# Patient Record
Sex: Female | Born: 1985 | Race: Black or African American | Hispanic: No | Marital: Single | Smoking: Never smoker
Health system: Southern US, Community
[De-identification: ages and names within clinical notes are randomized; demographics above are authoritative.]

## PROBLEM LIST (undated history)

## (undated) DIAGNOSIS — N926 Irregular menstruation, unspecified: Secondary | ICD-10-CM

## (undated) DIAGNOSIS — B009 Herpesviral infection, unspecified: Secondary | ICD-10-CM

## (undated) DIAGNOSIS — G43909 Migraine, unspecified, not intractable, without status migrainosus: Secondary | ICD-10-CM

## (undated) DIAGNOSIS — Z30017 Encounter for initial prescription of implantable subdermal contraceptive: Principal | ICD-10-CM

## (undated) DIAGNOSIS — Z789 Other specified health status: Secondary | ICD-10-CM

## (undated) DIAGNOSIS — I10 Essential (primary) hypertension: Secondary | ICD-10-CM

## (undated) DIAGNOSIS — I509 Heart failure, unspecified: Secondary | ICD-10-CM

## (undated) DIAGNOSIS — E559 Vitamin D deficiency, unspecified: Secondary | ICD-10-CM

## (undated) HISTORY — PX: NO PAST SURGERIES: SHX2092

## (undated) HISTORY — DX: Essential (primary) hypertension: I10

## (undated) HISTORY — DX: Irregular menstruation, unspecified: N92.6

## (undated) HISTORY — DX: Other specified health status: Z78.9

## (undated) HISTORY — DX: Herpesviral infection, unspecified: B00.9

## (undated) HISTORY — DX: Encounter for initial prescription of implantable subdermal contraceptive: Z30.017

## (undated) HISTORY — DX: Vitamin D deficiency, unspecified: E55.9

## (undated) HISTORY — DX: Migraine, unspecified, not intractable, without status migrainosus: G43.909

---

## 2007-05-31 ENCOUNTER — Emergency Department (HOSPITAL_COMMUNITY): Admission: EM | Admit: 2007-05-31 | Discharge: 2007-05-31 | Payer: Self-pay | Admitting: Emergency Medicine

## 2008-03-19 ENCOUNTER — Emergency Department (HOSPITAL_COMMUNITY): Admission: EM | Admit: 2008-03-19 | Discharge: 2008-03-19 | Payer: Self-pay | Admitting: Emergency Medicine

## 2008-04-08 ENCOUNTER — Other Ambulatory Visit: Admission: RE | Admit: 2008-04-08 | Discharge: 2008-04-08 | Payer: Self-pay | Admitting: Obstetrics and Gynecology

## 2008-06-01 ENCOUNTER — Inpatient Hospital Stay (HOSPITAL_COMMUNITY): Admission: EM | Admit: 2008-06-01 | Discharge: 2008-06-03 | Payer: Self-pay | Admitting: Emergency Medicine

## 2008-06-13 ENCOUNTER — Emergency Department (HOSPITAL_COMMUNITY): Admission: EM | Admit: 2008-06-13 | Discharge: 2008-06-14 | Payer: Self-pay | Admitting: Emergency Medicine

## 2008-10-12 ENCOUNTER — Other Ambulatory Visit: Payer: Self-pay | Admitting: Emergency Medicine

## 2008-10-12 ENCOUNTER — Ambulatory Visit: Payer: Self-pay | Admitting: Obstetrics & Gynecology

## 2008-10-12 ENCOUNTER — Inpatient Hospital Stay (HOSPITAL_COMMUNITY): Admission: AD | Admit: 2008-10-12 | Discharge: 2008-10-14 | Payer: Self-pay | Admitting: Obstetrics & Gynecology

## 2009-06-22 ENCOUNTER — Emergency Department (HOSPITAL_COMMUNITY): Admission: EM | Admit: 2009-06-22 | Discharge: 2009-06-22 | Payer: Self-pay | Admitting: Emergency Medicine

## 2010-07-22 ENCOUNTER — Emergency Department (HOSPITAL_COMMUNITY)
Admission: EM | Admit: 2010-07-22 | Discharge: 2010-07-22 | Payer: Self-pay | Source: Home / Self Care | Admitting: Emergency Medicine

## 2010-10-07 LAB — DIFFERENTIAL
Basophils Absolute: 0 10*3/uL (ref 0.0–0.1)
Basophils Relative: 0 % (ref 0–1)
Eosinophils Absolute: 0 10*3/uL (ref 0.0–0.7)
Eosinophils Relative: 0 % (ref 0–5)
Lymphocytes Relative: 18 % (ref 12–46)
Lymphs Abs: 2 10*3/uL (ref 0.7–4.0)
Monocytes Absolute: 0.6 10*3/uL (ref 0.1–1.0)
Monocytes Relative: 5 % (ref 3–12)
Neutro Abs: 8.5 10*3/uL — ABNORMAL HIGH (ref 1.7–7.7)
Neutrophils Relative %: 77 % (ref 43–77)

## 2010-10-07 LAB — CBC
HCT: 31.9 % — ABNORMAL LOW (ref 36.0–46.0)
HCT: 26.3 % — ABNORMAL LOW (ref 36.0–46.0)
Hemoglobin: 10.9 g/dL — ABNORMAL LOW (ref 12.0–15.0)
Hemoglobin: 8.9 g/dL — ABNORMAL LOW (ref 12.0–15.0)
MCHC: 34.2 g/dL (ref 30.0–36.0)
MCHC: 33.8 g/dL (ref 30.0–36.0)
MCV: 85.7 fL (ref 78.0–100.0)
MCV: 86.9 fL (ref 78.0–100.0)
Platelets: 133 10*3/uL — ABNORMAL LOW (ref 150–400)
Platelets: 106 10*3/uL — ABNORMAL LOW (ref 150–400)
RBC: 3.72 MIL/uL — ABNORMAL LOW (ref 3.87–5.11)
RBC: 3.02 MIL/uL — ABNORMAL LOW (ref 3.87–5.11)
RDW: 13 % (ref 11.5–15.5)
RDW: 12.7 % (ref 11.5–15.5)
WBC: 11 10*3/uL — ABNORMAL HIGH (ref 4.0–10.5)
WBC: 9.1 10*3/uL (ref 4.0–10.5)

## 2010-10-07 LAB — RPR: RPR Ser Ql: NONREACTIVE

## 2010-10-07 LAB — BASIC METABOLIC PANEL
BUN: 7 mg/dL (ref 6–23)
CO2: 19 mEq/L (ref 19–32)
Calcium: 8.5 mg/dL (ref 8.4–10.5)
Chloride: 106 mEq/L (ref 96–112)
Creatinine, Ser: 0.78 mg/dL (ref 0.4–1.2)
GFR calc Af Amer: >60 mL/min (ref >60)
GFR calc non Af Amer: >60 mL/min (ref >60)
Glucose, Bld: 96 mg/dL (ref 70–99)
Potassium: 3.4 mEq/L — ABNORMAL LOW (ref 3.5–5.1)
Sodium: 135 mEq/L (ref 135–145)

## 2010-10-07 LAB — ABO/RH: ABO/RH(D): O POS

## 2010-11-10 NOTE — H&P (Signed)
Tina, Fletcher           ACCOUNT NO.:  [id number]   MEDICAL RECORD NO.:  [id number]          PATIENT TYPE:  INP   LOCATION:  A332                          FACILITY:  [facility]   PHYSICIAN:  Tilda Burrow, M.D. DATE OF BIRTH:  1985-08-03   DATE OF ADMISSION:  06/01/2008  DATE OF DISCHARGE:  LH                              HISTORY & PHYSICAL   ADMITTING DIAGNOSES:  1. Pregnancy, 4 months.  2. Pyelonephritis.   HISTORY OF PRESENT ILLNESS:  This 25 year old female gravida 1, para 0  is admitted at present to [facility] Emergency Room complaining  of left-sided lower back pain, rated an 8/10, occurring over the past 4  days.  The patient's symptoms have been associated with chills, fever,  heartburn, and nausea.  Pregnancy course has been followed through  [facility] and notable for normal progressing pregnancy without  pain.  EDC is Nov 06, 2008.   PAST MEDICAL HISTORY:  Benign.   SURGICAL HISTORY:  Negative.   OBSTETRIC HISTORY:  Spotted x5 days early in the pregnancy.   SOCIAL HISTORY:  She is a nonsmoker and nondrinker.  No drug abuse.   ALLERGIES:  No medical allergies.   MEDICATIONS:  Prenatal vitamins and iron tablets only.   PHYSICAL EXAMINATION:  VITAL SIGNS:  Maximum temperature 99.3, pulse  105, respirations 18, and blood pressure 90/64.  GENERAL:  She is a well-developed, well-nourished, adequately hydrated  female in no acute distress.  HEENT:  Normocephalic and atraumatic.  BREASTS:  Deferred.  CHEST:  Clear to auscultation.  ABDOMEN:  Nontender.  Bowel sounds present with gravid uterus 2 cm below  the umbilicus.  BACK:  There is CVA tenderness present on the right.  EXTREMITIES:  Normal.  NEUROLOGIC:  Grossly intact.   LABORATORY DATA:  Laboratory results include blood cultures, which are  negative.  CBC; hemoglobin 8.8, hematocrit 25, and white count 9,000.  Electrolytes show BUN 4, creatinine 0.7.  Urinalysis shows specific  gravity 1.020 with urine positive for ketones, positive for nitrites as  well as large leukocyte esterase, and 1+ proteinuria.  Microscopic shows  multiple bacteria.   IMPRESSION:  1. Pyelonephritis.  2. Early intrauterine pregnancy 4 months.   PLAN:  IV Ancef 1 g q.6 h. X24-48 hours.  Anticipate the patient being  able to be discharged on June 03, 2008, if condition continues to  improve.  Currently, the patient rates pain as minimal, having improved  from last night.  Await urine and blood cultures as well, as we will  continue Ancef IV every 6 hours.      Tilda Burrow, M.D.  Electronically Signed     JVF/MEDQ  D:  06/02/2008  T:  06/02/2008  Job:  [id number]   cc:   [facility]

## 2010-11-10 NOTE — Discharge Summary (Signed)
Tina Fletcher, WINDLE           ACCOUNT NO.:  [id number]   MEDICAL RECORD NO.:  [id number]          PATIENT TYPE:  INP   LOCATION:  A332                          FACILITY:  [facility]   PHYSICIAN:  Tilda Burrow, M.D. DATE OF BIRTH:  04/16/1986   DATE OF ADMISSION:  06/01/2008  DATE OF DISCHARGE:  12/07/2009LH                               DISCHARGE SUMMARY   ADMITTING DIAGNOSES:  1. Pregnancy 4 months.  2. Right pyelonephritis.   POSTOPERATIVE DIAGNOSES:  1. Pregnancy 4 months.  2. Right pyelonephritis, resolved.   PROCEDURE:  Intravenous antibiotic therapy.   DISCHARGE MEDICATIONS:  1. Keflex 500 mg p.o. q.i.d. x1 week.  2. Macrodantin 100 mg p.o. at bedtime for duration of pregnancy.   HOSPITAL SUMMARY:  This 25 year old gravida 2, para 1 at approximately  5 weeks' gestation who is followed through [facility] is  admitted at present in the emergency room with low-grade fever and flank  pain with grossly positive urinalysis.  Blood cultures are negative.  In  24 hours, the patient has had dramatic pain relief response and is  completely pain-free at 36 hours, status post admission, and will be  discharged home on Keflex x7 days and then continuous suppression with  Macrodantin at bedtime for the duration of pregnancy.  She has had her  routine prenatal followup visit in 4 weeks scheduled and will be seeing  them in followup at the [facility].      Tilda Burrow, M.D.  Electronically Signed     JVF/MEDQ  D:  06/03/2008  T:  06/04/2008  Job:  [id number]

## 2010-11-10 NOTE — H&P (Signed)
NAMEBERNETHA, ANSCHUTZ           ACCOUNT NO.:  [id number]   MEDICAL RECORD NO.:  [id number]          PATIENT TYPE:  INP   LOCATION:  9124                          FACILITY:  [facility]   PHYSICIAN:  Lazaro Arms, M.D.   DATE OF BIRTH:  [birth year]   DATE OF ADMISSION:  10/12/2008  DATE OF DISCHARGE:                              HISTORY & PHYSICAL   Tina Fletcher is a 25 year old African American female gravida 1, para 0,  estimated date of delivery of October 14, 2008, at 39-5, who came to the  ER here at [facility], complaining of regular contractions since 6  o'clock this morning, which began progressively worse starting at 10  a.m.  She presented to the ER, I am guessing, but approximately 1:45  p.m. was found to be in advanced labor.  I was not on call, but happened  to be home and they called me at my home and I responded and came in and  I evaluated the patient.  She had a reactive NST.  She was having  regular contractions and her cervix was completely dilated with an  intact bag of water, so that an amniotomy at 14:20 with clear fluid and  the fetal heart rate tracing remained stable.   The patient took a lot to get to pushing, but once she had good maternal  expulsive effort, she pushed about 10 or 12 times and delivered a viable  female infant at 21 with Apgars of 9 and 9, weight has not been done.  EMS and [facility] were both present at the time of delivery and prepared  for transfer of the patient and the baby to [facility], delivered  over a midline episiotomy.  There was three-vessel cord.  Cord blood and  cord gas were sent.  Placenta was delivered spontaneously at 1503  intact.  The uterus was firm below the umbilicus.  She was given an IV  infusion with Pitocin.  The midline episiotomy was without extensions  and was closed with interrupted 3-0 Monocryl sutures without difficulty  and again the bleeding was appropriate.  The infant again had Apgars of  9 and 9, underwent  routine neonatal resuscitation and is doing well.  Both mother and baby will be transferred to [facility] for  postpartum and neonatal care.   PAST MEDICAL HISTORY:  Negative.   PAST SURGERY:  Negative.   PAST OB:  She is nulliparous.   ALLERGIES:  None.   MEDICATIONS:  Prenatal vitamins.   REVIEW OF SYSTEMS:  Negative.   APPROPRIATE PHYSICAL EXAMINATION:  Negative.   Blood type is O positive.  Rubella is immune.  Urine drug screen was  negative.  Varicella was immune.  Hepatitis B was negative.  HIV was  negative x2.  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  Her serology was negative.  GC and Chlamydia were negative x2.  Glucola was 133.  Group B strep was negative.  Her AFP was normal and  her last hemoglobin and hematocrit was 10 and 30 and she had iron added  as well.   IMPRESSION:  1. Advanced labor.  Presented to the [facility] Emergency Room with      resultant delivery of a midline      episiotomy.  2. Mother and baby both doing well.  Being prepared for transfer to      [facility].   The patient is stable and want to get routine postpartum care.      Lazaro Arms, M.D.  Electronically Signed     LHE/MEDQ  D:  10/12/2008  T:  10/13/2008  Job:  [id number]

## 2011-02-07 ENCOUNTER — Encounter: Payer: Self-pay | Admitting: *Deleted

## 2011-02-07 ENCOUNTER — Emergency Department (HOSPITAL_COMMUNITY)
Admission: EM | Admit: 2011-02-07 | Discharge: 2011-02-07 | Disposition: A | Discharge status: Home / Self Care | Payer: Self-pay | Attending: Emergency Medicine | Admitting: Emergency Medicine

## 2011-02-07 ENCOUNTER — Emergency Department (HOSPITAL_COMMUNITY): Payer: Self-pay

## 2011-02-07 DIAGNOSIS — N898 Other specified noninflammatory disorders of vagina: Secondary | ICD-10-CM | POA: Insufficient documentation

## 2011-02-07 DIAGNOSIS — M546 Pain in thoracic spine: Secondary | ICD-10-CM | POA: Insufficient documentation

## 2011-02-07 DIAGNOSIS — M549 Dorsalgia, unspecified: Secondary | ICD-10-CM

## 2011-02-07 DIAGNOSIS — N939 Abnormal uterine and vaginal bleeding, unspecified: Secondary | ICD-10-CM

## 2011-02-07 DIAGNOSIS — M545 Low back pain, unspecified: Secondary | ICD-10-CM | POA: Insufficient documentation

## 2011-02-07 DIAGNOSIS — M542 Cervicalgia: Secondary | ICD-10-CM | POA: Insufficient documentation

## 2011-02-07 LAB — URINALYSIS, ROUTINE W REFLEX MICROSCOPIC
Bilirubin Urine: NEGATIVE
Glucose, UA: NEGATIVE mg/dL
Hgb urine dipstick: NEGATIVE
Ketones, ur: NEGATIVE mg/dL
Leukocytes, UA: NEGATIVE
Nitrite: NEGATIVE
Protein, ur: NEGATIVE mg/dL
Specific Gravity, Urine: 1.025 (ref 1.005–1.030)
Urobilinogen, UA: 0.2 mg/dL (ref 0.0–1.0)
pH: 8 (ref 5.0–8.0)

## 2011-02-07 LAB — POCT PREGNANCY, URINE: Preg Test, Ur: NEGATIVE
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
## 2011-03-29 LAB — URINALYSIS, ROUTINE W REFLEX MICROSCOPIC
Bilirubin Urine: NEGATIVE
Glucose, UA: NEGATIVE
Hgb urine dipstick: NEGATIVE
Ketones, ur: NEGATIVE
Nitrite: POSITIVE — AB
Specific Gravity, Urine: 1.02
Urobilinogen, UA: 1
pH: 6.5

## 2011-03-29 LAB — URINE MICROSCOPIC-ADD ON

## 2011-03-29 LAB — PREGNANCY, URINE: Preg Test, Ur: POSITIVE

## 2011-04-02 LAB — URINALYSIS, ROUTINE W REFLEX MICROSCOPIC
Bilirubin Urine: NEGATIVE
Bilirubin Urine: NEGATIVE
Glucose, UA: NEGATIVE mg/dL
Glucose, UA: NEGATIVE mg/dL
Hgb urine dipstick: NEGATIVE
Hgb urine dipstick: NEGATIVE
Ketones, ur: NEGATIVE mg/dL
Nitrite: NEGATIVE
Nitrite: POSITIVE — AB
Protein, ur: 30 mg/dL — AB
Protein, ur: NEGATIVE mg/dL
Specific Gravity, Urine: 1.02 (ref 1.005–1.030)
Specific Gravity, Urine: 1.02 (ref 1.005–1.030)
Urobilinogen, UA: 0.2 mg/dL (ref 0.0–1.0)
Urobilinogen, UA: 4 mg/dL — ABNORMAL HIGH (ref 0.0–1.0)
pH: 6.5 (ref 5.0–8.0)
pH: 7 (ref 5.0–8.0)

## 2011-04-02 LAB — DIFFERENTIAL
Basophils Absolute: 0 10*3/uL (ref 0.0–0.1)
Basophils Absolute: 0 10*3/uL (ref 0.0–0.1)
Basophils Relative: 0 % (ref 0–1)
Basophils Relative: 0 % (ref 0–1)
Eosinophils Absolute: 0 10*3/uL (ref 0.0–0.7)
Eosinophils Absolute: 0 10*3/uL (ref 0.0–0.7)
Eosinophils Relative: 0 % (ref 0–5)
Eosinophils Relative: 0 % (ref 0–5)
Lymphocytes Relative: 6 % — ABNORMAL LOW (ref 12–46)
Lymphocytes Relative: 8 % — ABNORMAL LOW (ref 12–46)
Lymphs Abs: 0.6 10*3/uL — ABNORMAL LOW (ref 0.7–4.0)
Lymphs Abs: 1 10*3/uL (ref 0.7–4.0)
Monocytes Absolute: 1 10*3/uL (ref 0.1–1.0)
Monocytes Absolute: 1.1 10*3/uL — ABNORMAL HIGH (ref 0.1–1.0)
Monocytes Relative: 11 % (ref 3–12)
Monocytes Relative: 9 % (ref 3–12)
Neutro Abs: 7.4 10*3/uL (ref 1.7–7.7)
Neutro Abs: 9.6 10*3/uL — ABNORMAL HIGH (ref 1.7–7.7)
Neutrophils Relative %: 82 % — ABNORMAL HIGH (ref 43–77)
Neutrophils Relative %: 83 % — ABNORMAL HIGH (ref 43–77)

## 2011-04-02 LAB — URINE MICROSCOPIC-ADD ON

## 2011-04-02 LAB — CULTURE, BLOOD (ROUTINE X 2)
Culture: NO GROWTH
Culture: NO GROWTH
Report Status: 12102009
Report Status: 12102009

## 2011-04-02 LAB — BASIC METABOLIC PANEL
BUN: 4 mg/dL — ABNORMAL LOW (ref 6–23)
CO2: 21 mEq/L (ref 19–32)
Calcium: 9.2 mg/dL (ref 8.4–10.5)
Chloride: 102 mEq/L (ref 96–112)
Creatinine, Ser: 0.67 mg/dL (ref 0.4–1.2)
GFR calc Af Amer: >60 mL/min (ref >60)
GFR calc non Af Amer: >60 mL/min (ref >60)
Glucose, Bld: 112 mg/dL — ABNORMAL HIGH (ref 70–99)
Potassium: 3.2 mEq/L — ABNORMAL LOW (ref 3.5–5.1)
Sodium: 131 mEq/L — ABNORMAL LOW (ref 135–145)

## 2011-04-02 LAB — INFLUENZA A+B VIRUS AG-DIRECT(RAPID)
Inflenza A Ag: NEGATIVE
Influenza B Ag: NEGATIVE

## 2011-04-02 LAB — CBC
HCT: 25.9 % — ABNORMAL LOW (ref 36.0–46.0)
HCT: 28.4 % — ABNORMAL LOW (ref 36.0–46.0)
Hemoglobin: 8.8 g/dL — ABNORMAL LOW (ref 12.0–15.0)
Hemoglobin: 9.7 g/dL — ABNORMAL LOW (ref 12.0–15.0)
MCHC: 34.1 g/dL (ref 30.0–36.0)
MCHC: 34.1 g/dL (ref 30.0–36.0)
MCV: 84.3 fL (ref 78.0–100.0)
MCV: 85.3 fL (ref 78.0–100.0)
Platelets: 110 10*3/uL — ABNORMAL LOW (ref 150–400)
Platelets: 122 10*3/uL — ABNORMAL LOW (ref 150–400)
RBC: 3.04 MIL/uL — ABNORMAL LOW (ref 3.87–5.11)
RBC: 3.37 MIL/uL — ABNORMAL LOW (ref 3.87–5.11)
RDW: 13.6 % (ref 11.5–15.5)
RDW: 13.7 % (ref 11.5–15.5)
WBC: 11.6 10*3/uL — ABNORMAL HIGH (ref 4.0–10.5)
WBC: 9 10*3/uL (ref 4.0–10.5)

## 2011-04-02 LAB — URINE CULTURE
Colony Count: >=100000
Colony Count: NO GROWTH
Culture: NO GROWTH

## 2011-04-05 LAB — URINE MICROSCOPIC-ADD ON

## 2011-04-05 LAB — URINALYSIS, ROUTINE W REFLEX MICROSCOPIC
Bilirubin Urine: NEGATIVE
Glucose, UA: NEGATIVE
Ketones, ur: NEGATIVE
Leukocytes, UA: NEGATIVE
Nitrite: NEGATIVE
Specific Gravity, Urine: 1.015
Urobilinogen, UA: 0.2
pH: 7

## 2011-04-05 LAB — PREGNANCY, URINE: Preg Test, Ur: NEGATIVE

## 2011-10-08 ENCOUNTER — Emergency Department (HOSPITAL_COMMUNITY)
Admission: EM | Admit: 2011-10-08 | Discharge: 2011-10-09 | Disposition: A | Discharge status: Home / Self Care | Payer: Self-pay | Attending: Emergency Medicine | Admitting: Emergency Medicine

## 2011-10-08 ENCOUNTER — Encounter (HOSPITAL_COMMUNITY): Payer: Self-pay | Admitting: *Deleted

## 2011-10-08 DIAGNOSIS — Y92410 Unspecified street and highway as the place of occurrence of the external cause: Secondary | ICD-10-CM | POA: Insufficient documentation

## 2011-10-08 DIAGNOSIS — IMO0002 Reserved for concepts with insufficient information to code with codable children: Secondary | ICD-10-CM | POA: Insufficient documentation

## 2011-10-08 DIAGNOSIS — T1592XA Foreign body on external eye, part unspecified, left eye, initial encounter: Secondary | ICD-10-CM

## 2011-10-08 DIAGNOSIS — T1590XA Foreign body on external eye, part unspecified, unspecified eye, initial encounter: Secondary | ICD-10-CM | POA: Insufficient documentation
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
## 2011-10-09 MED ORDER — TETANUS-DIPHTH-ACELL PERTUSSIS 5-2.5-18.5 LF-MCG/0.5 IM SUSP
0.5000 mL | Freq: Once | INTRAMUSCULAR | Status: AC
  Administered 2011-10-09: 0.5 mL via INTRAMUSCULAR
  Filled 2011-10-09: qty 0.5

## 2011-10-09 MED ORDER — TOBRAMYCIN 0.3 % OP SOLN
2.0000 [drp] | Freq: Four times a day (QID) | OPHTHALMIC | Status: DC
  Administered 2011-10-09: 2 [drp] via OPHTHALMIC
  Filled 2011-10-09: qty 5

## 2011-10-09 MED ORDER — KETOROLAC TROMETHAMINE 0.5 % OP SOLN: 1.0000 [drp] | Freq: Four times a day (QID) | OPHTHALMIC | Status: DC

## 2011-10-09 MED ORDER — IBUPROFEN 800 MG PO TABS
800.0000 mg | ORAL_TABLET | Freq: Once | ORAL | Status: AC
  Administered 2011-10-09: 800 mg via ORAL
  Filled 2011-10-09: qty 1

## 2011-10-09 MED ORDER — TETRACAINE HCL 0.5 % OP SOLN
OPHTHALMIC | Status: AC
  Administered 2011-10-09: 01:00:00
  Filled 2011-10-09: qty 2

## 2011-10-09 MED ORDER — HYDROCODONE-ACETAMINOPHEN 5-325 MG PO TABS
1.0000 | ORAL_TABLET | Freq: Once | ORAL | Status: AC
  Administered 2011-10-09: 1 via ORAL
  Filled 2011-10-09: qty 1

## 2011-10-09 MED ORDER — HYDROCODONE-ACETAMINOPHEN 5-325 MG PO TABS: 1.0000 | ORAL_TABLET | Freq: Four times a day (QID) | ORAL | Status: AC | PRN

## 2011-10-09 NOTE — Discharge Instructions (Signed)
Eye, Foreign Body A foreign body is an object that should not be there. The object could be near, on, or in the eye. HOME CARE If your doctor prescribes an eye patch:  Keep the eye patch on. Do this until you see your doctor again.   Do not remove the patch to put in medicine unless your doctor tells you.   Retape it as it was before:   When replacing the patch.   If the patch comes loose.   Do not drive or use machinery.   Only take medicine as told by your doctor.  If your doctor does not prescribe an eye patch:  Keep the eye closed as much as possible.   Do not rub the eye.   Wear dark glasses in bright light.   Do not wear contact lenses until the eye feels normal, or as told by your doctor.   Wear protective eye covering, especially when using high speed tools.   Only take medicine as told by your doctor.  GET HELP RIGHT AWAY IF:   Your pain gets worse.   Your vision changes.   You have problems with the eye patch.   The injury gets larger.   There is fluid (discharge) coming from the eye.   You get puffiness (swelling) and soreness.   You have an oral temperature above 102 F (38.9 C), not controlled by medicine.   Your baby is older than 3 months with a rectal temperature of 102 F (38.9 C) or higher.   Your baby is 28 months old or younger with a rectal temperature of 100.4 F (38 C) or higher.  MAKE SURE YOU:   Understand these instructions.   Will watch your condition.   Will get help right away if you are not doing well or get worse.  Document Released: 12/02/2009 Document Revised: 06/03/2011 Document Reviewed: 12/02/2009 [facility] Patient Information 2012 [place], [place].   Take the pain medicine as directed.  taake ibuprofen 800 mg every 8 hrs with food.  Insert 2 drops of tobrex in left eye 4 times daily.  Insert 2 drops of acular  In left eye 4 times daily as needed for pain.  Call dr. Lita Mains and make an appt for further evaluation.

## 2011-10-11 NOTE — ED Provider Notes (Signed)
Medical screening examination/treatment/procedure(s) were performed by non-physician practitioner and as supervising physician I was immediately available for consultation/collaboration.  Nhat Hearne S. Arkel Cartwright, MD 10/11/11 1117 

## 2013-01-02 ENCOUNTER — Other Ambulatory Visit: Payer: Self-pay | Admitting: Obstetrics & Gynecology

## 2013-01-02 DIAGNOSIS — O3680X0 Pregnancy with inconclusive fetal viability, not applicable or unspecified: Secondary | ICD-10-CM

## 2013-01-03 ENCOUNTER — Ambulatory Visit (INDEPENDENT_AMBULATORY_CARE_PROVIDER_SITE_OTHER): Payer: Medicaid Other

## 2013-01-03 ENCOUNTER — Other Ambulatory Visit: Payer: Self-pay | Admitting: Obstetrics & Gynecology

## 2013-01-03 DIAGNOSIS — O0932 Supervision of pregnancy with insufficient antenatal care, second trimester: Secondary | ICD-10-CM

## 2013-01-03 DIAGNOSIS — O3680X0 Pregnancy with inconclusive fetal viability, not applicable or unspecified: Secondary | ICD-10-CM

## 2013-01-03 DIAGNOSIS — O0930 Supervision of pregnancy with insufficient antenatal care, unspecified trimester: Secondary | ICD-10-CM

## 2013-01-03 NOTE — Progress Notes (Signed)
Single, living, iup,MEAS @ 17W 4D WITH EDD 06/09/2013., CX 4.0 CM, BILAT OVS SEEN, RO W/ C.L., ANT. PLAC

## 2013-01-08 ENCOUNTER — Other Ambulatory Visit: Payer: Self-pay | Admitting: Women's Health

## 2013-01-08 ENCOUNTER — Ambulatory Visit (INDEPENDENT_AMBULATORY_CARE_PROVIDER_SITE_OTHER): Payer: Medicaid Other | Admitting: Women's Health

## 2013-01-08 ENCOUNTER — Encounter: Payer: Self-pay | Admitting: Women's Health

## 2013-01-08 ENCOUNTER — Other Ambulatory Visit (HOSPITAL_COMMUNITY)
Admission: RE | Admit: 2013-01-08 | Discharge: 2013-01-08 | Disposition: A | Discharge status: Home / Self Care | Payer: Medicaid Other | Source: Ambulatory Visit | Attending: Obstetrics & Gynecology | Admitting: Obstetrics & Gynecology

## 2013-01-08 VITALS — BP 80/60 | Wt 128.0 lb

## 2013-01-08 DIAGNOSIS — Z113 Encounter for screening for infections with a predominantly sexual mode of transmission: Secondary | ICD-10-CM | POA: Insufficient documentation

## 2013-01-08 DIAGNOSIS — Z3482 Encounter for supervision of other normal pregnancy, second trimester: Secondary | ICD-10-CM

## 2013-01-08 DIAGNOSIS — O99019 Anemia complicating pregnancy, unspecified trimester: Secondary | ICD-10-CM

## 2013-01-08 DIAGNOSIS — O98519 Other viral diseases complicating pregnancy, unspecified trimester: Secondary | ICD-10-CM

## 2013-01-08 DIAGNOSIS — O0930 Supervision of pregnancy with insufficient antenatal care, unspecified trimester: Secondary | ICD-10-CM

## 2013-01-08 DIAGNOSIS — Z3480 Encounter for supervision of other normal pregnancy, unspecified trimester: Secondary | ICD-10-CM | POA: Insufficient documentation

## 2013-01-08 DIAGNOSIS — Z1389 Encounter for screening for other disorder: Secondary | ICD-10-CM

## 2013-01-08 DIAGNOSIS — R636 Underweight: Secondary | ICD-10-CM

## 2013-01-08 DIAGNOSIS — Z01419 Encounter for gynecological examination (general) (routine) without abnormal findings: Secondary | ICD-10-CM | POA: Insufficient documentation

## 2013-01-08 DIAGNOSIS — Z331 Pregnant state, incidental: Secondary | ICD-10-CM

## 2013-01-08 DIAGNOSIS — R768 Other specified abnormal immunological findings in serum: Secondary | ICD-10-CM | POA: Insufficient documentation

## 2013-01-08 LAB — POCT URINALYSIS DIPSTICK
Blood, UA: NEGATIVE
Glucose, UA: NEGATIVE
Ketones, UA: NEGATIVE
Nitrite, UA: NEGATIVE
Protein, UA: NEGATIVE

## 2013-01-08 LAB — CBC
HCT: 31.9 % — ABNORMAL LOW (ref 36.0–46.0)
Hemoglobin: 10.7 g/dL — ABNORMAL LOW (ref 12.0–15.0)
MCH: 28.3 pg (ref 26.0–34.0)
MCHC: 33.5 g/dL (ref 30.0–36.0)
MCV: 84.4 fL (ref 78.0–100.0)
Platelets: 145 10*3/uL — ABNORMAL LOW (ref 150–400)
RBC: 3.78 MIL/uL — ABNORMAL LOW (ref 3.87–5.11)
RDW: 14.2 % (ref 11.5–15.5)
WBC: 5.7 10*3/uL (ref 4.0–10.5)

## 2013-01-08 NOTE — Patient Instructions (Signed)
Pregnancy - Second Trimester The second trimester of pregnancy (3 to 6 months) is a period of rapid growth for you and your baby. At the end of the sixth month, your baby is about 9 inches long and weighs 1 1/2 pounds. You will begin to feel the baby move between 18 and 20 weeks of the pregnancy. This is called quickening. Weight gain is faster. A clear fluid (colostrum) may leak out of your breasts. You may feel small contractions of the womb (uterus). This is known as false labor or Braxton-Hicks contractions. This is like a practice for labor when the baby is ready to be born. Usually, the problems with morning sickness have usually passed by the end of your first trimester. Some women develop small dark blotches (called cholasma, mask of pregnancy) on their face that usually goes away after the baby is born. Exposure to the sun makes the blotches worse. Acne may also develop in some pregnant women and pregnant women who have acne, may find that it goes away. PRENATAL EXAMS  Blood work may continue to be done during prenatal exams. These tests are done to check on your health and the probable health of your baby. Blood work is used to follow your blood levels (hemoglobin). Anemia (low hemoglobin) is common during pregnancy. Iron and vitamins are given to help prevent this. You will also be checked for diabetes between 24 and 28 weeks of the pregnancy. Some of the previous blood tests may be repeated.  The size of the uterus is measured during each visit. This is to make sure that the baby is continuing to grow properly according to the dates of the pregnancy.  Your blood pressure is checked every prenatal visit. This is to make sure you are not getting toxemia.  Your urine is checked to make sure you do not have an infection, diabetes or protein in the urine.  Your weight is checked often to make sure gains are happening at the suggested rate. This is to ensure that both you and your baby are  growing normally.  Sometimes, an ultrasound is performed to confirm the proper growth and development of the baby. This is a test which bounces harmless sound waves off the baby so your caregiver can more accurately determine due dates. Sometimes, a test is done on the amniotic fluid surrounding the baby. This test is called an amniocentesis. The amniotic fluid is obtained by sticking a needle into the belly (abdomen). This is done to check the chromosomes in instances where there is a concern about possible genetic problems with the baby. It is also sometimes done near the end of pregnancy if an early delivery is required. In this case, it is done to help make sure the baby's lungs are mature enough for the baby to live outside of the womb. CHANGES OCCURING IN THE SECOND TRIMESTER OF PREGNANCY Your body goes through many changes during pregnancy. They vary from person to person. Talk to your caregiver about changes you notice that you are concerned about.  During the second trimester, you will likely have an increase in your appetite. It is normal to have cravings for certain foods. This varies from person to person and pregnancy to pregnancy.  Your lower abdomen will begin to bulge.  You may have to urinate more often because the uterus and baby are pressing on your bladder. It is also common to get more bladder infections during pregnancy. You can help this by drinking lots of fluids   and emptying your bladder before and after intercourse.  You may begin to get stretch marks on your hips, abdomen, and breasts. These are normal changes in the body during pregnancy. There are no exercises or medicines to take that prevent this change.  You may begin to develop swollen and bulging veins (varicose veins) in your legs. Wearing support hose, elevating your feet for 15 minutes, 3 to 4 times a day and limiting salt in your diet helps lessen the problem.  Heartburn may develop as the uterus grows and  pushes up against the stomach. Antacids recommended by your caregiver helps with this problem. Also, eating smaller meals 4 to 5 times a day helps.  Constipation can be treated with a stool softener or adding bulk to your diet. Drinking lots of fluids, and eating vegetables, fruits, and whole grains are helpful.  Exercising is also helpful. If you have been very active up until your pregnancy, most of these activities can be continued during your pregnancy. If you have been less active, it is helpful to start an exercise program such as walking.  Hemorrhoids may develop at the end of the second trimester. Warm sitz baths and hemorrhoid cream recommended by your caregiver helps hemorrhoid problems.  Backaches may develop during this time of your pregnancy. Avoid heavy lifting, wear low heal shoes, and practice good posture to help with backache problems.  Some pregnant women develop tingling and numbness of their hand and fingers because of swelling and tightening of ligaments in the wrist (carpel tunnel syndrome). This goes away after the baby is born.  As your breasts enlarge, you may have to get a bigger bra. Get a comfortable, cotton, support bra. Do not get a nursing bra until the last month of the pregnancy if you will be nursing the baby.  You may get a dark line from your belly button to the pubic area called the linea nigra.  You may develop rosy cheeks because of increase blood flow to the face.  You may develop spider looking lines of the face, neck, arms, and chest. These go away after the baby is born. HOME CARE INSTRUCTIONS   It is extremely important to avoid all smoking, herbs, alcohol, and unprescribed drugs during your pregnancy. These chemicals affect the formation and growth of the baby. Avoid these chemicals throughout the pregnancy to ensure the delivery of a healthy infant.  Most of your home care instructions are the same as suggested for the first trimester of your  pregnancy. Keep your caregiver's appointments. Follow your caregiver's instructions regarding medicine use, exercise, and diet.  During pregnancy, you are providing food for you and your baby. Continue to eat regular, well-balanced meals. Choose foods such as meat, fish, milk and other low fat dairy products, vegetables, fruits, and whole-grain breads and cereals. Your caregiver will tell you of the ideal weight gain.  A physical sexual relationship may be continued up until near the end of pregnancy if there are no other problems. Problems could include early (premature) leaking of amniotic fluid from the membranes, vaginal bleeding, abdominal pain, or other medical or pregnancy problems.  Exercise regularly if there are no restrictions. Check with your caregiver if you are unsure of the safety of some of your exercises. The greatest weight gain will occur in the last 2 trimesters of pregnancy. Exercise will help you:  Control your weight.  Get you in shape for labor and delivery.  Lose weight after you have the baby.  Wear   a good support or jogging bra for breast tenderness during pregnancy. This may help if worn during sleep. Pads or tissues may be used in the bra if you are leaking colostrum.  Do not use hot tubs, steam rooms or saunas throughout the pregnancy.  Wear your seat belt at all times when driving. This protects you and your baby if you are in an accident.  Avoid raw meat, uncooked cheese, cat litter boxes, and soil used by cats. These carry germs that can cause birth defects in the baby.  The second trimester is also a good time to visit your dentist for your dental health if this has not been done yet. Getting your teeth cleaned is okay. Use a soft toothbrush. Brush gently during pregnancy.  It is easier to leak urine during pregnancy. Tightening up and strengthening the pelvic muscles will help with this problem. Practice stopping your urination while you are going to the  bathroom. These are the same muscles you need to strengthen. It is also the muscles you would use as if you were trying to stop from passing gas. You can practice tightening these muscles up 10 times a set and repeating this about 3 times per day. Once you know what muscles to tighten up, do not perform these exercises during urination. It is more likely to contribute to an infection by backing up the urine.  Ask for help if you have financial, counseling, or nutritional needs during pregnancy. Your caregiver will be able to offer counseling for these needs as well as refer you for other special needs.  Your skin may become oily. If so, wash your face with mild soap, use non-greasy moisturizer and oil or cream based makeup. MEDICINES AND DRUG USE IN PREGNANCY  Take prenatal vitamins as directed. The vitamin should contain 1 milligram of folic acid. Keep all vitamins out of reach of children. Only a couple vitamins or tablets containing iron may be fatal to a baby or young child when ingested.  Avoid use of all medicines, including herbs, over-the-counter medicines, not prescribed or suggested by your caregiver. Only take over-the-counter or prescription medicines for pain, discomfort, or fever as directed by your caregiver. Do not use aspirin.  Let your caregiver also know about herbs you may be using.  Alcohol is related to a number of birth defects. This includes fetal alcohol syndrome. All alcohol, in any form, should be avoided completely. Smoking will cause low birth rate and premature babies.  Street or illegal drugs are very harmful to the baby. They are absolutely forbidden. A baby born to an addicted mother will be addicted at birth. The baby will go through the same withdrawal an adult does. SEEK MEDICAL CARE IF:  You have any concerns or worries during your pregnancy. It is better to call with your questions if you feel they cannot wait, rather than worry about them. SEEK IMMEDIATE  MEDICAL CARE IF:   An unexplained oral temperature above 102 F (38.9 C) develops, or as your caregiver suggests.  You have leaking of fluid from the vagina (birth canal). If leaking membranes are suspected, take your temperature and tell your caregiver of this when you call.  There is vaginal spotting, bleeding, or passing clots. Tell your caregiver of the amount and how many pads are used. Light spotting in pregnancy is common, especially following intercourse.  You develop a bad smelling vaginal discharge with a change in the color from clear to white.  You continue to feel   sick to your stomach (nauseated) and have no relief from remedies suggested. You vomit blood or coffee ground-like materials.  You lose more than 2 pounds of weight or gain more than 2 pounds of weight over 1 week, or as suggested by your caregiver.  You notice swelling of your face, hands, feet, or legs.  You get exposed to German measles and have never had them.  You are exposed to fifth disease or chickenpox.  You develop belly (abdominal) pain. Round ligament discomfort is a common non-cancerous (benign) cause of abdominal pain in pregnancy. Your caregiver still must evaluate you.  You develop a bad headache that does not go away.  You develop fever, diarrhea, pain with urination, or shortness of breath.  You develop visual problems, blurry, or double vision.  You fall or are in a car accident or any kind of trauma.  There is mental or physical violence at home. Document Released: 06/08/2001 Document Revised: 03/08/2012 Document Reviewed: 12/11/2008 [employer] Patient Information 2014 [employer].  

## 2013-01-08 NOTE — Progress Notes (Signed)
  Subjective:    Tina Fletcher is a 27 y.o. G55P1001 African American female at [redacted]w[redacted]d by by 17.4wk u/s, being seen today for her first obstetrical visit.  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  Pregnancy history fully reviewed. Taking pnv.   Patient reports occasional upper abdominal cramping. Denies n/v/d, uti s/s, h/o herpes outbreak.  Filed Vitals:   01/08/13 1104  BP: 80/60  Weight: 128 lb (58.06 kg)    HISTORY: OB History   Grav Para Term Preterm Abortions TAB SAB Ect Mult Living   2 1 1       1      # Outc Date GA Lbr Len/2nd Wgt Sex Del Anes PTL Lv   1 TRM 4/10 [redacted]w[redacted]d  6lb15oz(3.147kg) M SVD None  Yes   Comments: delivered at [facility]- Dr. Despina Hidden was called in   2 CUR              Past Medical History  Diagnosis Date  . Medical history non-contributory    Past Surgical History  Procedure Laterality Date  . No past surgeries     Family History  Problem Relation Age of Onset  . Diabetes Maternal Grandmother      Exam    Pelvic Exam:    Perineum: Normal Perineum   Vulva: normal   Vagina:  normal mucosa, normal discharge, no palpable nodules   Uterus   app 18wk size     Cervix: normal   Adnexa: Not palpable   Urinary: urethral meatus normal    System:     Skin: normal coloration and turgor, no rashes    Neurologic: oriented, normal mood   Extremities: normal strength, tone, and muscle mass   HEENT PERRLA   Mouth/Teeth mucous membranes moist   Cardiovascular: regular rate and rhythm   Respiratory:  appears well, vitals normal, no respiratory distress, acyanotic, normal RR   Abdomen: soft, non-tender    Thin prep pap smear obtained    Assessment:    Pregnancy: G2P1001 Patient Active Problem List   Diagnosis Date Noted  . Supervision of other normal pregnancy 01/08/2013    Priority: High  . 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

## 2013-01-08 NOTE — Progress Notes (Signed)
Cramps in stomach. New OB packet given. Consents signed.

## 2013-01-09 ENCOUNTER — Encounter: Payer: Self-pay | Admitting: Women's Health

## 2013-01-09 LAB — ABO AND RH: Rh Type: POSITIVE

## 2013-01-09 LAB — AFP, QUAD SCREEN
AFP: 74.4 [IU]/mL
Age Alone: 1:1030 {titer}
Curr Gest Age: 18.2 wks.days
Down Syndrome Scr Risk Est: <1:38500 {titer}
HCG, Total: 20713 m[IU]/mL
INH: 164.3 pg/mL
Interpretation-AFP: NEGATIVE
MoM for AFP: 1.53
MoM for INH: 0.88
MoM for hCG: 1
Open Spina bifida: NEGATIVE
Osb Risk: 1:5160 {titer}
Tri 18 Scr Risk Est: NEGATIVE
Trisomy 18 (Edward) Syndrome Interp.: 1:137000 {titer}
uE3 Mom: 1.2
uE3 Value: 1.1 ng/mL

## 2013-01-09 LAB — DRUG SCREEN, URINE, NO CONFIRMATION
Amphetamine Screen, Ur: NEGATIVE
Barbiturate Quant, Ur: NEGATIVE
Benzodiazepines.: NEGATIVE
Cocaine Metabolites: NEGATIVE
Creatinine,U: 233.6 mg/dL
Marijuana Metabolite: NEGATIVE
Methadone: NEGATIVE
Opiate Screen, Urine: NEGATIVE
Phencyclidine (PCP): NEGATIVE
Propoxyphene: NEGATIVE

## 2013-01-09 LAB — URINALYSIS
Bilirubin Urine: NEGATIVE
Glucose, UA: NEGATIVE mg/dL
Hgb urine dipstick: NEGATIVE
Ketones, ur: NEGATIVE mg/dL
Nitrite: NEGATIVE
Protein, ur: NEGATIVE mg/dL
Specific Gravity, Urine: 1.019 (ref 1.005–1.030)
Urobilinogen, UA: 0.2 mg/dL (ref 0.0–1.0)
pH: 7 (ref 5.0–8.0)

## 2013-01-09 LAB — HEPATITIS B SURFACE ANTIGEN: Hepatitis B Surface Ag: NEGATIVE

## 2013-01-09 LAB — ANTIBODY SCREEN: Antibody Screen: NEGATIVE

## 2013-01-09 LAB — VARICELLA ZOSTER ANTIBODY, IGG: Varicella IgG: 727.3 {index} — ABNORMAL HIGH (ref <135.00)

## 2013-01-09 LAB — HIV ANTIBODY (ROUTINE TESTING W REFLEX): HIV: NONREACTIVE

## 2013-01-09 LAB — RPR

## 2013-01-09 LAB — RUBELLA SCREEN: Rubella: 1.88 {index} — ABNORMAL HIGH (ref <0.90)

## 2013-01-09 LAB — SICKLE CELL SCREEN: Sickle Cell Screen: NEGATIVE

## 2013-01-09 LAB — OXYCODONE SCREEN, UA, RFLX CONFIRM: Oxycodone Screen, Ur: NEGATIVE ng/mL

## 2013-01-10 LAB — CYSTIC FIBROSIS DIAGNOSTIC STUDY

## 2013-01-11 ENCOUNTER — Encounter: Payer: Self-pay | Admitting: Women's Health

## 2013-01-11 LAB — URINE CULTURE
Colony Count: NO GROWTH
Organism ID, Bacteria: NO GROWTH

## 2013-01-24 ENCOUNTER — Encounter: Payer: Self-pay | Admitting: Advanced Practice Midwife

## 2013-01-24 ENCOUNTER — Other Ambulatory Visit: Payer: Self-pay

## 2013-01-31 ENCOUNTER — Encounter: Payer: Self-pay | Admitting: Obstetrics & Gynecology

## 2013-01-31 ENCOUNTER — Ambulatory Visit (INDEPENDENT_AMBULATORY_CARE_PROVIDER_SITE_OTHER): Payer: Medicaid Other

## 2013-01-31 ENCOUNTER — Ambulatory Visit (INDEPENDENT_AMBULATORY_CARE_PROVIDER_SITE_OTHER): Payer: Medicaid Other | Admitting: Obstetrics & Gynecology

## 2013-01-31 VITALS — BP 90/60 | Wt 129.0 lb

## 2013-01-31 DIAGNOSIS — O98519 Other viral diseases complicating pregnancy, unspecified trimester: Secondary | ICD-10-CM

## 2013-01-31 DIAGNOSIS — O0930 Supervision of pregnancy with insufficient antenatal care, unspecified trimester: Secondary | ICD-10-CM

## 2013-01-31 DIAGNOSIS — Z331 Pregnant state, incidental: Secondary | ICD-10-CM

## 2013-01-31 DIAGNOSIS — Z3482 Encounter for supervision of other normal pregnancy, second trimester: Secondary | ICD-10-CM

## 2013-01-31 DIAGNOSIS — Z1389 Encounter for screening for other disorder: Secondary | ICD-10-CM

## 2013-01-31 DIAGNOSIS — O99019 Anemia complicating pregnancy, unspecified trimester: Secondary | ICD-10-CM

## 2013-01-31 LAB — POCT URINALYSIS DIPSTICK
Blood, UA: NEGATIVE
Glucose, UA: NEGATIVE
Ketones, UA: NEGATIVE
Nitrite, UA: NEGATIVE
Protein, UA: NEGATIVE

## 2013-01-31 NOTE — Progress Notes (Signed)
HAD U/S today. 

## 2013-01-31 NOTE — Progress Notes (Signed)
sonogram normal see report BP weight and urine results all reviewed and noted. Patient reports good fetal movement, denies any bleeding and no rupture of membranes symptoms or regular contractions. Patient is without complaints. All questions were answered.

## 2013-01-31 NOTE — Patient Instructions (Signed)
Pregnancy - Second Trimester The second trimester of pregnancy (3 to 6 months) is a period of rapid growth for you and your baby. At the end of the sixth month, your baby is about 9 inches long and weighs 1 1/2 pounds. You will begin to feel the baby move between 18 and 20 weeks of the pregnancy. This is called quickening. Weight gain is faster. A clear fluid (colostrum) may leak out of your breasts. You may feel small contractions of the womb (uterus). This is known as false labor or Braxton-Hicks contractions. This is like a practice for labor when the baby is ready to be born. Usually, the problems with morning sickness have usually passed by the end of your first trimester. Some women develop small dark blotches (called cholasma, mask of pregnancy) on their face that usually goes away after the baby is born. Exposure to the sun makes the blotches worse. Acne may also develop in some pregnant women and pregnant women who have acne, may find that it goes away. PRENATAL EXAMS  Blood work may continue to be done during prenatal exams. These tests are done to check on your health and the probable health of your baby. Blood work is used to follow your blood levels (hemoglobin). Anemia (low hemoglobin) is common during pregnancy. Iron and vitamins are given to help prevent this. You will also be checked for diabetes between 24 and 28 weeks of the pregnancy. Some of the previous blood tests may be repeated.  The size of the uterus is measured during each visit. This is to make sure that the baby is continuing to grow properly according to the dates of the pregnancy.  Your blood pressure is checked every prenatal visit. This is to make sure you are not getting toxemia.  Your urine is checked to make sure you do not have an infection, diabetes or protein in the urine.  Your weight is checked often to make sure gains are happening at the suggested rate. This is to ensure that both you and your baby are  growing normally.  Sometimes, an ultrasound is performed to confirm the proper growth and development of the baby. This is a test which bounces harmless sound waves off the baby so your caregiver can more accurately determine due dates. Sometimes, a test is done on the amniotic fluid surrounding the baby. This test is called an amniocentesis. The amniotic fluid is obtained by sticking a needle into the belly (abdomen). This is done to check the chromosomes in instances where there is a concern about possible genetic problems with the baby. It is also sometimes done near the end of pregnancy if an early delivery is required. In this case, it is done to help make sure the baby's lungs are mature enough for the baby to live outside of the womb. CHANGES OCCURING IN THE SECOND TRIMESTER OF PREGNANCY Your body goes through many changes during pregnancy. They vary from person to person. Talk to your caregiver about changes you notice that you are concerned about.  During the second trimester, you will likely have an increase in your appetite. It is normal to have cravings for certain foods. This varies from person to person and pregnancy to pregnancy.  Your lower abdomen will begin to bulge.  You may have to urinate more often because the uterus and baby are pressing on your bladder. It is also common to get more bladder infections during pregnancy. You can help this by drinking lots of fluids   and emptying your bladder before and after intercourse.  You may begin to get stretch marks on your hips, abdomen, and breasts. These are normal changes in the body during pregnancy. There are no exercises or medicines to take that prevent this change.  You may begin to develop swollen and bulging veins (varicose veins) in your legs. Wearing support hose, elevating your feet for 15 minutes, 3 to 4 times a day and limiting salt in your diet helps lessen the problem.  Heartburn may develop as the uterus grows and  pushes up against the stomach. Antacids recommended by your caregiver helps with this problem. Also, eating smaller meals 4 to 5 times a day helps.  Constipation can be treated with a stool softener or adding bulk to your diet. Drinking lots of fluids, and eating vegetables, fruits, and whole grains are helpful.  Exercising is also helpful. If you have been very active up until your pregnancy, most of these activities can be continued during your pregnancy. If you have been less active, it is helpful to start an exercise program such as walking.  Hemorrhoids may develop at the end of the second trimester. Warm sitz baths and hemorrhoid cream recommended by your caregiver helps hemorrhoid problems.  Backaches may develop during this time of your pregnancy. Avoid heavy lifting, wear low heal shoes, and practice good posture to help with backache problems.  Some pregnant women develop tingling and numbness of their hand and fingers because of swelling and tightening of ligaments in the wrist (carpel tunnel syndrome). This goes away after the baby is born.  As your breasts enlarge, you may have to get a bigger bra. Get a comfortable, cotton, support bra. Do not get a nursing bra until the last month of the pregnancy if you will be nursing the baby.  You may get a dark line from your belly button to the pubic area called the linea nigra.  You may develop rosy cheeks because of increase blood flow to the face.  You may develop spider looking lines of the face, neck, arms, and chest. These go away after the baby is born. HOME CARE INSTRUCTIONS   It is extremely important to avoid all smoking, herbs, alcohol, and unprescribed drugs during your pregnancy. These chemicals affect the formation and growth of the baby. Avoid these chemicals throughout the pregnancy to ensure the delivery of a healthy infant.  Most of your home care instructions are the same as suggested for the first trimester of your  pregnancy. Keep your caregiver's appointments. Follow your caregiver's instructions regarding medicine use, exercise, and diet.  During pregnancy, you are providing food for you and your baby. Continue to eat regular, well-balanced meals. Choose foods such as meat, fish, milk and other low fat dairy products, vegetables, fruits, and whole-grain breads and cereals. Your caregiver will tell you of the ideal weight gain.  A physical sexual relationship may be continued up until near the end of pregnancy if there are no other problems. Problems could include early (premature) leaking of amniotic fluid from the membranes, vaginal bleeding, abdominal pain, or other medical or pregnancy problems.  Exercise regularly if there are no restrictions. Check with your caregiver if you are unsure of the safety of some of your exercises. The greatest weight gain will occur in the last 2 trimesters of pregnancy. Exercise will help you:  Control your weight.  Get you in shape for labor and delivery.  Lose weight after you have the baby.  Wear   a good support or jogging bra for breast tenderness during pregnancy. This may help if worn during sleep. Pads or tissues may be used in the bra if you are leaking colostrum.  Do not use hot tubs, steam rooms or saunas throughout the pregnancy.  Wear your seat belt at all times when driving. This protects you and your baby if you are in an accident.  Avoid raw meat, uncooked cheese, cat litter boxes, and soil used by cats. These carry germs that can cause birth defects in the baby.  The second trimester is also a good time to visit your dentist for your dental health if this has not been done yet. Getting your teeth cleaned is okay. Use a soft toothbrush. Brush gently during pregnancy.  It is easier to leak urine during pregnancy. Tightening up and strengthening the pelvic muscles will help with this problem. Practice stopping your urination while you are going to the  bathroom. These are the same muscles you need to strengthen. It is also the muscles you would use as if you were trying to stop from passing gas. You can practice tightening these muscles up 10 times a set and repeating this about 3 times per day. Once you know what muscles to tighten up, do not perform these exercises during urination. It is more likely to contribute to an infection by backing up the urine.  Ask for help if you have financial, counseling, or nutritional needs during pregnancy. Your caregiver will be able to offer counseling for these needs as well as refer you for other special needs.  Your skin may become oily. If so, wash your face with mild soap, use non-greasy moisturizer and oil or cream based makeup. MEDICINES AND DRUG USE IN PREGNANCY  Take prenatal vitamins as directed. The vitamin should contain 1 milligram of folic acid. Keep all vitamins out of reach of children. Only a couple vitamins or tablets containing iron may be fatal to a baby or young child when ingested.  Avoid use of all medicines, including herbs, over-the-counter medicines, not prescribed or suggested by your caregiver. Only take over-the-counter or prescription medicines for pain, discomfort, or fever as directed by your caregiver. Do not use aspirin.  Let your caregiver also know about herbs you may be using.  Alcohol is related to a number of birth defects. This includes fetal alcohol syndrome. All alcohol, in any form, should be avoided completely. Smoking will cause low birth rate and premature babies.  Street or illegal drugs are very harmful to the baby. They are absolutely forbidden. A baby born to an addicted mother will be addicted at birth. The baby will go through the same withdrawal an adult does. SEEK MEDICAL CARE IF:  You have any concerns or worries during your pregnancy. It is better to call with your questions if you feel they cannot wait, rather than worry about them. SEEK IMMEDIATE  MEDICAL CARE IF:   An unexplained oral temperature above 102 F (38.9 C) develops, or as your caregiver suggests.  You have leaking of fluid from the vagina (birth canal). If leaking membranes are suspected, take your temperature and tell your caregiver of this when you call.  There is vaginal spotting, bleeding, or passing clots. Tell your caregiver of the amount and how many pads are used. Light spotting in pregnancy is common, especially following intercourse.  You develop a bad smelling vaginal discharge with a change in the color from clear to white.  You continue to feel   sick to your stomach (nauseated) and have no relief from remedies suggested. You vomit blood or coffee ground-like materials.  You lose more than 2 pounds of weight or gain more than 2 pounds of weight over 1 week, or as suggested by your caregiver.  You notice swelling of your face, hands, feet, or legs.  You get exposed to German measles and have never had them.  You are exposed to fifth disease or chickenpox.  You develop belly (abdominal) pain. Round ligament discomfort is a common non-cancerous (benign) cause of abdominal pain in pregnancy. Your caregiver still must evaluate you.  You develop a bad headache that does not go away.  You develop fever, diarrhea, pain with urination, or shortness of breath.  You develop visual problems, blurry, or double vision.  You fall or are in a car accident or any kind of trauma.  There is mental or physical violence at home. Document Released: 06/08/2001 Document Revised: 03/08/2012 Document Reviewed: 12/11/2008 [employer] Patient Information 2014 [employer].  

## 2013-01-31 NOTE — Progress Notes (Signed)
U/S(21+4wks)-active fetus, meas c/w dates, fluid wnl, ant gr 0 plac, cx long and closed 3.3cm, no major abnl noted, female fetus, RT ov with 2.2 x 2.0cm cystic area and 8mm hyperechoic area noted

## 2013-02-28 ENCOUNTER — Encounter: Payer: Medicaid Other | Admitting: Obstetrics & Gynecology

## 2013-03-27 ENCOUNTER — Ambulatory Visit (INDEPENDENT_AMBULATORY_CARE_PROVIDER_SITE_OTHER): Payer: Medicaid Other | Admitting: Obstetrics & Gynecology

## 2013-03-27 ENCOUNTER — Encounter: Payer: Self-pay | Admitting: Obstetrics & Gynecology

## 2013-03-27 VITALS — BP 96/62 | Wt 139.5 lb

## 2013-03-27 DIAGNOSIS — Z1389 Encounter for screening for other disorder: Secondary | ICD-10-CM

## 2013-03-27 DIAGNOSIS — O99019 Anemia complicating pregnancy, unspecified trimester: Secondary | ICD-10-CM

## 2013-03-27 DIAGNOSIS — O0930 Supervision of pregnancy with insufficient antenatal care, unspecified trimester: Secondary | ICD-10-CM

## 2013-03-27 DIAGNOSIS — Z331 Pregnant state, incidental: Secondary | ICD-10-CM

## 2013-03-27 DIAGNOSIS — O98519 Other viral diseases complicating pregnancy, unspecified trimester: Secondary | ICD-10-CM

## 2013-03-27 LAB — POCT URINALYSIS DIPSTICK
Blood, UA: NEGATIVE
Glucose, UA: NEGATIVE
Ketones, UA: NEGATIVE
Nitrite, UA: NEGATIVE
Protein, UA: NEGATIVE

## 2013-03-27 MED ORDER — OMEPRAZOLE 20 MG PO CPDR: 20.0000 mg | DELAYED_RELEASE_CAPSULE | Freq: Every day | ORAL | Status: DC

## 2013-03-27 NOTE — Progress Notes (Signed)
GERD  Prilosec 20 daily BP weight and urine results all reviewed and noted. Patient reports good fetal movement, denies any bleeding and no rupture of membranes symptoms or regular contractions. Patient is without complaints. All questions were answered.

## 2013-04-03 ENCOUNTER — Other Ambulatory Visit: Payer: Medicaid Other

## 2013-04-03 ENCOUNTER — Encounter: Payer: Self-pay | Admitting: Obstetrics & Gynecology

## 2013-04-03 ENCOUNTER — Ambulatory Visit (INDEPENDENT_AMBULATORY_CARE_PROVIDER_SITE_OTHER): Payer: Medicaid Other | Admitting: Obstetrics & Gynecology

## 2013-04-03 VITALS — BP 100/60 | Wt 141.0 lb

## 2013-04-03 DIAGNOSIS — O99019 Anemia complicating pregnancy, unspecified trimester: Secondary | ICD-10-CM

## 2013-04-03 DIAGNOSIS — O0930 Supervision of pregnancy with insufficient antenatal care, unspecified trimester: Secondary | ICD-10-CM

## 2013-04-03 DIAGNOSIS — Z1389 Encounter for screening for other disorder: Secondary | ICD-10-CM

## 2013-04-03 DIAGNOSIS — Z3483 Encounter for supervision of other normal pregnancy, third trimester: Secondary | ICD-10-CM

## 2013-04-03 DIAGNOSIS — O98519 Other viral diseases complicating pregnancy, unspecified trimester: Secondary | ICD-10-CM

## 2013-04-03 DIAGNOSIS — Z331 Pregnant state, incidental: Secondary | ICD-10-CM

## 2013-04-03 LAB — CBC
HCT: 26.3 % — ABNORMAL LOW (ref 36.0–46.0)
Hemoglobin: 9.2 g/dL — ABNORMAL LOW (ref 12.0–15.0)
MCH: 28.7 pg (ref 26.0–34.0)
MCHC: 35 g/dL (ref 30.0–36.0)
MCV: 81.9 fL (ref 78.0–100.0)
Platelets: 125 10*3/uL — ABNORMAL LOW (ref 150–400)
RBC: 3.21 MIL/uL — ABNORMAL LOW (ref 3.87–5.11)
RDW: 13.3 % (ref 11.5–15.5)
WBC: 8.1 10*3/uL (ref 4.0–10.5)

## 2013-04-03 LAB — POCT URINALYSIS DIPSTICK
Blood, UA: NEGATIVE
Glucose, UA: NEGATIVE
Ketones, UA: NEGATIVE
Nitrite, UA: NEGATIVE
Protein, UA: NEGATIVE

## 2013-04-03 NOTE — Progress Notes (Signed)
Patient complaining of vaginal pain since last night.  Pain severe in nature and described as if someone had kicked her in the vagina. This am she had peri-umbilical pain.  No vomiting. No dysuria.  She reports good fetal movement; no vaginal bleeding or loss of fluid.  No change in vaginal discharge. Fetal Heart rate normal.  No other findings on physical exam. Patient reassured.  Follow up in 4 weeks.

## 2013-04-03 NOTE — Progress Notes (Signed)
For PN2 Today. C/C PAIN IN STOMACH, radiated to vaginal area.

## 2013-04-03 NOTE — Progress Notes (Signed)
Agree follow in 3 weeks

## 2013-04-04 LAB — RPR

## 2013-04-04 LAB — GLUCOSE TOLERANCE, 2 HOURS W/ 1HR
Glucose, 1 hour: 117 mg/dL (ref 70–170)
Glucose, 2 hour: 116 mg/dL (ref 70–139)
Glucose, Fasting: 76 mg/dL (ref 70–99)

## 2013-04-04 LAB — ANTIBODY SCREEN: Antibody Screen: NEGATIVE

## 2013-04-04 LAB — HSV 2 ANTIBODY, IGG: HSV 2 Glycoprotein G Ab, IgG: 3.56 IV — ABNORMAL HIGH

## 2013-04-04 LAB — HIV ANTIBODY (ROUTINE TESTING W REFLEX): HIV: NONREACTIVE

## 2013-04-17 ENCOUNTER — Encounter: Payer: Self-pay | Admitting: Women's Health

## 2013-04-17 ENCOUNTER — Ambulatory Visit (INDEPENDENT_AMBULATORY_CARE_PROVIDER_SITE_OTHER): Payer: Medicaid Other | Admitting: Women's Health

## 2013-04-17 VITALS — BP 96/58 | Wt 143.5 lb

## 2013-04-17 DIAGNOSIS — D696 Thrombocytopenia, unspecified: Secondary | ICD-10-CM

## 2013-04-17 DIAGNOSIS — Z1389 Encounter for screening for other disorder: Secondary | ICD-10-CM

## 2013-04-17 DIAGNOSIS — R768 Other specified abnormal immunological findings in serum: Secondary | ICD-10-CM

## 2013-04-17 DIAGNOSIS — Z331 Pregnant state, incidental: Secondary | ICD-10-CM

## 2013-04-17 DIAGNOSIS — O0930 Supervision of pregnancy with insufficient antenatal care, unspecified trimester: Secondary | ICD-10-CM

## 2013-04-17 DIAGNOSIS — Z23 Encounter for immunization: Secondary | ICD-10-CM

## 2013-04-17 DIAGNOSIS — O98519 Other viral diseases complicating pregnancy, unspecified trimester: Secondary | ICD-10-CM

## 2013-04-17 DIAGNOSIS — O99013 Anemia complicating pregnancy, third trimester: Secondary | ICD-10-CM

## 2013-04-17 DIAGNOSIS — Z3483 Encounter for supervision of other normal pregnancy, third trimester: Secondary | ICD-10-CM

## 2013-04-17 DIAGNOSIS — O99019 Anemia complicating pregnancy, unspecified trimester: Secondary | ICD-10-CM | POA: Insufficient documentation

## 2013-04-17 LAB — POCT URINALYSIS DIPSTICK
Glucose, UA: NEGATIVE
Ketones, UA: NEGATIVE
Nitrite, UA: NEGATIVE
Protein, UA: NEGATIVE

## 2013-04-17 MED ORDER — ACYCLOVIR 400 MG PO TABS: 400.0000 mg | ORAL_TABLET | Freq: Three times a day (TID) | ORAL | Status: DC

## 2013-04-17 MED ORDER — FERROUS SULFATE 325 (65 FE) MG PO TABS: 325.0000 mg | ORAL_TABLET | Freq: Two times a day (BID) | ORAL | Status: DC

## 2013-04-17 MED ORDER — INFLUENZA VAC SPLIT QUAD 0.5 ML IM SUSP
0.5000 mL | Freq: Once | INTRAMUSCULAR | Status: AC
  Administered 2013-04-17: 0.5 mL via INTRAMUSCULAR

## 2013-04-17 NOTE — Progress Notes (Signed)
Reports good fm. Denies uc's, lof, vb, urinary frequency, urgency, hesitancy, or dysuria.  No complaints.  Reviewed pn2 results, anemic- rx ferrous sulfate bid and iron-rich food info given. Discussed hsv2, rx acylovir tid to begin at 34wks.  All questions answered. F/U in 2wks for visit.

## 2013-04-17 NOTE — Patient Instructions (Signed)
Anemia During Pregnancy Hemoglobin is the protein in your blood that carries oxygen. If this protein becomes too low, you are anemic. The most common cause of anemia is iron deficiency. Anemia (low red blood cells) during pregnancy is common because the fetus uses more iron and folic acid as it is developing. Also, during pregnancy the plasma (liquid part of the blood) increases by about 50% and the red blood cells increase only 25%. This lowers the concentration of the red blood cells and creates a natural anemia-like situation during pregnancy. Mild anemia may not be noticeable. If it becomes severe, you will feel more tired and short of breath. It is harder to exercise, you could faint or you could feel your heart beat (have palpitations). CAUSES  There are different causes of anemia that can happen during pregnancy.  Not having enough iron in the body to make red blood cells (iron deficiency anemia). This is the most common cause.  Folic acid deficiency.  Vitamin B 12 deficiency.  Thalassemia. This is a hereditary condition found in Mediterranean, [place] and African American families.  Sickle cell, SS and [place] anemia. This is a hereditary condition found in African American families.  Immune Thrombocytopenia Purpura is anemia usually seen in women up to age 69 but can also be caused by over-the-counter or prescription medications at any age. It is caused by a low platelet count that produces bruising all over the body.  Neonatal Alloimmune Thrombocytopenia is a very rare anemia that causes a low platelet count and bleeding (hemorrhage) in the fetus from antibodies that go through the placenta to the baby from the mother's blood.  Hemolytic anemia is caused by destroying the red blood cells from an infection in the blood, medical illness or certain medications. SYMPTOMS   Tiredness.  Weakness.  Fainting.  Pale looking skin.  Headaches. DIAGNOSIS  The type of anemia is usually  diagnosed from blood studies, hemoglobin electrophoresis and by taking a family and medical history. TREATMENT  Before giving any treatment, your caregiver needs to find the cause of the anemia. Different treatments include:  Giving blood transfusions.  Giving glucorticoid or blood platelets when there are not enough platelets.  Giving iron pills.  Giving vitamin B 12.  Giving folic acid.  Removing the spleen. This is done in severe cases of thrombocytopenia purpura. HOME CARE INSTRUCTIONS   Your dietitian or caregiver can help you with your dietary questions.  Take iron and vitamins as told by your caregiver.  Eat a diet rich in iron such as liver, beef, whole grain bread, eggs and dried fruit.  Increase your vitamin C intake. This will help the stomach absorb more iron.  Eat green leafy vegetables. These are a good source of folic acid.  Inform your caregiver if anyone in your family has or had anemia problems. SEEK MEDICAL CARE IF:   You feel very tired, weak or faint.  You have frequent or lasting headaches.  You are looking pale.  You are bruising easily. Document Released: 06/11/2000 Document Revised: 09/06/2011 Document Reviewed: 01/15/2008 [facility] Patient Information 2014 [place], [place].  Iron-Rich Diet An iron-rich diet contains foods that are good sources of iron. Iron is an important mineral that helps your body produce hemoglobin. Hemoglobin is a protein in red blood cells that carries oxygen to the body's tissues. Sometimes, the iron level in your blood can be low. This may be caused by:  A lack of iron in your diet.  Blood loss.  Times of  growth, such as during pregnancy or during a child's growth and development. Low levels of iron can cause a decrease in the number of red blood cells. This can result in iron deficiency anemia. Iron deficiency anemia symptoms include:  Tiredness.  Weakness.  Irritability.  Increased chance of infection. Here  are some recommendations for daily iron intake:  Males older than 25 years of age need 8 mg of iron per day.  Women ages 49 to 48 need 18 mg of iron per day.  Pregnant women need 27 mg of iron per day, and women who are over 25 years of age and breastfeeding need 9 mg of iron per day.  Women over the age of 37 need 8 mg of iron per day. SOURCES OF IRON There are 2 types of iron that are found in food: heme iron and nonheme iron. Heme iron is absorbed by the body better than nonheme iron. Heme iron is found in meat, poultry, and fish. Nonheme iron is found in grains, beans, and vegetables. Heme Iron Sources Food / Iron (mg)  Chicken liver, 3 oz (85 g)/ 10 mg  Beef liver, 3 oz (85 g)/ 5.5 mg  Oysters, 3 oz (85 g)/ 8 mg  Beef, 3 oz (85 g)/ 2 to 3 mg  Shrimp, 3 oz (85 g)/ 2.8 mg  [place], 3 oz (85 g)/ 2 mg  Chicken, 3 oz (85 g) / 1 mg  Fish (tuna, halibut), 3 oz (85 g)/ 1 mg  Pork, 3 oz (85 g)/ 0.9 mg Nonheme Iron Sources Food / Iron (mg)  Ready-to-eat breakfast cereal, iron-fortified / 3.9 to 7 mg  Tofu,  cup / 3.4 mg  Kidney beans,  cup / 2.6 mg  Baked potato with skin / 2.7 mg  Asparagus,  cup / 2.2 mg  Avocado / 2 mg  Dried peaches,  cup / 1.6 mg  Raisins,  cup / 1.5 mg  Soy milk, 1 cup / 1.5 mg  Whole-wheat bread, 1 slice / 1.2 mg  Spinach, 1 cup / 0.8 mg  Broccoli,  cup / 0.6 mg IRON ABSORPTION Certain foods can decrease the body's absorption of iron. Try to avoid these foods and beverages while eating meals with iron-containing foods:  Coffee.  Tea.  Fiber.  Soy. Foods containing vitamin C can help increase the amount of iron your body absorbs from iron sources, especially from nonheme sources. Eat foods with vitamin C along with iron-containing foods to increase your iron absorption. Foods that are high in vitamin C include many fruits and vegetables. Some good sources are:  Fresh orange  juice.  Oranges.  Strawberries.  Mangoes.  Grapefruit.  Red bell peppers.  Green bell peppers.  Broccoli.  Potatoes with skin.  Tomato juice. Document Released: 01/26/2005 Document Revised: 09/06/2011 Document Reviewed: 12/03/2010 [employer] Patient Information 2014 [place], [place].

## 2013-05-01 ENCOUNTER — Ambulatory Visit (INDEPENDENT_AMBULATORY_CARE_PROVIDER_SITE_OTHER): Payer: Medicaid Other | Admitting: Advanced Practice Midwife

## 2013-05-01 VITALS — BP 100/62 | Wt 146.0 lb

## 2013-05-01 DIAGNOSIS — Z1389 Encounter for screening for other disorder: Secondary | ICD-10-CM

## 2013-05-01 DIAGNOSIS — O0930 Supervision of pregnancy with insufficient antenatal care, unspecified trimester: Secondary | ICD-10-CM

## 2013-05-01 DIAGNOSIS — O99019 Anemia complicating pregnancy, unspecified trimester: Secondary | ICD-10-CM

## 2013-05-01 DIAGNOSIS — O98519 Other viral diseases complicating pregnancy, unspecified trimester: Secondary | ICD-10-CM

## 2013-05-01 DIAGNOSIS — Z331 Pregnant state, incidental: Secondary | ICD-10-CM

## 2013-05-01 LAB — POCT URINALYSIS DIPSTICK
Blood, UA: NEGATIVE
Glucose, UA: NEGATIVE
Ketones, UA: NEGATIVE
Nitrite, UA: NEGATIVE
Protein, UA: NEGATIVE

## 2013-05-01 NOTE — Progress Notes (Signed)
No c/o at this time.  On acyclovir. Routine questions about pregnancy answered.  F/U in 2 weeks for LROB/GBS.

## 2013-05-04 ENCOUNTER — Other Ambulatory Visit: Payer: Self-pay | Admitting: Obstetrics and Gynecology

## 2013-05-15 ENCOUNTER — Encounter: Payer: Self-pay | Admitting: Women's Health

## 2013-05-15 ENCOUNTER — Ambulatory Visit (INDEPENDENT_AMBULATORY_CARE_PROVIDER_SITE_OTHER): Payer: Medicaid Other | Admitting: Women's Health

## 2013-05-15 VITALS — BP 114/58 | Wt 147.2 lb

## 2013-05-15 DIAGNOSIS — Z331 Pregnant state, incidental: Secondary | ICD-10-CM

## 2013-05-15 DIAGNOSIS — O0930 Supervision of pregnancy with insufficient antenatal care, unspecified trimester: Secondary | ICD-10-CM

## 2013-05-15 DIAGNOSIS — Z1389 Encounter for screening for other disorder: Secondary | ICD-10-CM

## 2013-05-15 DIAGNOSIS — Z3483 Encounter for supervision of other normal pregnancy, third trimester: Secondary | ICD-10-CM

## 2013-05-15 DIAGNOSIS — O99019 Anemia complicating pregnancy, unspecified trimester: Secondary | ICD-10-CM

## 2013-05-15 DIAGNOSIS — O26843 Uterine size-date discrepancy, third trimester: Secondary | ICD-10-CM | POA: Insufficient documentation

## 2013-05-15 DIAGNOSIS — O98519 Other viral diseases complicating pregnancy, unspecified trimester: Secondary | ICD-10-CM

## 2013-05-15 DIAGNOSIS — IMO0001 Reserved for inherently not codable concepts without codable children: Secondary | ICD-10-CM | POA: Insufficient documentation

## 2013-05-15 LAB — POCT URINALYSIS DIPSTICK
Blood, UA: NEGATIVE
Glucose, UA: NEGATIVE
Ketones, UA: NEGATIVE
Nitrite, UA: NEGATIVE
Protein, UA: NEGATIVE

## 2013-05-15 NOTE — Addendum Note (Signed)
Addended by: Cheral Marker on: 05/15/2013 09:14 AM   Modules accepted: Orders

## 2013-05-15 NOTE — Patient Instructions (Signed)
Preterm Labor Information Preterm labor is when labor starts at less than 37 weeks of pregnancy. The normal length of a pregnancy is 39 to 41 weeks. CAUSES Often, there is no identifiable underlying cause as to why a woman goes into preterm labor. One of the most common known causes of preterm labor is infection. Infections of the uterus, cervix, vagina, amniotic sac, bladder, kidney, or even the lungs (pneumonia) can cause labor to start. Other suspected causes of preterm labor include:   Urogenital infections, such as yeast infections and bacterial vaginosis.   Uterine abnormalities (uterine shape, uterine septum, fibroids, or bleeding from the placenta).   A cervix that has been operated on (it may fail to stay closed).   Malformations in the fetus.   Multiple gestations (twins, triplets, and so on).   Breakage of the amniotic sac.  RISK FACTORS  Having a previous history of preterm labor.   Having premature rupture of membranes (PROM).   Having a placenta that covers the opening of the cervix (placenta previa).   Having a placenta that separates from the uterus (placental abruption).   Having a cervix that is too weak to hold the fetus in the uterus (incompetent cervix).   Having too much fluid in the amniotic sac (polyhydramnios).   Taking illegal drugs or smoking while pregnant.   Not gaining enough weight while pregnant.   Being younger than 18 and older than 25 years old.   Having a low socioeconomic status.   Being African American. SYMPTOMS Signs and symptoms of preterm labor include:   Menstrual-like cramps, abdominal pain, or back pain.  Uterine contractions that are regular, as frequent as six in an hour, regardless of their intensity (may be mild or painful).  Contractions that start on the top of the uterus and spread down to the lower abdomen and back.   A sense of increased pelvic pressure.   A watery or bloody mucus discharge that  comes from the vagina.  TREATMENT Depending on the length of the pregnancy and other circumstances, your health care provider may suggest bed rest. If necessary, there are medicines that can be given to stop contractions and to mature the fetal lungs. If labor happens before 34 weeks of pregnancy, a prolonged hospital stay may be recommended. Treatment depends on the condition of both you and the fetus.  WHAT SHOULD YOU DO IF YOU THINK YOU ARE IN PRETERM LABOR? Call your health care provider right away. You will need to go to the hospital to get checked immediately. HOW CAN YOU PREVENT PRETERM LABOR IN FUTURE PREGNANCIES? You should:   Stop smoking if you smoke.  Maintain healthy weight gain and avoid chemicals and drugs that are not necessary.  Be watchful for any type of infection.  Inform your health care provider if you have a known history of preterm labor. Document Released: 09/04/2003 Document Revised: 02/14/2013 Document Reviewed: 07/17/2012 [employer] Patient Information 2014 [employer].    

## 2013-05-15 NOTE — Progress Notes (Signed)
Reports good fm. Denies uc's, lof, vb, urinary frequency, urgency, hesitancy, or dysuria.  No complaints.  FH 30cm @ 36.3wks, will get growth/afi u/s. Reviewed ptl s/s, fkc.  All questions answered. F/U asap for growth/afi u/s, then 1wk for visit as long as u/s normal.

## 2013-05-16 LAB — GC/CHLAMYDIA PROBE AMP
CT Probe RNA: NEGATIVE
GC Probe RNA: NEGATIVE

## 2013-05-17 ENCOUNTER — Ambulatory Visit (INDEPENDENT_AMBULATORY_CARE_PROVIDER_SITE_OTHER): Payer: Medicaid Other

## 2013-05-17 ENCOUNTER — Encounter: Payer: Self-pay | Admitting: Women's Health

## 2013-05-17 DIAGNOSIS — O26849 Uterine size-date discrepancy, unspecified trimester: Secondary | ICD-10-CM

## 2013-05-17 DIAGNOSIS — O26843 Uterine size-date discrepancy, third trimester: Secondary | ICD-10-CM

## 2013-05-17 LAB — STREP B DNA PROBE: GBSP: NEGATIVE

## 2013-05-17 NOTE — Progress Notes (Signed)
U/S(36+5wks)-vtx active fetus, EFW 5 lb 11 oz (19th%tile), fluid WNL AFI-10.8cm, anterior gr 1 placenta, female fetus "Chance"

## 2013-05-22 ENCOUNTER — Ambulatory Visit (INDEPENDENT_AMBULATORY_CARE_PROVIDER_SITE_OTHER): Payer: Medicaid Other | Admitting: Women's Health

## 2013-05-22 ENCOUNTER — Encounter: Payer: Self-pay | Admitting: Women's Health

## 2013-05-22 VITALS — BP 112/64 | Wt 153.0 lb

## 2013-05-22 DIAGNOSIS — Z3483 Encounter for supervision of other normal pregnancy, third trimester: Secondary | ICD-10-CM

## 2013-05-22 DIAGNOSIS — O26843 Uterine size-date discrepancy, third trimester: Secondary | ICD-10-CM

## 2013-05-22 DIAGNOSIS — O0930 Supervision of pregnancy with insufficient antenatal care, unspecified trimester: Secondary | ICD-10-CM

## 2013-05-22 DIAGNOSIS — O99019 Anemia complicating pregnancy, unspecified trimester: Secondary | ICD-10-CM

## 2013-05-22 DIAGNOSIS — Z331 Pregnant state, incidental: Secondary | ICD-10-CM

## 2013-05-22 DIAGNOSIS — Z1389 Encounter for screening for other disorder: Secondary | ICD-10-CM

## 2013-05-22 DIAGNOSIS — O98519 Other viral diseases complicating pregnancy, unspecified trimester: Secondary | ICD-10-CM

## 2013-05-22 LAB — POCT URINALYSIS DIPSTICK
Blood, UA: NEGATIVE
Glucose, UA: NEGATIVE
Ketones, UA: NEGATIVE
Nitrite, UA: NEGATIVE
Protein, UA: NEGATIVE

## 2013-05-22 NOTE — Progress Notes (Signed)
Reports good fm. Denies uc's, lof, vb, urinary frequency, urgency, hesitancy, or dysuria.  No complaints.  Reviewed u/s results, labor s/s, fkc.  All questions answered. F/U in 1wk for visit.

## 2013-05-22 NOTE — Patient Instructions (Signed)

## 2013-05-30 ENCOUNTER — Encounter: Payer: Self-pay | Admitting: Advanced Practice Midwife

## 2013-05-30 ENCOUNTER — Ambulatory Visit (INDEPENDENT_AMBULATORY_CARE_PROVIDER_SITE_OTHER): Payer: Medicaid Other | Admitting: Advanced Practice Midwife

## 2013-05-30 VITALS — BP 110/70 | Wt 150.0 lb

## 2013-05-30 DIAGNOSIS — O26843 Uterine size-date discrepancy, third trimester: Secondary | ICD-10-CM

## 2013-05-30 DIAGNOSIS — O98519 Other viral diseases complicating pregnancy, unspecified trimester: Secondary | ICD-10-CM

## 2013-05-30 DIAGNOSIS — Z331 Pregnant state, incidental: Secondary | ICD-10-CM

## 2013-05-30 DIAGNOSIS — Z1389 Encounter for screening for other disorder: Secondary | ICD-10-CM

## 2013-05-30 DIAGNOSIS — O0930 Supervision of pregnancy with insufficient antenatal care, unspecified trimester: Secondary | ICD-10-CM

## 2013-05-30 DIAGNOSIS — O99019 Anemia complicating pregnancy, unspecified trimester: Secondary | ICD-10-CM

## 2013-05-30 LAB — POCT URINALYSIS DIPSTICK
Blood, UA: NEGATIVE
Glucose, UA: NEGATIVE
Ketones, UA: NEGATIVE
Nitrite, UA: NEGATIVE
Protein, UA: NEGATIVE

## 2013-05-30 NOTE — Progress Notes (Signed)
Having some irregular contractions. Denies vaginal bleeding and leaking of fluid. No complaints or concerns at this time. All questions answered. F/u in 1 week for visit and growth ultrasound.

## 2013-06-07 ENCOUNTER — Ambulatory Visit (INDEPENDENT_AMBULATORY_CARE_PROVIDER_SITE_OTHER): Payer: Medicaid Other | Admitting: Advanced Practice Midwife

## 2013-06-07 ENCOUNTER — Encounter: Payer: Self-pay | Admitting: Advanced Practice Midwife

## 2013-06-07 ENCOUNTER — Ambulatory Visit (INDEPENDENT_AMBULATORY_CARE_PROVIDER_SITE_OTHER): Payer: Medicaid Other

## 2013-06-07 ENCOUNTER — Other Ambulatory Visit: Payer: Self-pay | Admitting: Advanced Practice Midwife

## 2013-06-07 VITALS — BP 100/70 | Wt 155.0 lb

## 2013-06-07 DIAGNOSIS — O26849 Uterine size-date discrepancy, unspecified trimester: Secondary | ICD-10-CM

## 2013-06-07 DIAGNOSIS — O26843 Uterine size-date discrepancy, third trimester: Secondary | ICD-10-CM

## 2013-06-07 DIAGNOSIS — O0930 Supervision of pregnancy with insufficient antenatal care, unspecified trimester: Secondary | ICD-10-CM

## 2013-06-07 DIAGNOSIS — Z331 Pregnant state, incidental: Secondary | ICD-10-CM

## 2013-06-07 DIAGNOSIS — Z1389 Encounter for screening for other disorder: Secondary | ICD-10-CM

## 2013-06-07 DIAGNOSIS — O98519 Other viral diseases complicating pregnancy, unspecified trimester: Secondary | ICD-10-CM

## 2013-06-07 DIAGNOSIS — O99019 Anemia complicating pregnancy, unspecified trimester: Secondary | ICD-10-CM

## 2013-06-07 LAB — POCT URINALYSIS DIPSTICK
Blood, UA: NEGATIVE
Glucose, UA: NEGATIVE
Ketones, UA: NEGATIVE
Nitrite, UA: NEGATIVE
Protein, UA: NEGATIVE

## 2013-06-07 NOTE — Progress Notes (Signed)
U/S(39+5wks)-vtx active fetus, EFW 7 lb 1 oz (30th%tile), fluid wnl AFI- 7cm, anterior Gr 2 placenta, female fetus "Chance", FHR-122 bpm

## 2013-06-07 NOTE — Progress Notes (Signed)
U/S today normal.    No c/o at this time.  Routine questions about pregnancy answered.  F/U in 1 weeks for LROB.

## 2013-06-14 ENCOUNTER — Encounter: Payer: Medicaid Other | Admitting: Advanced Practice Midwife

## 2013-07-03 ENCOUNTER — Telehealth: Payer: Self-pay | Admitting: *Deleted

## 2013-07-05 NOTE — Telephone Encounter (Signed)
I didn't talk to pt until I was already at Tina [facility].  She is going to come by 1/9 in the am to get a note.  You can write it and have someone sign it that she can return to work without restrictions.  Thanks

## 2013-07-05 NOTE — Telephone Encounter (Signed)
Pt states vaginal delivery 06/11/2013 with no complications at [facility]. Pt has not been seen for postpartum care at out office. Pt requesting a return to work note. 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

## 2013-07-06 NOTE — Telephone Encounter (Signed)
Return to work note left at front desk for pt to pick up.

## 2013-07-25 ENCOUNTER — Encounter: Payer: Self-pay | Admitting: Adult Health

## 2013-07-25 ENCOUNTER — Ambulatory Visit (INDEPENDENT_AMBULATORY_CARE_PROVIDER_SITE_OTHER): Payer: Medicaid Other | Admitting: Adult Health

## 2013-07-25 DIAGNOSIS — Z309 Encounter for contraceptive management, unspecified: Secondary | ICD-10-CM

## 2013-07-25 NOTE — Progress Notes (Signed)
Patient ID: [name], female   DOB: 10/30/1985, 28 y.o.   MRN: [id number] Tina Fletcher is a 28 year old black female in for postpartum visit,she delivered at [facility], 40 minutes after arrival by Dr Donzetta Matters.  Delivery Date:06/11/13, baby boy, Chance 6 lbs 10 oz. Method of Delivery: Vaginal   Sexual Activity since delivery: NO  Method of Feeding: bottle  Number of weeks bleeding post delivery: Still bleeding some  Review of Systems: Patient denies any headaches, blurred vision, shortness of breath, chest pain, abdominal pain, problems with bowel movements, urination, or intercourse, no sex yet. No joint pain or mood changes, denies depression.  Reviewed past medical,surgical, social and family history. Reviewed medications and allergies.  Depression Score: 8 BP 106/58  Ht 5\' 8"  (1.727 m)  Wt 134 lb (60.782 kg)  BMI 20.38 kg/m2  Breastfeeding? Unknown Pelvic Exam:   External genitalia is normal in appearance.  The vagina is normal in appearance with red tannish blood, no odor. The cervix is bulbous.  Uterus is felt to be normal size, shape, and contour, well involuted.  No adnexal masses or tenderness noted. Discussed birth control and wants nexplanon, has had before.  Impression:  Status post delivery, post partum check, depression screening, contraceptive management.   Plan:   Will order nexplanon Return in about 2.5 weeks for stat [facility] in am and nexpalnon insertion in pm, NO SEX til after nexplanon insertion Review handout on nexplanon

## 2013-07-25 NOTE — Patient Instructions (Signed)
Etonogestrel implant What is this medicine? ETONOGESTREL (et oh noe JES trel) is a contraceptive (birth control) device. It is used to prevent pregnancy. It can be used for up to 3 years. This medicine may be used for other purposes; ask your health care provider or pharmacist if you have questions. COMMON BRAND NAME(S): Implanon, Nexplanon  What should I tell my health care provider before I take this medicine? They need to know if you have any of these conditions: -abnormal vaginal bleeding -blood vessel disease or blood clots -cancer of the breast, cervix, or liver -depression -diabetes -gallbladder disease -headaches -heart disease or recent heart attack -high blood pressure -high cholesterol -kidney disease -liver disease -renal disease -seizures -tobacco smoker -an unusual or allergic reaction to etonogestrel, other hormones, anesthetics or antiseptics, medicines, foods, dyes, or preservatives -pregnant or trying to get pregnant -breast-feeding How should I use this medicine? This device is inserted just under the skin on the inner side of your upper arm by a health care professional. Talk to your pediatrician regarding the use of this medicine in children. Special care may be needed. Overdosage: If you think you've taken too much of this medicine contact a poison control center or emergency room at once. Overdosage: If you think you have taken too much of this medicine contact a poison control center or emergency room at once. NOTE: This medicine is only for you. Do not share this medicine with others. What if I miss a dose? This does not apply. What may interact with this medicine? Do not take this medicine with any of the following medications: -amprenavir -bosentan -fosamprenavir This medicine may also interact with the following medications: -barbiturate medicines for inducing sleep or treating seizures -certain medicines for fungal infections like ketoconazole and  itraconazole -griseofulvin -medicines to treat seizures like carbamazepine, felbamate, oxcarbazepine, phenytoin, topiramate -modafinil -phenylbutazone -rifampin -some medicines to treat HIV infection like atazanavir, indinavir, lopinavir, nelfinavir, tipranavir, ritonavir -St. John's wort This list may not describe all possible interactions. Give your health care provider a list of all the medicines, herbs, non-prescription drugs, or dietary supplements you use. Also tell them if you smoke, drink alcohol, or use illegal drugs. Some items may interact with your medicine. What should I watch for while using this medicine? This product does not protect you against HIV infection (AIDS) or other sexually transmitted diseases. You should be able to feel the implant by pressing your fingertips over the skin where it was inserted. Tell your doctor if you cannot feel the implant. What side effects may I notice from receiving this medicine? Side effects that you should report to your doctor or health care professional as soon as possible: -allergic reactions like skin rash, itching or hives, swelling of the face, lips, or tongue -breast lumps -changes in vision -confusion, trouble speaking or understanding -dark urine -depressed mood -general ill feeling or flu-like symptoms -light-colored stools -loss of appetite, nausea -right upper belly pain -severe headaches -severe pain, swelling, or tenderness in the abdomen -shortness of breath, chest pain, swelling in a leg -signs of pregnancy -sudden numbness or weakness of the face, arm or leg -trouble walking, dizziness, loss of balance or coordination -unusual vaginal bleeding, discharge -unusually weak or tired -yellowing of the eyes or skin Side effects that usually do not require medical attention (Report these to your doctor or health care professional if they continue or are bothersome.): -acne -breast pain -changes in  weight -cough -fever or chills -headache -irregular menstrual bleeding -itching, burning,   and vaginal discharge -pain or difficulty passing urine -sore throat This list may not describe all possible side effects. Call your doctor for medical advice about side effects. You may report side effects to FDA at 1-800-FDA-1088. Where should I keep my medicine? This drug is given in a hospital or clinic and will not be stored at home. NOTE: This sheet is a summary. It may not cover all possible information. If you have questions about this medicine, talk to your doctor, pharmacist, or health care provider.  2014, Elsevier/Gold Standard. (2011-12-20 15:37:45) NO SEX Return in 2 weeks for nexplanon

## 2013-07-26 ENCOUNTER — Ambulatory Visit: Payer: Medicaid Other | Admitting: Advanced Practice Midwife

## 2013-08-13 ENCOUNTER — Ambulatory Visit (INDEPENDENT_AMBULATORY_CARE_PROVIDER_SITE_OTHER): Payer: Medicaid Other | Admitting: Adult Health

## 2013-08-13 ENCOUNTER — Encounter: Payer: Self-pay | Admitting: Adult Health

## 2013-08-13 ENCOUNTER — Other Ambulatory Visit: Payer: Medicaid Other

## 2013-08-13 VITALS — BP 120/76 | Ht 68.0 in | Wt 135.0 lb

## 2013-08-13 DIAGNOSIS — Z30017 Encounter for initial prescription of implantable subdermal contraceptive: Secondary | ICD-10-CM

## 2013-08-13 DIAGNOSIS — Z3200 Encounter for pregnancy test, result unknown: Secondary | ICD-10-CM

## 2013-08-13 HISTORY — DX: Encounter for initial prescription of implantable subdermal contraceptive: Z30.017

## 2013-08-13 LAB — HCG, QUANTITATIVE, PREGNANCY: hCG, Beta Chain, Quant, S: <1 m[IU]/mL

## 2013-08-13 NOTE — Progress Notes (Signed)
Subjective:     Patient ID: Tina Fletcher, female   DOB: Nov 04, 1985, 28 y.o.   MRN: [id number]  HPI Devine is a 28 year old black female in for nexplanon insertion.  Review of Systems See HPI Reviewed past medical,surgical, social and family history. Reviewed medications and allergies.     Objective:   Physical Exam BP 120/76  Ht 5\' 8"  (1.727 m)  Wt 135 lb (61.236 kg)  BMI 20.53 kg/m2  LMP 07/30/2013  Breastfeeding? Nohad negative QHCG this am. Consent formed signed. Left arm cleansed with betadine, and injected with 1.5 cc 2% lidocaine and waited til numb. Nexplanon easily inserted and steri strips applied. Rod palpated by provider and pt. Pressure dressing applied.    Assessment:     Nexplanon insertion, lot [id number] exp 12/2015     Plan:    Use condoms x 2 weeks, keep clean and dry x 24 hours, no heavy lifting, keep steri strips on x 72 hours, Keep pressure dressing on x 24 hours. Follow up prn problems.   Remove in 3 years

## 2013-08-13 NOTE — Patient Instructions (Signed)
Use condoms x 2 weeks, keep clean and dry x 24 hours, no heavy lifting, keep steri strips on x 72 hours, Keep pressure dressing on x 24 hours. Follow up prn problems.  

## 2014-02-08 ENCOUNTER — Telehealth: Payer: Self-pay | Admitting: Obstetrics and Gynecology

## 2014-02-08 NOTE — Telephone Encounter (Signed)
Spoke with pt. Pt got Nexplanon in Feb and is having 3 weeks of bleeding every month. Pt states the bleeding is heavy. I advised there is meds to help with bleeding, but she would need to be seen first. Pt has an appt on Tuesday. I advised to keep that appt. Pt voiced understanding. JSY

## 2014-02-12 ENCOUNTER — Ambulatory Visit (INDEPENDENT_AMBULATORY_CARE_PROVIDER_SITE_OTHER): Payer: Medicaid Other | Admitting: Adult Health

## 2014-02-12 ENCOUNTER — Encounter: Payer: Self-pay | Admitting: Adult Health

## 2014-02-12 VITALS — BP 110/80 | Ht 69.0 in | Wt 118.0 lb

## 2014-02-12 DIAGNOSIS — Z3202 Encounter for pregnancy test, result negative: Secondary | ICD-10-CM

## 2014-02-12 DIAGNOSIS — N920 Excessive and frequent menstruation with regular cycle: Secondary | ICD-10-CM

## 2014-02-12 DIAGNOSIS — N926 Irregular menstruation, unspecified: Secondary | ICD-10-CM

## 2014-02-12 HISTORY — DX: Irregular menstruation, unspecified: N92.6

## 2014-02-12 LAB — POCT URINE PREGNANCY: Preg Test, Ur: NEGATIVE

## 2014-02-12 MED ORDER — FERRALET 90 90-1 MG PO TABS: ORAL_TABLET | ORAL | Status: DC

## 2014-02-12 MED ORDER — MEGESTROL ACETATE 40 MG PO TABS: ORAL_TABLET | ORAL | Status: DC

## 2014-02-12 NOTE — Patient Instructions (Signed)
Dysfunctional Uterine Bleeding Normally, menstrual periods begin between ages 11 to 17 in young women. A normal menstrual cycle/period may begin every 23 days up to 35 days and lasts from 1 to 7 days. Around 12 to 14 days before your menstrual period starts, ovulation (ovary produces an egg) occurs. When counting the time between menstrual periods, count from the first day of bleeding of the previous period to the first day of bleeding of the next period. Dysfunctional (abnormal) uterine bleeding is bleeding that is different from a normal menstrual period. Your periods may come earlier or later than usual. They may be lighter, have blood clots or be heavier. You may have bleeding between periods, or you may skip one period or more. You may have bleeding after sexual intercourse, bleeding after menopause, or no menstrual period. CAUSES   Pregnancy (normal, miscarriage, tubal).  IUDs (intrauterine device, birth control).  Birth control pills.  Hormone treatment.  Menopause.  Infection of the cervix.  Blood clotting problems.  Infection of the inside lining of the uterus.  Endometriosis, inside lining of the uterus growing in the pelvis and other female organs.  Adhesions (scar tissue) inside the uterus.  Obesity or severe weight loss.  Uterine polyps inside the uterus.  Cancer of the vagina, cervix, or uterus.  Ovarian cysts or polycystic ovary syndrome.  Medical problems (diabetes, thyroid disease).  Uterine fibroids (noncancerous tumor).  Problems with your female hormones.  Endometrial hyperplasia, very thick lining and enlarged cells inside of the uterus.  Medicines that interfere with ovulation.  Radiation to the pelvis or abdomen.  Chemotherapy. DIAGNOSIS   Your doctor will discuss the history of your menstrual periods, medicines you are taking, changes in your weight, stress in your life, and any medical problems you may have.  Your doctor will do a physical  and pelvic examination.  Your doctor may want to perform certain tests to make a diagnosis, such as:  Pap test.  Blood tests.  Cultures for infection.  CT scan.  Ultrasound.  Hysteroscopy.  Laparoscopy.  MRI.  Hysterosalpingography.  D and C.  Endometrial biopsy. TREATMENT  Treatment will depend on the cause of the dysfunctional uterine bleeding (DUB). Treatment may include:  Observing your menstrual periods for a couple of months.  Prescribing medicines for medical problems, including:  Antibiotics.  Hormones.  Birth control pills.  Removing an IUD (intrauterine device, birth control).  Surgery:  D and C (scrape and remove tissue from inside the uterus).  Laparoscopy (examine inside the abdomen with a lighted tube).  Uterine ablation (destroy lining of the uterus with electrical current, laser, heat, or freezing).  Hysteroscopy (examine cervix and uterus with a lighted tube).  Hysterectomy (remove the uterus). HOME CARE INSTRUCTIONS   If medicines were prescribed, take exactly as directed. Do not change or switch medicines without consulting your caregiver.  Long term heavy bleeding may result in iron deficiency. Your caregiver may have prescribed iron pills. They help replace the iron that your body lost from heavy bleeding. Take exactly as directed.  Do not take aspirin or medicines that contain aspirin one week before or during your menstrual period. Aspirin may make the bleeding worse.  If you need to change your sanitary pad or tampon more than once every 2 hours, stay in bed with your feet elevated and a cold pack on your lower abdomen. Rest as much as possible, until the bleeding stops or slows down.  Eat well-balanced meals. Eat foods high in iron. Examples   are:  Leafy green vegetables.  Whole-grain breads and cereals.  Eggs.  Meat.  Liver.  Do not try to lose weight until the abnormal bleeding has stopped and your blood iron level is  back to normal. Do not lift more than ten pounds or do strenuous activities when you are bleeding.  For a couple of months, make note on your calendar, marking the start and ending of your period, and the type of bleeding (light, medium, heavy, spotting, clots or missed periods). This is for your caregiver to better evaluate your problem. SEEK MEDICAL CARE IF:   You develop nausea (feeling sick to your stomach) and vomiting, dizziness, or diarrhea while you are taking your medicine.  You are getting lightheaded or weak.  You have any problems that may be related to the medicine you are taking.  You develop pain with your DUB.  You want to remove your IUD.  You want to stop or change your birth control pills or hormones.  You have any type of abnormal bleeding mentioned above.  You are over 43 years old and have not had a menstrual period yet.  You are 28 years old and you are still having menstrual periods.  You have any of the symptoms mentioned above.  You develop a rash. SEEK IMMEDIATE MEDICAL CARE IF:   An oral temperature above 102 F (38.9 C) develops.  You develop chills.  You are changing your sanitary pad or tampon more than once an hour.  You develop abdominal pain.  You pass out or faint. Document Released: 06/11/2000 Document Revised: 09/06/2011 Document Reviewed: 05/13/2009 [facility] Patient Information 2015 [place], [place]. This information is not intended to replace advice given to you by your health care provider. Make sure you discuss any questions you have with your health care provider. Take megace Continue iron

## 2014-02-12 NOTE — Progress Notes (Signed)
Subjective:     Patient ID: Tina Fletcher, female   DOB: Aug 20, 1985, 28 y.o.   MRN: [id number]  HPI Shannikia is a 28 year old black female in complaining of bleeding for 2-3 weeks with periods.Has nexplanon.No new sex partners or pain.Is out of iron pills, has history of anemia.  Review of Systems See HPI Reviewed past medical,surgical, social and family history. Reviewed medications and allergies.     Objective:   Physical Exam BP 110/80  Ht 5\' 9"  (1.753 m)  Wt 118 lb (53.524 kg)  BMI 17.42 kg/m2  LMP 01/26/2014  Breastfeeding? NoUPT negative,Skin warm and dry.Pelvic: external genitalia is normal in appearance, vagina: period like blood, no odor, cervix:smooth and bulbous, no CMT, uterus: normal size, shape and contour, non tender, no masses felt, adnexa: no masses or tenderness noted. GC/CHL obtained.     Assessment:     Abnormal periods    Plan:    Rx megace 40 mg #45 3 x 5 days then 2 x 5 days then 1 daily with 1 refill   Rx ferralet 90 #30 1 daily with 11 refills Follow up prn  Review handout on DUB GC/CHL sent

## 2014-02-13 ENCOUNTER — Telehealth: Payer: Self-pay | Admitting: Adult Health

## 2014-02-13 LAB — GC/CHLAMYDIA PROBE AMP
CT Probe RNA: NEGATIVE
GC Probe RNA: NEGATIVE

## 2014-02-13 NOTE — Telephone Encounter (Signed)
Left message lab tests negative

## 2014-04-29 ENCOUNTER — Encounter: Payer: Self-pay | Admitting: Adult Health

## 2014-08-14 ENCOUNTER — Other Ambulatory Visit: Payer: Self-pay | Admitting: Adult Health

## 2014-12-18 ENCOUNTER — Encounter (HOSPITAL_COMMUNITY): Payer: Self-pay

## 2014-12-18 ENCOUNTER — Emergency Department (HOSPITAL_COMMUNITY)
Admission: EM | Admit: 2014-12-18 | Discharge: 2014-12-19 | Disposition: A | Discharge status: Home / Self Care | Payer: Medicaid Other | Attending: Emergency Medicine | Admitting: Emergency Medicine

## 2014-12-18 DIAGNOSIS — Z8742 Personal history of other diseases of the female genital tract: Secondary | ICD-10-CM | POA: Diagnosis not present

## 2014-12-18 DIAGNOSIS — Z8619 Personal history of other infectious and parasitic diseases: Secondary | ICD-10-CM | POA: Insufficient documentation

## 2014-12-18 DIAGNOSIS — Z308 Encounter for other contraceptive management: Secondary | ICD-10-CM | POA: Diagnosis not present

## 2014-12-18 DIAGNOSIS — R112 Nausea with vomiting, unspecified: Secondary | ICD-10-CM | POA: Diagnosis not present

## 2014-12-18 DIAGNOSIS — R519 Headache, unspecified: Secondary | ICD-10-CM

## 2014-12-18 DIAGNOSIS — Z79899 Other long term (current) drug therapy: Secondary | ICD-10-CM | POA: Insufficient documentation

## 2014-12-18 DIAGNOSIS — R51 Headache: Secondary | ICD-10-CM | POA: Insufficient documentation

## 2014-12-18 MED ORDER — METOCLOPRAMIDE HCL 5 MG/ML IJ SOLN
10.0000 mg | Freq: Once | INTRAMUSCULAR | Status: AC
  Administered 2014-12-18: 10 mg via INTRAVENOUS
  Filled 2014-12-18: qty 2

## 2014-12-18 MED ORDER — SODIUM CHLORIDE 0.9 % IV BOLUS (SEPSIS)
1000.0000 mL | Freq: Once | INTRAVENOUS | Status: AC
  Administered 2014-12-18: 1000 mL via INTRAVENOUS

## 2014-12-18 MED ORDER — DIPHENHYDRAMINE HCL 50 MG/ML IJ SOLN
25.0000 mg | Freq: Once | INTRAMUSCULAR | Status: AC
  Administered 2014-12-18: 25 mg via INTRAVENOUS
  Filled 2014-12-18: qty 1

## 2014-12-18 NOTE — ED Notes (Signed)
   12/18/14 2321  HEENT  HEENT (WDL) X  Head and Face Symmetrical (Pt c/o headache)

## 2014-12-18 NOTE — ED Notes (Signed)
Headache for 2 weeks with vomiting, states she had been taking otc meds for same without relief.

## 2014-12-18 NOTE — ED Provider Notes (Signed)
CSN: [id number]     Arrival date & time 12/18/14  2309 History  This chart was scribed for Devoria Albe, MD by Octavia Heir, ED Scribe. This patient was seen in room APA16A/APA16A and the patient's care was started at 11:23 PM.    Chief Complaint  Patient presents with  . Headache     The history is provided by the patient. No language interpreter was used.    HPI Comments: Tina Fletcher is a 29 y.o. female who presents to the Emergency Department complaining of gradual worsening generalized headache onset 2 weeks ago. She states initially it was on her left side.  Pt notes her headache is a sharp, throbbing, sensation and it woke her out of her sleep this evening around 8 pm. Pt has associated nausea and notes some vomiting a few times last week and tonight. Pt is light and sound sensitive. She reports taking Excedrin to alleviate the pain with no relief yesterday or today. Pt denies blurred/double vision, tingling/numbness in legs. She has had headaches in the past but they usually only last 1 day and are relieved with the Excedrin. Pt is a non-smoker. She has not noticed her headaches being worse with her implant (nexplanon)  PCP none GYN Dr Emelda Fear  Past Medical History  Diagnosis Date  . Medical history non-contributory   . Herpes simplex without mention of complication   . Nexplanon insertion 08/13/2013    08/13/13 inserted left arm, remove 08/13/16  . Abnormal menstrual periods 02/12/2014   Past Surgical History  Procedure Laterality Date  . No past surgeries     Family History  Problem Relation Age of Onset  . Diabetes Maternal Grandmother   . Cancer Paternal Grandfather     prancreatic  . COPD Maternal Grandfather   . Cancer Maternal Grandfather     prostate  . Asthma Maternal Grandfather   . Asthma Son   . Bronchitis Son    History  Substance Use Topics  . Smoking status: Never Smoker   . Smokeless tobacco: Never Used  . Alcohol Use: No   employed  OB  History    Gravida Para Term Preterm AB TAB SAB Ectopic Multiple Living   Review of Systems  All other systems reviewed and are negative.     Allergies  Review of patient's allergies indicates no known allergies.  Home Medications   Prior to Admission medications   Medication Sig Start Date End Date Taking? Authorizing Provider  acetaminophen (TYLENOL) 500 MG tablet Take 500 mg by mouth as needed for pain.   Yes Historical Provider, MD  acyclovir (ZOVIRAX) 400 MG tablet Take 1 tablet (400 mg total) by mouth 3 (three) times daily. 04/17/13  Yes Cheral Marker, CNM  etonogestrel (NEXPLANON) 68 MG IMPL implant Inject 1 each into the skin once.   Yes Historical Provider, MD  Fe Cbn-Fe Gluc-FA-B12-C-DSS (FERRALET 90) 90-1 MG TABS Take 1 daily 02/12/14  Yes Adline Potter, NP  ferrous sulfate 325 (65 FE) MG tablet Take 1 tablet (325 mg total) by mouth 2 (two) times daily with a meal. 04/17/13  Yes Cheral Marker, CNM  megestrol (MEGACE) 40 MG tablet TAKE 3 TABS BY MOUTH x FIVE DAYS; THEN 2 TABS x FIVE DAYS; THEN 1 TABDAILY THEREAFTER. 08/14/14  Yes Adline Potter, NP  omeprazole (PRILOSEC) 20 MG capsule Take 1 capsule (20 mg total) by mouth daily.  1 tablet a day 03/27/13  Yes Lazaro Arms, MD   Triage vitals: BP 118/88 mmHg  Pulse 72  Temp(Src) 97.7 F (36.5 C)  Resp 18  Ht 5\' 9"  (1.753 m)  Wt 120 lb (54.432 kg)  BMI 17.71 kg/m2  SpO2 98%  LMP 10/28/2014  Vital signs normal   Physical Exam  Constitutional: She is oriented to person, place, and time. She appears well-developed and well-nourished.  Non-toxic appearance. She does not appear ill. No distress.  HENT:  Head: Normocephalic and atraumatic.  Right Ear: External ear normal.  Left Ear: External ear normal.  Nose: Nose normal. No mucosal edema or rhinorrhea.  Mouth/Throat: Oropharynx is clear and moist and mucous membranes are normal. No dental abscesses or uvula swelling.  Eyes:  Conjunctivae and EOM are normal. Pupils are equal, round, and reactive to light.  Neck: Normal range of motion and full passive range of motion without pain. Neck supple.  Cardiovascular: Normal rate, regular rhythm and normal heart sounds.  Exam reveals no gallop and no friction rub.   No murmur heard. Pulmonary/Chest: Effort normal and breath sounds normal. No respiratory distress. She has no wheezes. She has no rhonchi. She has no rales. She exhibits no tenderness and no crepitus.  Abdominal: Soft. Normal appearance and bowel sounds are normal. She exhibits no distension. There is no tenderness. There is no rebound and no guarding.  Musculoskeletal: Normal range of motion. She exhibits no edema or tenderness.  Moves all extremities well.   Neurological: She is alert and oriented to person, place, and time. She has normal strength. No cranial nerve deficit.  Skin: Skin is warm, dry and intact. No rash noted. No erythema. No pallor.  Psychiatric: She has a normal mood and affect. Her speech is normal and behavior is normal. Her mood appears not anxious.  Nursing note and vitals reviewed.   ED Course  Procedures   Medications  sodium chloride 0.9 % bolus 1,000 mL (0 mLs Intravenous Stopped 12/19/14 0018)  metoCLOPramide (REGLAN) injection 10 mg (10 mg Intravenous Given 12/18/14 2339)  diphenhydrAMINE (BENADRYL) injection 25 mg (25 mg Intravenous Given 12/18/14 2339)    DIAGNOSTIC STUDIES: Oxygen Saturation is 98% on RA, normal by my interpretation.  COORDINATION OF CARE: ' 11:28 PM Discussed treatment plan which includes IV fluids and pain medication with pt at bedside and pt agreed to plan.  Recheck at discharge, patient is smiling, states her headache is almost gone, feels ready to be discharged.   Labs Review Labs Reviewed - No data to display  Imaging Review No results found.   EKG Interpretation None      MDM   Final diagnoses:  Headache, unspecified headache type     Plan discharge  Devoria Albe, MD, FACEP   I personally performed the services described in this documentation, which was scribed in my presence. The recorded information has been reviewed and considered.  Devoria Albe, MD, Concha Pyo, MD 12/19/14 [phone]

## 2014-12-19 NOTE — Discharge Instructions (Signed)
Go home and rest. Drink plenty of fluids. Recheck as needed.    Recurrent Migraine Headache A migraine headache is very bad, throbbing pain on one or both sides of your head. Recurrent migraines keep coming back. Talk to your doctor about what things may bring on (trigger) your migraine headaches. HOME CARE  Only take medicines as told by your doctor.  Lie down in a dark, quiet room when you have a migraine.  Keep a journal to find out if certain things bring on migraine headaches. For example, write down:  What you eat and drink.  How much sleep you get.  Any change to your diet or medicines.  Lessen how much alcohol you drink.  Quit smoking if you smoke.  Get enough sleep.  Lessen any stress in your life.  Keep lights dim if bright lights bother you or make your migraines worse. GET HELP IF:  Medicine does not help your migraines.  Your pain keeps coming back.  You have a fever. GET HELP RIGHT AWAY IF:   Your migraine becomes really bad.  You have a stiff neck.  You have trouble seeing.  Your muscles are weak, or you lose muscle control.  You lose your balance or have trouble walking.  You feel like you will pass out (faint), or you pass out.  You have really bad symptoms that are different than your first symptoms. MAKE SURE YOU:   Understand these instructions.  Will watch your condition.  Will get help right away if you are not doing well or get worse. Document Released: 03/23/2008 Document Revised: 06/19/2013 Document Reviewed: 02/19/2013 [facility] Patient Information 2015 [place], [place]. This information is not intended to replace advice given to you by your health care provider. Make sure you discuss any questions you have with your health care provider.

## 2014-12-20 ENCOUNTER — Emergency Department (HOSPITAL_COMMUNITY)
Admission: EM | Admit: 2014-12-20 | Discharge: 2014-12-20 | Disposition: A | Discharge status: Home / Self Care | Payer: Medicaid Other | Attending: Emergency Medicine | Admitting: Emergency Medicine

## 2014-12-20 ENCOUNTER — Encounter (HOSPITAL_COMMUNITY): Payer: Self-pay | Admitting: *Deleted

## 2014-12-20 DIAGNOSIS — G43809 Other migraine, not intractable, without status migrainosus: Secondary | ICD-10-CM

## 2014-12-20 DIAGNOSIS — R51 Headache: Secondary | ICD-10-CM | POA: Diagnosis present

## 2014-12-20 DIAGNOSIS — Z79899 Other long term (current) drug therapy: Secondary | ICD-10-CM | POA: Insufficient documentation

## 2014-12-20 DIAGNOSIS — N926 Irregular menstruation, unspecified: Secondary | ICD-10-CM | POA: Insufficient documentation

## 2014-12-20 DIAGNOSIS — Z8619 Personal history of other infectious and parasitic diseases: Secondary | ICD-10-CM | POA: Insufficient documentation

## 2014-12-20 DIAGNOSIS — Z793 Long term (current) use of hormonal contraceptives: Secondary | ICD-10-CM | POA: Diagnosis not present

## 2014-12-20 MED ORDER — DIPHENHYDRAMINE HCL 50 MG/ML IJ SOLN
25.0000 mg | Freq: Once | INTRAMUSCULAR | Status: AC
Start: 2014-12-20 — End: 2014-12-20
  Administered 2014-12-20: 25 mg via INTRAMUSCULAR
  Filled 2014-12-20: qty 1

## 2014-12-20 MED ORDER — TRAMADOL HCL 50 MG PO TABS: 50.0000 mg | ORAL_TABLET | Freq: Four times a day (QID) | ORAL | Status: DC | PRN

## 2014-12-20 MED ORDER — KETOROLAC TROMETHAMINE 60 MG/2ML IM SOLN
60.0000 mg | Freq: Once | INTRAMUSCULAR | Status: AC
  Administered 2014-12-20: 60 mg via INTRAMUSCULAR
  Filled 2014-12-20: qty 2

## 2014-12-20 MED ORDER — PROCHLORPERAZINE EDISYLATE 5 MG/ML IJ SOLN
10.0000 mg | Freq: Once | INTRAMUSCULAR | Status: AC
  Administered 2014-12-20: 10 mg via INTRAMUSCULAR
  Filled 2014-12-20: qty 2

## 2014-12-20 MED ORDER — IBUPROFEN 800 MG PO TABS: 800.0000 mg | ORAL_TABLET | Freq: Three times a day (TID) | ORAL | Status: DC

## 2014-12-20 MED ORDER — PROCHLORPERAZINE MALEATE 10 MG PO TABS: 10.0000 mg | ORAL_TABLET | Freq: Two times a day (BID) | ORAL | Status: DC | PRN

## 2014-12-20 MED ORDER — DEXAMETHASONE SODIUM PHOSPHATE 10 MG/ML IJ SOLN
10.0000 mg | Freq: Once | INTRAMUSCULAR | Status: AC
  Administered 2014-12-20: 10 mg via INTRAMUSCULAR
  Filled 2014-12-20: qty 1

## 2014-12-20 NOTE — Discharge Instructions (Signed)

## 2014-12-20 NOTE — ED Notes (Addendum)
Pt states headache returned shortly after leaving the ED. Yesterday. Denies vomiting, only nausea. Headache located mostly to frontal and left temporal area.

## 2014-12-20 NOTE — ED Notes (Signed)
Pt seen here in the ED yesterday for a headache. Pt states headache came back yesterday & worse this morning.

## 2014-12-20 NOTE — ED Provider Notes (Signed)
CSN: [id number]     Arrival date & time 12/20/14  5465 History   First MD Initiated Contact with Patient 12/20/14 [phone]     Chief Complaint  Patient presents with  . Headache     (Consider location/radiation/quality/duration/timing/severity/associated sxs/prior Treatment) HPI Comments: Patient presents to the emergency department for evaluation of headache. Patient was seen yesterday for headache. She is experiencing a migraine headache that is similar to previous migraines. She reports frontal headache that is throbbing and sharp with light sensitivity. She has had nausea but no vomiting. No fever no neck pain. Patient was seen and treated in the ER with improvement, but headache returned. She tried to see her doctor, was told that she could not be seen within 24 hours of an emergency department visit because of her Medicaid. She tried to go to work but had to leave.  Patient is a 29 y.o. female presenting with headaches.  Headache Associated symptoms: photophobia     Past Medical History  Diagnosis Date  . Medical history non-contributory   . Herpes simplex without mention of complication   . Nexplanon insertion 08/13/2013    08/13/13 inserted left arm, remove 08/13/16  . Abnormal menstrual periods 02/12/2014   Past Surgical History  Procedure Laterality Date  . No past surgeries     Family History  Problem Relation Age of Onset  . Diabetes Maternal Grandmother   . Cancer Paternal Grandfather     prancreatic  . COPD Maternal Grandfather   . Cancer Maternal Grandfather     prostate  . Asthma Maternal Grandfather   . Asthma Son   . Bronchitis Son    History  Substance Use Topics  . Smoking status: Never Smoker   . Smokeless tobacco: Never Used  . Alcohol Use: No   OB History    Gravida Para Term Preterm AB TAB SAB Ectopic Multiple Living   2 2 1       2      Review of Systems  Eyes: Positive for photophobia.  Neurological: Positive for headaches.  All other systems  reviewed and are negative.     Allergies  Review of patient's allergies indicates no known allergies.  Home Medications   Prior to Admission medications   Medication Sig Start Date End Date Taking? Authorizing Provider  acetaminophen (TYLENOL) 500 MG tablet Take 500 mg by mouth as needed for pain.   Yes Historical Provider, MD  acyclovir (ZOVIRAX) 400 MG tablet Take 1 tablet (400 mg total) by mouth 3 (three) times daily. 04/17/13  Yes Cheral Marker, CNM  etonogestrel (NEXPLANON) 68 MG IMPL implant Inject 1 each into the skin once.   Yes Historical Provider, MD  Fe Cbn-Fe Gluc-FA-B12-C-DSS (FERRALET 90) 90-1 MG TABS Take 1 daily 02/12/14  Yes Adline Potter, NP  ferrous sulfate 325 (65 FE) MG tablet Take 1 tablet (325 mg total) by mouth 2 (two) times daily with a meal. 04/17/13  Yes Cheral Marker, CNM  megestrol (MEGACE) 40 MG tablet TAKE 3 TABS BY MOUTH x FIVE DAYS; THEN 2 TABS x FIVE DAYS; THEN 1 TABDAILY THEREAFTER. 08/14/14  Yes Adline Potter, NP  omeprazole (PRILOSEC) 20 MG capsule Take 1 capsule (20 mg total) by mouth daily. 1 tablet a day 03/27/13  Yes Lazaro Arms, MD   BP 122/87 mmHg  Pulse 65  Temp(Src) 97.6 F (36.4 C) (Oral)  Resp 20  Ht 5\' 9"  (1.753 m)  Wt 120 lb (54.432 kg)  BMI  17.71 kg/m2  SpO2 100%  LMP 10/28/2014 Physical Exam  Constitutional: She is oriented to person, place, and time. She appears well-developed and well-nourished. No distress.  HENT:  Head: Normocephalic and atraumatic.  Right Ear: Hearing normal.  Left Ear: Hearing normal.  Nose: Nose normal.  Mouth/Throat: Oropharynx is clear and moist and mucous membranes are normal.  Eyes: Conjunctivae and EOM are normal. Pupils are equal, round, and reactive to light.  Neck: Normal range of motion. Neck supple.  Cardiovascular: Regular rhythm, S1 normal and S2 normal.  Exam reveals no gallop and no friction rub.   No murmur heard. Pulmonary/Chest: Effort normal and breath sounds normal.  No respiratory distress. She exhibits no tenderness.  Abdominal: Soft. Normal appearance and bowel sounds are normal. There is no hepatosplenomegaly. There is no tenderness. There is no rebound, no guarding, no tenderness at McBurney's point and negative Murphy's sign. No hernia.  Musculoskeletal: Normal range of motion.  Neurological: She is alert and oriented to person, place, and time. She has normal strength. No cranial nerve deficit or sensory deficit. Coordination normal. GCS eye subscore is 4. GCS verbal subscore is 5. GCS motor subscore is 6.  Skin: Skin is warm, dry and intact. No rash noted. No cyanosis.  Psychiatric: She has a normal mood and affect. Her speech is normal and behavior is normal. Thought content normal.  Nursing note and vitals reviewed.   ED Course  Procedures (including critical care time) Labs Review Labs Reviewed - No data to display  Imaging Review No results found.   EKG Interpretation None      MDM   Final diagnoses:  None  migraine  Patient presents to the emergency department for evaluation of migraine headache. Patient is currently experiencing a headache that is similar to previous migraines. Patient does not have any unusual features compared to previous migraines. Patient has normal neurologic examination. There are no unusual features, such as unusual intensity or sudden onset. As this headache is similar to previous migraines, there is no concern for subarachnoid hemorrhage or other etiology. Patient therefore does not require imaging. Patient treated as migraine headache.   Gilda Crease, MD 12/20/14 [phone]

## 2014-12-22 ENCOUNTER — Emergency Department (HOSPITAL_COMMUNITY): Payer: Medicaid Other

## 2014-12-22 ENCOUNTER — Emergency Department (HOSPITAL_COMMUNITY)
Admission: EM | Admit: 2014-12-22 | Discharge: 2014-12-22 | Disposition: A | Discharge status: Home / Self Care | Payer: Medicaid Other | Attending: Emergency Medicine | Admitting: Emergency Medicine

## 2014-12-22 ENCOUNTER — Encounter (HOSPITAL_COMMUNITY): Payer: Self-pay | Admitting: *Deleted

## 2014-12-22 DIAGNOSIS — R110 Nausea: Secondary | ICD-10-CM | POA: Diagnosis not present

## 2014-12-22 DIAGNOSIS — Z8619 Personal history of other infectious and parasitic diseases: Secondary | ICD-10-CM | POA: Insufficient documentation

## 2014-12-22 DIAGNOSIS — Z8742 Personal history of other diseases of the female genital tract: Secondary | ICD-10-CM | POA: Insufficient documentation

## 2014-12-22 DIAGNOSIS — R51 Headache: Secondary | ICD-10-CM | POA: Diagnosis not present

## 2014-12-22 DIAGNOSIS — R519 Headache, unspecified: Secondary | ICD-10-CM

## 2014-12-22 DIAGNOSIS — Z79899 Other long term (current) drug therapy: Secondary | ICD-10-CM | POA: Diagnosis not present

## 2014-12-22 MED ORDER — SODIUM CHLORIDE 0.9 % IV SOLN: 1000.0000 mL | INTRAVENOUS | Status: DC

## 2014-12-22 MED ORDER — PREDNISONE 50 MG PO TABS: 50.0000 mg | ORAL_TABLET | Freq: Every day | ORAL | Status: DC

## 2014-12-22 MED ORDER — SODIUM CHLORIDE 0.9 % IV SOLN
1000.0000 mL | Freq: Once | INTRAVENOUS | Status: AC
  Administered 2014-12-22: 1000 mL via INTRAVENOUS

## 2014-12-22 MED ORDER — PROCHLORPERAZINE EDISYLATE 5 MG/ML IJ SOLN
10.0000 mg | Freq: Once | INTRAMUSCULAR | Status: AC
  Administered 2014-12-22: 10 mg via INTRAVENOUS
  Filled 2014-12-22: qty 2

## 2014-12-22 MED ORDER — DIPHENHYDRAMINE HCL 50 MG/ML IJ SOLN
25.0000 mg | Freq: Once | INTRAMUSCULAR | Status: AC
  Administered 2014-12-22: 25 mg via INTRAVENOUS
  Filled 2014-12-22: qty 1

## 2014-12-22 MED ORDER — DEXAMETHASONE SODIUM PHOSPHATE 10 MG/ML IJ SOLN
10.0000 mg | Freq: Once | INTRAMUSCULAR | Status: AC
  Administered 2014-12-22: 10 mg via INTRAVENOUS
  Filled 2014-12-22: qty 1

## 2014-12-22 MED ORDER — KETOROLAC TROMETHAMINE 30 MG/ML IJ SOLN
30.0000 mg | Freq: Once | INTRAMUSCULAR | Status: AC
  Administered 2014-12-22: 30 mg via INTRAVENOUS
  Filled 2014-12-22: qty 1

## 2014-12-22 NOTE — ED Provider Notes (Signed)
CSN: [id number]     Arrival date & time 12/22/14  4098 History   First MD Initiated Contact with Patient 12/22/14 [phone]     Chief Complaint  Patient presents with  . Migraine     (Consider location/radiation/quality/duration/timing/severity/associated sxs/prior Treatment) Patient is a 29 y.o. female presenting with migraines.  Migraine  She complains of a frontal headache which started yesterday. Pain is sharp, and worse with exposure to loud noise. There is associated nausea, but no vomiting. She denies fever, chills, sweats. There is no photophobia. She's been having similar headaches for the last several weeks and had been seen twice in the ED. Each time, she got temporary relief of her pain in the ED but headache would recur fairly soon after being discharged. She had been given a prescription for prochlorperazine when she was discharged last time but this has not helped her at home. She has had similar headaches in the remote past.  Past Medical History  Diagnosis Date  . Medical history non-contributory   . Herpes simplex without mention of complication   . Nexplanon insertion 08/13/2013    08/13/13 inserted left arm, remove 08/13/16  . Abnormal menstrual periods 02/12/2014   Past Surgical History  Procedure Laterality Date  . No past surgeries     Family History  Problem Relation Age of Onset  . Diabetes Maternal Grandmother   . Cancer Paternal Grandfather     prancreatic  . COPD Maternal Grandfather   . Cancer Maternal Grandfather     prostate  . Asthma Maternal Grandfather   . Asthma Son   . Bronchitis Son    History  Substance Use Topics  . Smoking status: Never Smoker   . Smokeless tobacco: Never Used  . Alcohol Use: No   OB History    Gravida Para Term Preterm AB TAB SAB Ectopic Multiple Living   Review of Systems  All other systems reviewed and are negative.     Allergies  Review of patient's allergies indicates no known  allergies.  Home Medications   Prior to Admission medications   Medication Sig Start Date End Date Taking? Authorizing Provider  acetaminophen (TYLENOL) 500 MG tablet Take 500 mg by mouth as needed for pain.   Yes Historical Provider, MD  acyclovir (ZOVIRAX) 400 MG tablet Take 1 tablet (400 mg total) by mouth 3 (three) times daily. 04/17/13  Yes Cheral Marker, CNM  etonogestrel (NEXPLANON) 68 MG IMPL implant Inject 1 each into the skin once.   Yes Historical Provider, MD  Fe Cbn-Fe Gluc-FA-B12-C-DSS (FERRALET 90) 90-1 MG TABS Take 1 daily 02/12/14  Yes Adline Potter, NP  ferrous sulfate 325 (65 FE) MG tablet Take 1 tablet (325 mg total) by mouth 2 (two) times daily with a meal. 04/17/13  Yes Cheral Marker, CNM  ibuprofen (ADVIL,MOTRIN) 800 MG tablet Take 1 tablet (800 mg total) by mouth 3 (three) times daily. 12/20/14  Yes Gilda Crease, MD  megestrol (MEGACE) 40 MG tablet TAKE 3 TABS BY MOUTH x FIVE DAYS; THEN 2 TABS x FIVE DAYS; THEN 1 TABDAILY THEREAFTER. 08/14/14  Yes Adline Potter, NP  omeprazole (PRILOSEC) 20 MG capsule Take 1 capsule (20 mg total) by mouth daily. 1 tablet a day 03/27/13  Yes Lazaro Arms, MD  prochlorperazine (COMPAZINE) 10 MG tablet Take 1 tablet (10 mg total) by mouth 2 (two) times daily as needed (migraine).  12/20/14  Yes Gilda Crease, MD  traMADol (ULTRAM) 50 MG tablet Take 1 tablet (50 mg total) by mouth every 6 (six) hours as needed. 12/20/14  Yes Gilda Crease, MD   BP 123/97 mmHg  Pulse 82  Temp(Src) 97.7 F (36.5 C) (Oral)  Resp 16  Ht 5\' 9"  (1.753 m)  Wt 120 lb (54.432 kg)  BMI 17.71 kg/m2  SpO2 100%  LMP 10/28/2014 Physical Exam  Nursing note and vitals reviewed.  29 year old female, resting comfortably and in no acute distress. Vital signs are significant for hypertension. Oxygen saturation is 100%, which is normal. Head is normocephalic and atraumatic. PERRLA, EOMI. Oropharynx is clear. There is no sinus  tenderness. Neck is nontender and supple without adenopathy or JVD. Back is nontender and there is no CVA tenderness. Lungs are clear without rales, wheezes, or rhonchi. Chest is nontender. Heart has regular rate and rhythm without murmur. Abdomen is soft, flat, nontender without masses or hepatosplenomegaly and peristalsis is normoactive. Extremities have no cyanosis or edema, full range of motion is present. Skin is warm and dry without rash. Neurologic: Mental status is normal, cranial nerves are intact, there are no motor or sensory deficits.  ED Course  Procedures (including critical care time)  Imaging Review Ct Head Wo Contrast  12/22/2014   CLINICAL DATA:  Subacute onset of headache, nausea and vomiting. Initial encounter.  EXAM: CT HEAD WITHOUT CONTRAST  TECHNIQUE: Contiguous axial images were obtained from the base of the skull through the vertex without intravenous contrast.  COMPARISON:  None.  FINDINGS: There is no evidence of acute infarction, mass lesion, or intra- or extra-axial hemorrhage on CT.  A mild mega cisterna magna is noted. The posterior fossa, including the cerebellum, brainstem and fourth ventricle, is within normal limits. The third and lateral ventricles, and basal ganglia are unremarkable in appearance. The cerebral hemispheres are symmetric in appearance, with normal gray-white differentiation. No mass effect or midline shift is seen.  There is no evidence of fracture; visualized osseous structures are unremarkable in appearance. The visualized portions of the orbits are within normal limits. The paranasal sinuses and mastoid air cells are well-aerated. No significant soft tissue abnormalities are seen.  IMPRESSION: Unremarkable noncontrast CT of the head.   Electronically Signed   By: Roanna Raider M.D.   On: 12/22/2014 05:11    MDM   Final diagnoses:  Headache, unspecified headache type    Recurrent headache which may be a migraine variant. Old records are  reviewed confirming ED visit 2 days ago and 4 days ago. However, no ED visits for headaches prior to that. Given the change in pattern of her headaches, I feel CT of the head is indicated tonight. She is also given a migraine cocktail of IV fluids, prochlorperazine, diphenhydramine, ketorolac, and dexamethasone.  She feels much better after above noted treatment. She had been discharged with prescriptions for prochlorperazine, ibuprofen, and tramadol. She is to continue taking his medications and is given a new prescription for prednisone. She is referred to neurology for follow-up.  Dione Booze, MD 12/22/14 [phone]

## 2014-12-22 NOTE — Discharge Instructions (Signed)
Continue using the medcations prescribed two days ago - prochlorperazine as needed for nausea or headache, Tramadol for pain, and ibuprofen.  General Headache Without Cause A headache is pain or discomfort felt around the head or neck area. The specific cause of a headache may not be found. There are many causes and types of headaches. A few common ones are:  Tension headaches.  Migraine headaches.  Cluster headaches.  Chronic daily headaches. HOME CARE INSTRUCTIONS   Keep all follow-up appointments with your caregiver or any specialist referral.  Only take over-the-counter or prescription medicines for pain or discomfort as directed by your caregiver.  Lie down in a dark, quiet room when you have a headache.  Keep a headache journal to find out what may trigger your migraine headaches. For example, write down:  What you eat and drink.  How much sleep you get.  Any change to your diet or medicines.  Try massage or other relaxation techniques.  Put ice packs or heat on the head and neck. Use these 3 to 4 times per day for 15 to 20 minutes each time, or as needed.  Limit stress.  Sit up straight, and do not tense your muscles.  Quit smoking if you smoke.  Limit alcohol use.  Decrease the amount of caffeine you drink, or stop drinking caffeine.  Eat and sleep on a regular schedule.  Get 7 to 9 hours of sleep, or as recommended by your caregiver.  Keep lights dim if bright lights bother you and make your headaches worse. SEEK MEDICAL CARE IF:   You have problems with the medicines you were prescribed.  Your medicines are not working.  You have a change from the usual headache.  You have nausea or vomiting. SEEK IMMEDIATE MEDICAL CARE IF:   Your headache becomes severe.  You have a fever.  You have a stiff neck.  You have loss of vision.  You have muscular weakness or loss of muscle control.  You start losing your balance or have trouble walking.  You  feel faint or pass out.  You have severe symptoms that are different from your first symptoms. MAKE SURE YOU:   Understand these instructions.  Will watch your condition.  Will get help right away if you are not doing well or get worse. Document Released: 06/14/2005 Document Revised: 09/06/2011 Document Reviewed: 06/30/2011 [facility] Patient Information 2015 [place], [place]. This information is not intended to replace advice given to you by your health care provider. Make sure you discuss any questions you have with your health care provider.  Prednisone tablets What is this medicine? PREDNISONE (PRED ni sone) is a corticosteroid. It is commonly used to treat inflammation of the skin, joints, lungs, and other organs. Common conditions treated include asthma, allergies, and arthritis. It is also used for other conditions, such as blood disorders and diseases of the adrenal glands. This medicine may be used for other purposes; ask your health care provider or pharmacist if you have questions. COMMON BRAND NAME(S): Deltasone, Predone, Sterapred, Sterapred DS What should I tell my health care provider before I take this medicine? They need to know if you have any of these conditions: -Cushing's syndrome -diabetes -glaucoma -heart disease -high blood pressure -infection (especially a virus infection such as chickenpox, cold sores, or herpes) -kidney disease -liver disease -mental illness -myasthenia gravis -osteoporosis -seizures -stomach or intestine problems -thyroid disease -an unusual or allergic reaction to lactose, prednisone, other medicines, foods, dyes, or preservatives -pregnant or trying  to get pregnant -breast-feeding How should I use this medicine? Take this medicine by mouth with a glass of water. Follow the directions on the prescription label. Take this medicine with food. If you are taking this medicine once a day, take it in the morning. Do not take more medicine  than you are told to take. Do not suddenly stop taking your medicine because you may develop a severe reaction. Your doctor will tell you how much medicine to take. If your doctor wants you to stop the medicine, the dose may be slowly lowered over time to avoid any side effects. Talk to your pediatrician regarding the use of this medicine in children. Special care may be needed. Overdosage: If you think you have taken too much of this medicine contact a poison control center or emergency room at once. NOTE: This medicine is only for you. Do not share this medicine with others. What if I miss a dose? If you miss a dose, take it as soon as you can. If it is almost time for your next dose, talk to your doctor or health care professional. You may need to miss a dose or take an extra dose. Do not take double or extra doses without advice. What may interact with this medicine? Do not take this medicine with any of the following medications: -metyrapone -mifepristone This medicine may also interact with the following medications: -aminoglutethimide -amphotericin B -aspirin and aspirin-like medicines -barbiturates -certain medicines for diabetes, like glipizide or glyburide -cholestyramine -cholinesterase inhibitors -cyclosporine -digoxin -diuretics -ephedrine -female hormones, like estrogens and birth control pills -isoniazid -ketoconazole -NSAIDS, medicines for pain and inflammation, like ibuprofen or naproxen -phenytoin -rifampin -toxoids -vaccines -warfarin This list may not describe all possible interactions. Give your health care provider a list of all the medicines, herbs, non-prescription drugs, or dietary supplements you use. Also tell them if you smoke, drink alcohol, or use illegal drugs. Some items may interact with your medicine. What should I watch for while using this medicine? Visit your doctor or health care professional for regular checks on your progress. If you are taking  this medicine over a prolonged period, carry an identification card with your name and address, the type and dose of your medicine, and your doctor's name and address. This medicine may increase your risk of getting an infection. Tell your doctor or health care professional if you are around anyone with measles or chickenpox, or if you develop sores or blisters that do not heal properly. If you are going to have surgery, tell your doctor or health care professional that you have taken this medicine within the last twelve months. Ask your doctor or health care professional about your diet. You may need to lower the amount of salt you eat. This medicine may affect blood sugar levels. If you have diabetes, check with your doctor or health care professional before you change your diet or the dose of your diabetic medicine. What side effects may I notice from receiving this medicine? Side effects that you should report to your doctor or health care professional as soon as possible: -allergic reactions like skin rash, itching or hives, swelling of the face, lips, or tongue -changes in emotions or moods -changes in vision -depressed mood -eye pain -fever or chills, cough, sore throat, pain or difficulty passing urine -increased thirst -swelling of ankles, feet Side effects that usually do not require medical attention (report to your doctor or health care professional if they continue or are bothersome): -confusion,  excitement, restlessness -headache -nausea, vomiting -skin problems, acne, thin and shiny skin -trouble sleeping -weight gain This list may not describe all possible side effects. Call your doctor for medical advice about side effects. You may report side effects to FDA at 1-800-FDA-1088. Where should I keep my medicine? Keep out of the reach of children. Store at room temperature between 15 and 30 degrees C (59 and 86 degrees F). Protect from light. Keep container tightly closed. Throw  away any unused medicine after the expiration date. NOTE: This sheet is a summary. It may not cover all possible information. If you have questions about this medicine, talk to your doctor, pharmacist, or health care provider.  2015, [employer]. (2011-01-28 10:57:14)

## 2014-12-22 NOTE — ED Notes (Signed)
Pt reports her headache returned last night. Pt has been seen in the ER 2 times before for teh same.

## 2015-01-08 ENCOUNTER — Other Ambulatory Visit: Payer: Self-pay | Admitting: Adult Health

## 2015-01-18 ENCOUNTER — Other Ambulatory Visit: Payer: Self-pay | Admitting: Adult Health

## 2015-01-23 ENCOUNTER — Telehealth: Payer: Self-pay | Admitting: Adult Health

## 2015-01-23 MED ORDER — MEGESTROL ACETATE 40 MG PO TABS: ORAL_TABLET | ORAL | Status: DC

## 2015-01-23 NOTE — Telephone Encounter (Signed)
Will refill megace and needs appt

## 2015-01-23 NOTE — Telephone Encounter (Signed)
Pt requesting a refill on Megace 40 mg.

## 2015-06-10 ENCOUNTER — Other Ambulatory Visit: Payer: Self-pay | Admitting: Adult Health

## 2016-03-19 ENCOUNTER — Other Ambulatory Visit (HOSPITAL_COMMUNITY)
Admission: RE | Admit: 2016-03-19 | Discharge: 2016-03-19 | Disposition: A | Discharge status: Home / Self Care | Payer: Medicaid Other | Source: Ambulatory Visit | Attending: Adult Health | Admitting: Adult Health

## 2016-03-19 ENCOUNTER — Encounter: Payer: Self-pay | Admitting: Adult Health

## 2016-03-19 ENCOUNTER — Ambulatory Visit (INDEPENDENT_AMBULATORY_CARE_PROVIDER_SITE_OTHER): Payer: Medicaid Other | Admitting: Adult Health

## 2016-03-19 VITALS — BP 104/70 | HR 78 | Ht 69.25 in | Wt 126.5 lb

## 2016-03-19 DIAGNOSIS — Z01419 Encounter for gynecological examination (general) (routine) without abnormal findings: Secondary | ICD-10-CM | POA: Insufficient documentation

## 2016-03-19 DIAGNOSIS — Z113 Encounter for screening for infections with a predominantly sexual mode of transmission: Secondary | ICD-10-CM | POA: Diagnosis present

## 2016-03-19 DIAGNOSIS — Z0000 Encounter for general adult medical examination without abnormal findings: Secondary | ICD-10-CM | POA: Diagnosis not present

## 2016-03-19 DIAGNOSIS — N926 Irregular menstruation, unspecified: Secondary | ICD-10-CM

## 2016-03-19 DIAGNOSIS — F32A Depression, unspecified: Secondary | ICD-10-CM

## 2016-03-19 DIAGNOSIS — Z124 Encounter for screening for malignant neoplasm of cervix: Secondary | ICD-10-CM

## 2016-03-19 DIAGNOSIS — F329 Major depressive disorder, single episode, unspecified: Secondary | ICD-10-CM

## 2016-03-19 DIAGNOSIS — Z975 Presence of (intrauterine) contraceptive device: Secondary | ICD-10-CM

## 2016-03-19 MED ORDER — MEGESTROL ACETATE 40 MG PO TABS: 40.0000 mg | ORAL_TABLET | Freq: Two times a day (BID) | ORAL | 3 refills | Status: DC

## 2016-03-19 MED ORDER — ESCITALOPRAM OXALATE 10 MG PO TABS: 10.0000 mg | ORAL_TABLET | Freq: Every day | ORAL | 6 refills | Status: DC

## 2016-03-19 NOTE — Progress Notes (Signed)
Patient ID: Tina Fletcher, female   DOB: 1986-05-18, 30 y.o.   MRN: [id number] History of Present Illness: Tina Fletcher is a 30 year old black female in for well woman gyn exam and pap.She is complaining of irregular bleeding with nexplanon, it is on and off, and can be heavy at times.Has used megace in the past, but had lost medicaid and could not afford, has medicaid now.  PCP was at [facility]   Current Medications, Allergies, Past Medical History, Past Surgical History, Family History and Social History were reviewed in [employer] record.     Review of Systems: Patient denies any hearing loss, fatigue, blurred vision, shortness of breath, chest pain, abdominal pain, problems with bowel movements, urination, or intercourse. No joint pain or mood swings.Has migraines, + irregular bleeding.Has felt depressed at times, but denies any suicidal ideations.    Physical Exam:BP 104/70 (BP Location: Left Arm, Patient Position: Sitting, Cuff Size: Normal)   Pulse 78   Ht 5' 9.25" (1.759 m)   Wt 126 lb 8 oz (57.4 kg)   LMP 02/27/2016 (Approximate)   BMI 18.55 kg/m  General:  Well developed, well nourished, no acute distress Skin:  Warm and dry Neck:  Midline trachea, normal thyroid, good ROM, no lymphadenopathy Lungs; Clear to auscultation bilaterally Breast:  No dominant palpable mass, retraction, or nipple discharge Cardiovascular: Regular rate and rhythm Abdomen:  Soft, non tender, no hepatosplenomegaly Pelvic:  External genitalia is normal in appearance, no lesions.  The vagina is normal in appearance, with period like blood. Urethra has no lesions or masses. The cervix is bulbous. Pap with GC/CHL performed. Uterus is felt to be normal size, shape, and contour.  No adnexal masses or tenderness noted.Bladder is non tender, no masses felt. Extremities/musculoskeletal:  No swelling or varicosities noted, no clubbing or cyanosis.Nexplanon easily palpated left  arm Psych:  No mood changes, alert and cooperative,seems happy PHQ 9 score 8.Discussed try meds and she agrees.  Impression: 1. Encounter for gynecological examination with Papanicolaou smear of cervix   2. Routine cervical smear   3. Irregular bleeding   4. Nexplanon in place   5. Depression       Plan: Rx megace 40 mg #60 take 1 bid with 3 refills Rx lexapro 10 mg 330 take 1/2 tab for 3-4 days then 1 daily with 6 refills Check CBC,CMP,TSH and lipids,A1c and vitamin D Follow up in 2 months for ROS Physical in 1 year, pap in 3 if normal Wants nexplanon replaced in February will, order at F/U

## 2016-03-22 LAB — COMPREHENSIVE METABOLIC PANEL
ALT: 21 IU/L (ref 0–32)
AST: 30 IU/L (ref 0–40)
Albumin/Globulin Ratio: 1.9 (ref 1.2–2.2)
Albumin: 4.9 g/dL (ref 3.5–5.5)
Alkaline Phosphatase: 62 IU/L (ref 39–117)
BUN/Creatinine Ratio: 19 (ref 9–23)
BUN: 18 mg/dL (ref 6–20)
Bilirubin Total: 0.8 mg/dL (ref 0.0–1.2)
CO2: 21 mmol/L (ref 18–29)
Calcium: 9.4 mg/dL (ref 8.7–10.2)
Chloride: 98 mmol/L (ref 96–106)
Creatinine, Ser: 0.94 mg/dL (ref 0.57–1.00)
GFR calc Af Amer: 95 mL/min/{1.73_m2} (ref >59)
GFR calc non Af Amer: 83 mL/min/{1.73_m2} (ref >59)
Globulin, Total: 2.6 g/dL (ref 1.5–4.5)
Glucose: 90 mg/dL (ref 65–99)
Potassium: 4 mmol/L (ref 3.5–5.2)
Sodium: 139 mmol/L (ref 134–144)
Total Protein: 7.5 g/dL (ref 6.0–8.5)

## 2016-03-22 LAB — CBC
Hematocrit: 39.2 % (ref 34.0–46.6)
Hemoglobin: 12.7 g/dL (ref 11.1–15.9)
MCH: 29.3 pg (ref 26.6–33.0)
MCHC: 32.4 g/dL (ref 31.5–35.7)
MCV: 90 fL (ref 79–97)
Platelets: 222 10*3/uL (ref 150–379)
RBC: 4.34 x10E6/uL (ref 3.77–5.28)
RDW: 14.1 % (ref 12.3–15.4)
WBC: 6.7 10*3/uL (ref 3.4–10.8)

## 2016-03-22 LAB — VITAMIN D 25 HYDROXY (VIT D DEFICIENCY, FRACTURES): Vit D, 25-Hydroxy: 13.9 ng/mL — ABNORMAL LOW (ref 30.0–100.0)

## 2016-03-22 LAB — CYTOLOGY - PAP

## 2016-03-22 LAB — LIPID PANEL
Chol/HDL Ratio: 2.3 ratio units (ref 0.0–4.4)
Cholesterol, Total: 180 mg/dL (ref 100–199)
HDL: 80 mg/dL (ref >39)
LDL Calculated: 68 mg/dL (ref 0–99)
Triglycerides: 158 mg/dL — ABNORMAL HIGH (ref 0–149)
VLDL Cholesterol Cal: 32 mg/dL (ref 5–40)

## 2016-03-22 LAB — HEMOGLOBIN A1C
Est. average glucose Bld gHb Est-mCnc: 97 mg/dL
Hgb A1c MFr Bld: 5 % (ref 4.8–5.6)

## 2016-03-22 LAB — TSH: TSH: 0.881 u[IU]/mL (ref 0.450–4.500)

## 2016-04-01 ENCOUNTER — Encounter: Payer: Self-pay | Admitting: Adult Health

## 2016-04-01 DIAGNOSIS — E559 Vitamin D deficiency, unspecified: Secondary | ICD-10-CM

## 2016-04-01 HISTORY — DX: Vitamin D deficiency, unspecified: E55.9

## 2016-05-19 ENCOUNTER — Ambulatory Visit: Payer: Medicaid Other | Admitting: Adult Health

## 2016-05-19 ENCOUNTER — Encounter: Payer: Self-pay | Admitting: Adult Health

## 2016-08-27 ENCOUNTER — Encounter (HOSPITAL_COMMUNITY): Payer: Self-pay | Admitting: *Deleted

## 2016-08-27 ENCOUNTER — Emergency Department (HOSPITAL_COMMUNITY)
Admission: EM | Admit: 2016-08-27 | Discharge: 2016-08-28 | Disposition: A | Discharge status: Home / Self Care | Payer: Medicaid Other | Attending: Emergency Medicine | Admitting: Emergency Medicine

## 2016-08-27 DIAGNOSIS — E876 Hypokalemia: Secondary | ICD-10-CM | POA: Insufficient documentation

## 2016-08-27 DIAGNOSIS — N946 Dysmenorrhea, unspecified: Secondary | ICD-10-CM

## 2016-08-27 DIAGNOSIS — R1032 Left lower quadrant pain: Secondary | ICD-10-CM | POA: Diagnosis present

## 2016-08-27 LAB — CBC WITH DIFFERENTIAL/PLATELET
Basophils Absolute: 0 10*3/uL (ref 0.0–0.1)
Basophils Relative: 1 %
Eosinophils Absolute: 0.1 10*3/uL (ref 0.0–0.7)
Eosinophils Relative: 1 %
HCT: 38.3 % (ref 36.0–46.0)
Hemoglobin: 13.1 g/dL (ref 12.0–15.0)
Lymphocytes Relative: 58 %
Lymphs Abs: 3 10*3/uL (ref 0.7–4.0)
MCH: 29.5 pg (ref 26.0–34.0)
MCHC: 34.2 g/dL (ref 30.0–36.0)
MCV: 86.3 fL (ref 78.0–100.0)
Monocytes Absolute: 0.3 10*3/uL (ref 0.1–1.0)
Monocytes Relative: 6 %
Neutro Abs: 1.8 10*3/uL (ref 1.7–7.7)
Neutrophils Relative %: 34 %
Platelets: 185 10*3/uL (ref 150–400)
RBC: 4.44 MIL/uL (ref 3.87–5.11)
RDW: 13 % (ref 11.5–15.5)
WBC: 5.2 10*3/uL (ref 4.0–10.5)

## 2016-08-27 LAB — URINALYSIS, ROUTINE W REFLEX MICROSCOPIC
Bacteria, UA: NONE SEEN
Bilirubin Urine: NEGATIVE
Glucose, UA: NEGATIVE mg/dL
Ketones, ur: NEGATIVE mg/dL
Leukocytes, UA: NEGATIVE
Nitrite: NEGATIVE
Protein, ur: NEGATIVE mg/dL
Specific Gravity, Urine: 1.013 (ref 1.005–1.030)
pH: 6 (ref 5.0–8.0)

## 2016-08-27 LAB — COMPREHENSIVE METABOLIC PANEL
ALT: 23 U/L (ref 14–54)
AST: 44 U/L — ABNORMAL HIGH (ref 15–41)
Albumin: 4.5 g/dL (ref 3.5–5.0)
Alkaline Phosphatase: 48 U/L (ref 38–126)
Anion gap: 11 (ref 5–15)
BUN: 14 mg/dL (ref 6–20)
CO2: 24 mmol/L (ref 22–32)
Calcium: 8.5 mg/dL — ABNORMAL LOW (ref 8.9–10.3)
Chloride: 104 mmol/L (ref 101–111)
Creatinine, Ser: 0.75 mg/dL (ref 0.44–1.00)
GFR calc Af Amer: >60 mL/min (ref >60)
GFR calc non Af Amer: >60 mL/min (ref >60)
Glucose, Bld: 94 mg/dL (ref 65–99)
Potassium: 2.9 mmol/L — ABNORMAL LOW (ref 3.5–5.1)
Sodium: 139 mmol/L (ref 135–145)
Total Bilirubin: 0.9 mg/dL (ref 0.3–1.2)
Total Protein: 7.6 g/dL (ref 6.5–8.1)

## 2016-08-27 LAB — WET PREP, GENITAL
Clue Cells Wet Prep HPF POC: NONE SEEN
Sperm: NONE SEEN
Trich, Wet Prep: NONE SEEN
WBC, Wet Prep HPF POC: NONE SEEN
Yeast Wet Prep HPF POC: NONE SEEN

## 2016-08-27 LAB — PREGNANCY, URINE: Preg Test, Ur: NEGATIVE

## 2016-08-27 MED ORDER — ONDANSETRON HCL 4 MG/2ML IJ SOLN
4.0000 mg | Freq: Once | INTRAMUSCULAR | Status: AC
  Administered 2016-08-27: 4 mg via INTRAVENOUS
  Filled 2016-08-27: qty 2

## 2016-08-27 MED ORDER — POTASSIUM CHLORIDE CRYS ER 20 MEQ PO TBCR
40.0000 meq | EXTENDED_RELEASE_TABLET | Freq: Once | ORAL | Status: AC
  Administered 2016-08-27: 40 meq via ORAL
  Filled 2016-08-27: qty 2

## 2016-08-27 NOTE — ED Triage Notes (Signed)
Pt reports intermittent lower abdominal pain and n/v x 2 days. Pt has taken tylenol earlier but it did not help the pain.

## 2016-08-27 NOTE — ED Triage Notes (Signed)
Pt reports abd pain for the last 2 days  Dr Gerda Diss is her physician Dr Letitia Caul her GYN

## 2016-08-27 NOTE — ED Notes (Addendum)
Wet prep and pelvic cart to bedside.

## 2016-08-27 NOTE — ED Provider Notes (Signed)
[facility]-EMER[facility]Y DEPT Provider Note   CSN: [id number] Arrival date & time: 08/27/16  2049     History   Chief Complaint Chief Complaint  Patient presents with  . Abdominal Pain    HPI Tina Fletcher is a 31 y.o. female presenting with a 2-3 day history of lower bilateral sharp intermittent pain which is described as "like labor pains".  She has just completed her last menstrual period today and has been regular with no complaint of prior problems with significant menstrual pain.  She is sexually active with one partner.   Her nexplanon was scheduled to be removed last month and she is using no other form of birth control.  She has had approximately 4 episodes of emesis since the pain episodes began.  She denies dysuria, back pain, diarrhea, also denies fevers or chills, denies vaginal discharge. Her last bm was this am and normal.  Episodes of pain last several minutes and denies pain at this time. She has taken tylenol without relief.   The history is provided by the patient.    Past Medical History:  Diagnosis Date  . Abnormal menstrual periods 02/12/2014  . Herpes simplex without mention of complication   . Medical history non-contributory   . Nexplanon insertion 08/13/2013   08/13/13 inserted left arm, remove 08/13/16  . Vitamin D deficiency 04/01/2016    Patient Active Problem List   Diagnosis Date Noted  . Vitamin D deficiency 04/01/2016  . Abnormal menstrual periods 02/12/2014  . Nexplanon insertion 08/13/2013  . Rapid labor w/ first baby 05/15/2013  . Uterine size-date discrepancy in third trimester 05/15/2013  . Anemia during pregnancy 04/17/2013  . Thrombocytopenia (HCC) 04/17/2013  . Supervision of other normal pregnancy 01/08/2013  . 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 Underweight 01/08/2013    Past Surgical History:  Procedure Laterality Date  . NO PAST SURGERIES      OB History    Gravida Para Term Preterm AB Living   2 2 1     2    SAB TAB Ectopic  Multiple Live Births           2       Home Medications    Prior to Admission medications   Medication Sig Start Date End Date Taking? Authorizing Provider  etonogestrel (NEXPLANON) 68 MG IMPL implant Inject 1 each into the skin once.   Yes Historical Provider, MD  naproxen (NAPROSYN) 500 MG tablet Take 1 tablet (500 mg total) by mouth 2 (two) times daily as needed for moderate pain. 08/28/16   Burgess Amor, PA-C  ondansetron (ZOFRAN ODT) 4 MG disintegrating tablet Take 1 tablet (4 mg total) by mouth every 8 (eight) hours as needed for nausea or vomiting. 08/28/16   Burgess Amor, PA-C  potassium chloride SA (K-DUR,KLOR-CON) 20 MEQ tablet Take 1 tablet (20 mEq total) by mouth 2 (two) times daily. 08/28/16   Burgess Amor, PA-C    Family History Family History  Problem Relation Age of Onset  . Diabetes Maternal Grandmother   . Cancer Paternal Grandfather     prancreatic  . COPD Maternal Grandfather   . Cancer Maternal Grandfather     prostate  . Asthma Maternal Grandfather   . Asthma Son   . Bronchitis Son     Social History Social History  Substance Use Topics  . Smoking status: Never Smoker  . Smokeless tobacco: Never Used  . Alcohol use No     Allergies   Patient has no known  allergies.   Review of Systems Review of Systems  Constitutional: Negative for fever.  HENT: Negative for congestion and sore throat.   Eyes: Negative.   Respiratory: Negative for chest tightness and shortness of breath.   Cardiovascular: Negative for chest pain.  Gastrointestinal: Positive for abdominal pain, nausea and vomiting. Negative for constipation and diarrhea.  Genitourinary: Negative.   Musculoskeletal: Negative for arthralgias, joint swelling and neck pain.  Skin: Negative.  Negative for rash and wound.  Neurological: Negative for dizziness, weakness, light-headedness, numbness and headaches.  Psychiatric/Behavioral: Negative.      Physical Exam Updated Vital Signs BP 118/85   Pulse  86   Temp 98.1 F (36.7 C) (Oral)   Resp 18   LMP 08/23/2016   SpO2 97%   Physical Exam  Constitutional: She appears well-developed and well-nourished.  HENT:  Head: Normocephalic and atraumatic.  Eyes: Conjunctivae are normal.  Neck: Normal range of motion.  Cardiovascular: Normal rate, regular rhythm, normal heart sounds and intact distal pulses.   Pulmonary/Chest: Effort normal and breath sounds normal. She has no wheezes.  Abdominal: Soft. Bowel sounds are normal. There is no tenderness.  Genitourinary: Vagina normal. Uterus is not tender. Cervix exhibits no motion tenderness and no discharge. Right adnexum displays no mass, no tenderness and no fullness. Left adnexum displays no mass, no tenderness and no fullness. No tenderness in the vagina. No vaginal discharge found.  Musculoskeletal: Normal range of motion.  Neurological: She is alert.  Skin: Skin is warm and dry.  Psychiatric: She has a normal mood and affect.  Nursing note and vitals reviewed.    ED Treatments / Results  Labs (all labs ordered are listed, but only abnormal results are displayed) Labs Reviewed  COMPREHENSIVE METABOLIC PANEL - Abnormal; Notable for the following:       Result Value   Potassium 2.9 (*)    Calcium 8.5 (*)    AST 44 (*)    All other components within normal limits  URINALYSIS, ROUTINE W REFLEX MICROSCOPIC - Abnormal; Notable for the following:    Hgb urine dipstick SMALL (*)    All other components within normal limits  WET PREP, GENITAL  CBC WITH DIFFERENTIAL/PLATELET  PREGNANCY, URINE  HIV ANTIBODY (ROUTINE TESTING)  GC/CHLAMYDIA PROBE AMP ([place]) NOT AT [facility] [place]    EKG  EKG Interpretation None       Radiology No results found.  Procedures Procedures (including critical care time)  Medications Ordered in ED Medications  ondansetron (ZOFRAN) injection 4 mg (4 mg Intravenous Given 08/27/16 2133)  potassium chloride SA (K-DUR,KLOR-CON) CR tablet 40 mEq (40 mEq Oral  Given 08/27/16 2233)     Initial Impression / Assessment and Plan / ED Course  I have reviewed the triage vital signs and the nursing notes.  Pertinent labs & imaging results that were available during my care of the patient were reviewed by me and considered in my medical decision making (see chart for details).    Pelvic pain associated with menses, labs reviewed and normal, except for hypokalemia.  She was given potassium supplement 40 meq here, prescription for same.  Advised nsaids prn.  zofran prescribed for nausea.  Abdominal exam with no acute findings, no rlq pain suggesting appendicitis.  Advised recheck of sx for any persistent or worsened pain.  Final Clinical Impressions(s) / ED Diagnoses   Final diagnoses:  Hypokalemia  Dysmenorrhea    New Prescriptions New Prescriptions   NAPROXEN (NAPROSYN) 500 MG TABLET  Take 1 tablet (500 mg total) by mouth 2 (two) times daily as needed for moderate pain.   ONDANSETRON (ZOFRAN ODT) 4 MG DISINTEGRATING TABLET    Take 1 tablet (4 mg total) by mouth every 8 (eight) hours as needed for nausea or vomiting.   POTASSIUM CHLORIDE SA (K-DUR,KLOR-CON) 20 MEQ TABLET    Take 1 tablet (20 mEq total) by mouth 2 (two) times daily.     Burgess Amor, PA-C 08/28/16 0010    Burgess Amor, PA-C 08/28/16 [id number]    Margarita Grizzle, MD 09/05/16 [phone]

## 2016-08-28 MED ORDER — NAPROXEN 500 MG PO TABS: 500.0000 mg | ORAL_TABLET | Freq: Two times a day (BID) | ORAL | 0 refills | Status: DC | PRN

## 2016-08-28 MED ORDER — ONDANSETRON 4 MG PO TBDP: 4.0000 mg | ORAL_TABLET | Freq: Three times a day (TID) | ORAL | 0 refills | Status: DC | PRN

## 2016-08-28 MED ORDER — POTASSIUM CHLORIDE CRYS ER 20 MEQ PO TBCR: 20.0000 meq | EXTENDED_RELEASE_TABLET | Freq: Two times a day (BID) | ORAL | 0 refills | Status: DC

## 2016-08-28 NOTE — ED Notes (Signed)
Pt alert & oriented x4, stable gait. Patient given discharge instructions, paperwork & prescription(s). Patient  instructed to stop at the registration desk to finish any additional paperwork. Patient verbalized understanding. Pt left department w/ no further questions. 

## 2016-08-29 LAB — HIV ANTIBODY (ROUTINE TESTING W REFLEX): HIV Screen 4th Generation wRfx: NONREACTIVE

## 2016-08-30 LAB — GC/CHLAMYDIA PROBE AMP (~~LOC~~) NOT AT ARMC
Chlamydia: NEGATIVE
Neisseria Gonorrhea: NEGATIVE

## 2016-09-08 ENCOUNTER — Encounter: Payer: Medicaid Other | Admitting: Advanced Practice Midwife

## 2016-09-08 ENCOUNTER — Encounter: Payer: Self-pay | Admitting: Advanced Practice Midwife

## 2016-09-21 ENCOUNTER — Encounter: Payer: Self-pay | Admitting: Advanced Practice Midwife

## 2016-09-21 ENCOUNTER — Ambulatory Visit (INDEPENDENT_AMBULATORY_CARE_PROVIDER_SITE_OTHER): Payer: Medicaid Other | Admitting: Advanced Practice Midwife

## 2016-09-21 VITALS — BP 150/100 | HR 88 | Ht 69.0 in | Wt 132.0 lb

## 2016-09-21 DIAGNOSIS — Z3046 Encounter for surveillance of implantable subdermal contraceptive: Secondary | ICD-10-CM

## 2016-09-21 DIAGNOSIS — Z3049 Encounter for surveillance of other contraceptives: Secondary | ICD-10-CM

## 2016-09-21 DIAGNOSIS — Z3202 Encounter for pregnancy test, result negative: Secondary | ICD-10-CM | POA: Diagnosis not present

## 2016-09-21 DIAGNOSIS — Z30017 Encounter for initial prescription of implantable subdermal contraceptive: Secondary | ICD-10-CM

## 2016-09-21 LAB — POCT URINE PREGNANCY: Preg Test, Ur: NEGATIVE

## 2016-09-21 NOTE — Progress Notes (Signed)
Tina Fletcher 31 y.o. There were no vitals filed for this visit.  Past Medical History:  Diagnosis Date  . Abnormal menstrual periods 02/12/2014  . Herpes simplex without mention of complication   . Hypertension   . Medical history non-contributory   . Migraines   . Nexplanon insertion 08/13/2013   08/13/13 inserted left arm, remove 08/13/16  . Vitamin D deficiency 04/01/2016    Family History  Problem Relation Age of Onset  . Diabetes Maternal Grandmother   . Cancer Paternal Grandfather     prancreatic  . COPD Maternal Grandfather   . Cancer Maternal Grandfather     prostate  . Asthma Maternal Grandfather   . Asthma Son   . Bronchitis Son     Social History   Social History  . Marital status: Single    Spouse name: N/A  . Number of children: N/A  . Years of education: N/A   Occupational History  . Not on file.   Social History Main Topics  . Smoking status: Never Smoker  . Smokeless tobacco: Never Used  . Alcohol use Yes     Comment: occ  . Drug use: No  . Sexual activity: Yes    Birth control/ protection: Implant   Other Topics Concern  . Not on file   Social History Narrative  . No narrative on file    HPI:  Tina Fletcher is here for Nexplanon removal and reinsertion.  She was given informed consent for removal of her Nexplanon and reinsertion of a new one.A signed copy is in the chart.  Appropriate time out taken. Nexlanon site (left arm) identified and thea area was prepped in usual sterile fashon. 2 cc of 1% lidocaine was used to anesthetize the area starting with the distal end of the implant. A small stab incision was made right beside the implant on the distal portion.  The Nexplanon rod was grasped using hemostats and removed without difficulty.  There was less than 3 cc blood loss. There were no complications. Next, the area was cleansed again and the new Nexplanon was inserted without difficulty.  Steri strips and a pressure bandage were  applied.  Pt was instructed to remove pressure bandage in a few hours, and keep insertion site covered with a bandaid for 3 days.  Follow-up scheduled PRN problems  Tina Fletcher,Tina Fletcher 09/21/2016 2:00 PM

## 2017-02-04 ENCOUNTER — Emergency Department (HOSPITAL_COMMUNITY): Payer: Medicaid Other

## 2017-02-04 ENCOUNTER — Emergency Department (HOSPITAL_COMMUNITY)
Admission: EM | Admit: 2017-02-04 | Discharge: 2017-02-04 | Disposition: A | Discharge status: Home / Self Care | Payer: Medicaid Other | Attending: Emergency Medicine | Admitting: Emergency Medicine

## 2017-02-04 ENCOUNTER — Encounter (HOSPITAL_COMMUNITY): Payer: Self-pay | Admitting: Emergency Medicine

## 2017-02-04 DIAGNOSIS — Z79899 Other long term (current) drug therapy: Secondary | ICD-10-CM | POA: Diagnosis not present

## 2017-02-04 DIAGNOSIS — R55 Syncope and collapse: Secondary | ICD-10-CM | POA: Diagnosis present

## 2017-02-04 LAB — COMPREHENSIVE METABOLIC PANEL
ALT: 29 U/L (ref 14–54)
AST: 38 U/L (ref 15–41)
Albumin: 4.5 g/dL (ref 3.5–5.0)
Alkaline Phosphatase: 40 U/L (ref 38–126)
Anion gap: 11 (ref 5–15)
BUN: 12 mg/dL (ref 6–20)
CO2: 21 mmol/L — ABNORMAL LOW (ref 22–32)
Calcium: 9.3 mg/dL (ref 8.9–10.3)
Chloride: 108 mmol/L (ref 101–111)
Creatinine, Ser: 0.76 mg/dL (ref 0.44–1.00)
GFR calc Af Amer: >60 mL/min (ref >60)
GFR calc non Af Amer: >60 mL/min (ref >60)
Glucose, Bld: 89 mg/dL (ref 65–99)
Potassium: 3.6 mmol/L (ref 3.5–5.1)
Sodium: 140 mmol/L (ref 135–145)
Total Bilirubin: 1.1 mg/dL (ref 0.3–1.2)
Total Protein: 7.4 g/dL (ref 6.5–8.1)

## 2017-02-04 LAB — CBC WITH DIFFERENTIAL/PLATELET
Basophils Absolute: 0 10*3/uL (ref 0.0–0.1)
Basophils Relative: 1 %
Eosinophils Absolute: 0 10*3/uL (ref 0.0–0.7)
Eosinophils Relative: 1 %
HCT: 35.6 % — ABNORMAL LOW (ref 36.0–46.0)
Hemoglobin: 12.1 g/dL (ref 12.0–15.0)
Lymphocytes Relative: 25 %
Lymphs Abs: 1 10*3/uL (ref 0.7–4.0)
MCH: 29.3 pg (ref 26.0–34.0)
MCHC: 34 g/dL (ref 30.0–36.0)
MCV: 86.2 fL (ref 78.0–100.0)
Monocytes Absolute: 0.3 10*3/uL (ref 0.1–1.0)
Monocytes Relative: 8 %
Neutro Abs: 2.7 10*3/uL (ref 1.7–7.7)
Neutrophils Relative %: 65 %
Platelets: 208 10*3/uL (ref 150–400)
RBC: 4.13 MIL/uL (ref 3.87–5.11)
RDW: 13.2 % (ref 11.5–15.5)
WBC: 4.1 10*3/uL (ref 4.0–10.5)

## 2017-02-04 LAB — I-STAT BETA HCG BLOOD, ED (MC, WL, AP ONLY): I-stat hCG, quantitative: <5 m[IU]/mL (ref <5)

## 2017-02-04 LAB — CBG MONITORING, ED: Glucose-Capillary: 84 mg/dL (ref 65–99)

## 2017-02-04 LAB — I-STAT TROPONIN, ED: Troponin i, poc: 0 ng/mL (ref 0.00–0.08)

## 2017-02-04 MED ORDER — LISINOPRIL 10 MG PO TABS
20.0000 mg | ORAL_TABLET | Freq: Once | ORAL | Status: AC
  Administered 2017-02-04: 20 mg via ORAL
  Filled 2017-02-04: qty 2

## 2017-02-04 MED ORDER — LISINOPRIL 20 MG PO TABS: 20.0000 mg | ORAL_TABLET | Freq: Every day | ORAL | 0 refills | Status: DC

## 2017-02-04 MED ORDER — SODIUM CHLORIDE 0.9 % IV BOLUS (SEPSIS)
1000.0000 mL | Freq: Once | INTRAVENOUS | Status: AC
  Administered 2017-02-04: 1000 mL via INTRAVENOUS

## 2017-02-04 NOTE — Discharge Instructions (Signed)
Follow up with your md in 1-2 weeks. °

## 2017-02-04 NOTE — ED Notes (Signed)
EKG given to Dr. Zammit 

## 2017-02-04 NOTE — ED Provider Notes (Signed)
[facility]-EMERGENCY DEPT Provider Note   CSN: [id number] Arrival date & time: 02/04/17  1506     History   Chief Complaint Chief Complaint  Patient presents with  . Near Syncope    HPI Tina Fletcher is a 31 y.o. female.  Patient states that she was outside and felt dizzy and passed out. She has not been taking her blood pressure medicine for some time because she ran out. She does not know what she was taking   The history is provided by the patient. No language interpreter was used.  Near Syncope  This is a new problem. The current episode started 6 to 12 hours ago. The problem occurs rarely. The problem has been resolved. Pertinent negatives include no chest pain, no abdominal pain and no headaches. Nothing relieves the symptoms. She has tried nothing for the symptoms.    Past Medical History:  Diagnosis Date  . Abnormal menstrual periods 02/12/2014  . Herpes simplex without mention of complication   . Hypertension   . Medical history non-contributory   . Migraines   . Nexplanon insertion 08/13/2013   08/13/13 inserted left arm, remove 08/13/16  . Vitamin D deficiency 04/01/2016    Patient Active Problem List   Diagnosis Date Noted  . Vitamin D deficiency 04/01/2016  . Nexplanon insertion 08/13/2013  . Thrombocytopenia (HCC) 04/17/2013  . 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 Underweight 01/08/2013    Past Surgical History:  Procedure Laterality Date  . NO PAST SURGERIES      OB History    Gravida Para Term Preterm AB Living   2 2 1     2    SAB TAB Ectopic Multiple Live Births           2       Home Medications    Prior to Admission medications   Medication Sig Start Date End Date Taking? Authorizing Provider  acetaminophen (TYLENOL) 325 MG tablet Take 650 mg by mouth every 6 (six) hours as needed.   Yes [provider]  etonogestrel (NEXPLANON) 68 MG IMPL implant Inject 1 each into the skin once.   Yes [provider]  megestrol (MEGACE)  40 MG tablet Take 40 mg by mouth 2 (two) times daily.   Yes [provider]  traMADol (ULTRAM) 50 MG tablet Take by mouth every 6 (six) hours as needed.   Yes [provider]  lisinopril (PRINIVIL,ZESTRIL) 20 MG tablet Take 1 tablet (20 mg total) by mouth daily. 02/04/17   Bethann Berkshire, MD    Family History Family History  Problem Relation Age of Onset  . Diabetes Maternal Grandmother   . Cancer Paternal Grandfather        prancreatic  . COPD Maternal Grandfather   . Cancer Maternal Grandfather        prostate  . Asthma Maternal Grandfather   . Asthma Son   . Bronchitis Son     Social History Social History  Substance Use Topics  . Smoking status: Never Smoker  . Smokeless tobacco: Never Used  . Alcohol use Yes     Comment: occ     Allergies   Patient has no known allergies.   Review of Systems Review of Systems  Constitutional: Negative for appetite change and fatigue.  HENT: Negative for congestion, ear discharge and sinus pressure.   Eyes: Negative for discharge.  Respiratory: Negative for cough.   Cardiovascular: Positive for near-syncope. Negative for chest pain.  Gastrointestinal: Negative for abdominal pain  and diarrhea.  Genitourinary: Negative for frequency and hematuria.  Musculoskeletal: Negative for back pain.  Skin: Negative for rash.  Neurological: Positive for light-headedness. Negative for seizures and headaches.  Psychiatric/Behavioral: Negative for hallucinations.     Physical Exam Updated Vital Signs BP (!) 163/124 (BP Location: Left Arm)   Pulse (!) 111   Temp 98.1 F (36.7 C) (Oral)   Resp 18   Ht 5\' 9"  (1.753 m)   Wt 59.9 kg (132 lb)   SpO2 97%   BMI 19.49 kg/m   Physical Exam  Constitutional: She is oriented to person, place, and time. She appears well-developed.  HENT:  Head: Normocephalic.  Eyes: Conjunctivae and EOM are normal. No scleral icterus.  Neck: Neck supple. No thyromegaly present.    Cardiovascular: Normal rate and regular rhythm.  Exam reveals no gallop and no friction rub.   No murmur heard. Pulmonary/Chest: No stridor. She has no wheezes. She has no rales. She exhibits no tenderness.  Abdominal: She exhibits no distension. There is no tenderness. There is no rebound.  Musculoskeletal: Normal range of motion. She exhibits no edema.  Lymphadenopathy:    She has no cervical adenopathy.  Neurological: She is oriented to person, place, and time. She exhibits normal muscle tone. Coordination normal.  Skin: No rash noted. No erythema.  Psychiatric: She has a normal mood and affect. Her behavior is normal.     ED Treatments / Results  Labs (all labs ordered are listed, but only abnormal results are displayed) Labs Reviewed  CBC WITH DIFFERENTIAL/PLATELET - Abnormal; Notable for the following:       Result Value   HCT 35.6 (*)    All other components within normal limits  COMPREHENSIVE METABOLIC PANEL - Abnormal; Notable for the following:    CO2 21 (*)    All other components within normal limits  CBG MONITORING, ED  I-STAT BETA HCG BLOOD, ED ([facility], [facility], [facility] ONLY)  I-STAT TROPONIN, ED    EKG  EKG Interpretation None       Radiology Ct Head Wo Contrast  Result Date: 02/04/2017 CLINICAL DATA:  Syncopal episode, states she got too hot and passed out, red area at RIGHT frontal region, says she struck her head on the dirt, history of hypertension and migraines EXAM: CT HEAD WITHOUT CONTRAST TECHNIQUE: Contiguous axial images were obtained from the base of the skull through the vertex without intravenous contrast. Sagittal and coronal MPR images reconstructed from axial data set. COMPARISON:  12/22/2014 FINDINGS: Brain: Beam hardening artifacts from earrings. Normal ventricular morphology. No midline shift or mass effect. Mildly prominent cisterna magna unchanged. No intracranial hemorrhage, mass lesion, or evidence of acute infarction. No extra-axial fluid collections.  Vascular: Unremarkable Skull: Normal appearance. Minimal RIGHT supraorbital scalp hematoma. Sinuses/Orbits: Clear Other: N/A IMPRESSION: No acute intracranial abnormalities. Electronically Signed   By: Ulyses Southward M.D.   On: 02/04/2017 17:49    Procedures Procedures (including critical care time)  Medications Ordered in ED Medications  lisinopril (PRINIVIL,ZESTRIL) tablet 20 mg (not administered)  sodium chloride 0.9 % bolus 1,000 mL (1,000 mLs Intravenous New Bag/Given 02/04/17 1611)     Initial Impression / Assessment and Plan / ED Course  I have reviewed the triage vital signs and the nursing notes.  Pertinent labs & imaging results that were available during my care of the patient were reviewed by me and considered in my medical decision making (see chart for details).     Patient with elevated blood pressure and  syncope. She will be started on lisinopril follow-up with her PCP  Final Clinical Impressions(s) / ED Diagnoses   Final diagnoses:  Syncope and collapse    New Prescriptions New Prescriptions   LISINOPRIL (PRINIVIL,ZESTRIL) 20 MG TABLET    Take 1 tablet (20 mg total) by mouth daily.     Bethann Berkshire, MD 02/04/17 [phone]

## 2017-02-04 NOTE — ED Triage Notes (Signed)
Pt had syncopal episode.  Pt states she just got too hot and passed out.  Pt has red area to rt forehead.  A&O x 4

## 2017-04-22 ENCOUNTER — Other Ambulatory Visit: Payer: Self-pay | Admitting: Adult Health

## 2017-04-25 ENCOUNTER — Emergency Department (HOSPITAL_COMMUNITY)
Admission: EM | Admit: 2017-04-25 | Discharge: 2017-04-25 | Disposition: A | Discharge status: Home / Self Care | Payer: Medicaid Other | Attending: Emergency Medicine | Admitting: Emergency Medicine

## 2017-04-25 ENCOUNTER — Encounter (HOSPITAL_COMMUNITY): Payer: Self-pay | Admitting: *Deleted

## 2017-04-25 DIAGNOSIS — Z5321 Procedure and treatment not carried out due to patient leaving prior to being seen by health care provider: Secondary | ICD-10-CM | POA: Diagnosis not present

## 2017-04-25 DIAGNOSIS — R112 Nausea with vomiting, unspecified: Secondary | ICD-10-CM | POA: Insufficient documentation

## 2017-04-25 DIAGNOSIS — R51 Headache: Secondary | ICD-10-CM | POA: Diagnosis present

## 2017-04-25 NOTE — ED Triage Notes (Addendum)
Pt c/o migraine headache with sound sensitivity x 2 days. Pt denies light sensitivity. Pt also reports she hasn't taken her BP meds in 2 months because "it makes me feel funny". Pt denies blurry vision, dizziness. Pt reports nausea with small amount of vomiting this morning.

## 2017-04-25 NOTE — ED Triage Notes (Signed)
Called x 1 for room placement no answer

## 2017-09-28 NOTE — Congregational Nurse Program (Signed)
Congregational Nurse Program Note  Date of Encounter: 09/28/2017  Past Medical History: Past Medical History:  Diagnosis Date  . Abnormal menstrual periods 02/12/2014  . Herpes simplex without mention of complication   . Hypertension   . Medical history non-contributory   . Migraines   . Nexplanon insertion 08/13/2013   08/13/13 inserted left arm, remove 08/13/16  . 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 Stated she hasn't been taking her meds because she ran out,she eats a lot of salt and drinks a lot of sodas.Reviewed importance of following Low Salt diet, getting proper exercise taking prescribed medication  MDO called and has scheduled appointment for Friday 09-30-17 at 3:15 PM

## 2017-11-20 ENCOUNTER — Emergency Department (HOSPITAL_COMMUNITY)
Admission: EM | Admit: 2017-11-20 | Discharge: 2017-11-20 | Disposition: A | Discharge status: Home / Self Care | Payer: Medicaid Other | Attending: Emergency Medicine | Admitting: Emergency Medicine

## 2017-11-20 ENCOUNTER — Emergency Department (HOSPITAL_COMMUNITY): Payer: Medicaid Other

## 2017-11-20 ENCOUNTER — Other Ambulatory Visit: Payer: Self-pay

## 2017-11-20 ENCOUNTER — Encounter (HOSPITAL_COMMUNITY): Payer: Self-pay | Admitting: Emergency Medicine

## 2017-11-20 DIAGNOSIS — I10 Essential (primary) hypertension: Secondary | ICD-10-CM | POA: Insufficient documentation

## 2017-11-20 DIAGNOSIS — Z79899 Other long term (current) drug therapy: Secondary | ICD-10-CM | POA: Diagnosis not present

## 2017-11-20 DIAGNOSIS — J3489 Other specified disorders of nose and nasal sinuses: Secondary | ICD-10-CM | POA: Insufficient documentation

## 2017-11-20 DIAGNOSIS — J029 Acute pharyngitis, unspecified: Secondary | ICD-10-CM | POA: Diagnosis present

## 2017-11-20 DIAGNOSIS — R079 Chest pain, unspecified: Secondary | ICD-10-CM | POA: Insufficient documentation

## 2017-11-20 LAB — I-STAT TROPONIN, ED: Troponin i, poc: 0 ng/mL (ref 0.00–0.08)

## 2017-11-20 LAB — I-STAT CHEM 8, ED
BUN: 12 mg/dL (ref 6–20)
Calcium, Ion: 1.22 mmol/L (ref 1.15–1.40)
Chloride: 102 mmol/L (ref 101–111)
Creatinine, Ser: 0.8 mg/dL (ref 0.44–1.00)
Glucose, Bld: 116 mg/dL — ABNORMAL HIGH (ref 65–99)
HCT: 40 % (ref 36.0–46.0)
Hemoglobin: 13.6 g/dL (ref 12.0–15.0)
Potassium: 3.5 mmol/L (ref 3.5–5.1)
Sodium: 135 mmol/L (ref 135–145)
TCO2: 21 mmol/L — ABNORMAL LOW (ref 22–32)

## 2017-11-20 LAB — I-STAT BETA HCG BLOOD, ED (MC, WL, AP ONLY): I-stat hCG, quantitative: <5 m[IU]/mL (ref <5)

## 2017-11-20 LAB — GROUP A STREP BY PCR: Group A Strep by PCR: NOT DETECTED

## 2017-11-20 MED ORDER — MAGIC MOUTHWASH W/LIDOCAINE: 10.0000 mL | Freq: Four times a day (QID) | ORAL | 0 refills | Status: DC | PRN

## 2017-11-20 NOTE — ED Notes (Signed)
EDP at bedside  

## 2017-11-20 NOTE — ED Provider Notes (Signed)
[facility] EMERGENCY DEPARTMENT Provider Note   CSN: [id number] Arrival date & time: 11/20/17  0800     History   Chief Complaint Chief Complaint  Patient presents with  . Sore Throat    HPI Tina Fletcher is a 32 y.o. female with a history of hypertension and migraines presenting with a 2-day history of sore throat and chest pain.  She describes a moderate sore throat that started gradually 2 days ago.  She also has noted chest pressure which is upper anterior in location which she feels only when supine and only when lying on either side for the past 2 nights.  She also endorses palpitations when lying in this position.  When she is sitting standing or ambulating she does not have palpitations or chest pain.  She denies significant coughing, shortness of breath and has had no documented fevers denies chills.  She has had some nasal congestion with clear rhinorrhea.  She has had no nausea or vomiting, diaphoresis or abdominal pain.  She has had no treatment  prior to arrival.  She reports her daughter currently has a URI, no other known exposures.  The history is provided by the patient.    Past Medical History:  Diagnosis Date  . Abnormal menstrual periods 02/12/2014  . Herpes simplex without mention of complication   . Hypertension   . Medical history non-contributory   . Migraines   . Nexplanon insertion 08/13/2013   08/13/13 inserted left arm, remove 08/13/16  . Vitamin D deficiency 04/01/2016    Patient Active Problem List   Diagnosis Date Noted  . Vitamin D deficiency 04/01/2016  . Nexplanon insertion 08/13/2013  . Thrombocytopenia (HCC) 04/17/2013  . 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 Underweight 01/08/2013    Past Surgical History:  Procedure Laterality Date  . NO PAST SURGERIES       OB History    Gravida  2   Para  2   Term  1   Preterm      AB      Living  2     SAB      TAB      Ectopic      Multiple      Live Births  2             Home Medications    Prior to Admission medications   Medication Sig Start Date End Date Taking? Authorizing Provider  acetaminophen (TYLENOL) 325 MG tablet Take 650 mg by mouth every 6 (six) hours as needed.    [provider]  etonogestrel (NEXPLANON) 68 MG IMPL implant Inject 1 each into the skin once.    [provider]  lisinopril (PRINIVIL,ZESTRIL) 20 MG tablet Take 1 tablet (20 mg total) by mouth daily. 02/04/17   Bethann Berkshire, MD  magic mouthwash w/lidocaine SOLN Take 10 mLs by mouth 4 (four) times daily as needed (throat pain). Gargle and spit for throat pain relief   Note to pharmacy - equal parts diphendydramine, aluminum hydroxide and lidocaine HCL 11/20/17   Manuela Halbur, Raynelle Fanning, PA-C  megestrol (MEGACE) 40 MG tablet TAKE (1) TABLET BY MOUTH TWICE DAILY. 04/25/17   Adline Potter, NP  traMADol (ULTRAM) 50 MG tablet Take by mouth every 6 (six) hours as needed.    [provider]    Family History Family History  Problem Relation Age of Onset  . Diabetes Maternal Grandmother   . Cancer Paternal Grandfather  prancreatic  . COPD Maternal Grandfather   . Cancer Maternal Grandfather        prostate  . Asthma Maternal Grandfather   . Asthma Son   . Bronchitis Son     Social History Social History   Tobacco Use  . Smoking status: Never Smoker  . Smokeless tobacco: Never Used  Substance Use Topics  . Alcohol use: Yes    Comment: occ  . Drug use: No     Allergies   Patient has no known allergies.   Review of Systems Review of Systems  Constitutional: Positive for chills and fever.  HENT: Positive for congestion, rhinorrhea and sore throat. Negative for ear pain, sinus pressure, trouble swallowing and voice change.   Eyes: Negative for discharge.  Respiratory: Positive for cough. Negative for shortness of breath, wheezing and stridor.   Cardiovascular: Positive for chest pain and palpitations.  Gastrointestinal: Negative for  abdominal pain.  Genitourinary: Negative.      Physical Exam Updated Vital Signs BP (!) 122/92 (BP Location: Right Arm)   Pulse 94   Temp 98.3 F (36.8 C) (Oral)   Resp 16   Ht 5\' 8"  (1.727 m)   Wt 59.9 kg (132 lb)   SpO2 100%   BMI 20.07 kg/m   Physical Exam  Constitutional: She is oriented to person, place, and time. She appears well-developed and well-nourished.  HENT:  Head: Normocephalic and atraumatic.  Right Ear: Tympanic membrane and ear canal normal.  Left Ear: Tympanic membrane and ear canal normal.  Nose: Mucosal edema and rhinorrhea present.  Mouth/Throat: Uvula is midline and mucous membranes are normal. Posterior oropharyngeal erythema present. No oropharyngeal exudate, posterior oropharyngeal edema or tonsillar abscesses. No tonsillar exudate.  Eyes: Conjunctivae are normal.  Cardiovascular: Normal rate and normal heart sounds.  Pulmonary/Chest: Effort normal and breath sounds normal. No accessory muscle usage. No respiratory distress. She has no wheezes. She has no rhonchi. She has no rales.  No reproducible chest pain with palpation  Abdominal: Soft. There is no tenderness.  Musculoskeletal: Normal range of motion.  Neurological: She is alert and oriented to person, place, and time.  Skin: Skin is warm and dry. No rash noted.  Psychiatric: She has a normal mood and affect.     ED Treatments / Results  Labs (all labs ordered are listed, but only abnormal results are displayed) Labs Reviewed  I-STAT CHEM 8, ED - Abnormal; Notable for the following components:      Result Value   Glucose, Bld 116 (*)    TCO2 21 (*)    All other components within normal limits  GROUP A STREP BY PCR  I-STAT TROPONIN, ED  I-STAT BETA HCG BLOOD, ED ([facility], [facility], [facility] ONLY)    EKG EKG Interpretation  Date/Time:  Sunday Nov 20 2017 09:26:15 EDT Ventricular Rate:  84 PR Interval:    QRS Duration: 99 QT Interval:  433 QTC Calculation: 512 R Axis:   68 Text Interpretation:   Sinus rhythm Probable left ventricular hypertrophy Prolonged QT interval Since last tracing rate slower QT has lengthened Confirmed by Eber Hong ([id number]) on 11/20/2017 10:06:50 AM   Radiology Dg Chest 2 View  Result Date: 11/20/2017 CLINICAL DATA:  Chest pressure for 2 days. EXAM: CHEST - 2 VIEW COMPARISON:  02/07/2011 FINDINGS: Cardiomediastinal silhouette is normal. Mediastinal contours appear intact. There is no evidence of focal airspace consolidation, pleural effusion or pneumothorax. Osseous structures are without acute abnormality. Soft tissues are grossly normal. IMPRESSION: No  active cardiopulmonary disease. Electronically Signed   By: Ted Mcalpine M.D.   On: 11/20/2017 10:08    Procedures Procedures (including critical care time)  Medications Ordered in ED Medications - No data to display   Initial Impression / Assessment and Plan / ED Course  I have reviewed the triage vital signs and the nursing notes.  Pertinent labs & imaging results that were available during my care of the patient were reviewed by me and considered in my medical decision making (see chart for details).     Labs, imaging, ekg unremarkable.  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 No findings to suggest CAD, CHF, no arrhymia's per ekg or monitor while here.   Advsied f/u with pcp for a recheck of her sx this week.  Magic mouthwash prescribed for sx relief of viral pharyngitis.    Final Clinical Impressions(s) / ED Diagnoses   Final diagnoses:  Viral pharyngitis    ED Discharge Orders        Ordered    magic mouthwash w/lidocaine SOLN  4 times daily PRN     11/20/17 1016       Burgess Amor, PA-C 11/20/17 1019    Eber Hong, MD 11/20/17 [id number]

## 2017-11-20 NOTE — Discharge Instructions (Addendum)
Your labs, ekg and chest xray are stable today.  I suspect you may have a viral infection that should run its course.  Rest and make sure you are drinking plenty of fluids. You may use motrin or tylenol for pain relief.  The prescription will also help with throat pain.

## 2017-11-20 NOTE — ED Triage Notes (Signed)
Pt reports sore throat x 2 days.  States temp of 100 yesterday with no meds taken.

## 2017-12-21 ENCOUNTER — Other Ambulatory Visit: Payer: Self-pay | Admitting: Adult Health

## 2018-02-23 ENCOUNTER — Ambulatory Visit: Payer: Medicaid Other | Admitting: Women's Health

## 2018-03-10 ENCOUNTER — Encounter: Payer: Self-pay | Admitting: *Deleted

## 2018-03-10 ENCOUNTER — Ambulatory Visit: Payer: Medicaid Other | Admitting: Women's Health

## 2019-05-10 ENCOUNTER — Inpatient Hospital Stay (HOSPITAL_COMMUNITY)
Admission: EM | Admit: 2019-05-10 | Discharge: 2019-05-14 | DRG: 286 | Disposition: A | Discharge status: Home / Self Care | Payer: Medicaid Other | Attending: Cardiology | Admitting: Cardiology

## 2019-05-10 ENCOUNTER — Emergency Department (HOSPITAL_COMMUNITY): Payer: Medicaid Other

## 2019-05-10 ENCOUNTER — Other Ambulatory Visit: Payer: Self-pay

## 2019-05-10 ENCOUNTER — Encounter (HOSPITAL_COMMUNITY): Payer: Self-pay

## 2019-05-10 DIAGNOSIS — I361 Nonrheumatic tricuspid (valve) insufficiency: Secondary | ICD-10-CM

## 2019-05-10 DIAGNOSIS — R509 Fever, unspecified: Secondary | ICD-10-CM | POA: Diagnosis present

## 2019-05-10 DIAGNOSIS — I340 Nonrheumatic mitral (valve) insufficiency: Secondary | ICD-10-CM | POA: Diagnosis not present

## 2019-05-10 DIAGNOSIS — E559 Vitamin D deficiency, unspecified: Secondary | ICD-10-CM | POA: Diagnosis present

## 2019-05-10 DIAGNOSIS — E876 Hypokalemia: Secondary | ICD-10-CM | POA: Diagnosis present

## 2019-05-10 DIAGNOSIS — I10 Essential (primary) hypertension: Secondary | ICD-10-CM | POA: Diagnosis not present

## 2019-05-10 DIAGNOSIS — Z20828 Contact with and (suspected) exposure to other viral communicable diseases: Secondary | ICD-10-CM | POA: Diagnosis present

## 2019-05-10 DIAGNOSIS — I081 Rheumatic disorders of both mitral and tricuspid valves: Secondary | ICD-10-CM | POA: Diagnosis present

## 2019-05-10 DIAGNOSIS — F1010 Alcohol abuse, uncomplicated: Secondary | ICD-10-CM | POA: Diagnosis present

## 2019-05-10 DIAGNOSIS — G43909 Migraine, unspecified, not intractable, without status migrainosus: Secondary | ICD-10-CM | POA: Diagnosis present

## 2019-05-10 DIAGNOSIS — B349 Viral infection, unspecified: Secondary | ICD-10-CM | POA: Diagnosis present

## 2019-05-10 DIAGNOSIS — I429 Cardiomyopathy, unspecified: Secondary | ICD-10-CM | POA: Diagnosis present

## 2019-05-10 DIAGNOSIS — I110 Hypertensive heart disease with heart failure: Secondary | ICD-10-CM | POA: Diagnosis present

## 2019-05-10 DIAGNOSIS — I400 Infective myocarditis: Secondary | ICD-10-CM | POA: Diagnosis present

## 2019-05-10 DIAGNOSIS — Z9114 Patient's other noncompliance with medication regimen: Secondary | ICD-10-CM | POA: Diagnosis not present

## 2019-05-10 DIAGNOSIS — Z833 Family history of diabetes mellitus: Secondary | ICD-10-CM | POA: Diagnosis not present

## 2019-05-10 DIAGNOSIS — Z8249 Family history of ischemic heart disease and other diseases of the circulatory system: Secondary | ICD-10-CM | POA: Diagnosis not present

## 2019-05-10 DIAGNOSIS — I5021 Acute systolic (congestive) heart failure: Secondary | ICD-10-CM | POA: Diagnosis present

## 2019-05-10 DIAGNOSIS — I959 Hypotension, unspecified: Secondary | ICD-10-CM | POA: Diagnosis present

## 2019-05-10 DIAGNOSIS — Z809 Family history of malignant neoplasm, unspecified: Secondary | ICD-10-CM | POA: Diagnosis not present

## 2019-05-10 DIAGNOSIS — Z825 Family history of asthma and other chronic lower respiratory diseases: Secondary | ICD-10-CM

## 2019-05-10 LAB — URINALYSIS, ROUTINE W REFLEX MICROSCOPIC
Bacteria, UA: NONE SEEN
Bilirubin Urine: NEGATIVE
Glucose, UA: NEGATIVE mg/dL
Hgb urine dipstick: NEGATIVE
Ketones, ur: NEGATIVE mg/dL
Leukocytes,Ua: NEGATIVE
Nitrite: NEGATIVE
Protein, ur: 100 mg/dL — AB
Specific Gravity, Urine: 1.027 (ref 1.005–1.030)
pH: 5 (ref 5.0–8.0)

## 2019-05-10 LAB — BASIC METABOLIC PANEL
Anion gap: 13 (ref 5–15)
BUN: 17 mg/dL (ref 6–20)
CO2: 22 mmol/L (ref 22–32)
Calcium: 8.8 mg/dL — ABNORMAL LOW (ref 8.9–10.3)
Chloride: 106 mmol/L (ref 98–111)
Creatinine, Ser: 0.66 mg/dL (ref 0.44–1.00)
GFR calc Af Amer: >60 mL/min (ref >60)
GFR calc non Af Amer: >60 mL/min (ref >60)
Glucose, Bld: 102 mg/dL — ABNORMAL HIGH (ref 70–99)
Potassium: 3.2 mmol/L — ABNORMAL LOW (ref 3.5–5.1)
Sodium: 141 mmol/L (ref 135–145)

## 2019-05-10 LAB — CBC WITH DIFFERENTIAL/PLATELET
Abs Immature Granulocytes: 0.01 10*3/uL (ref 0.00–0.07)
Basophils Absolute: 0 10*3/uL (ref 0.0–0.1)
Basophils Relative: 1 %
Eosinophils Absolute: 0.1 10*3/uL (ref 0.0–0.5)
Eosinophils Relative: 1 %
HCT: 37.8 % (ref 36.0–46.0)
Hemoglobin: 12.3 g/dL (ref 12.0–15.0)
Immature Granulocytes: 0 %
Lymphocytes Relative: 23 %
Lymphs Abs: 1.3 10*3/uL (ref 0.7–4.0)
MCH: 29 pg (ref 26.0–34.0)
MCHC: 32.5 g/dL (ref 30.0–36.0)
MCV: 89.2 fL (ref 80.0–100.0)
Monocytes Absolute: 0.3 10*3/uL (ref 0.1–1.0)
Monocytes Relative: 5 %
Neutro Abs: 4.2 10*3/uL (ref 1.7–7.7)
Neutrophils Relative %: 70 %
Platelets: 221 10*3/uL (ref 150–400)
RBC: 4.24 MIL/uL (ref 3.87–5.11)
RDW: 14.5 % (ref 11.5–15.5)
WBC: 5.9 10*3/uL (ref 4.0–10.5)
nRBC: 0 % (ref 0.0–0.2)

## 2019-05-10 LAB — BRAIN NATRIURETIC PEPTIDE: B Natriuretic Peptide: 810 pg/mL — ABNORMAL HIGH (ref 0.0–100.0)

## 2019-05-10 LAB — D-DIMER, QUANTITATIVE (NOT AT ARMC): D-Dimer, Quant: 1.46 ug/mL-FEU — ABNORMAL HIGH (ref 0.00–0.50)

## 2019-05-10 LAB — TROPONIN I (HIGH SENSITIVITY)
Troponin I (High Sensitivity): 13 ng/L (ref <18)
Troponin I (High Sensitivity): 13 ng/L (ref <18)

## 2019-05-10 LAB — POC URINE PREG, ED: Preg Test, Ur: NEGATIVE

## 2019-05-10 LAB — SARS CORONAVIRUS 2 (TAT 6-24 HRS): SARS Coronavirus 2: NEGATIVE

## 2019-05-10 LAB — TSH: TSH: 0.906 u[IU]/mL (ref 0.350–4.500)

## 2019-05-10 LAB — MAGNESIUM: Magnesium: 1.6 mg/dL — ABNORMAL LOW (ref 1.7–2.4)

## 2019-05-10 MED ORDER — ZOLPIDEM TARTRATE 5 MG PO TABS
5.0000 mg | ORAL_TABLET | Freq: Every evening | ORAL | Status: DC | PRN
  Administered 2019-05-10: 5 mg via ORAL
  Filled 2019-05-10: qty 1

## 2019-05-10 MED ORDER — IOHEXOL 350 MG/ML SOLN
100.0000 mL | Freq: Once | INTRAVENOUS | Status: AC | PRN
  Administered 2019-05-10: 100 mL via INTRAVENOUS

## 2019-05-10 MED ORDER — BENZONATATE 100 MG PO CAPS
200.0000 mg | ORAL_CAPSULE | Freq: Once | ORAL | Status: AC
  Administered 2019-05-10: 200 mg via ORAL
  Filled 2019-05-10: qty 2

## 2019-05-10 MED ORDER — SODIUM CHLORIDE 0.9 % IV BOLUS
1000.0000 mL | Freq: Once | INTRAVENOUS | Status: AC
  Administered 2019-05-10: 1000 mL via INTRAVENOUS

## 2019-05-10 MED ORDER — ENOXAPARIN SODIUM 40 MG/0.4ML ~~LOC~~ SOLN
40.0000 mg | SUBCUTANEOUS | Status: DC
  Administered 2019-05-10 – 2019-05-13 (×4): 40 mg via SUBCUTANEOUS
  Filled 2019-05-10 (×4): qty 0.4

## 2019-05-10 MED ORDER — POTASSIUM CHLORIDE CRYS ER 20 MEQ PO TBCR
40.0000 meq | EXTENDED_RELEASE_TABLET | ORAL | Status: AC
  Administered 2019-05-10 (×2): 40 meq via ORAL
  Filled 2019-05-10 (×2): qty 2

## 2019-05-10 MED ORDER — POLYETHYLENE GLYCOL 3350 17 G PO PACK: 17.0000 g | PACK | Freq: Every day | ORAL | Status: DC | PRN

## 2019-05-10 MED ORDER — FUROSEMIDE 10 MG/ML IJ SOLN
40.0000 mg | Freq: Two times a day (BID) | INTRAMUSCULAR | Status: DC
  Administered 2019-05-10 – 2019-05-13 (×5): 40 mg via INTRAVENOUS
  Filled 2019-05-10 (×5): qty 4

## 2019-05-10 MED ORDER — ACETAMINOPHEN 650 MG RE SUPP: 650.0000 mg | Freq: Four times a day (QID) | RECTAL | Status: DC | PRN

## 2019-05-10 MED ORDER — METOPROLOL TARTRATE 50 MG PO TABS
50.0000 mg | ORAL_TABLET | Freq: Once | ORAL | Status: AC
  Administered 2019-05-10: 50 mg via ORAL
  Filled 2019-05-10: qty 1

## 2019-05-10 MED ORDER — ACETAMINOPHEN 325 MG PO TABS
650.0000 mg | ORAL_TABLET | Freq: Once | ORAL | Status: AC
  Administered 2019-05-10: 650 mg via ORAL
  Filled 2019-05-10: qty 2

## 2019-05-10 MED ORDER — MAGNESIUM SULFATE 2 GM/50ML IV SOLN
2.0000 g | Freq: Once | INTRAVENOUS | Status: AC
  Administered 2019-05-10: 2 g via INTRAVENOUS
  Filled 2019-05-10: qty 50

## 2019-05-10 MED ORDER — ACETAMINOPHEN 325 MG PO TABS
650.0000 mg | ORAL_TABLET | Freq: Four times a day (QID) | ORAL | Status: DC | PRN
  Administered 2019-05-13: 650 mg via ORAL
  Filled 2019-05-10: qty 2

## 2019-05-10 NOTE — Progress Notes (Signed)
*  PRELIMINARY RESULTS* Echocardiogram 2D Echocardiogram has been performed.  Tina Fletcher 05/10/2019, 11:47 AM

## 2019-05-10 NOTE — H&P (Addendum)
History and Physical    Tina Fletcher RXV:[id number] DOB: 1986-03-25 DOA: 05/10/2019  PCP: Mikey Kirschner, MD   Patient coming from: Home  I have personally briefly reviewed patient's old medical records in [place]  Chief Complaint:Cough, SOB  HPI: Tina Fletcher is a 33 y.o. female with medical history significant for hypertension, presented to the ED with complaints of cough of 3 months duration.  She also reports fever and sneezing over the past 3 days.  Her son had a recent URI also his symptoms have resolved.  Patient said she and her son tested negative for Covid about 3 weeks ago. She also reports difficulty breathing with exertion over the past several weeks.  Ankle swelling about a week ago which resolved.  No chest pain.  Reports her normal weight is about 117 pounds but now she is weighing up to 131 pounds.  Reports she has never weighed that much before.  She denies known Covid positive contacts. She reports heart disease in her maternal uncle-heart attack, and recent heart surgery in her maternal grandmother.  ED Course: Initial tachycardia to 123, tachypneic to 30. Blood pressure systolic 509T to 267T.  O2 sats greater than 93% on room air.  Hs troponin x2 13 . 13.  EKG with nonspecific T wave abnormalities.  D-dimer elevated at 1.46.  Portable chest x-ray-bibasilar airspace disease worrisome for pneumonia, enlarged cardiac silhouette worrisome for pericardial effusion.  Subsequent CTA chest-negative for PE, showed cardiac enlargement bilateral pleural effusions and pulmonary edema, concerning for CHF.  50 mg p.o. metoprolol given.  With initial concern for infectious etiology-pneumonia and sepsis, 2 L bolus normal saline was given.  Echo was obtained in the ED showed EF of 15% EDP talked to cardiology on call patient to be seen in the morning can be admitted here, will determine need for transfer.  Review of Systems: As per HPI all other systems reviewed and  negative.  Past Medical History:  Diagnosis Date   Abnormal menstrual periods 02/12/2014   Herpes simplex without mention of complication    Hypertension    Medical history non-contributory    Migraines    Nexplanon insertion 08/13/2013   08/13/13 inserted left arm, remove 08/13/16   Vitamin D deficiency 04/01/2016    Past Surgical History:  Procedure Laterality Date   NO PAST SURGERIES       reports that she has never smoked. She has never used smokeless tobacco. She reports current alcohol use. She reports that she does not use drugs.  No Known Allergies  Family History  Problem Relation Age of Onset   Diabetes Maternal Grandmother    Cancer Paternal Grandfather        prancreatic   COPD Maternal Grandfather    Cancer Maternal Grandfather        prostate   Asthma Maternal Grandfather    Asthma Son    Bronchitis Son     Prior to Admission medications   Medication Sig Start Date End Date Taking? Authorizing Provider  acetaminophen (TYLENOL) 325 MG tablet Take 650 mg by mouth every 6 (six) hours as needed.   Yes [provider]  lisinopril (PRINIVIL,ZESTRIL) 20 MG tablet Take 1 tablet (20 mg total) by mouth daily. 02/04/17  Yes Milton Ferguson, MD  etonogestrel (NEXPLANON) 68 MG IMPL implant Inject 1 each into the skin once.    [provider]  magic mouthwash w/lidocaine SOLN Take 10 mLs by mouth 4 (four) times daily as needed (  throat pain). Gargle and spit for throat pain relief   Note to pharmacy - equal parts diphendydramine, aluminum hydroxide and lidocaine HCL Patient not taking: Reported on 05/10/2019 11/20/17   Burgess Amor, PA-C  megestrol (MEGACE) 40 MG tablet TAKE (1) TABLET BY MOUTH TWICE DAILY. Patient not taking: Reported on 05/10/2019 04/25/17   Adline Potter, NP  traMADol (ULTRAM) 50 MG tablet Take by mouth every 6 (six) hours as needed.    [provider]    Physical Exam: Vitals:   05/10/19 1545 05/10/19 1600  05/10/19 1615 05/10/19 1630  BP:  (!) 165/126  (!) 144/117  Pulse: (!) 115 (!) 107 98 92  Resp: 19 (!) 30 (!) 30 (!) 27  Temp:      TempSrc:      SpO2: 97% 97% 99% 97%  Weight:      Height:        Constitutional: NAD, calm, comfortable Vitals:   05/10/19 1545 05/10/19 1600 05/10/19 1615 05/10/19 1630  BP:  (!) 165/126  (!) 144/117  Pulse: (!) 115 (!) 107 98 92  Resp: 19 (!) 30 (!) 30 (!) 27  Temp:      TempSrc:      SpO2: 97% 97% 99% 97%  Weight:      Height:       Eyes: PERRL, lids and conjunctivae normal ENMT: Mucous membranes are moist. Posterior pharynx clear of any exudate or lesions. Neck: normal, supple, no masses, no thyromegaly Respiratory: Faint bibasilar crackles,  no wheezing, Normal respiratory effort. No accessory muscle use.  Cardiovascular: Tachycardic, regular rate and rhythm, no murmurs / rubs / gallops. No extremity edema. 2+ pedal pulses.   Abdomen: no tenderness, no masses palpated. No hepatosplenomegaly. Bowel sounds positive.  Musculoskeletal: no clubbing / cyanosis. No joint deformity upper and lower extremities. Good ROM, no contractures. Normal muscle tone.  Skin: no rashes, lesions, ulcers. No induration Neurologic: CN 2-12 grossly intact.  Strength 5/5 in all 4.  Psychiatric: Normal judgment and insight. Alert and oriented x 3. Normal mood.   Labs on Admission: I have personally reviewed following labs and imaging studies  CBC: Recent Labs  Lab 05/10/19 1002  WBC 5.9  NEUTROABS 4.2  HGB 12.3  HCT 37.8  MCV 89.2  PLT 221   Basic Metabolic Panel: Recent Labs  Lab 05/10/19 1002 05/10/19 1007  NA 141  --   K 3.2*  --   CL 106  --   CO2 22  --   GLUCOSE 102*  --   BUN 17  --   CREATININE 0.66  --   CALCIUM 8.8*  --   MG  --  1.6*   Thyroid Function Tests: Recent Labs    05/10/19 1007  TSH 0.906   Anemia Panel: No results for input(s): VITAMINB12, FOLATE, FERRITIN, TIBC, IRON, RETICCTPCT in the last 72 hours. Urine  analysis:    Component Value Date/Time   COLORURINE YELLOW 05/10/2019 0930   APPEARANCEUR HAZY (A) 05/10/2019 0930   LABSPEC 1.027 05/10/2019 0930   PHURINE 5.0 05/10/2019 0930   GLUCOSEU NEGATIVE 05/10/2019 0930   HGBUR NEGATIVE 05/10/2019 0930   BILIRUBINUR NEGATIVE 05/10/2019 0930   KETONESUR NEGATIVE 05/10/2019 0930   PROTEINUR 100 (A) 05/10/2019 0930   UROBILINOGEN 0.2 01/08/2013 1138   NITRITE NEGATIVE 05/10/2019 0930   LEUKOCYTESUR NEGATIVE 05/10/2019 0930    Radiological Exams on Admission: Ct Angio Chest Pe W And/or Wo Contrast  Result Date: 05/10/2019 CLINICAL DATA:  Evaluate  for acute pulmonary embolus. High pretest probability. EXAM: CT ANGIOGRAPHY CHEST WITH CONTRAST TECHNIQUE: Multidetector CT imaging of the chest was performed using the standard protocol during bolus administration of intravenous contrast. Multiplanar CT image reconstructions and MIPs were obtained to evaluate the vascular anatomy. CONTRAST:  OMNIPAQUE IOHEXOL 350 MG/ML SOLN COMPARISON:  None. FINDINGS: Cardiovascular: Satisfactory opacification of the pulmonary arteries to the segmental level. No evidence of pulmonary embolism. The heart size is enlarged. No pericardial effusion. Mediastinum/Nodes: No enlarged mediastinal, hilar, or axillary lymph nodes. Thyroid gland, trachea, and esophagus demonstrate no significant findings. Lungs/Pleura: There are bilateral pleural effusions, right greater than left. Bilateral upper and lower lung zone interlobular septal thickening is identified. Ground-glass attenuation is noted in both lungs with a lower lung zone predominance. Upper Abdomen: No acute abnormality. Musculoskeletal: No chest wall abnormality. No acute or significant osseous findings. Review of the MIP images confirms the above findings. IMPRESSION: 1. No evidence for acute pulmonary embolus. 2. Cardiac enlargement, bilateral pleural effusions and pulmonary edema. Imaging findings are concerning for  congestive heart failure. Electronically Signed   By: Signa Kell M.D.   On: 05/10/2019 13:36   Dg Chest Portable 1 View  Result Date: 05/10/2019 CLINICAL DATA:  Cough for 3 months.  Fever and sneezing for 3 days. EXAM: PORTABLE CHEST 1 VIEW COMPARISON:  None. FINDINGS: The patient has bibasilar airspace disease. The cardiopericardial silhouette is enlarged. No pneumothorax or pleural effusion. No acute or focal bony abnormality. IMPRESSION: Bibasilar airspace disease worrisome for pneumonia. Enlarged cardiopericardial silhouette is new since the prior examination and worrisome for pericardial effusion. Electronically Signed   By: Drusilla Kanner M.D.   On: 05/10/2019 10:03    EKG: Independently reviewed.  Sinus tachycardia rate 119, nonspecific T wave abnormalities in 2 3 aVF V3 and V4, when compared to prior EKG.  Prolonged QTC 638.  Assessment/Plan Active Problems:   Systolic CHF, acute (HCC)   Newly diagnosed acute systolic CHF- dyspnea on exertion, cough 3 months.  CTA chest negative for PE shows bilateral pleural effusions and pulmonary edema with cardiomegaly.  130 reports baseline weight of 117 lbs.  Echo obtained in ED, at 15%.  Urine pregnancy test negative, TSH normal, Hs Trop x 2 - 13 . ? etiology of CHF ? Viral. - EDP talked to cardiology, patient will be seen in a.m. -IV Lasix 40 every 12 hourly -Strict input output -Daily weight -BMP a.m -Check BNP -Follow-up COVID-19 test - HgbA1c -Hold beta-blockers for now in the setting of acute CHF  Prolonged QTC-638.  Hypokalemia 3.2, mild hypomagnesemia 1.6.  - Repeat EKG in a.m  Electrolyte abnormality- Hypokalemia- 3.2, hypomagnesemia- 1.6. -Replete  Hypertension-stable. -Hold Home medications lisinopril 20 mg daily, with contrast exposure and with diuresis  DVT prophylaxis: Lovenox Code Status: Full code Family Communication: None at bedside Disposition Plan: Per rounding team Consults called: Cardiology Admission  status: Inpatient, telemetry I certify that at the point of admission it is my clinical judgment that the patient will require inpatient hospital care spanning beyond 2 midnights from the point of admission due to high intensity of service, high risk for further deterioration and high frequency of surveillance required. The following factors support the patient status of inpatient: Newly diagnosed CHF requiring inpatient diuresis.    Onnie Boer MD [facility]  05/10/2019, 5:57 PM

## 2019-05-10 NOTE — ED Notes (Signed)
Patient transported to CT 

## 2019-05-10 NOTE — ED Triage Notes (Signed)
Pt reports cough x 3 months but reports son recently had URI and she has had fever and sneezing for 3 days.  Reports was tested for covid 3 weeks ago and was negative.

## 2019-05-10 NOTE — ED Notes (Signed)
Echo in process 

## 2019-05-10 NOTE — ED Provider Notes (Signed)
[facility] EMERGENCY DEPARTMENT Provider Note   CSN: [id number] Arrival date & time: 05/10/19  0854     History   Chief Complaint Chief Complaint  Patient presents with   Cough   Fever    HPI Tina Fletcher is a 33 y.o. female.     HPI   Tina Fletcher is a 33 y.o. female who presents to the Emergency Department complaining of a persistent, nonproductive dry cough.  Her cough has been present for 3 months.  She states that she has also developed a low-grade fever, sneezing, and rhinorrhea for 3 days with Increasing fatigue.  She states the symptoms have made her cough worse.  Her son had similar symptoms, but symptoms have resolved.  She states she and her son were Covid tested 3 weeks ago and were both negative.  She denies any new or known exposures.  She states her cough is exacerbated with exertion and with speaking.  She does admit to taking lisinopril for her hypertension.  She denies chest pain, shortness of breath, abdominal pain, vomiting or diarrhea, dysuria and fever this morning.    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 Authorizing Provider  acetaminophen (TYLENOL) 325 MG tablet Take 650 mg by mouth every 6 (six) hours as needed.    [provider]  etonogestrel (NEXPLANON) 68 MG IMPL implant Inject 1 each into the skin once.    [provider]  lisinopril (PRINIVIL,ZESTRIL) 20 MG tablet Take 1 tablet (20 mg total) by mouth daily. 02/04/17   Bethann Berkshire, MD  magic mouthwash w/lidocaine SOLN Take 10 mLs by mouth 4 (four) times daily as needed (throat pain). Gargle and spit for throat pain relief   Note to pharmacy - equal parts diphendydramine, aluminum hydroxide and lidocaine HCL 11/20/17   Idol, Raynelle Fanning, PA-C  megestrol (MEGACE) 40 MG tablet TAKE (1) TABLET BY MOUTH TWICE DAILY. 04/25/17   Adline Potter, NP  traMADol (ULTRAM) 50 MG tablet Take by mouth every 6 (six) hours as needed.    [provider]    Family History Family History  Problem Relation Age of Onset   Diabetes Maternal Grandmother    Cancer Paternal Grandfather        prancreatic   COPD Maternal Grandfather    Cancer Maternal Grandfather  prostate   Asthma Maternal Grandfather    Asthma Son    Bronchitis Son     Social History Social History   Tobacco Use   Smoking status: Never Smoker   Smokeless tobacco: Never Used  Substance Use Topics   Alcohol use: Yes    Comment: occ   Drug use: No     Allergies   Patient has no known allergies.   Review of Systems Review of Systems  Constitutional: Positive for fever. Negative for appetite change and chills.  HENT: Positive for congestion, rhinorrhea and sneezing. Negative for sore throat and trouble swallowing.   Respiratory: Positive for cough. Negative for chest tightness, shortness of breath and wheezing.   Cardiovascular: Negative for chest pain.  Gastrointestinal: Negative for abdominal pain, diarrhea, nausea and vomiting.  Genitourinary: Negative for decreased urine volume, difficulty urinating and  dysuria.  Musculoskeletal: Negative for arthralgias, myalgias, neck pain and neck stiffness.  Skin: Negative for rash.  Neurological: Negative for dizziness, weakness and numbness.  Hematological: Negative for adenopathy.     Physical Exam Updated Vital Signs BP (!) 158/129 (BP Location: Right Arm)    Pulse (!) 123    Temp 98.3 F (36.8 C) (Oral)    Resp 20    Ht 5\' 9"  (1.753 m)    Wt 59.4 kg    SpO2 98%    BMI 19.35 kg/m   Physical Exam Vitals signs and nursing note reviewed.  Constitutional:      Appearance: Normal appearance. She is not ill-appearing or toxic-appearing.  HENT:     Head: Normocephalic.     Nose: Congestion present.     Mouth/Throat:     Mouth: Mucous membranes are moist.     Pharynx: Oropharynx is clear. No oropharyngeal exudate.     Comments: Uvula is midline and nonedematous.  Oropharynx is mildly erythematous without edema or exudate. Neck:     Musculoskeletal: Normal range of motion. No muscular tenderness.     Thyroid: No thyromegaly.  Cardiovascular:     Rate and Rhythm: Regular rhythm. Tachycardia present.     Pulses: Normal pulses.  Pulmonary:     Effort: Pulmonary effort is normal. No respiratory distress.     Breath sounds: Normal breath sounds. No wheezing or rhonchi.  Abdominal:     General: There is no distension.     Palpations: Abdomen is soft.     Tenderness: There is no abdominal tenderness. There is no guarding.  Musculoskeletal: Normal range of motion.     Right lower leg: No edema.     Left lower leg: No edema.  Lymphadenopathy:     Cervical: No cervical adenopathy.  Skin:    General: Skin is warm.     Findings: No rash.  Neurological:     General: No focal deficit present.     Mental Status: She is alert.      ED Treatments / Results  Labs (all labs ordered are listed, but only abnormal results are displayed) Labs Reviewed  BASIC METABOLIC PANEL - Abnormal; Notable for the following components:      Result Value    Potassium 3.2 (*)    Glucose, Bld 102 (*)    Calcium 8.8 (*)    All other components within normal limits  URINALYSIS, ROUTINE W REFLEX MICROSCOPIC - Abnormal; Notable for the following components:   APPearance HAZY (*)    Protein, ur 100 (*)    All other components within normal limits  D-DIMER,  QUANTITATIVE (NOT AT [facility]) - Abnormal; Notable for the following components:   D-Dimer, Quant 1.46 (*)    All other components within normal limits  SARS CORONAVIRUS 2 (TAT 6-24 HRS)  CBC WITH DIFFERENTIAL/PLATELET  TSH  POC URINE PREG, ED  TROPONIN I (HIGH SENSITIVITY)  TROPONIN I (HIGH SENSITIVITY)    EKG EKG Interpretation  Date/Time:  Thursday May 10 2019 11:18:41 EST Ventricular Rate:  119 PR Interval:    QRS Duration: 98 QT Interval:  453 QTC Calculation: 638 R Axis:   75 Text Interpretation: Sinus or ectopic atrial tachycardia Atrial premature complex Prolonged QT interval No significant change since last tracing Confirmed by Jacalyn Lefevre [phone]) on 05/10/2019 11:30:31 AM   Radiology Ct Angio Chest Pe W And/or Wo Contrast  Result Date: 05/10/2019 CLINICAL DATA:  Evaluate for acute pulmonary embolus. High pretest probability. EXAM: CT ANGIOGRAPHY CHEST WITH CONTRAST TECHNIQUE: Multidetector CT imaging of the chest was performed using the standard protocol during bolus administration of intravenous contrast. Multiplanar CT image reconstructions and MIPs were obtained to evaluate the vascular anatomy. CONTRAST:  OMNIPAQUE IOHEXOL 350 MG/ML SOLN COMPARISON:  None. FINDINGS: Cardiovascular: Satisfactory opacification of the pulmonary arteries to the segmental level. No evidence of pulmonary embolism. The heart size is enlarged. No pericardial effusion. Mediastinum/Nodes: No enlarged mediastinal, hilar, or axillary lymph nodes. Thyroid gland, trachea, and esophagus demonstrate no significant findings. Lungs/Pleura: There are bilateral pleural effusions, right greater than  left. Bilateral upper and lower lung zone interlobular septal thickening is identified. Ground-glass attenuation is noted in both lungs with a lower lung zone predominance. Upper Abdomen: No acute abnormality. Musculoskeletal: No chest wall abnormality. No acute or significant osseous findings. Review of the MIP images confirms the above findings. IMPRESSION: 1. No evidence for acute pulmonary embolus. 2. Cardiac enlargement, bilateral pleural effusions and pulmonary edema. Imaging findings are concerning for congestive heart failure. Electronically Signed   By: Signa Kell M.D.   On: 05/10/2019 13:36   Dg Chest Portable 1 View  Result Date: 05/10/2019 CLINICAL DATA:  Cough for 3 months.  Fever and sneezing for 3 days. EXAM: PORTABLE CHEST 1 VIEW COMPARISON:  None. FINDINGS: The patient has bibasilar airspace disease. The cardiopericardial silhouette is enlarged. No pneumothorax or pleural effusion. No acute or focal bony abnormality. IMPRESSION: Bibasilar airspace disease worrisome for pneumonia. Enlarged cardiopericardial silhouette is new since the prior examination and worrisome for pericardial effusion. Electronically Signed   By: Drusilla Kanner M.D.   On: 05/10/2019 10:03   Echocardiogram shows Left ventricular ejection fraction, by visual estimation, is approximately 15%. The left ventricle has severely decreased function. There is borderline left ventricular hypertrophy. Prominent posterolateral trabeculation. Transverse false tendon in mid LV (normal variant).  Also shows trivial pericardial effusion.    Procedures Procedures (including critical care time)  Medications Ordered in ED Medications  sodium chloride 0.9 % bolus 1,000 mL (has no administration in time range)     Initial Impression / Assessment and Plan / ED Course  I have reviewed the triage vital signs and the nursing notes.  Pertinent labs & imaging results that were available during my care of the patient were  reviewed by me and considered in my medical decision making (see chart for details).        Patient is actively coughing during my exam, lung sounds are clear to auscultation bilaterally.  She is noted to be hypertensive and tachycardic.  She states that she has had an elevated heart rate for "  some time" but the reason is unclear.  Will obtain labs including D-dimer, COVID test and administer IV fluids.    Clinical suspicion for Covid is high, results are pending.  CT angio chest is negative for PE or evidence of pericarditis.  Echocardiogram shows poor left ventricular ejection fraction at 15 %,  also minimal pericardial effusion.  On recheck, patient reports feeling better.  Heart rate slightly improved after Lopressor and she was given tessalon for the cough.  Chronic cough may be secondary to ACE inhibitor.  With EF of 15%, pt will need admission.    14  I have spoken with Bernerd Pho, PA with [facility] heart[facility] who agrees to see pt tomorrow.  I will consult hospitalist for admission  1650  Spoke with hospitalist, Dr. Denton Brick who agrees to admit.    Final Clinical Impressions(s) / ED Diagnoses   Final diagnoses:  Systolic CHF, acute [facility])  Viral illness    ED Discharge Orders    None       Kem Parkinson, PA-C 05/10/19 1704    Isla Pence, MD 05/11/19 [phone]

## 2019-05-11 DIAGNOSIS — E876 Hypokalemia: Secondary | ICD-10-CM

## 2019-05-11 LAB — BASIC METABOLIC PANEL
Anion gap: 12 (ref 5–15)
BUN: 15 mg/dL (ref 6–20)
CO2: 20 mmol/L — ABNORMAL LOW (ref 22–32)
Calcium: 8.5 mg/dL — ABNORMAL LOW (ref 8.9–10.3)
Chloride: 107 mmol/L (ref 98–111)
Creatinine, Ser: 0.75 mg/dL (ref 0.44–1.00)
GFR calc Af Amer: >60 mL/min (ref >60)
GFR calc non Af Amer: >60 mL/min (ref >60)
Glucose, Bld: 93 mg/dL (ref 70–99)
Potassium: 3.9 mmol/L (ref 3.5–5.1)
Sodium: 139 mmol/L (ref 135–145)

## 2019-05-11 LAB — HEPATIC FUNCTION PANEL
ALT: 25 U/L (ref 0–44)
AST: 42 U/L — ABNORMAL HIGH (ref 15–41)
Albumin: 3.7 g/dL (ref 3.5–5.0)
Alkaline Phosphatase: 72 U/L (ref 38–126)
Bilirubin, Direct: 0.4 mg/dL — ABNORMAL HIGH (ref 0.0–0.2)
Indirect Bilirubin: 2.2 mg/dL — ABNORMAL HIGH (ref 0.3–0.9)
Total Bilirubin: 2.6 mg/dL — ABNORMAL HIGH (ref 0.3–1.2)
Total Protein: 6.4 g/dL — ABNORMAL LOW (ref 6.5–8.1)

## 2019-05-11 LAB — HEMOGLOBIN A1C
Hgb A1c MFr Bld: 5.4 % (ref 4.8–5.6)
Mean Plasma Glucose: 108.28 mg/dL

## 2019-05-11 LAB — HIV ANTIBODY (ROUTINE TESTING W REFLEX): HIV Screen 4th Generation wRfx: NONREACTIVE

## 2019-05-11 MED ORDER — LOSARTAN POTASSIUM 25 MG PO TABS
25.0000 mg | ORAL_TABLET | Freq: Every day | ORAL | Status: DC
  Administered 2019-05-11 – 2019-05-13 (×3): 25 mg via ORAL
  Filled 2019-05-11 (×3): qty 1

## 2019-05-11 MED ORDER — HYDRALAZINE HCL 25 MG PO TABS
25.0000 mg | ORAL_TABLET | Freq: Once | ORAL | Status: AC
  Administered 2019-05-11: 22:00:00 25 mg via ORAL
  Filled 2019-05-11: qty 1

## 2019-05-11 NOTE — Progress Notes (Signed)
[facility] arrived to pick patient up for transfer to [facility].  Patient given medication for BP=162/128 prior to leaving with [facility].

## 2019-05-11 NOTE — Consult Note (Addendum)
Cardiology Consult    Patient ID: Tina Fletcher; [id number]; July 03, 1985   Admit date: 05/10/2019 Date of Consult: 05/11/2019  Primary Care Provider: Merlyn AlbertLuking, William S, MD Primary Cardiologist: [facility] Wyline Fletcher  Patient Profile    Tina Fletcher is Fletcher 33 y.o. female with past medical history of HTN and no prior cardiac history who is being seen today for the evaluation of new cardiomyopathy at the request of Dr. Mariea Fletcher.   History of Present Illness    [name] reports being in her usual state of health until about 3 months ago when she developed Fletcher dry cough. Over the past month, her cough has worsened and she has experienced worsening dyspnea on exertion and orthopnea (sleeping with 4 pillows). Denies any chest pain or lower extremity edema. Has gained 14 lbs within the past 3 months but she thought this was secondary to inactivity. She has chronic palpitations and says her HR is usually when in the low-100'Fletcher but she has not been on medications for this in the past.   She has known HTN but does not take her Lisinopril regularly due to "not liking to take medications". She denies any known history of CAD or CHF. She reports Fletcher family history of both with her maternal uncle having CAD (diagnosed in his 6940'Fletcher) and Fletcher maternal aunt with CAD and CHF (diagnosed in her 3850'Fletcher). No known history of either in her parents or siblings. Over the past 4-5 months, she had been out of work due to COVID-19 and says she started consuming liquor several days per week. Reports consuming 7-8 airplane bottles of liquor at least 3-4 days each week. Prior to COVID, she reports only consuming alcohol socially. Denies any tobacco use or recreational drug use.   BP was elevated to 158/129 upon arrival to the ED. Initial labs show WBC 5.9, Hgb 12.3, platelets 221, Na+ 141, K+ 3.2 and creatinine 0.66. BNP 810. D-dimer 1.46. Initial and delta HS Troponin values negative at 13. TSH 0.906. COVID negative.  CXR showed bibasilar airspace disease concerning for PNA and cardiac enlargement with concern for pericardial effusion. CTA negative for PE but did show bilateral pleural effusions and pulmonary edema. EKG shows NSR, HR 95 with LVH and diffuse TWI along inferior and lateral leads most consistent with repol but new since prior tracing in 2019.  Echocardiogram on admission showed Fletcher reduced EF of 15% with borderline LVH. RV function reduced as well. Did have Fletcher trivial pericardial effusion and mild MR and TR.   She has been started on IV Lasix 40mg  BID (received two doses thus far). I&O'Fletcher not fully recorded but she received over 2L of fluid in the ED yesterday due to concern for dehydration. Weight recorded as 134 lbs this morning and she reports Fletcher baseline in the 120'Fletcher.    Past Medical History:  Diagnosis Date   Abnormal menstrual periods 02/12/2014   Herpes simplex without mention of complication    Hypertension    Medical history non-contributory    Migraines    Nexplanon insertion 08/13/2013   08/13/13 inserted left arm, remove 08/13/16   Vitamin D deficiency 04/01/2016    Past Surgical History:  Procedure Laterality Date   NO PAST SURGERIES       Home Medications:  Prior to Admission medications   Medication Sig Start Date End Date Taking? Authorizing Provider  acetaminophen (TYLENOL) 325 MG tablet Take 650 mg by mouth every 6 (six) hours as needed.  Yes [provider]  lisinopril (PRINIVIL,ZESTRIL) 20 MG tablet Take 1 tablet (20 mg total) by mouth daily. 02/04/17  Yes Tina BerkshireZammit, Joseph, MD  etonogestrel (NEXPLANON) 68 MG IMPL implant Inject 1 each into the skin once.    [provider]  magic mouthwash w/lidocaine SOLN Take 10 mLs by mouth 4 (four) times daily as needed (throat pain). Gargle and spit for throat pain relief   Note to pharmacy - equal parts diphendydramine, aluminum hydroxide and lidocaine HCL Patient not taking: Reported on 05/10/2019 11/20/17    Tina AmorIdol, Julie, PA-C  megestrol (MEGACE) 40 MG tablet TAKE (1) TABLET BY MOUTH TWICE DAILY. Patient not taking: Reported on 05/10/2019 04/25/17   Tina PotterGriffin, Jennifer A, NP  traMADol (ULTRAM) 50 MG tablet Take by mouth every 6 (six) hours as needed.    [provider]    Inpatient Medications: Scheduled Meds:  enoxaparin (LOVENOX) injection  40 mg Subcutaneous Q24H   furosemide  40 mg Intravenous Q12H   Continuous Infusions:  PRN Meds: acetaminophen **OR** acetaminophen, polyethylene glycol, zolpidem  Allergies:   No Known Allergies  Social History:   Social History   Socioeconomic History   Marital status: Single    Spouse name: Not on file   Number of children: Not on file   Years of education: Not on file   Highest education level: Not on file  Occupational History   Not on file  Social Needs   Financial resource strain: Not on file   Food insecurity    Worry: Not on file    Inability: Not on file   Transportation needs    Medical: Not on file    Non-medical: Not on file  Tobacco Use   Smoking status: Never Smoker   Smokeless tobacco: Never Used  Substance and Sexual Activity   Alcohol use: Yes    Comment: occ   Drug use: No   Sexual activity: Yes    Birth control/protection: Implant  Lifestyle   Physical activity    Days per week: Not on file    Minutes per session: Not on file   Stress: Not on file  Relationships   Social connections    Talks on phone: Not on file    Gets together: Not on file    Attends religious service: Not on file    Active member of club or organization: Not on file    Attends meetings of clubs or organizations: Not on file    Relationship status: Not on file   Intimate partner violence    Fear of current or ex partner: Not on file    Emotionally abused: Not on file    Physically abused: Not on file    Forced sexual activity: Not on file  Other Topics Concern   Not on file  Social History Narrative    Not on file     Family History:    Family History  Problem Relation Age of Onset   Diabetes Maternal Grandmother    Cancer Paternal Grandfather        prancreatic   COPD Maternal Grandfather    Cancer Maternal Grandfather        prostate   Asthma Maternal Grandfather    Asthma Son    Bronchitis Son       Review of Systems    General:  No chills, fever, night sweats or weight changes.  Cardiovascular:  No chest pain, paroxysmal nocturnal dyspnea. Positive for dyspnea on exertion, orthopnea and  palpitations.  Dermatological: No rash, lesions/masses Respiratory: Positive for dry cough and dyspnea. Urologic: No hematuria, dysuria Abdominal:   No nausea, vomiting, diarrhea, bright red blood per rectum, melena, or hematemesis Neurologic:  No visual changes, wkns, changes in mental status. All other systems reviewed and are otherwise negative except as noted above.  Physical Exam/Data    Vitals:   05/10/19 1828 05/10/19 2054 05/11/19 0642 05/11/19 0648  BP: (!) 161/117  (!) 150/123 (!) 146/110  Pulse: 93  97   Resp: (!) 24  17   Temp:   98.9 F (37.2 C)   TempSrc:   Oral   SpO2: 100% 99% 100%   Weight: 60.9 kg     Height: 5\' 9"  (1.753 m)       Intake/Output Summary (Last 24 hours) at 05/11/2019 0804 Last data filed at 05/10/2019 1802 Gross per 24 hour  Intake 4846.15 ml  Output --  Net 4846.15 ml   Filed Weights   05/10/19 0906 05/10/19 1828  Weight: 59.4 kg 60.9 kg   Body mass index is 19.83 kg/m.   General: Pleasant, thin female appearing in NAD Psych: Normal affect. Neuro: Alert and oriented X 3. Moves all extremities spontaneously. HEENT: Normal  Neck: Supple without bruits or JVD. Lungs:  Resp regular and unlabored, decreased along bases bilaterally. Heart: RRR no s3, s4, or murmurs. Abdomen: Soft, non-tender, non-distended, BS + x 4.  Extremities: No clubbing, cyanosis or lower extremity edema. DP/PT/Radials 2+ and equal bilaterally.   EKG:   The EKG was personally reviewed and demonstrates: NSR, HR 95 with LVH and diffuse TWI along inferior and lateral leads most consistent with repol but new since prior tracing in 2019.  Telemetry:  Telemetry was personally reviewed and demonstrates: NSR, HR in 80'Fletcher to 90'Fletcher with episodes of sinus tachycardia with HR in the 110'Fletcher.    Labs/Studies     Relevant CV Studies:  Echocardiogram: 05/10/2019 IMPRESSIONS    1. Left ventricular ejection fraction, by visual estimation, is approximately 15%. The left ventricle has severely decreased function. There is borderline left ventricular hypertrophy. Prominent posterolateral trabeculation. Transverse false tendon in  mid LV (normal variant).  2. Elevated left ventricular end-diastolic pressure.  3. Left ventricular diastolic parameters are indeterminate.  4. The left ventricle demonstrates global hypokinesis.  5. Global right ventricle shows reduced contraction.The right ventricular size is normal. No increase in right ventricular wall thickness.  6. Left atrial size was moderately dilated.  7. Right atrial size was normal.  8. Trivial pericardial effusion is present.  9. The pericardial effusion is posterior to the left ventricle and anterior to the right ventricle. 10. The mitral valve is grossly normal. Mild mitral valve regurgitation. 11. The tricuspid valve is grossly normal. Tricuspid valve regurgitation is mild. 12. The aortic valve is tricuspid. Aortic valve regurgitation is not visualized. 13. The pulmonic valve was grossly normal. Pulmonic valve regurgitation is trivial. 14. The inferior vena cava is dilated in size with >50% respiratory variability, suggesting right atrial pressure of 8 mmHg. 15. TR signal is inadequate for assessing pulmonary artery systolic pressure.  Laboratory Data:  Chemistry Recent Labs  Lab 05/10/19 1002 05/11/19 0441  NA 141 139  K 3.2* 3.9  CL 106 107  CO2 22 20*  GLUCOSE 102* 93  BUN 17 15    CREATININE 0.66 0.75  CALCIUM 8.8* 8.5*  GFRNONAA >60 >60  GFRAA >60 >60  ANIONGAP 13 12    No results for input(Fletcher): PROT, ALBUMIN, AST,  ALT, ALKPHOS, BILITOT in the last 168 hours. Hematology Recent Labs  Lab 05/10/19 1002  WBC 5.9  RBC 4.24  HGB 12.3  HCT 37.8  MCV 89.2  MCH 29.0  MCHC 32.5  RDW 14.5  PLT 221   Cardiac EnzymesNo results for input(Fletcher): TROPONINI in the last 168 hours. No results for input(Fletcher): TROPIPOC in the last 168 hours.  BNP Recent Labs  Lab 05/10/19 1007  BNP 810.0*    DDimer  Recent Labs  Lab 05/10/19 1007  DDIMER 1.46*    Radiology/Studies:  Ct Angio Chest Pe W And/or Wo Contrast  Result Date: 05/10/2019 CLINICAL DATA:  Evaluate for acute pulmonary embolus. High pretest probability. EXAM: CT ANGIOGRAPHY CHEST WITH CONTRAST TECHNIQUE: Multidetector CT imaging of the chest was performed using the standard protocol during bolus administration of intravenous contrast. Multiplanar CT image reconstructions and MIPs were obtained to evaluate the vascular anatomy. CONTRAST:  OMNIPAQUE IOHEXOL 350 MG/ML SOLN COMPARISON:  None. FINDINGS: Cardiovascular: Satisfactory opacification of the pulmonary arteries to the segmental level. No evidence of pulmonary embolism. The heart size is enlarged. No pericardial effusion. Mediastinum/Nodes: No enlarged mediastinal, hilar, or axillary lymph nodes. Thyroid gland, trachea, and esophagus demonstrate no significant findings. Lungs/Pleura: There are bilateral pleural effusions, right greater than left. Bilateral upper and lower lung zone interlobular septal thickening is identified. Ground-glass attenuation is noted in both lungs with Fletcher lower lung zone predominance. Upper Abdomen: No acute abnormality. Musculoskeletal: No chest wall abnormality. No acute or significant osseous findings. Review of the MIP images confirms the above findings. IMPRESSION: 1. No evidence for acute pulmonary embolus. 2. Cardiac enlargement,  bilateral pleural effusions and pulmonary edema. Imaging findings are concerning for congestive heart failure. Electronically Signed   By: Signa Kell M.D.   On: 05/10/2019 13:36   Dg Chest Portable 1 View  Result Date: 05/10/2019 CLINICAL DATA:  Cough for 3 months.  Fever and sneezing for 3 days. EXAM: PORTABLE CHEST 1 VIEW COMPARISON:  None. FINDINGS: The patient has bibasilar airspace disease. The cardiopericardial silhouette is enlarged. No pneumothorax or pleural effusion. No acute or focal bony abnormality. IMPRESSION: Bibasilar airspace disease worrisome for pneumonia. Enlarged cardiopericardial silhouette is new since the prior examination and worrisome for pericardial effusion. Electronically Signed   By: Drusilla Kanner M.D.   On: 05/10/2019 10:03     Assessment & Plan    1. New Cardiomyopathy/ Acute Systolic CHF - presented with Fletcher worsening dry cough over the past 3 months with progressive dyspnea on exertion and 4-pillow orthopnea for the past month with associated 14 lb weight gain.  - BNP 810 on admission. D-dimer 1.46 but CTA negative for PE. Echo shows Fletcher reduced EF of 15% with borderline LVH. RV function reduced as well.  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 She was consuming 7-8 airplane bottles of liquor at least 3-4 days each week for the past few months leading to admission.  - would continue with IV Lasix 40mg  BID. Repeat BMET tomorrow. Continue to follow I&O'Fletcher along with daily weights.  - was prescribed Lisinopril as an outpatient but not taking regularly. Will review with Dr. Wyline Mood in regards to starting ARB or possibly Entresto once 36-hour wash out period confirmed. Would consider Spironolactone as well if BP allows. Would hold on BB for now given concern for low-output heart failure. Will review with MD in regards to transfer to [facility] for AHF team consult as inpatient and further workup.  2. HTN - was  noncompliant with Lisinopril prior to  admission. Will review medication regimen with MD as outlined above.   3. Hypokalemia - K+ 3.2 on admission, at 3.9 this AM. Repeat BMET in AM given the use of IV Lasix.   For questions or updates, please contact [facility] Please consult [email or web address] for contact info under Cardiology/STEMI.  Signed, Erma Heritage, PA-C 05/11/2019, 8:04 AM Pager: [phone]  Attending note  Patient seen and discussed with PA Ahmed Prima, I agree with her documentation above. 33 yo female history of HTN presented to the ED with persistent nonproductive dry cough x 3 months. Reported low grade fever, sneezing, rhinorrhea with increasing fatigue. Son had similar symptoms but they resolved. Reported COVID neg 3 weeks ago. She also reports progression DOE, LE edema and orthopnea.    ER vitals: bp 158/129 p 123 98% RA COVID neg K 3.2 Cr 0.66 BUN 17 WBC 5.9 Hgb 12.3 Plt 221 TSH 0.9 Ddimer 1.46 BNP 810 Mg 1.6  Urine preg neg HIV pending hstrop 13-->13 EKG sinus tach 120, LAE. Follow up EKG LVH with inferior and lateral precordial TWIs likely strain pattern CXR bibasilar aispace disease, ? Pneumonia, Cardiomegaly CT PE no PE, cardiomegaly,bilateral pleural effusions and pulmonary edema.  Echo LVEF 27%, indet diastolic function, global hypokinesis, decreased RV function, mild MR, dilated IVC. From my review has severe LAE, RV appears Fletcher little enlarged but objective TAPSE and RV Fletcher' would suggest low normal function   Severe LV systolic dysfunction and acute HF in young patient. The severe LAE would suggest she has had at least some chronic form of dysfunction for some time, whethere there is an acute etiology on top is unclear.Marland Kitchen History of HTN with medication noncompliance. Has had fairly high EtOH use recently. Some recent prolonged viral symptoms over the last few weeks. Young with limited risk factors for high concern of ICM. No significant family history of cardiomyopathy.  Children are 5 and 10, no  recent pregnancies.   The degree of her LV dysfunction and presenting volume overloaded and tachycardic is concerning for significant low output.   I/Os are incomplete. She is on IV lasix 40mg  bid. Actually received 2L of NS in the ER and likely is net positive thus far though exam is not overwhelming. Continue IV diuretics. She took lisinopril yesterday, change to losartan 25 mg daily today, convert to entresto once has completed ACEI washout. Hold on beta blocker given acute decompensation, may wait until we know her CI from [place] prior to starting given potential for low output with very low EF and tachycardia on admission.  I would plan on diuresis over the weekend with initaition of medical therapy, LHC/RHC on Monday. Pending hemodynamics further adjustements of medical therapy, possible cardiac MRI pending cath results. After cath may warrant CHF team consultation.    Manual QTc is 450, computer measurement appears inaccurate.   Carlyle Dolly MD

## 2019-05-11 NOTE — Progress Notes (Signed)
Patient discussed with Dr. Harl Bowie. She has been admitted to the hospital with new cardiomyopathy with EF of 15%. Plan will be to transfer the patient to [facility], [place] for diuresis and likely right and left heart cath on Monday. Patient will be transferred to cardiology service.   We appreciate cardiology assistance in caring for this patient. Please call us back for any questions or concerns. Will plan on signing off once patient leaves Tina Fletcher.  [employer]

## 2019-05-11 NOTE — Progress Notes (Signed)
Called report to nurse, Louann Liv

## 2019-05-11 NOTE — Progress Notes (Signed)
Pt arrived to floor via [facility] . BP 136/102. Medicated prior to transport. Cardiology notified of patient arrival and current BP.

## 2019-05-11 NOTE — Plan of Care (Signed)

## 2019-05-11 NOTE — Progress Notes (Signed)
Report given via telephone to Eldridge at 2120.

## 2019-05-12 DIAGNOSIS — I429 Cardiomyopathy, unspecified: Secondary | ICD-10-CM

## 2019-05-12 DIAGNOSIS — I5021 Acute systolic (congestive) heart failure: Secondary | ICD-10-CM

## 2019-05-12 LAB — BASIC METABOLIC PANEL
Anion gap: 16 — ABNORMAL HIGH (ref 5–15)
BUN: 12 mg/dL (ref 6–20)
CO2: 21 mmol/L — ABNORMAL LOW (ref 22–32)
Calcium: 9 mg/dL (ref 8.9–10.3)
Chloride: 101 mmol/L (ref 98–111)
Creatinine, Ser: 0.99 mg/dL (ref 0.44–1.00)
GFR calc Af Amer: >60 mL/min (ref >60)
GFR calc non Af Amer: >60 mL/min (ref >60)
Glucose, Bld: 109 mg/dL — ABNORMAL HIGH (ref 70–99)
Potassium: 3.2 mmol/L — ABNORMAL LOW (ref 3.5–5.1)
Sodium: 138 mmol/L (ref 135–145)

## 2019-05-12 LAB — GLUCOSE, CAPILLARY: Glucose-Capillary: 152 mg/dL — ABNORMAL HIGH (ref 70–99)

## 2019-05-12 MED ORDER — POTASSIUM CHLORIDE CRYS ER 20 MEQ PO TBCR
20.0000 meq | EXTENDED_RELEASE_TABLET | Freq: Two times a day (BID) | ORAL | Status: DC
  Administered 2019-05-12 – 2019-05-14 (×5): 20 meq via ORAL
  Filled 2019-05-12 (×5): qty 1

## 2019-05-12 MED ORDER — ISOSORB DINITRATE-HYDRALAZINE 20-37.5 MG PO TABS
0.5000 | ORAL_TABLET | Freq: Three times a day (TID) | ORAL | Status: DC
  Administered 2019-05-12: 0.5 via ORAL
  Filled 2019-05-12 (×2): qty 1

## 2019-05-12 MED ORDER — IVABRADINE HCL 5 MG PO TABS
5.0000 mg | ORAL_TABLET | Freq: Two times a day (BID) | ORAL | Status: DC
  Administered 2019-05-12 – 2019-05-14 (×4): 5 mg via ORAL
  Filled 2019-05-12 (×6): qty 1

## 2019-05-12 MED ORDER — SPIRONOLACTONE 12.5 MG HALF TABLET
12.5000 mg | ORAL_TABLET | Freq: Every day | ORAL | Status: DC
  Administered 2019-05-12 – 2019-05-14 (×3): 12.5 mg via ORAL
  Filled 2019-05-12 (×3): qty 1

## 2019-05-12 NOTE — Plan of Care (Signed)

## 2019-05-12 NOTE — Consult Note (Signed)
Advanced Heart Failure Team Consult Note   Primary Physician: Merlyn Albert, MD PCP-Cardiologist:  No primary care provider on file.  Reason for Consultation: CHF  HPI:    Tina Fletcher is seen today for evaluation of CHF at the request of Dr. Wyline Mood.   Patient has a prior history of hypertension for which she takes lisinopril (though often misses doses).  She reports a nonproductive cough for about 3 months that has gradually worsened. She developed orthopnea, requiring 4 pillow so that she could sleep without coughing.  She started to note bendopnea.  She says that she has not been short of breath with exertion, just came to the ER because of worsening cough.   She was noted to be in sinus tachycardia with elevated BNP.  HS-TnI negative and COVID-19 negative.  CTA chest showed no PE but there was pulmonary edema.  Echo was done, showing LV EF 15%, decreased RV systolic function, mild MR.   She reports heavier ETOH intake over the last few months since out of work due to COVID-19, 7-8 airplane bottles of liquor 3-4 days/week.  Prior to the last few months, she only drank occasional ETOH socially. No family history of CMP.  Uncle and aunt had CAD diagnosed in their 69s but not parents or siblings. No recent pregnancy.  HIV negative.  No chest pain.   She was admitted to [facility] and transferred to [facility] for further evaluation.  She is getting Lasix 40 mg IV bid, I/Os not recorded.  She says that her breathing is ok today.   Review of Systems: All systems reviewed and negative except as per HPI.   Home Medications Prior to Admission medications   Medication Sig Start Date End Date Taking? Authorizing Provider  acetaminophen (TYLENOL) 325 MG tablet Take 650 mg by mouth every 6 (six) hours as needed.   Yes [provider]  lisinopril (PRINIVIL,ZESTRIL) 20 MG tablet Take 1 tablet (20 mg total) by mouth daily. 02/04/17  Yes Bethann Berkshire, MD  etonogestrel (NEXPLANON) 68  MG IMPL implant Inject 1 each into the skin once.    [provider]  magic mouthwash w/lidocaine SOLN Take 10 mLs by mouth 4 (four) times daily as needed (throat pain). Gargle and spit for throat pain relief   Note to pharmacy - equal parts diphendydramine, aluminum hydroxide and lidocaine HCL Patient not taking: Reported on 05/10/2019 11/20/17   Burgess Amor, PA-C  megestrol (MEGACE) 40 MG tablet TAKE (1) TABLET BY MOUTH TWICE DAILY. Patient not taking: Reported on 05/10/2019 04/25/17   Adline Potter, NP  traMADol (ULTRAM) 50 MG tablet Take by mouth every 6 (six) hours as needed.    [provider]    Past Medical History: 1. HTN 2. Migraines  Past Surgical History: Past Surgical History:  Procedure Laterality Date  . NO PAST SURGERIES      Family History: Family History  Problem Relation Age of Onset  . Diabetes Maternal Grandmother   . Cancer Paternal Grandfather        prancreatic  . COPD Maternal Grandfather   . Cancer Maternal Grandfather        prostate  . Asthma Maternal Grandfather   . Asthma Son   . Bronchitis Son     Social History: Social History   Socioeconomic History  . Marital status: Single    Spouse name: Not on file  . Number of children: Not on file  . Years of education:  Not on file  . Highest education level: Not on file  Occupational History  . Not on file  Social Needs  . Financial resource strain: Not on file  . Food insecurity    Worry: Not on file    Inability: Not on file  . Transportation needs    Medical: Not on file    Non-medical: Not on file  Tobacco Use  . Smoking status: Never Smoker  . Smokeless tobacco: Never Used  Substance and Sexual Activity  . Alcohol use: Yes    Comment: occ  . Drug use: No  . Sexual activity: Yes    Birth control/protection: Implant  Lifestyle  . Physical activity    Days per week: Not on file    Minutes per session: Not on file  . Stress: Not on file  Relationships   . Social Musician on phone: Not on file    Gets together: Not on file    Attends religious service: Not on file    Active member of club or organization: Not on file    Attends meetings of clubs or organizations: Not on file    Relationship status: Not on file  Other Topics Concern  . Not on file  Social History Narrative  . Not on file    Allergies:  No Known Allergies  Objective:    Vital Signs:   Temp:  [98.1 F (36.7 C)-98.4 F (36.9 C)] 98.1 F (36.7 C) (11/14 0815) Pulse Rate:  [98-129] 107 (11/14 0847) Resp:  [16-20] 20 (11/14 0815) BP: (136-162)/(98-128) 137/98 (11/14 0847) SpO2:  [99 %-100 %] 99 % (11/14 0847) Weight:  [55 kg-55.2 kg] 55.2 kg (11/14 0500) Last BM Date: 05/11/19  Weight change: Filed Weights   05/10/19 1828 05/11/19 2237 05/12/19 0500  Weight: 60.9 kg 55 kg 55.2 kg    Intake/Output:   Intake/Output Summary (Last 24 hours) at 05/12/2019 1207 Last data filed at 05/12/2019 1124 Gross per 24 hour  Intake 120 ml  Output 480 ml  Net -360 ml      Physical Exam    General:  Well appearing. No resp difficulty HEENT: normal Neck: supple. JVP 8-9 cm. Carotids 2+ bilat; no bruits. No lymphadenopathy or thyromegaly appreciated. Cor: PMI nondisplaced. Mildly tachy, regular rate & rhythm. No rubs, gallops or murmurs. Lungs: clear Abdomen: soft, nontender, nondistended. No hepatosplenomegaly. No bruits or masses. Good bowel sounds. Extremities: no cyanosis, clubbing, rash, edema Neuro: alert & orientedx3, cranial nerves grossly intact. moves all 4 extremities w/o difficulty. Affect pleasant   Telemetry   Sinus tachy 108 (personally reviewed)  EKG    NSR, LVH with repolarization abnormality (personally reviewed)  Labs   Basic Metabolic Panel: Recent Labs  Lab 05/10/19 1002 05/10/19 1007 05/11/19 0441  NA 141  --  139  K 3.2*  --  3.9  CL 106  --  107  CO2 22  --  20*  GLUCOSE 102*  --  93  BUN 17  --  15  CREATININE  0.66  --  0.75  CALCIUM 8.8*  --  8.5*  MG  --  1.6*  --     Liver Function Tests: Recent Labs  Lab 05/11/19 0441  AST 42*  ALT 25  ALKPHOS 72  BILITOT 2.6*  PROT 6.4*  ALBUMIN 3.7   No results for input(s): LIPASE, AMYLASE in the last 168 hours. No results for input(s): AMMONIA in the last 168 hours.  CBC: Recent  Labs  Lab 05/10/19 1002  WBC 5.9  NEUTROABS 4.2  HGB 12.3  HCT 37.8  MCV 89.2  PLT 221    Cardiac Enzymes: No results for input(s): CKTOTAL, CKMB, CKMBINDEX, TROPONINI in the last 168 hours.  BNP: BNP (last 3 results) Recent Labs    05/10/19 1007  BNP 810.0*    ProBNP (last 3 results) No results for input(s): PROBNP in the last 8760 hours.   CBG: No results for input(s): GLUCAP in the last 168 hours.  Coagulation Studies: No results for input(s): LABPROT, INR in the last 72 hours.   Imaging    No results found.   Medications:     Current Medications: . enoxaparin (LOVENOX) injection  40 mg Subcutaneous Q24H  . furosemide  40 mg Intravenous Q12H  . isosorbide-hydrALAZINE  0.5 tablet Oral TID  . ivabradine  5 mg Oral BID WC  . losartan  25 mg Oral Daily  . spironolactone  12.5 mg Oral Daily     Infusions:     Assessment/Plan   1. Acute systolic CHF: Cardiomyopathy of uncertain etiology.  She has been symptomatic for about 3 months.  No FH cardiomyopathy, negative HIV and normal TSH, no recent pregnancy.  She has been drinking heavily for the last several months, ETOH may contribute to her cardiomyopathy.  Uncontrolled HTN may play a role.  Cannot rule out viral myocarditis as initiating event.  CAD is possible but unlikely, no chest pain.  Currently, sinus tachy in 100s with elevated BP.  Exam somewhat difficult for volume but does not look markedly overloaded today after IV Lasix yesterday.  - For today, continue Lasix 40 mg IV bid.  - Continue losartan 25 mg daily.  She was on lisinopril prior to admission, will make  transition to [facility] tomorrow.  - Bidil 1/2 tab tid. - Spironolactone 12.5 daily.  - Corlanor 5 mg bid.  - I will plan RHC/LHC on Monday to assess filling pressures and cardiac output, also will do angiography to rule out CAD.  I discussed risks/benefits with patient and she agrees.  - If no CAD, will arrange for cardiac MRI to look for infiltrative disease/myocarditis.  2. HTN: Controlling with meds as above.  3. ETOH abuse: Drinking fairly heavily prior to admit, discussed stopping.   Length of Stay: 2  Loralie Champagne, MD  05/12/2019, 12:07 PM  Advanced Heart Failure Team Pager [phone] (M-F; 7a - 4p)  Please contact [facility] for night-coverage after hours (4p -7a ) and weekends on [email or web address]

## 2019-05-12 NOTE — Progress Notes (Signed)
RN spoke with patient's mother, Barbee Shropshire, upon patient's request regarding plan of care. All questions and concerns answered. Patients mother updated on visitation hours.

## 2019-05-12 NOTE — Progress Notes (Signed)
Patient is tearful and states she is worried about procedure on Monday. RN spoke with patient regarding plan of care and stayed with patient for comfort. Patient requested to speak with heart failure team. Heart failure team was paged regarding patient's request.

## 2019-05-12 NOTE — Progress Notes (Signed)
Upon re-entering patient's room, patient states she felt like she was going to "black-out". Patient diaphoretic and tearful. Nurse and nurse tech helped patient to the bed, supplemental oxygen applied for comfort at 2 liters via nasal cannula. CBG taken, 152. Vitals were as follows:    05/12/19 1651  Vitals  Temp (!) 97.4 F (36.3 C)  Temp Source Oral  BP (!) 62/44  MAP (mmHg) (!) 50  BP Location Right Arm  BP Method Automatic  Patient Position (if appropriate) Sitting  Pulse Rate 90  Pulse Rate Source Monitor  Resp 20  Oxygen Therapy  SpO2 99 %  O2 Device Room Air  MEWS Score  MEWS RR 0  MEWS Pulse 0  MEWS Systolic 3  MEWS LOC 0  MEWS Temp 0  MEWS Score 3  MEWS Score Color Yellow   Weaver, PA paged. Patient placed in bed in trendelenburg position. Blood pressure then went to 128/78, heart 85, oxygen saturation 100%. Verbal order from [place], [place] to hold 18:00 scheduled dose of furosemide and 22:00 scheduled dose of Bidil.   Vitals are currently as follows:    05/12/19 1708  Vitals  BP 108/71  MAP (mmHg) 82  BP Method Automatic  Pulse Rate 94  Pulse Rate Source Monitor  Oxygen Therapy  SpO2 100 %  MEWS Score  MEWS RR 0  MEWS Pulse 0  MEWS Systolic 0  MEWS LOC 0  MEWS Temp 0  MEWS Score 0  MEWS Score Color Green   Patient currently lying in bed, speaking on the phone, bed lowest position, call bell within reach. Patient states she "feels a lot better".

## 2019-05-12 NOTE — Progress Notes (Signed)
Called to see patient for symptomatic hypotension.  The patient felt near syncopal with a blood pressure of 60/40.  She was placed in Trendelenburg position.  Her pressure improved to 671 systolic and then 245 systolic.  She felt much better with this.  Exam: No acute distress Cardiac: Regular rate and rhythm Lungs: Clear to auscultation bilaterally anteriorly Legs: No edema Abdomen: Soft  Plan: Hold evenin g dose of BiDil and furosemide. Continue to monitor. Richardson Dopp, PA-C 05/12/2019 18: 30

## 2019-05-12 NOTE — Progress Notes (Signed)
Phlebotomy currently in patient's room getting a STAT B.Met.

## 2019-05-13 LAB — BASIC METABOLIC PANEL
Anion gap: 14 (ref 5–15)
BUN: 14 mg/dL (ref 6–20)
CO2: 24 mmol/L (ref 22–32)
Calcium: 9.1 mg/dL (ref 8.9–10.3)
Chloride: 101 mmol/L (ref 98–111)
Creatinine, Ser: 0.89 mg/dL (ref 0.44–1.00)
GFR calc Af Amer: >60 mL/min (ref >60)
GFR calc non Af Amer: >60 mL/min (ref >60)
Glucose, Bld: 109 mg/dL — ABNORMAL HIGH (ref 70–99)
Potassium: 3.2 mmol/L — ABNORMAL LOW (ref 3.5–5.1)
Sodium: 139 mmol/L (ref 135–145)

## 2019-05-13 MED ORDER — ASPIRIN 81 MG PO CHEW
81.0000 mg | CHEWABLE_TABLET | ORAL | Status: AC
  Administered 2019-05-14: 81 mg via ORAL
  Filled 2019-05-13 (×2): qty 1

## 2019-05-13 MED ORDER — SODIUM CHLORIDE 0.9 % IV SOLN: 250.0000 mL | INTRAVENOUS | Status: DC | PRN

## 2019-05-13 MED ORDER — SODIUM CHLORIDE 0.9 % IV SOLN
INTRAVENOUS | Status: DC
  Administered 2019-05-14: 06:00:00 via INTRAVENOUS

## 2019-05-13 MED ORDER — SODIUM CHLORIDE 0.9% FLUSH: 3.0000 mL | INTRAVENOUS | Status: DC | PRN

## 2019-05-13 MED ORDER — POTASSIUM CHLORIDE CRYS ER 20 MEQ PO TBCR
40.0000 meq | EXTENDED_RELEASE_TABLET | Freq: Once | ORAL | Status: AC
  Administered 2019-05-13: 40 meq via ORAL
  Filled 2019-05-13: qty 2

## 2019-05-13 MED ORDER — SODIUM CHLORIDE 0.9% FLUSH
3.0000 mL | Freq: Two times a day (BID) | INTRAVENOUS | Status: DC
  Administered 2019-05-13 – 2019-05-14 (×2): 3 mL via INTRAVENOUS

## 2019-05-13 MED ORDER — SODIUM CHLORIDE 0.9 % IV SOLN: INTRAVENOUS | Status: DC

## 2019-05-13 MED ORDER — SODIUM CHLORIDE 0.9% FLUSH
3.0000 mL | Freq: Two times a day (BID) | INTRAVENOUS | Status: DC
  Administered 2019-05-14: 3 mL via INTRAVENOUS

## 2019-05-13 NOTE — Progress Notes (Signed)
Patient BP 109/80 HR 99 at 1030. MD is in the room. Told to hold the AM isosorbide.

## 2019-05-13 NOTE — H&P (View-Only) (Signed)
Patient ID: MONTEZ STRYKER, female   DOB: 03-02-1986, 33 y.o.   MRN: [id number]     Advanced Heart Failure Rounding Note  PCP-Cardiologist: No primary care provider on file.   Subjective:    BP dropped yesterday after getting her meds all together.  BP stable this morning.  No dyspnea at rest, decreased cough.  Nervous about cath.    Objective:   Weight Range: 54.3 kg Body mass index is 17.69 kg/m.   Vital Signs:   Temp:  [97.4 F (36.3 C)-98.4 F (36.9 C)] 98.1 F (36.7 C) (11/15 1031) Pulse Rate:  [85-109] 99 (11/15 1031) Resp:  [16-20] 18 (11/15 1031) BP: (62-136)/(44-107) 109/80 (11/15 1031) SpO2:  [98 %-100 %] 100 % (11/15 1031) Weight:  [54.1 kg-54.3 kg] 54.3 kg (11/15 0427) Last BM Date: 05/11/19  Weight change: Filed Weights   05/12/19 0500 05/12/19 1247 05/13/19 0427  Weight: 55.2 kg 54.1 kg 54.3 kg    Intake/Output:   Intake/Output Summary (Last 24 hours) at 05/13/2019 1037 Last data filed at 05/13/2019 0800 Gross per 24 hour  Intake 600 ml  Output 550 ml  Net 50 ml      Physical Exam    General:  Well appearing. No resp difficulty HEENT: Normal Neck: Supple. JVP not elevated. Carotids 2+ bilat; no bruits. No lymphadenopathy or thyromegaly appreciated. Cor: PMI nondisplaced. Mildly tachy, regular rate & rhythm. No rubs, gallops or murmurs. Lungs: Clear Abdomen: Soft, nontender, nondistended. No hepatosplenomegaly. No bruits or masses. Good bowel sounds. Extremities: No cyanosis, clubbing, rash, edema Neuro: Alert & orientedx3, cranial nerves grossly intact. moves all 4 extremities w/o difficulty. Affect pleasant   Telemetry   Sinus tachy 100s, personally reviewed   Labs    CBC No results for input(s): WBC, NEUTROABS, HGB, HCT, MCV, PLT in the last 72 hours. Basic Metabolic Panel Recent Labs    05/12/19 1207 05/13/19 0403  NA 138 139  K 3.2* 3.2*  CL 101 101  CO2 21* 24  GLUCOSE 109* 109*  BUN 12 14  CREATININE 0.99 0.89   CALCIUM 9.0 9.1   Liver Function Tests Recent Labs    05/11/19 0441  AST 42*  ALT 25  ALKPHOS 72  BILITOT 2.6*  PROT 6.4*  ALBUMIN 3.7   No results for input(s): LIPASE, AMYLASE in the last 72 hours. Cardiac Enzymes No results for input(s): CKTOTAL, CKMB, CKMBINDEX, TROPONINI in the last 72 hours.  BNP: BNP (last 3 results) Recent Labs    05/10/19 1007  BNP 810.0*    ProBNP (last 3 results) No results for input(s): PROBNP in the last 8760 hours.   D-Dimer No results for input(s): DDIMER in the last 72 hours. Hemoglobin A1C Recent Labs    05/11/19 0441  HGBA1C 5.4   Fasting Lipid Panel No results for input(s): CHOL, HDL, LDLCALC, TRIG, CHOLHDL, LDLDIRECT in the last 72 hours. Thyroid Function Tests No results for input(s): TSH, T4TOTAL, T3FREE, THYROIDAB in the last 72 hours.  Invalid input(s): FREET3  Other results:   Imaging     No results found.   Medications:     Scheduled Medications: . enoxaparin (LOVENOX) injection  40 mg Subcutaneous Q24H  . furosemide  40 mg Intravenous Q12H  . ivabradine  5 mg Oral BID WC  . losartan  25 mg Oral Daily  . potassium chloride  20 mEq Oral BID  . spironolactone  12.5 mg Oral Daily     Infusions:   PRN Medications:  acetaminophen **  OR** acetaminophen, polyethylene glycol, zolpidem   Assessment/Plan   1. Acute systolic CHF: Cardiomyopathy of uncertain etiology.  She has been symptomatic for about 3 months.  No FH cardiomyopathy, negative HIV and normal TSH, no recent pregnancy.  She has been drinking heavily for the last several months, ETOH may contribute to her cardiomyopathy.  Uncontrolled HTN may play a role.  Cannot rule out viral myocarditis as initiating event.  CAD is possible but unlikely, no chest pain.  Episode of hypotension yesterday when she got all her meds at once, BP ok this morning.  HR around 100 on ivabradine.  She does not look volume overloaded.  - She has had a dose of IV Lasix  this morning, will hold Lasix for now and dose based on tomorrow's RHC.  - Continue losartan 25 mg daily.  She was on lisinopril prior to admission.  If BP stabilizes, may transition to [facility].  - Stop Bidil for now with episode of hypotension yesterday.  - Spironolactone 12.5 daily.  - Corlanor 5 mg bid.  - I will plan RHC/LHC on Monday to assess filling pressures and cardiac output, also will do angiography to rule out CAD.  I have discussed risks/benefits with patient and she agrees.  - If no CAD, will arrange for cardiac MRI to look for infiltrative disease/myocarditis.  2. HTN: Controlling with meds as above.  3. ETOH abuse: Drinking fairly heavily prior to admit, discussed stopping.   Walk in halls.   Length of Stay: 3  Marca Ancona, MD  05/13/2019, 10:37 AM  Advanced Heart Failure Team Pager [phone] (M-F; 7a - 4p)  Please contact [facility] for night-coverage after hours (4p -7a ) and weekends on [email or web address]

## 2019-05-13 NOTE — Progress Notes (Signed)
Patient ID: Tina Fletcher, female   DOB: 03-02-1986, 33 y.o.   MRN: [id number]     Advanced Heart Failure Rounding Note  PCP-Cardiologist: No primary care provider on file.   Subjective:    BP dropped yesterday after getting her meds all together.  BP stable this morning.  No dyspnea at rest, decreased cough.  Nervous about cath.    Objective:   Weight Range: 54.3 kg Body mass index is 17.69 kg/m.   Vital Signs:   Temp:  [97.4 F (36.3 C)-98.4 F (36.9 C)] 98.1 F (36.7 C) (11/15 1031) Pulse Rate:  [85-109] 99 (11/15 1031) Resp:  [16-20] 18 (11/15 1031) BP: (62-136)/(44-107) 109/80 (11/15 1031) SpO2:  [98 %-100 %] 100 % (11/15 1031) Weight:  [54.1 kg-54.3 kg] 54.3 kg (11/15 0427) Last BM Date: 05/11/19  Weight change: Filed Weights   05/12/19 0500 05/12/19 1247 05/13/19 0427  Weight: 55.2 kg 54.1 kg 54.3 kg    Intake/Output:   Intake/Output Summary (Last 24 hours) at 05/13/2019 1037 Last data filed at 05/13/2019 0800 Gross per 24 hour  Intake 600 ml  Output 550 ml  Net 50 ml      Physical Exam    General:  Well appearing. No resp difficulty HEENT: Normal Neck: Supple. JVP not elevated. Carotids 2+ bilat; no bruits. No lymphadenopathy or thyromegaly appreciated. Cor: PMI nondisplaced. Mildly tachy, regular rate & rhythm. No rubs, gallops or murmurs. Lungs: Clear Abdomen: Soft, nontender, nondistended. No hepatosplenomegaly. No bruits or masses. Good bowel sounds. Extremities: No cyanosis, clubbing, rash, edema Neuro: Alert & orientedx3, cranial nerves grossly intact. moves all 4 extremities w/o difficulty. Affect pleasant   Telemetry   Sinus tachy 100s, personally reviewed   Labs    CBC No results for input(s): WBC, NEUTROABS, HGB, HCT, MCV, PLT in the last 72 hours. Basic Metabolic Panel Recent Labs    05/12/19 1207 05/13/19 0403  NA 138 139  K 3.2* 3.2*  CL 101 101  CO2 21* 24  GLUCOSE 109* 109*  BUN 12 14  CREATININE 0.99 0.89   CALCIUM 9.0 9.1   Liver Function Tests Recent Labs    05/11/19 0441  AST 42*  ALT 25  ALKPHOS 72  BILITOT 2.6*  PROT 6.4*  ALBUMIN 3.7   No results for input(s): LIPASE, AMYLASE in the last 72 hours. Cardiac Enzymes No results for input(s): CKTOTAL, CKMB, CKMBINDEX, TROPONINI in the last 72 hours.  BNP: BNP (last 3 results) Recent Labs    05/10/19 1007  BNP 810.0*    ProBNP (last 3 results) No results for input(s): PROBNP in the last 8760 hours.   D-Dimer No results for input(s): DDIMER in the last 72 hours. Hemoglobin A1C Recent Labs    05/11/19 0441  HGBA1C 5.4   Fasting Lipid Panel No results for input(s): CHOL, HDL, LDLCALC, TRIG, CHOLHDL, LDLDIRECT in the last 72 hours. Thyroid Function Tests No results for input(s): TSH, T4TOTAL, T3FREE, THYROIDAB in the last 72 hours.  Invalid input(s): FREET3  Other results:   Imaging     No results found.   Medications:     Scheduled Medications: . enoxaparin (LOVENOX) injection  40 mg Subcutaneous Q24H  . furosemide  40 mg Intravenous Q12H  . ivabradine  5 mg Oral BID WC  . losartan  25 mg Oral Daily  . potassium chloride  20 mEq Oral BID  . spironolactone  12.5 mg Oral Daily     Infusions:   PRN Medications:  acetaminophen **  OR** acetaminophen, polyethylene glycol, zolpidem   Assessment/Plan   1. Acute systolic CHF: Cardiomyopathy of uncertain etiology.  She has been symptomatic for about 3 months.  No FH cardiomyopathy, negative HIV and normal TSH, no recent pregnancy.  She has been drinking heavily for the last several months, ETOH may contribute to her cardiomyopathy.  Uncontrolled HTN may play a role.  Cannot rule out viral myocarditis as initiating event.  CAD is possible but unlikely, no chest pain.  Episode of hypotension yesterday when she got all her meds at once, BP ok this morning.  HR around 100 on ivabradine.  She does not look volume overloaded.  - She has had a dose of IV Lasix  this morning, will hold Lasix for now and dose based on tomorrow's RHC.  - Continue losartan 25 mg daily.  She was on lisinopril prior to admission.  If BP stabilizes, may transition to [facility].  - Stop Bidil for now with episode of hypotension yesterday.  - Spironolactone 12.5 daily.  - Corlanor 5 mg bid.  - I will plan RHC/LHC on Monday to assess filling pressures and cardiac output, also will do angiography to rule out CAD.  I have discussed risks/benefits with patient and she agrees.  - If no CAD, will arrange for cardiac MRI to look for infiltrative disease/myocarditis.  2. HTN: Controlling with meds as above.  3. ETOH abuse: Drinking fairly heavily prior to admit, discussed stopping.   Walk in halls.   Length of Stay: 3  Marca Ancona, MD  05/13/2019, 10:37 AM  Advanced Heart Failure Team Pager [phone] (M-F; 7a - 4p)  Please contact [facility] for night-coverage after hours (4p -7a ) and weekends on [email or web address]

## 2019-05-13 NOTE — Plan of Care (Signed)
  Problem: Education: Goal: Knowledge of General Education information will improve Description: Including pain rating scale, medication(s)/side effects and non-pharmacologic comfort measures Outcome: Progressing   Problem: Clinical Measurements: Goal: Ability to maintain clinical measurements within normal limits will improve Outcome: Progressing Goal: Will remain free from infection Outcome: Progressing   

## 2019-05-14 ENCOUNTER — Inpatient Hospital Stay (HOSPITAL_COMMUNITY): Payer: Medicaid Other

## 2019-05-14 ENCOUNTER — Telehealth (HOSPITAL_COMMUNITY): Payer: Self-pay | Admitting: Pharmacist

## 2019-05-14 ENCOUNTER — Encounter (HOSPITAL_COMMUNITY): Payer: Self-pay | Admitting: Cardiology

## 2019-05-14 ENCOUNTER — Encounter (HOSPITAL_COMMUNITY)
Admission: EM | Disposition: A | Discharge status: Home / Self Care | Payer: Self-pay | Source: Home / Self Care | Attending: Cardiology

## 2019-05-14 DIAGNOSIS — I5021 Acute systolic (congestive) heart failure: Secondary | ICD-10-CM

## 2019-05-14 DIAGNOSIS — I429 Cardiomyopathy, unspecified: Secondary | ICD-10-CM

## 2019-05-14 DIAGNOSIS — F1010 Alcohol abuse, uncomplicated: Secondary | ICD-10-CM

## 2019-05-14 DIAGNOSIS — I10 Essential (primary) hypertension: Secondary | ICD-10-CM

## 2019-05-14 HISTORY — PX: RIGHT/LEFT HEART CATH AND CORONARY ANGIOGRAPHY: CATH118266

## 2019-05-14 LAB — BASIC METABOLIC PANEL
Anion gap: 15 (ref 5–15)
BUN: 14 mg/dL (ref 6–20)
CO2: 22 mmol/L (ref 22–32)
Calcium: 9.3 mg/dL (ref 8.9–10.3)
Chloride: 101 mmol/L (ref 98–111)
Creatinine, Ser: 0.93 mg/dL (ref 0.44–1.00)
GFR calc Af Amer: >60 mL/min (ref >60)
GFR calc non Af Amer: >60 mL/min (ref >60)
Glucose, Bld: 109 mg/dL — ABNORMAL HIGH (ref 70–99)
Potassium: 3.6 mmol/L (ref 3.5–5.1)
Sodium: 138 mmol/L (ref 135–145)

## 2019-05-14 LAB — CBC WITH DIFFERENTIAL/PLATELET
Abs Immature Granulocytes: 0.02 10*3/uL (ref 0.00–0.07)
Basophils Absolute: 0 10*3/uL (ref 0.0–0.1)
Basophils Relative: 1 %
Eosinophils Absolute: 0.1 10*3/uL (ref 0.0–0.5)
Eosinophils Relative: 2 %
HCT: 42.9 % (ref 36.0–46.0)
Hemoglobin: 13.8 g/dL (ref 12.0–15.0)
Immature Granulocytes: 0 %
Lymphocytes Relative: 32 %
Lymphs Abs: 1.6 10*3/uL (ref 0.7–4.0)
MCH: 28.3 pg (ref 26.0–34.0)
MCHC: 32.2 g/dL (ref 30.0–36.0)
MCV: 87.9 fL (ref 80.0–100.0)
Monocytes Absolute: 0.7 10*3/uL (ref 0.1–1.0)
Monocytes Relative: 13 %
Neutro Abs: 2.5 10*3/uL (ref 1.7–7.7)
Neutrophils Relative %: 52 %
Platelets: 249 10*3/uL (ref 150–400)
RBC: 4.88 MIL/uL (ref 3.87–5.11)
RDW: 15.1 % (ref 11.5–15.5)
WBC: 4.9 10*3/uL (ref 4.0–10.5)
nRBC: 0 % (ref 0.0–0.2)

## 2019-05-14 LAB — POCT I-STAT EG7
Acid-base deficit: 1 mmol/L (ref 0.0–2.0)
Acid-base deficit: 2 mmol/L (ref 0.0–2.0)
Bicarbonate: 23 mmol/L (ref 20.0–28.0)
Bicarbonate: 23.6 mmol/L (ref 20.0–28.0)
Calcium, Ion: 1.19 mmol/L (ref 1.15–1.40)
Calcium, Ion: 1.26 mmol/L (ref 1.15–1.40)
HCT: 41 % (ref 36.0–46.0)
HCT: 42 % (ref 36.0–46.0)
Hemoglobin: 13.9 g/dL (ref 12.0–15.0)
Hemoglobin: 14.3 g/dL (ref 12.0–15.0)
O2 Saturation: 80 %
O2 Saturation: 80 %
Potassium: 3.7 mmol/L (ref 3.5–5.1)
Potassium: 3.8 mmol/L (ref 3.5–5.1)
Sodium: 138 mmol/L (ref 135–145)
Sodium: 138 mmol/L (ref 135–145)
TCO2: 24 mmol/L (ref 22–32)
TCO2: 25 mmol/L (ref 22–32)
pCO2, Ven: 37.9 mmHg — ABNORMAL LOW (ref 44.0–60.0)
pCO2, Ven: 39 mmHg — ABNORMAL LOW (ref 44.0–60.0)
pH, Ven: 7.39 (ref 7.250–7.430)
pH, Ven: 7.391 (ref 7.250–7.430)
pO2, Ven: 45 mmHg (ref 32.0–45.0)
pO2, Ven: 45 mmHg (ref 32.0–45.0)

## 2019-05-14 SURGERY — RIGHT/LEFT HEART CATH AND CORONARY ANGIOGRAPHY
Anesthesia: LOCAL

## 2019-05-14 MED ORDER — HEPARIN SODIUM (PORCINE) 1000 UNIT/ML IJ SOLN
INTRAMUSCULAR | Status: AC
  Filled 2019-05-14: qty 1

## 2019-05-14 MED ORDER — HEPARIN SODIUM (PORCINE) 1000 UNIT/ML IJ SOLN
INTRAMUSCULAR | Status: DC | PRN
  Administered 2019-05-14: 3000 [IU] via INTRAVENOUS

## 2019-05-14 MED ORDER — VERAPAMIL HCL 2.5 MG/ML IV SOLN
INTRAVENOUS | Status: DC | PRN
  Administered 2019-05-14: 10 mL via INTRA_ARTERIAL

## 2019-05-14 MED ORDER — VERAPAMIL HCL 2.5 MG/ML IV SOLN
INTRAVENOUS | Status: AC
  Filled 2019-05-14: qty 2

## 2019-05-14 MED ORDER — IOHEXOL 350 MG/ML SOLN
INTRAVENOUS | Status: DC | PRN
  Administered 2019-05-14: 60 mL

## 2019-05-14 MED ORDER — FENTANYL CITRATE (PF) 100 MCG/2ML IJ SOLN
INTRAMUSCULAR | Status: DC | PRN
  Administered 2019-05-14 (×2): 25 ug via INTRAVENOUS

## 2019-05-14 MED ORDER — ASPIRIN 81 MG PO CHEW: 81.0000 mg | CHEWABLE_TABLET | ORAL | Status: DC

## 2019-05-14 MED ORDER — LIDOCAINE HCL (PF) 1 % IJ SOLN
INTRAMUSCULAR | Status: AC
  Filled 2019-05-14: qty 30

## 2019-05-14 MED ORDER — HEPARIN (PORCINE) IN NACL 1000-0.9 UT/500ML-% IV SOLN
INTRAVENOUS | Status: DC | PRN
  Administered 2019-05-14 (×2): 500 mL

## 2019-05-14 MED ORDER — SODIUM CHLORIDE 0.9% FLUSH: 3.0000 mL | Freq: Two times a day (BID) | INTRAVENOUS | Status: DC

## 2019-05-14 MED ORDER — IVABRADINE HCL 5 MG PO TABS: 5.0000 mg | ORAL_TABLET | Freq: Two times a day (BID) | ORAL | 5 refills | Status: DC

## 2019-05-14 MED ORDER — LIDOCAINE HCL (PF) 1 % IJ SOLN
INTRAMUSCULAR | Status: DC | PRN
  Administered 2019-05-14: 2 mL

## 2019-05-14 MED ORDER — SODIUM CHLORIDE 0.9 % IV SOLN: 250.0000 mL | INTRAVENOUS | Status: DC | PRN

## 2019-05-14 MED ORDER — HEPARIN (PORCINE) IN NACL 1000-0.9 UT/500ML-% IV SOLN
INTRAVENOUS | Status: AC
  Filled 2019-05-14: qty 1000

## 2019-05-14 MED ORDER — FENTANYL CITRATE (PF) 100 MCG/2ML IJ SOLN
INTRAMUSCULAR | Status: AC
  Filled 2019-05-14: qty 2

## 2019-05-14 MED ORDER — SACUBITRIL-VALSARTAN 24-26 MG PO TABS
1.0000 | ORAL_TABLET | Freq: Two times a day (BID) | ORAL | Status: DC
  Administered 2019-05-14: 1 via ORAL
  Filled 2019-05-14: qty 1

## 2019-05-14 MED ORDER — SODIUM CHLORIDE 0.9% FLUSH: 3.0000 mL | INTRAVENOUS | Status: DC | PRN

## 2019-05-14 MED ORDER — CARVEDILOL 3.125 MG PO TABS
3.1250 mg | ORAL_TABLET | Freq: Two times a day (BID) | ORAL | Status: DC
  Administered 2019-05-14: 3.125 mg via ORAL
  Filled 2019-05-14: qty 1

## 2019-05-14 MED ORDER — MIDAZOLAM HCL 2 MG/2ML IJ SOLN
INTRAMUSCULAR | Status: AC
  Filled 2019-05-14: qty 2

## 2019-05-14 MED ORDER — HYDRALAZINE HCL 20 MG/ML IJ SOLN: 10.0000 mg | INTRAMUSCULAR | Status: AC | PRN

## 2019-05-14 MED ORDER — POTASSIUM CHLORIDE CRYS ER 20 MEQ PO TBCR
20.0000 meq | EXTENDED_RELEASE_TABLET | Freq: Once | ORAL | Status: AC
  Administered 2019-05-14: 20 meq via ORAL
  Filled 2019-05-14: qty 1

## 2019-05-14 MED ORDER — MIDAZOLAM HCL 2 MG/2ML IJ SOLN
INTRAMUSCULAR | Status: DC | PRN
  Administered 2019-05-14 (×2): 1 mg via INTRAVENOUS

## 2019-05-14 MED ORDER — SPIRONOLACTONE 25 MG PO TABS: 12.5000 mg | ORAL_TABLET | Freq: Every day | ORAL | 5 refills | Status: DC

## 2019-05-14 MED ORDER — GADOBUTROL 1 MMOL/ML IV SOLN
8.0000 mL | Freq: Once | INTRAVENOUS | Status: AC | PRN
  Administered 2019-05-14: 8 mL via INTRAVENOUS

## 2019-05-14 MED ORDER — ONDANSETRON HCL 4 MG/2ML IJ SOLN: 4.0000 mg | Freq: Four times a day (QID) | INTRAMUSCULAR | Status: DC | PRN

## 2019-05-14 MED ORDER — ENOXAPARIN SODIUM 40 MG/0.4ML ~~LOC~~ SOLN: 40.0000 mg | SUBCUTANEOUS | Status: DC

## 2019-05-14 MED ORDER — LABETALOL HCL 5 MG/ML IV SOLN: 10.0000 mg | INTRAVENOUS | Status: AC | PRN

## 2019-05-14 MED ORDER — ACETAMINOPHEN 325 MG PO TABS: 650.0000 mg | ORAL_TABLET | ORAL | Status: DC | PRN

## 2019-05-14 MED ORDER — CARVEDILOL 3.125 MG PO TABS: 3.1250 mg | ORAL_TABLET | Freq: Two times a day (BID) | ORAL | 5 refills | Status: DC

## 2019-05-14 MED ORDER — SACUBITRIL-VALSARTAN 24-26 MG PO TABS: 1.0000 | ORAL_TABLET | Freq: Two times a day (BID) | ORAL | 5 refills | Status: DC

## 2019-05-14 MED ORDER — SODIUM CHLORIDE 0.9 % IV SOLN: INTRAVENOUS | Status: AC

## 2019-05-14 SURGICAL SUPPLY — 12 items
CATH 5FR JL3.5 JR4 ANG PIG MP (CATHETERS) ×2 IMPLANT
CATH BALLN WEDGE 5F 110CM (CATHETERS) ×2 IMPLANT
DEVICE RAD TR BAND REGULAR (VASCULAR PRODUCTS) ×2 IMPLANT
GLIDESHEATH SLEND SS 6F .021 (SHEATH) ×2 IMPLANT
GUIDEWIRE INQWIRE 1.5J.035X260 (WIRE) ×1 IMPLANT
INQWIRE 1.5J .035X260CM (WIRE) ×2
KIT HEART LEFT (KITS) ×2 IMPLANT
PACK CARDIAC CATHETERIZATION (CUSTOM PROCEDURE TRAY) ×2 IMPLANT
SHEATH GLIDE SLENDER 4/5FR (SHEATH) ×2 IMPLANT
SHEATH PROBE COVER 6X72 (BAG) ×2 IMPLANT
TRANSDUCER W/STOPCOCK (MISCELLANEOUS) ×2 IMPLANT
WIRE EMERALD 3MM-J .025X260CM (WIRE) ×2 IMPLANT

## 2019-05-14 NOTE — Progress Notes (Signed)
Chaplain engaged in initial visit with Tina Fletcher, responding to consult for an Advanced Directive.  Tina Fletcher did not want an AD.  Chaplain let her know to contact her if she had any other needs pertaining to caring for her spirit.  Chaplain will follow-up as needed.

## 2019-05-14 NOTE — Discharge Summary (Signed)
Advanced Heart Failure Team  Discharge Summary   Patient ID: Tina Fletcher MRN: [id number], DOB/AGE: December 27, 1985 33 y.o. Admit date: 05/10/2019 D/C date:     05/14/2019   Primary Discharge Diagnoses:  Acute Systolic Congestive Heart Failure  Secondary Discharge Diagnoses:  Hypertension  Hospital Course:  33yo female with PMH HTN who presented to [facility] with cough, SOB, LE swelling, orthopnea, bendopnea and 20lb weight gain over 3 months. She has a history of increased alcohol use over the past several months of 7-8 airplane bottles of liquor 3-4 days per week. BNP was elevated and CTA showed pulmonary edema. ECHO showed new onset systolic HF with EF 15% and decreased RV systolic function. Cardiology was consulted and she was transferred to [facility]  While admitted she was diuresed with IV lasix. Right and Left heart cath were done. LHC showed no significant coronary artery disease and RHC was within normal limits. Her cardiomyopathy is possibly secondary to recent increased alcohol use or uncontrolled HTN. Viral myocarditis also a possibility with recent cough. She has no family history, negative HIV, and TSH was normal. cMRi was performed prior to discharge. Dr. Shirlee Latch to follow-up on result.   On 11/16, pt was seen and examined by Dr. Shirlee Latch and felt stable for discharge home.   She will be discharged with entresto 24-26 bid, coreg 3.125 mg bid, spironolactone 12.5 mg qd, and ivabradine 5 mg bid. Pt will been seen in [facility] for post hospital f/u.     Discharge Weight Range: 117-118lbs Discharge Vitals: Blood pressure 114/76, pulse 93, temperature 98.5 F (36.9 C), temperature source Oral, resp. rate 18, height 5\' 9"  (1.753 m), weight 53.6 kg, SpO2 100 %.  Labs: Lab Results  Component Value Date   WBC 4.9 05/14/2019   HGB 14.3 05/14/2019   HGB 13.9 05/14/2019   HCT 42.0 05/14/2019   HCT 41.0 05/14/2019   MCV 87.9 05/14/2019   PLT 249 05/14/2019    Recent Labs  Lab  05/11/19 0441  05/14/19 0556 05/14/19 0821  NA 139   < > 138 138  138  K 3.9   < > 3.6 3.7  3.8  CL 107   < > 101  --   CO2 20*   < > 22  --   BUN 15   < > 14  --   CREATININE 0.75   < > 0.93  --   CALCIUM 8.5*   < > 9.3  --   PROT 6.4*  --   --   --   BILITOT 2.6*  --   --   --   ALKPHOS 72  --   --   --   ALT 25  --   --   --   AST 42*  --   --   --   GLUCOSE 93   < > 109*  --    < > = values in this interval not displayed.   Lab Results  Component Value Date   CHOL 180 03/19/2016   HDL 80 03/19/2016   LDLCALC 68 03/19/2016   TRIG 158 (H) 03/19/2016   BNP (last 3 results) Recent Labs    05/10/19 1007  BNP 810.0*    ProBNP (last 3 results) No results for input(s): PROBNP in the last 8760 hours.   Diagnostic Studies/Procedures   Left Heart Cath 05/14/2019 Left Main  No significant coronary disease.  Left Anterior Descending  No significant coronary disease.  Ramus Intermedius  Moderate  vessel, no significant coronary disease.  Left Circumflex  Relatively small, no significant coronary disease.  Right Coronary Artery  No significant coronary disease.     Right Heart Cath 05/14/2019 Right Heart Pressures RHC Procedural Findings: Hemodynamics (mmHg) RA mean 1 RV 23/1 PA 24/8, mean 14 PCWP mean 5 LV 119/6 AO 122/82  Oxygen saturations: PA 80% AO 100%  Cardiac Output (Fick) 5.87  Cardiac Index (Fick) 3.54     Echocardiogram: 05/10/2019 IMPRESSIONS   1. Left ventricular ejection fraction, by visual estimation, is approximately 15%. The left ventricle has severely decreased function. There is borderline left ventricular hypertrophy. Prominent posterolateral trabeculation. Transverse false tendon in  mid LV (normal variant). 2. Elevated left ventricular end-diastolic pressure. 3. Left ventricular diastolic parameters are indeterminate. 4. The left ventricle demonstrates global hypokinesis. 5. Global right ventricle shows reduced  contraction.The right ventricular size is normal. No increase in right ventricular wall thickness. 6. Left atrial size was moderately dilated. 7. Right atrial size was normal. 8. Trivial pericardial effusion is present. 9. The pericardial effusion is posterior to the left ventricle and anterior to the right ventricle. 10. The mitral valve is grossly normal. Mild mitral valve regurgitation. 11. The tricuspid valve is grossly normal. Tricuspid valve regurgitation is mild. 12. The aortic valve is tricuspid. Aortic valve regurgitation is not visualized. 13. The pulmonic valve was grossly normal. Pulmonic valve regurgitation is trivial. 14. The inferior vena cava is dilated in size with >50% respiratory variability, suggesting right atrial pressure of 8 mmHg. 15. TR signal is inadequate for assessing pulmonary artery systolic pressure.  cMRI 05/14/19 - result pending   Discharge Medications   Allergies as of 05/14/2019   No Known Allergies     Medication List    STOP taking these medications   lisinopril 20 MG tablet Commonly known as: ZESTRIL   magic mouthwash w/lidocaine Soln   megestrol 40 MG tablet Commonly known as: MEGACE     TAKE these medications   acetaminophen 325 MG tablet Commonly known as: TYLENOL Take 650 mg by mouth every 6 (six) hours as needed.   carvedilol 3.125 MG tablet Commonly known as: COREG Take 1 tablet (3.125 mg total) by mouth 2 (two) times daily with a meal.   ivabradine 5 MG Tabs tablet Commonly known as: CORLANOR Take 1 tablet (5 mg total) by mouth 2 (two) times daily with a meal. Start taking on: May 15, 2019   Nexplanon 68 MG Impl implant Generic drug: etonogestrel Inject 1 each into the skin once.   sacubitril-valsartan 24-26 MG Commonly known as: ENTRESTO Take 1 tablet by mouth 2 (two) times daily.   spironolactone 25 MG tablet Commonly known as: ALDACTONE Take 0.5 tablets (12.5 mg total) by mouth daily. Start taking on:  May 15, 2019   traMADol 50 MG tablet Commonly known as: ULTRAM Take by mouth every 6 (six) hours as needed.       Disposition   The patient will be discharged in stable condition to home.  Follow-up Information    Larey Dresser, MD Follow up.   Specialty: Cardiology Why: our office will call you with a hospital follow-up appointment  Contact information: [address] [place] [address] [phone]             Duration of Discharge Encounter: Greater than 35 minutes   Signed, Brittainy SimmonsPA-C 05/14/2019, 5:31 PM

## 2019-05-14 NOTE — Plan of Care (Signed)

## 2019-05-14 NOTE — Progress Notes (Signed)
Patient ID: Tina Fletcher, female   DOB: 12/22/1985, 33 y.o.   MRN: [id number]     Advanced Heart Failure Rounding Note  PCP-Cardiologist: No primary care provider on file.   Subjective:    BP stable yesterday, coughing has stopped.  Feels back to normal now. HR 90s on ivabradine.   RHC/LHC:   Coronary Findings  Diagnostic Dominance: Right Left Main  No significant coronary disease.  Left Anterior Descending  No significant coronary disease.  Ramus Intermedius  Moderate vessel, no significant coronary disease.  Left Circumflex  Relatively small, no significant coronary disease.  Right Coronary Artery  No significant coronary disease.  Intervention  No interventions have been documented. Right Heart  Right Heart Pressures RHC Procedural Findings: Hemodynamics (mmHg) RA mean 1 RV 23/1 PA 24/8, mean 14 PCWP mean 5 LV 119/6 AO 122/82  Oxygen saturations: PA 80% AO 100%  Cardiac Output (Fick) 5.87  Cardiac Index (Fick) 3.54      Objective:   Weight Range: 53.6 kg Body mass index is 17.44 kg/m.   Vital Signs:   Temp:  [97.9 F (36.6 C)-98.7 F (37.1 C)] 97.9 F (36.6 C) (11/16 0429) Pulse Rate:  [89-101] 89 (11/16 0902) Resp:  [16-20] 16 (11/16 0429) BP: (109-133)/(80-90) 109/83 (11/16 0902) SpO2:  [99 %-100 %] 99 % (11/16 0902) Weight:  [53.6 kg] 53.6 kg (11/16 0429) Last BM Date: 05/12/19  Weight change: Filed Weights   05/12/19 1247 05/13/19 0427 05/14/19 0429  Weight: 54.1 kg 54.3 kg 53.6 kg    Intake/Output:   Intake/Output Summary (Last 24 hours) at 05/14/2019 0905 Last data filed at 05/14/2019 0648 Gross per 24 hour  Intake 362.8 ml  Output 550 ml  Net -187.2 ml      Physical Exam    General: NAD Neck: No JVD, no thyromegaly or thyroid nodule.  Lungs: Clear to auscultation bilaterally with normal respiratory effort. CV: Nondisplaced PMI.  Heart regular S1/S2, no S3/S4, no murmur.  No peripheral edema.  Abdomen: Soft,  nontender, no hepatosplenomegaly, no distention.  Skin: Intact without lesions or rashes.  Neurologic: Alert and oriented x 3.  Psych: Normal affect. Extremities: No clubbing or cyanosis.  HEENT: Normal.    Telemetry   NSR 90s, personally reviewed   Labs    CBC Recent Labs    05/14/19 0556  WBC 4.9  NEUTROABS 2.5  HGB 13.8  HCT 42.9  MCV 87.9  PLT 249   Basic Metabolic Panel Recent Labs    40/81/44 0403 05/14/19 0556  NA 139 138  K 3.2* 3.6  CL 101 101  CO2 24 22  GLUCOSE 109* 109*  BUN 14 14  CREATININE 0.89 0.93  CALCIUM 9.1 9.3   Liver Function Tests No results for input(s): AST, ALT, ALKPHOS, BILITOT, PROT, ALBUMIN in the last 72 hours. No results for input(s): LIPASE, AMYLASE in the last 72 hours. Cardiac Enzymes No results for input(s): CKTOTAL, CKMB, CKMBINDEX, TROPONINI in the last 72 hours.  BNP: BNP (last 3 results) Recent Labs    05/10/19 1007  BNP 810.0*    ProBNP (last 3 results) No results for input(s): PROBNP in the last 8760 hours.   D-Dimer No results for input(s): DDIMER in the last 72 hours. Hemoglobin A1C No results for input(s): HGBA1C in the last 72 hours. Fasting Lipid Panel No results for input(s): CHOL, HDL, LDLCALC, TRIG, CHOLHDL, LDLDIRECT in the last 72 hours. Thyroid Function Tests No results for input(s): TSH, T4TOTAL, T3FREE, THYROIDAB in  the last 72 hours.  Invalid input(s): FREET3  Other results:   Imaging    No results found.   Medications:     Scheduled Medications: . enoxaparin (LOVENOX) injection  40 mg Subcutaneous Q24H  . ivabradine  5 mg Oral BID WC  . potassium chloride  20 mEq Oral BID  . sacubitril-valsartan  1 tablet Oral BID  . sodium chloride flush  3 mL Intravenous Q12H  . sodium chloride flush  3 mL Intravenous Q12H  . spironolactone  12.5 mg Oral Daily    Infusions:   PRN Medications: acetaminophen **OR** acetaminophen, polyethylene glycol, zolpidem   Assessment/Plan    1. Acute systolic CHF: Cardiomyopathy of uncertain etiology.  She has been symptomatic for about 3 months.  No FH cardiomyopathy, negative HIV and normal TSH, no recent pregnancy.  She has been drinking heavily for the last several months, ETOH may contribute to her cardiomyopathy.  Uncontrolled HTN may play a role.  Cannot rule out viral myocarditis as initiating event. Cath today with no CAD, filling pressures optimized and preserved cardiac output.    - Stop losartan today, start Entresto 24/26 bid.  - I think we can hold off on Lasix for now with low filling pressures, she will get some diuresis with Entresto. - Coreg 3.125 mg bid.  - Spironolactone 12.5 daily.  - Corlanor 5 mg bid.  - I will arrange for cardiac MRI to look for infiltrative disease/myocarditis.  2. HTN: Controlling with meds as above.  3. ETOH abuse: Drinking fairly heavily prior to admit, discussed stopping.  4. Disposition: Home today after cardiac MRI (make sure she tolerates Entresto).  Needs close followup in CHF clinic. Meds for home: Entresto 24/26 bid, Coreg 3.125 mg bid, ivabradine 5 mg bid, spironolactone 12.5 mg daily.   Length of Stay: 4  Loralie Champagne, MD  05/14/2019, 9:05 AM  Advanced Heart Failure Team Pager [phone] (M-F; 7a - 4p)  Please contact [facility] for night-coverage after hours (4p -7a ) and weekends on [email or web address]

## 2019-05-14 NOTE — Interval H&P Note (Signed)
History and Physical Interval Note:  05/14/2019 7:57 AM  Tina Fletcher  has presented today for surgery, with the diagnosis of congestive heart failure.  The various methods of treatment have been discussed with the patient and family. After consideration of risks, benefits and other options for treatment, the patient has consented to  Procedure(s): RIGHT/LEFT HEART CATH AND CORONARY ANGIOGRAPHY (N/A) as a surgical intervention.  The patient's history has been reviewed, patient examined, no change in status, stable for surgery.  I have reviewed the patient's chart and labs.  Questions were answered to the patient's satisfaction.     Jesenia Spera [employer]

## 2019-05-14 NOTE — Telephone Encounter (Signed)
Advanced Heart Failure Patient Advocate Encounter  Prior Authorization for Delene Loll has been approved.    Medicaid Recipient ID: [id number] B PA# [id number]  Effective dates: 05/14/19 through 05/11/20  Patients co-pay is $3.00  Audry Riles, PharmD, BCPS, CPP Heart Failure Clinic Pharmacist [phone]

## 2019-05-14 NOTE — TOC Benefit Eligibility Note (Signed)
Transition of Care [facility]) Benefit Eligibility Note    Patient Details  Name: Tina Fletcher MRN: [id number] Date of Birth: 11/11/1985                  Co-Pay: 3.90 Patient has medicaid effective 07/29/2016             [name] Phone Number: 05/14/2019, 4:09 PM

## 2019-05-14 NOTE — Progress Notes (Signed)
Patient on tele standby for cardiac MRI.

## 2019-05-21 ENCOUNTER — Ambulatory Visit (HOSPITAL_COMMUNITY)
Admission: RE | Admit: 2019-05-21 | Discharge: 2019-05-21 | Disposition: A | Discharge status: Home / Self Care | Payer: Medicaid Other | Source: Ambulatory Visit | Attending: Cardiology | Admitting: Cardiology

## 2019-05-21 ENCOUNTER — Other Ambulatory Visit: Payer: Self-pay

## 2019-05-21 ENCOUNTER — Encounter (HOSPITAL_COMMUNITY): Payer: Self-pay

## 2019-05-21 VITALS — BP 118/84 | HR 87 | Wt 128.6 lb

## 2019-05-21 DIAGNOSIS — F1010 Alcohol abuse, uncomplicated: Secondary | ICD-10-CM | POA: Diagnosis not present

## 2019-05-21 DIAGNOSIS — I428 Other cardiomyopathies: Secondary | ICD-10-CM

## 2019-05-21 DIAGNOSIS — I5022 Chronic systolic (congestive) heart failure: Secondary | ICD-10-CM

## 2019-05-21 DIAGNOSIS — I5021 Acute systolic (congestive) heart failure: Secondary | ICD-10-CM | POA: Diagnosis not present

## 2019-05-21 DIAGNOSIS — Z79899 Other long term (current) drug therapy: Secondary | ICD-10-CM | POA: Insufficient documentation

## 2019-05-21 DIAGNOSIS — Z825 Family history of asthma and other chronic lower respiratory diseases: Secondary | ICD-10-CM | POA: Diagnosis not present

## 2019-05-21 DIAGNOSIS — I429 Cardiomyopathy, unspecified: Secondary | ICD-10-CM | POA: Insufficient documentation

## 2019-05-21 DIAGNOSIS — I110 Hypertensive heart disease with heart failure: Secondary | ICD-10-CM | POA: Diagnosis not present

## 2019-05-21 LAB — BASIC METABOLIC PANEL
Anion gap: 5 (ref 5–15)
BUN: 13 mg/dL (ref 6–20)
CO2: 25 mmol/L (ref 22–32)
Calcium: 8.6 mg/dL — ABNORMAL LOW (ref 8.9–10.3)
Chloride: 107 mmol/L (ref 98–111)
Creatinine, Ser: 1.1 mg/dL — ABNORMAL HIGH (ref 0.44–1.00)
GFR calc Af Amer: >60 mL/min (ref >60)
GFR calc non Af Amer: >60 mL/min (ref >60)
Glucose, Bld: 97 mg/dL (ref 70–99)
Potassium: 3.8 mmol/L (ref 3.5–5.1)
Sodium: 137 mmol/L (ref 135–145)

## 2019-05-21 MED ORDER — SPIRONOLACTONE 25 MG PO TABS: 25.0000 mg | ORAL_TABLET | Freq: Every day | ORAL | 5 refills | Status: DC

## 2019-05-21 NOTE — Progress Notes (Unsigned)
ReDS Vest / Clip - 05/21/19 1500      ReDS Vest / Clip   Station Marker  C    Ruler Value  25    ReDS Value  Low volume    Anatomical Comments  sitting

## 2019-05-21 NOTE — Progress Notes (Signed)
Advanced Heart Failure Clinic Note   Referring Physician: PCP: Mikey Kirschner, MD PCP-Cardiologist: Dr. Aundra Dubin    HPI: 33yo female with PMH HTN who presented to [facility] on 05/10/19 with cough, SOB, LE swelling, orthopnea, bendopnea and 20lb weight gain over 3 months. She has a history of increased alcohol use over the past several months of 7-8 airplane bottles of liquor 3-4 days per week. BNP was elevated and CTA showed pulmonary edema. ECHO showed new onset systolic HF with EF 67% and decreased RV systolic function. Cardiology was consulted and she was transferred to [facility].  While admitted she was diuresed with IV lasix. Right and Left heart cath were done. LHC showed no significant coronary artery disease and RHC was within normal limits. Her cardiomyopathy is possibly secondary to recent increased alcohol use or uncontrolled HTN. Viral myocarditis was felt to be a possible etiology with recent cough, however cMRI showed no myocardial LGE, so no definitive evidence for prior MI,infiltrative disease, or myocarditis.  Also of note, she has no family history, negative HIV, and TSH was normal.   She was discharged home with entresto 24-26 bid, coreg 3.125 mg bid, spironolactone 12.5 mg qd, and ivabradine 5 mg bid. Discharge weight was 118 lb.   She reports to clinic for post hospital f/u. Here w/ her mother. Wt is up 10 lb from d/c wt at 128 lb. BP 118/84. Pulse rate 87 bpm. Feels tired and fatigue but no exertional or resting dyspnea. No LEE, orthopnea or PND. Has reduce etoh consumption significantly. On OCP for birth control.   Left Heart Cath 05/14/2019 Left Main  No significant coronary disease.  Left Anterior Descending  No significant coronary disease.  Ramus Intermedius  Moderate vessel, no significant coronary disease.  Left Circumflex  Relatively small, no significant coronary disease.  Right Coronary Artery  No significant coronary disease.     Right Heart Cath  05/14/2019 Right Heart Pressures RHC Procedural Findings: Hemodynamics (mmHg) RA mean 1 RV 23/1 PA 24/8, mean 14 PCWP mean 5 LV 119/6 AO 122/82  Oxygen saturations: PA 80% AO 100%  Cardiac Output (Fick) 5.87  Cardiac Index (Fick) 3.54     Echocardiogram: 05/10/2019 IMPRESSIONS   1. Left ventricular ejection fraction, by visual estimation, is approximately 15%. The left ventricle has severely decreased function. There is borderline left ventricular hypertrophy. Prominent posterolateral trabeculation. Transverse false tendon in  mid LV (normal variant). 2. Elevated left ventricular end-diastolic pressure. 3. Left ventricular diastolic parameters are indeterminate. 4. The left ventricle demonstrates global hypokinesis. 5. Global right ventricle shows reduced contraction.The right ventricular size is normal. No increase in right ventricular wall thickness. 6. Left atrial size was moderately dilated. 7. Right atrial size was normal. 8. Trivial pericardial effusion is present. 9. The pericardial effusion is posterior to the left ventricle and anterior to the right ventricle. 10. The mitral valve is grossly normal. Mild mitral valve regurgitation. 11. The tricuspid valve is grossly normal. Tricuspid valve regurgitation is mild. 12. The aortic valve is tricuspid. Aortic valve regurgitation is not visualized. 13. The pulmonic valve was grossly normal. Pulmonic valve regurgitation is trivial. 14. The inferior vena cava is dilated in size with >50% respiratory variability, suggesting right atrial pressure of 8 mmHg. 15. TR signal is inadequate for assessing pulmonary artery systolic pressure.   Cardiac MRI 05/14/19  IMPRESSION: 1.  Mildly dilated LV with diffuse hypokinesis, EF 16%.  2.  Normal RV size with EF 28%.  3. No myocardial LGE, so  no definitive evidence for prior MI, infiltrative disease, or myocarditis.   Review of Systems: [y] = yes, [ ]  = no    General: Weight gain [ ] ; Weight loss [ ] ; Anorexia [ ] ; Fatigue [ ] ; Fever [ ] ; Chills [ ] ; Weakness [ ]   Cardiac: Chest pain/pressure [ ] ; Resting SOB [ ] ; Exertional SOB [ ] ; Orthopnea [ ] ; Pedal Edema [ ] ; Palpitations [ ] ; Syncope [ ] ; Presyncope [ ] ; Paroxysmal nocturnal dyspnea[ ]   Pulmonary: Cough [ ] ; Wheezing[ ] ; Hemoptysis[ ] ; Sputum [ ] ; Snoring [ ]   GI: Vomiting[ ] ; Dysphagia[ ] ; Melena[ ] ; Hematochezia [ ] ; Heartburn[ ] ; Abdominal pain [ ] ; Constipation [ ] ; Diarrhea [ ] ; BRBPR [ ]   GU: Hematuria[ ] ; Dysuria [ ] ; Nocturia[ ]   Vascular: Pain in legs with walking [ ] ; Pain in feet with lying flat [ ] ; Non-healing sores [ ] ; Stroke [ ] ; TIA [ ] ; Slurred speech [ ] ;  Neuro: Headaches[ ] ; Vertigo[ ] ; Seizures[ ] ; Paresthesias[ ] ;Blurred vision [ ] ; Diplopia [ ] ; Vision changes [ ]   Ortho/Skin: Arthritis [ ] ; Joint pain [ ] ; Muscle pain [ ] ; Joint swelling [ ] ; Back Pain [ ] ; Rash [ ]   Psych: Depression[ ] ; Anxiety[ ]   Heme: Bleeding problems [ ] ; Clotting disorders [ ] ; Anemia [ ]   Endocrine: Diabetes [ ] ; Thyroid dysfunction[ ]    Past Medical History:  Diagnosis Date  . Abnormal menstrual periods 02/12/2014  . Herpes simplex without mention of complication   . Hypertension   . Medical history non-contributory   . Migraines   . Nexplanon insertion 08/13/2013   08/13/13 inserted left arm, remove 08/13/16  . Vitamin D deficiency 04/01/2016    Current Outpatient Medications  Medication Sig Dispense Refill  . acetaminophen (TYLENOL) 325 MG tablet Take 650 mg by mouth every 6 (six) hours as needed.    . carvedilol (COREG) 3.125 MG tablet Take 1 tablet (3.125 mg total) by mouth 2 (two) times daily with a meal. 60 tablet 5  . etonogestrel (NEXPLANON) 68 MG IMPL implant Inject 1 each into the skin once.    . ivabradine (CORLANOR) 5 MG TABS tablet Take 1 tablet (5 mg total) by mouth 2 (two) times daily with a meal. 60 tablet 5  . sacubitril-valsartan (ENTRESTO) 24-26 MG Take 1 tablet by  mouth 2 (two) times daily. 60 tablet 5  . spironolactone (ALDACTONE) 25 MG tablet Take 0.5 tablets (12.5 mg total) by mouth daily. 30 tablet 5  . traMADol (ULTRAM) 50 MG tablet Take by mouth every 6 (six) hours as needed.     No current facility-administered medications for this encounter.     No Known Allergies    Social History   Socioeconomic History  . Marital status: Single    Spouse name: Not on file  . Number of children: Not on file  . Years of education: Not on file  . Highest education level: Not on file  Occupational History  . Not on file  Social Needs  . Financial resource strain: Not on file  . Food insecurity    Worry: Not on file    Inability: Not on file  . Transportation needs    Medical: Not on file    Non-medical: Not on file  Tobacco Use  . Smoking status: Never Smoker  . Smokeless tobacco: Never Used  Substance and Sexual Activity  . Alcohol use: Yes    Comment: occ  . Drug use: No  .  Sexual activity: Yes    Birth control/protection: Implant  Lifestyle  . Physical activity    Days per week: Not on file    Minutes per session: Not on file  . Stress: Not on file  Relationships  . Social Musician on phone: Not on file    Gets together: Not on file    Attends religious service: Not on file    Active member of club or organization: Not on file    Attends meetings of clubs or organizations: Not on file    Relationship status: Not on file  . Intimate partner violence    Fear of current or ex partner: Not on file    Emotionally abused: Not on file    Physically abused: Not on file    Forced sexual activity: Not on file  Other Topics Concern  . Not on file  Social History Narrative  . Not on file      Family History  Problem Relation Age of Onset  . Diabetes Maternal Grandmother   . Cancer Paternal Grandfather        prancreatic  . COPD Maternal Grandfather   . Cancer Maternal Grandfather        prostate  . Asthma Maternal  Grandfather   . Asthma Son   . Bronchitis Son     Vitals:   05/21/19 1513  BP: 118/84  Pulse: 87  SpO2: 100%  Weight: 58.3 kg (128 lb 9.6 oz)     PHYSICAL EXAM: General:  Thin, young AAF. Well appearing. No respiratory difficulty HEENT: normal Neck: supple. no JVD. Carotids 2+ bilat; no bruits. No lymphadenopathy or thyromegaly appreciated. Cor: PMI nondisplaced. Regular rate & rhythm. No rubs, gallops or murmurs. Lungs: clear Abdomen: soft, nontender, nondistended. No hepatosplenomegaly. No bruits or masses. Good bowel sounds. Extremities: no cyanosis, clubbing, rash, edema Neuro: alert & oriented x 3, cranial nerves grossly intact. moves all 4 extremities w/o difficulty. Affect pleasant.  ECG: not performed    ASSESSMENT & PLAN:  1. Acute systolic CHF: Cardiomyopathy of uncertain etiology. She has been symptomatic for about 3 months. No FH cardiomyopathy, negative HIV and normal TSH, no recent pregnancy. She has been drinking heavily for the last several months, ETOH may contribute to her cardiomyopathy. Uncontrolled HTN may play a role. LHC 04/2019 w/ no CAD, filling pressures optimized and preserved cardiac output. Cardiac MRI showed no myocardial LGE, so no definitive evidence for prior MI, infiltrative disease, or myocarditis. Suspect likely 2/2 ETOH abuse. - She has reduced ETOH consumption considerably. We discussed how excessive ETOH consumption can contribute to HF and pt encouraged to continue to refrain from use.  - Continue Entresto 24/26 bid. BP too soft for titration (also bothered w/ fatigue) - Euvolemic on exam. ReDs clip 34%. No loop diuretic requirements currently.  - Will increase Spironolactone however to 25 mg daily. Check BMP today and again in 7 days.  - Continue Coreg 3.125 mg bid.  - Continue Corlanor 5 mg bid.  - We discussed importance of avoiding pregnancy given risk of fetotoxicity w/ HF meds. She reports she is compliant w/ OCPs.  2. HTN:  Controlling with meds as above.  3. ETOH abuse: Drinking fairly heavily prior to recent admit, discussed stopping.  F/u BMP in 7 days. F/u w/ PharmD in 2-3 weeks for further med titration. F/u with Dr. Shirlee Latch in 6-8 weeks.    Robbie Lis, PA-C 05/21/19

## 2019-05-21 NOTE — Patient Instructions (Addendum)
INCREASE Spironolactone to 25mg  daily.  Routine lab work today. Will notify you of abnormal results  Repeat labs in 7-10 days.  Follow up with PharmD clinic in 2-3 weeks.  Follow up with Dr.McLean in 6-8 weeks.

## 2019-06-13 ENCOUNTER — Inpatient Hospital Stay (HOSPITAL_COMMUNITY)
Admission: RE | Admit: 2019-06-13 | Discharge: 2019-06-13 | Disposition: A | Discharge status: Home / Self Care | Payer: Medicaid Other | Source: Ambulatory Visit

## 2019-07-05 ENCOUNTER — Inpatient Hospital Stay (HOSPITAL_COMMUNITY): Admission: RE | Admit: 2019-07-05 | Payer: Medicaid Other | Source: Ambulatory Visit

## 2019-07-16 ENCOUNTER — Telehealth (HOSPITAL_COMMUNITY): Payer: Self-pay

## 2019-07-16 NOTE — Telephone Encounter (Signed)

## 2019-07-17 ENCOUNTER — Ambulatory Visit (HOSPITAL_COMMUNITY)
Admission: RE | Admit: 2019-07-17 | Discharge: 2019-07-17 | Disposition: A | Discharge status: Home / Self Care | Payer: Medicaid Other | Source: Ambulatory Visit | Attending: Cardiology | Admitting: Cardiology

## 2019-07-17 ENCOUNTER — Other Ambulatory Visit: Payer: Self-pay

## 2019-07-17 ENCOUNTER — Encounter (HOSPITAL_COMMUNITY): Payer: Self-pay | Admitting: Cardiology

## 2019-07-17 VITALS — BP 162/109 | HR 103 | Wt 135.8 lb

## 2019-07-17 DIAGNOSIS — F1010 Alcohol abuse, uncomplicated: Secondary | ICD-10-CM | POA: Diagnosis not present

## 2019-07-17 DIAGNOSIS — Z825 Family history of asthma and other chronic lower respiratory diseases: Secondary | ICD-10-CM | POA: Insufficient documentation

## 2019-07-17 DIAGNOSIS — Z79899 Other long term (current) drug therapy: Secondary | ICD-10-CM | POA: Diagnosis not present

## 2019-07-17 DIAGNOSIS — I429 Cardiomyopathy, unspecified: Secondary | ICD-10-CM | POA: Insufficient documentation

## 2019-07-17 DIAGNOSIS — I110 Hypertensive heart disease with heart failure: Secondary | ICD-10-CM | POA: Insufficient documentation

## 2019-07-17 DIAGNOSIS — R9431 Abnormal electrocardiogram [ECG] [EKG]: Secondary | ICD-10-CM | POA: Insufficient documentation

## 2019-07-17 DIAGNOSIS — I5022 Chronic systolic (congestive) heart failure: Secondary | ICD-10-CM | POA: Diagnosis not present

## 2019-07-17 DIAGNOSIS — I5021 Acute systolic (congestive) heart failure: Secondary | ICD-10-CM | POA: Diagnosis present

## 2019-07-17 LAB — BASIC METABOLIC PANEL
Anion gap: 14 (ref 5–15)
BUN: 14 mg/dL (ref 6–20)
CO2: 17 mmol/L — ABNORMAL LOW (ref 22–32)
Calcium: 8.9 mg/dL (ref 8.9–10.3)
Chloride: 105 mmol/L (ref 98–111)
Creatinine, Ser: 0.94 mg/dL (ref 0.44–1.00)
GFR calc Af Amer: >60 mL/min (ref >60)
GFR calc non Af Amer: >60 mL/min (ref >60)
Glucose, Bld: 112 mg/dL — ABNORMAL HIGH (ref 70–99)
Potassium: 3.1 mmol/L — ABNORMAL LOW (ref 3.5–5.1)
Sodium: 136 mmol/L (ref 135–145)

## 2019-07-17 MED ORDER — CARVEDILOL 6.25 MG PO TABS: 6.2500 mg | ORAL_TABLET | Freq: Two times a day (BID) | ORAL | 3 refills | Status: DC

## 2019-07-17 NOTE — Patient Instructions (Signed)
INCREASE Coreg (Carvedilol) to 6.25mg  (1 tab) twice a day   Use a pillbox!  Using a pillbox will help you to organize your medications and maintain compliance.    Labs today  We will only contact you if something comes back abnormal or we need to make some changes. Otherwise no news is good news!   Your physician recommends that you schedule a follow-up appointment in: 2 weeks with the pharmacist.  You will see her for 2-3 visits.      Your physician recommends that you schedule a follow-up appointment in: 2 months with an echo and appointment with Dr Shirlee Latch.    Your physician has requested that you have an echocardiogram. Echocardiography is a painless test that uses sound waves to create images of your heart. It provides your doctor with information about the size and shape of your heart and how well your heart's chambers and valves are working. This procedure takes approximately one hour. There are no restrictions for this procedure.    Please call office at [phone] option [phone] if you have any questions or concerns.    At the [facility], you and your health needs are our priority. As part of our continuing mission to provide you with exceptional heart care, we have created designated Provider Care Teams. These Care Teams include your primary Cardiologist (physician) and Advanced Practice Providers (APPs- Physician Assistants and Nurse Practitioners) who all work together to provide you with the care you need, when you need it.   You may see any of the following providers on your designated Care Team at your next follow up: Marland Kitchen Dr Arvilla Meres . Dr Marca Ancona . Tonye Becket, NP . Robbie Lis, PA . Karle Plumber, PharmD   Please be sure to bring in all your medications bottles to every appointment.

## 2019-07-18 NOTE — Progress Notes (Addendum)
[facility] Note   Referring Physician: PCP: Merlyn Albert, MD PCP-Cardiologist: Dr. Shirlee Latch    HPI: 34 y.o. female with past history of HTN presented to [facility] on 05/10/19 with cough, SOB, LE swelling, orthopnea, bendopnea and 20lb weight gain over 3 months. She had a history of increased alcohol use over the several months prior with 7-8 airplane bottles of liquor 3-4 days per week. BNP was elevated and CTA showed pulmonary edema. Echo showed new onset systolic HF with EF 15% and decreased RV systolic function. Cardiology was consulted and she was transferred to [facility].  While admitted she was diuresed with IV lasix. Right and Left heart cath were done. LHC showed no significant coronary artery disease and RHC was within normal limits. Her cardiomyopathy is possibly secondary to recent increased alcohol use or uncontrolled HTN. Viral myocarditis was felt to be a possible etiology with recent cough, however cMRI showed no myocardial LGE, so no definitive evidence for prior MI, infiltrative disease, or myocarditis.  Also of note, she has no family history of cardiomyopathy, negative HIV, and TSH was normal.   She returns today for followup of CHF.  She is now drinking ETOH only very rarely, corroborated by her mother.  She is taking OCPs.  She is feeling much better compared to 11/20, able to walk around [employer] with no problems.  No significant dyspnea with her usual ADLs.  No orthopnea/PND.  No lightheadedness or palpitations.  No chest pain.  BP is very high today, but I think that the only medication she actually took today was her OCP.  She does have the other meds at home. She says that she takes them on most days.   Labs (11/20): K 3.8, creatinine 1.1  ECG (personally reviewed): NSR at 86, nonspecific T wave flattening.   PMH: 1. HTN 2. ETOH abuse: Has quit.  3. Chronic systolic CHF: Nonischemic cardiomyopathy.  - LHC/RHC (11/20): No significant CAD; mean RA 1, PA  24/8, mean PCWP 5, CI 3.54.  - Echo (11/20): EF 15%, diffuse hypokinesis, mildly decreased RV systolic function.  - CMRI (11/20): LV mildly dilated with EF 16%, normal RV size with EF 28%, no myocardial LGE.   Review of Systems: All systems reviewed and negative except as per HPI.    Current Outpatient Medications  Medication Sig Dispense Refill  . acetaminophen (TYLENOL) 325 MG tablet Take 650 mg by mouth every 6 (six) hours as needed.    . etonogestrel (NEXPLANON) 68 MG IMPL implant Inject 1 each into the skin once.    . ivabradine (CORLANOR) 5 MG TABS tablet Take 1 tablet (5 mg total) by mouth 2 (two) times daily with a meal. 60 tablet 5  . sacubitril-valsartan (ENTRESTO) 24-26 MG Take 1 tablet by mouth 2 (two) times daily. 60 tablet 5  . spironolactone (ALDACTONE) 25 MG tablet Take 1 tablet (25 mg total) by mouth daily. 30 tablet 5  . traMADol (ULTRAM) 50 MG tablet Take by mouth every 6 (six) hours as needed.    . carvedilol (COREG) 6.25 MG tablet Take 1 tablet (6.25 mg total) by mouth 2 (two) times daily. 180 tablet 3   No current facility-administered medications for this encounter.    No Known Allergies    Social History   Socioeconomic History  . Marital status: Single    Spouse name: Not on file  . Number of children: Not on file  . Years of education: Not on file  . Highest  education level: Not on file  Occupational History  . Not on file  Tobacco Use  . Smoking status: Never Smoker  . Smokeless tobacco: Never Used  Substance and Sexual Activity  . Alcohol use: Yes    Comment: occ  . Drug use: No  . Sexual activity: Yes    Birth control/protection: Implant  Other Topics Concern  . Not on file  Social History Narrative  . Not on file   Social Determinants of Health   Financial Resource Strain:   . Difficulty of Paying Living Expenses: Not on file  Food Insecurity:   . Worried About Programme researcher, broadcasting/film/video in the Last Year: Not on file  . Ran Out of Food in  the Last Year: Not on file  Transportation Needs:   . Lack of Transportation (Medical): Not on file  . Lack of Transportation (Non-Medical): Not on file  Physical Activity:   . Days of Exercise per Week: Not on file  . Minutes of Exercise per Session: Not on file  Stress:   . Feeling of Stress : Not on file  Social Connections:   . Frequency of Communication with Friends and Family: Not on file  . Frequency of Social Gatherings with Friends and Family: Not on file  . Attends Religious Services: Not on file  . Active Member of Clubs or Organizations: Not on file  . Attends Banker Meetings: Not on file  . Marital Status: Not on file  Intimate Partner Violence:   . Fear of Current or Ex-Partner: Not on file  . Emotionally Abused: Not on file  . Physically Abused: Not on file  . Sexually Abused: Not on file      Family History  Problem Relation Age of Onset  . Diabetes Maternal Grandmother   . Cancer Paternal Grandfather        prancreatic  . COPD Maternal Grandfather   . Cancer Maternal Grandfather        prostate  . Asthma Maternal Grandfather   . Asthma Son   . Bronchitis Son     Vitals:   07/17/19 1500  BP: (!) 162/109  Pulse: (!) 103  SpO2: 100%  Weight: 61.6 kg (135 lb 12.8 oz)     PHYSICAL EXAM: General: NAD Neck: No JVD, no thyromegaly or thyroid nodule.  Lungs: Clear to auscultation bilaterally with normal respiratory effort. CV: Nondisplaced PMI.  Heart regular S1/S2, no S3/S4, no murmur.  No peripheral edema.  No carotid bruit.  Normal pedal pulses.  Abdomen: Soft, nontender, no hepatosplenomegaly, no distention.  Skin: Intact without lesions or rashes.  Neurologic: Alert and oriented x 3.  Psych: Normal affect. Extremities: No clubbing or cyanosis.  HEENT: Normal.   ASSESSMENT & PLAN:  1. Chronic systolic CHF: Cardiomyopathy of uncertain etiology. No FH cardiomyopathy, negative HIV and normal TSH, no recent pregnancy. She had been  drinking heavily for several months prior to diagnosis, ETOH may contribute to her cardiomyopathy. Uncontrolled HTN may play a role. LHC 04/2019 w/ no CAD, filling pressures optimized and preserved cardiac output. Cardiac MRI showed no myocardial LGE, so no definitive evidence for prior MI, infiltrative disease, or myocarditis. Suspect most likely 2/2 ETOH abuse. Now doing better, NYHA class I-II symptoms.  Not volume overloaded.  Questionable compliance with medication regimen. She has quit ETOH.  - We discussed today how important it is to take all her meds every day.  - Stay off ETOH.  - Continue Entresto 24/26 bid.  -  Continue spironolactone 25 mg daily. BMET today.  - Increase Coreg to 6.25 mg bid.  - Continue Corlanor 5 mg bid.  - We discussed importance of avoiding pregnancy given risk of fetotoxicity w/ HF meds. She reports she is compliant w/ OCPs.  2. HTN: Very high today but has not taken her meds.  Long discussion about the importance of medication compliance.  Her mother is here today and will help her.  3. ETOH abuse: She has quit ETOH, congratulated her.  4. Prolonged QT interval: Need to check BMET and magnesium, make sure she is taking beta blocker.  Continue to follow this. ?Acquired versus congenital.   F/u with HF pharmacist in 2 wks for medication titration.  Can see her for 2-3 visits, then followup with me with echo in 2 months.    Loralie Champagne, MD 07/18/19

## 2019-07-18 NOTE — Addendum Note (Signed)
Encounter addended by: Laurey Morale, MD on: 07/18/2019 1:59 PM  Actions taken: Clinical Note Signed

## 2019-07-20 ENCOUNTER — Other Ambulatory Visit (HOSPITAL_COMMUNITY): Payer: Self-pay | Admitting: *Deleted

## 2019-07-20 MED ORDER — POTASSIUM CHLORIDE ER 20 MEQ PO TBCR: 20.0000 meq | EXTENDED_RELEASE_TABLET | Freq: Every day | ORAL | 3 refills | Status: DC

## 2019-07-24 ENCOUNTER — Inpatient Hospital Stay (HOSPITAL_COMMUNITY): Admission: RE | Admit: 2019-07-24 | Payer: Medicaid Other | Source: Ambulatory Visit

## 2019-07-30 ENCOUNTER — Encounter: Payer: Self-pay | Admitting: Family Medicine

## 2019-08-01 NOTE — Progress Notes (Signed)
Referring Physician: PCP: Mikey Kirschner, MD PCP-Cardiologist: Dr. Aundra Dubin    HPI:  34 yo female with PMH HTN who presented to [facility] on 05/10/19 with cough, SOB, LE swelling, orthopnea, bendopnea and 20lb weight gain over 3 months. She has a history of increased alcohol use over the past several months of 7-8 airplane bottles of liquor 3-4 days per week. BNP was elevated and CTA showed pulmonary edema. ECHO showed new onset systolic HF with EF 60% and decreased RV systolic function. Cardiology was consulted and she was transferred to [facility].   While admitted she was diuresed with IV lasix. Right and Left heart cath were done. LHC showed no significant coronary artery disease and RHC was within normal limits. Her cardiomyopathy is possibly secondary to recent increased alcohol use or uncontrolled HTN. Viral myocarditis was felt to be a possible etiology with recent cough, however cMRI showed no myocardial LGE, so no definitive evidence for prior MI, infiltrative disease, or myocarditis.  Also of note, she has no family history, negative HIV, and TSH was normal.    She was discharged home with entresto 24-26 bid, coreg 3.125 mg bid, spironolactone 12.5 mg daily, and ivabradine 5 mg bid. Discharge weight was 118 lb.    Recently presented to HF Clinic for follow up on 07/17/19 with Dr. Aundra Dubin. At that visit she reported only drinking ETOH very rarely, corroborated by her mother.  She was taking OCPs.  She was feeling much better compared to 05/18/19, able to walk around [employer] with no problems.  No significant dyspnea with her usual ADLs.  No orthopnea/PND.  No lightheadedness or palpitations.  No chest pain.  She says that she takes her medications on most days.  Today she returns to HF clinic for pharmacist medication titration. At last visit with MD, carvedilol was increased to 6.25 mg BID. Potassium Chloride 20 mEq daily was added for K of 3.1. Overall she is doing well today. BP elevated in clinic  to 160/98.  She stated she took all her medications this morning. No dizziness, lightheadedness, chest pains or palpitations. No SOB/DOE. She weighs herself daily at home and her normal weight is 138-140 lbs. She is not taking any diuretic. No LEE, PND or orthopnea. Her appetite is good. We discussed following a low sodium diet. She says she takes her medications most days, but only takes her potassium supplement 3-4 times per week because it is too large.      . Shortness of breath/dyspnea on exertion? no  . Orthopnea/PND? no . Edema? no . Lightheadedness/dizziness? no . Daily weights at home? yes . Blood pressure/heart rate monitoring at home? no . Following low-sodium/fluid-restricted diet? no  HF Medications: Carvedilol 6.25 mg BID Entresto 24/26 mg BID Spironolactone 25 mg daily Ivabradine 5 mg BID Potassium 20 mEq daily  Has the patient been experiencing any side effects to the medications prescribed?  no  Does the patient have any problems obtaining medications due to transportation or finances?   No - Has [place] Medicaid  Understanding of regimen: fair Understanding of indications: fair Potential of compliance: fair Patient understands to avoid NSAIDs. Patient understands to avoid decongestants.    Pertinent Lab Values: . Serum creatinine 0.89, BUN 10, Potassium 3.6, Sodium 140.  Vital Signs: . Weight: 137.2 lbs (last clinic weight: 135.8 lbs) . Blood pressure: 160/98  . Heart rate: 81   Assessment: 1. Acute systolic CHF: Cardiomyopathy of uncertain etiology.  She has been symptomatic for about 3 months.  No FH cardiomyopathy, negative HIV and normal TSH, no recent pregnancy.  She has been drinking heavily for the last several months, ETOH may contribute to her cardiomyopathy.  Uncontrolled HTN may play a role. LHC 04/2019 w/ no CAD, filling pressures optimized and preserved cardiac output. Cardiac MRI showed no myocardial LGE, so no definitive evidence for prior MI,  infiltrative disease, or myocarditis. Suspect likely 2/2 ETOH abuse. - She has reduced ETOH consumption considerably. We discussed how excessive ETOH consumption can contribute to HF and pt encouraged to continue to refrain from use.  - NYHA Class I-II. Euvolemic on exam. No loop diuretic requirements currently. - Vitals: BP elevated at 160/98 (stated she took all her morning meds); HR 81 - Labs: Scr stable at 0.89, K improved from 3.1 to 3.6. Will change potassium chloride 20 mEq tablets to the 10 mEq strength as she is having difficulty swallowing the 20 mEqs. Will have her continue the same dose of 20 mEq daily.  - Not taking any diuretic.  - Continue carvedilol 6.25 mg BID.  - Increase Entresto to 49/51 mg BID. Repeat BMET in 3 weeks.  - Continue Spironolactone 25 mg daily.  - Continue Corlanor 5 mg BID.  - We discussed importance of avoiding pregnancy given risk of fetotoxicity w/ HF meds. She has Nexplanon implant.  2. HTN:  -BP elevated today at 160/98 -Increase Entresto to 49/51 mg BID. Continue carvedilol and spironolactone.  3. ETOH abuse: Drinking fairly heavily prior to recent admit, discussed stopping.   Plan: 1) Medication changes: Based on clinical presentation, vital signs and recent labs will Increase Entresto to 49/51 mg BID. Repeat BMET in 3 weeks. 2) Labs: Scr 0.89, K 3.6 3) Follow-up: 3 weeks with Pharmacy Clinic  Karle Plumber, PharmD, BCPS, BCCP, CPP [facility] Clinic Pharmacist [phone]

## 2019-08-02 ENCOUNTER — Ambulatory Visit (HOSPITAL_COMMUNITY)
Admission: RE | Admit: 2019-08-02 | Discharge: 2019-08-02 | Disposition: A | Discharge status: Home / Self Care | Payer: Medicaid Other | Source: Ambulatory Visit | Attending: Internal Medicine | Admitting: Internal Medicine

## 2019-08-02 ENCOUNTER — Other Ambulatory Visit: Payer: Self-pay

## 2019-08-02 VITALS — BP 160/98 | HR 81 | Wt 137.2 lb

## 2019-08-02 DIAGNOSIS — I110 Hypertensive heart disease with heart failure: Secondary | ICD-10-CM | POA: Diagnosis not present

## 2019-08-02 DIAGNOSIS — Z7289 Other problems related to lifestyle: Secondary | ICD-10-CM | POA: Insufficient documentation

## 2019-08-02 DIAGNOSIS — I5021 Acute systolic (congestive) heart failure: Secondary | ICD-10-CM | POA: Diagnosis present

## 2019-08-02 DIAGNOSIS — I429 Cardiomyopathy, unspecified: Secondary | ICD-10-CM | POA: Diagnosis not present

## 2019-08-02 LAB — BASIC METABOLIC PANEL
Anion gap: 10 (ref 5–15)
BUN: 10 mg/dL (ref 6–20)
CO2: 20 mmol/L — ABNORMAL LOW (ref 22–32)
Calcium: 8.9 mg/dL (ref 8.9–10.3)
Chloride: 110 mmol/L (ref 98–111)
Creatinine, Ser: 0.89 mg/dL (ref 0.44–1.00)
GFR calc Af Amer: >60 mL/min (ref >60)
GFR calc non Af Amer: >60 mL/min (ref >60)
Glucose, Bld: 108 mg/dL — ABNORMAL HIGH (ref 70–99)
Potassium: 3.6 mmol/L (ref 3.5–5.1)
Sodium: 140 mmol/L (ref 135–145)

## 2019-08-02 MED ORDER — SACUBITRIL-VALSARTAN 49-51 MG PO TABS: 1.0000 | ORAL_TABLET | Freq: Two times a day (BID) | ORAL | 11 refills | Status: DC

## 2019-08-02 MED ORDER — POTASSIUM CHLORIDE ER 10 MEQ PO TBCR: 20.0000 meq | EXTENDED_RELEASE_TABLET | Freq: Every day | ORAL | 3 refills | Status: DC

## 2019-08-02 NOTE — Patient Instructions (Signed)
It was a pleasure seeing you today!  MEDICATIONS: -We are changing your medications today -Increase Entresto to 49/51 mg (1 tablet) twice daily. You  May take 2 tablets of the 24/26 mg strength twice daily until you pick up the new prescription. -Call if you have questions about your medications.  LABS: -We will call you if your labs need attention.  NEXT APPOINTMENT: Return to clinic in 1 week with Pharmacy Clinic.  In general, to take care of your heart failure: -Limit your fluid intake to 2 Liters (half-gallon) per day.   -Limit your salt intake to ideally 2-3 grams (2000-3000 mg) per day. -Weigh yourself daily and record, and bring that "weight diary" to your next appointment.  (Weight gain of 2-3 pounds in 1 day typically means fluid weight.) -The medications for your heart are to help your heart and help you live longer.   -Please contact us before stopping any of your heart medications.  Call the clinic at [phone] with questions or to reschedule future appointments.

## 2019-08-08 ENCOUNTER — Ambulatory Visit (HOSPITAL_COMMUNITY): Payer: Medicaid Other

## 2019-08-21 ENCOUNTER — Inpatient Hospital Stay (HOSPITAL_COMMUNITY)
Admission: RE | Admit: 2019-08-21 | Discharge: 2019-08-21 | Disposition: A | Discharge status: Home / Self Care | Payer: Medicaid Other | Source: Ambulatory Visit

## 2019-09-17 ENCOUNTER — Other Ambulatory Visit: Payer: Self-pay

## 2019-09-17 ENCOUNTER — Encounter (HOSPITAL_COMMUNITY): Payer: Self-pay | Admitting: Cardiology

## 2019-09-17 ENCOUNTER — Ambulatory Visit (HOSPITAL_BASED_OUTPATIENT_CLINIC_OR_DEPARTMENT_OTHER)
Admission: RE | Admit: 2019-09-17 | Discharge: 2019-09-17 | Disposition: A | Discharge status: Home / Self Care | Payer: Medicaid Other | Source: Ambulatory Visit | Attending: Cardiology | Admitting: Cardiology

## 2019-09-17 ENCOUNTER — Ambulatory Visit (HOSPITAL_COMMUNITY)
Admission: RE | Admit: 2019-09-17 | Discharge: 2019-09-17 | Disposition: A | Discharge status: Home / Self Care | Payer: Medicaid Other | Source: Ambulatory Visit | Attending: Cardiology | Admitting: Cardiology

## 2019-09-17 VITALS — BP 160/110 | HR 77 | Wt 132.0 lb

## 2019-09-17 DIAGNOSIS — I110 Hypertensive heart disease with heart failure: Secondary | ICD-10-CM | POA: Diagnosis not present

## 2019-09-17 DIAGNOSIS — I429 Cardiomyopathy, unspecified: Secondary | ICD-10-CM | POA: Diagnosis not present

## 2019-09-17 DIAGNOSIS — I701 Atherosclerosis of renal artery: Secondary | ICD-10-CM | POA: Diagnosis not present

## 2019-09-17 DIAGNOSIS — Z7901 Long term (current) use of anticoagulants: Secondary | ICD-10-CM | POA: Diagnosis not present

## 2019-09-17 DIAGNOSIS — I5022 Chronic systolic (congestive) heart failure: Secondary | ICD-10-CM | POA: Insufficient documentation

## 2019-09-17 DIAGNOSIS — Z79899 Other long term (current) drug therapy: Secondary | ICD-10-CM | POA: Insufficient documentation

## 2019-09-17 DIAGNOSIS — R9431 Abnormal electrocardiogram [ECG] [EKG]: Secondary | ICD-10-CM | POA: Insufficient documentation

## 2019-09-17 DIAGNOSIS — I5021 Acute systolic (congestive) heart failure: Secondary | ICD-10-CM

## 2019-09-17 DIAGNOSIS — F1010 Alcohol abuse, uncomplicated: Secondary | ICD-10-CM | POA: Diagnosis not present

## 2019-09-17 LAB — BASIC METABOLIC PANEL
Anion gap: 11 (ref 5–15)
BUN: 11 mg/dL (ref 6–20)
CO2: 23 mmol/L (ref 22–32)
Calcium: 9 mg/dL (ref 8.9–10.3)
Chloride: 103 mmol/L (ref 98–111)
Creatinine, Ser: 0.91 mg/dL (ref 0.44–1.00)
GFR calc Af Amer: >60 mL/min (ref >60)
GFR calc non Af Amer: >60 mL/min (ref >60)
Glucose, Bld: 98 mg/dL (ref 70–99)
Potassium: 3.6 mmol/L (ref 3.5–5.1)
Sodium: 137 mmol/L (ref 135–145)

## 2019-09-17 MED ORDER — SACUBITRIL-VALSARTAN 97-103 MG PO TABS: 1.0000 | ORAL_TABLET | Freq: Two times a day (BID) | ORAL | 5 refills | Status: DC

## 2019-09-17 MED ORDER — CARVEDILOL 12.5 MG PO TABS: 12.5000 mg | ORAL_TABLET | Freq: Two times a day (BID) | ORAL | 4 refills | Status: DC

## 2019-09-17 NOTE — Patient Instructions (Addendum)
INCREASE Entresto to 97/103mg  (1 tab) twice daily  INCREASE Coreg to 12.5mg  (1 tab) twice a day  Labs today and repeat in 10 days We will only contact you if something comes back abnormal or we need to make some changes. Otherwise no news is good news!  You have been ordered an ultrasound of your Kidneys.  You will get a call to schedule this appointment.   Your physician recommends that you schedule a follow-up appointment in: 3 weeks with Pharmacist Lauren and 2 monts with Dr Shirlee Latch  Please call office at [phone] option [phone] if you have any questions or concerns.   At the [facility], you and your health needs are our priority. As part of our continuing mission to provide you with exceptional heart care, we have created designated Provider Care Teams. These Care Teams include your primary Cardiologist (physician) and Advanced Practice Providers (APPs- Physician Assistants and Nurse Practitioners) who all work together to provide you with the care you need, when you need it.   You may see any of the following providers on your designated Care Team at your next follow up: Marland Kitchen Dr Arvilla Meres . Dr Marca Ancona . Tonye Becket, NP . Robbie Lis, PA . Karle Plumber, PharmD   Please be sure to bring in all your medications bottles to every appointment.

## 2019-09-17 NOTE — Progress Notes (Signed)
Advanced Heart Failure Clinic Note   Referring Physician: PCP: Merlyn Albert, MD PCP-Cardiologist: Dr. Shirlee Latch    HPI: 34 y.o. female with past history of HTN presented to [facility] on 05/10/19 with cough, SOB, LE swelling, orthopnea, bendopnea and 20lb weight gain over 3 months. She had a history of increased alcohol use over the several months prior with 7-8 airplane bottles of liquor 3-4 days per week. BNP was elevated and CTA showed pulmonary edema. Echo showed new onset systolic HF with EF 15% and decreased RV systolic function. Cardiology was consulted and she was transferred to [facility].   While admitted she was diuresed with IV lasix. Right and Left heart cath were done. LHC showed no significant coronary artery disease and RHC was within normal limits. Her cardiomyopathy is possibly secondary to recent increased alcohol use or uncontrolled HTN. Viral myocarditis was felt to be a possible etiology with recent cough, however cMRI showed no myocardial LGE, so no definitive evidence for prior MI, infiltrative disease, or myocarditis.  Also of note, she has no family history of cardiomyopathy, negative HIV, and TSH was normal.   Echo was done today and reviewed, EF 30-35%, global hypokinesis, mild RV dilation with normal function, mild MR.   She returns today for followup of CHF.  She is now drinking ETOH only very rarely, corroborated by her mother.  She is on contraception.   BP is still very high.  She says that she took all her medications today.  She feels good overall.  No significant exertional dyspnea. Not fatigued.  Able to walk around stores with her mother (was not able to do this a couple of months ago).  No orthopnea/PND.  No chest pain.  No lightheadedness.   Labs (11/20): K 3.8, creatinine 1.1 Labs (2/21): K 3.6, creatinine 0.89  ECG (personally reviewed): NSR at 86, nonspecific T wave flattening.   PMH: 1. HTN 2. ETOH abuse: Has quit.  3. Chronic systolic CHF: Nonischemic  cardiomyopathy.  - LHC/RHC (11/20): No significant CAD; mean RA 1, PA 24/8, mean PCWP 5, CI 3.54.  - Echo (11/20): EF 15%, diffuse hypokinesis, mildly decreased RV systolic function.  - CMRI (11/20): LV mildly dilated with EF 16%, normal RV size with EF 28%, no myocardial LGE.  - Echo (3/21): EF 30-35%, global hypokinesis, mild RV dilation with normal function, mild MR.   Review of Systems: All systems reviewed and negative except as per HPI.    Current Outpatient Medications  Medication Sig Dispense Refill  . acetaminophen (TYLENOL) 325 MG tablet Take 650 mg by mouth every 6 (six) hours as needed.    . carvedilol (COREG) 12.5 MG tablet Take 1 tablet (12.5 mg total) by mouth 2 (two) times daily. 60 tablet 4  . etonogestrel (NEXPLANON) 68 MG IMPL implant Inject 1 each into the skin once.    . ivabradine (CORLANOR) 5 MG TABS tablet Take 1 tablet (5 mg total) by mouth 2 (two) times daily with a meal. 60 tablet 5  . potassium chloride (KLOR-CON) 10 MEQ tablet Take 2 tablets (20 mEq total) by mouth daily. 60 tablet 3  . spironolactone (ALDACTONE) 25 MG tablet Take 1 tablet (25 mg total) by mouth daily. 30 tablet 5  . traMADol (ULTRAM) 50 MG tablet Take by mouth every 6 (six) hours as needed.    . sacubitril-valsartan (ENTRESTO) 97-103 MG Take 1 tablet by mouth 2 (two) times daily. 60 tablet 5   No current facility-administered medications for this encounter.  No Known Allergies    Social History   Socioeconomic History  . Marital status: Single    Spouse name: Not on file  . Number of children: Not on file  . Years of education: Not on file  . Highest education level: Not on file  Occupational History  . Not on file  Tobacco Use  . Smoking status: Never Smoker  . Smokeless tobacco: Never Used  Substance and Sexual Activity  . Alcohol use: Yes    Comment: occ  . Drug use: No  . Sexual activity: Yes    Birth control/protection: Implant  Other Topics Concern  . Not on file   Social History Narrative  . Not on file   Social Determinants of Health   Financial Resource Strain:   . Difficulty of Paying Living Expenses:   Food Insecurity:   . Worried About Programme researcher, broadcasting/film/video in the Last Year:   . Barista in the Last Year:   Transportation Needs:   . Freight forwarder (Medical):   Marland Kitchen Lack of Transportation (Non-Medical):   Physical Activity:   . Days of Exercise per Week:   . Minutes of Exercise per Session:   Stress:   . Feeling of Stress :   Social Connections:   . Frequency of Communication with Friends and Family:   . Frequency of Social Gatherings with Friends and Family:   . Attends Religious Services:   . Active Member of Clubs or Organizations:   . Attends Banker Meetings:   Marland Kitchen Marital Status:   Intimate Partner Violence:   . Fear of Current or Ex-Partner:   . Emotionally Abused:   Marland Kitchen Physically Abused:   . Sexually Abused:       Family History  Problem Relation Age of Onset  . Diabetes Maternal Grandmother   . Cancer Paternal Grandfather        prancreatic  . COPD Maternal Grandfather   . Cancer Maternal Grandfather        prostate  . Asthma Maternal Grandfather   . Asthma Son   . Bronchitis Son     Vitals:   09/17/19 1114  BP: (!) 160/110  Pulse: 77  SpO2: 100%  Weight: 59.9 kg (132 lb)   PHYSICAL EXAM: General: NAD Neck: No JVD, no thyromegaly or thyroid nodule.  Lungs: Clear to auscultation bilaterally with normal respiratory effort. CV: Nondisplaced PMI.  Heart regular S1/S2, no S3/S4, no murmur.  No peripheral edema.  No carotid bruit.  Normal pedal pulses.  Abdomen: Soft, nontender, no hepatosplenomegaly, no distention.  Skin: Intact without lesions or rashes.  Neurologic: Alert and oriented x 3.  Psych: Normal affect. Extremities: No clubbing or cyanosis.  HEENT: Normal.   ASSESSMENT & PLAN:  1. Chronic systolic CHF: Cardiomyopathy of uncertain etiology. No FH cardiomyopathy,  negative HIV and normal TSH, no recent pregnancy. She had been drinking heavily for several months prior to diagnosis, ETOH may contribute to her cardiomyopathy. Uncontrolled HTN likely plays a role. LHC 04/2019 w/ no CAD, filling pressures optimized and preserved cardiac output. Cardiac MRI showed no myocardial LGE, so no definitive evidence for prior MI, infiltrative disease, or myocarditis. Echo today showed some improvement in EF to 30-35%.  Now doing better symptomatically, NYHA class I-II symptoms.  Not volume overloaded.  She has quit ETOH. BP remains high.  - Stay off ETOH.  - Increase Entresto to 97/103 bid, BMET today and again in 10 days.  -  Continue spironolactone 25 mg daily.  - Increase Coreg to 12.5 mg bid.  - Continue Corlanor 5 mg bid.  - We discussed importance of avoiding pregnancy given risk of fetotoxicity w/ HF meds. She reports she is compliant w/ OCPs.  - Narrow QRS, not CRT candidate.  With some improvement in EF and nonischemic etiology, will continue medication titration and work on BP control prior to ICD implantation. Would repeat echo in 3-4 months.  2. HTN: BP remains high. - Increase Entresto and Coreg as above.  - Check renal artery dopplers to look for renal artery stenosis (would be related to fibromuscular dysplasia if she has RAS at her age).  3. ETOH abuse: She has quit ETOH, congratulated her.  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   F/u with HF pharmacist in 3 wks for medication titration.  I will see her in 2 months.    Loralie Champagne, MD 09/17/19

## 2019-09-17 NOTE — Progress Notes (Signed)
  Echocardiogram 2D Echocardiogram has been performed.  Tina Fletcher 09/17/2019, 10:58 AM

## 2019-09-24 ENCOUNTER — Ambulatory Visit (INDEPENDENT_AMBULATORY_CARE_PROVIDER_SITE_OTHER): Payer: Medicaid Other | Admitting: Women's Health

## 2019-09-24 ENCOUNTER — Encounter: Payer: Self-pay | Admitting: Women's Health

## 2019-09-24 ENCOUNTER — Other Ambulatory Visit: Payer: Self-pay

## 2019-09-24 VITALS — BP 148/96 | HR 74 | Ht 69.0 in | Wt 131.6 lb

## 2019-09-24 DIAGNOSIS — Z3046 Encounter for surveillance of implantable subdermal contraceptive: Secondary | ICD-10-CM

## 2019-09-24 DIAGNOSIS — Z30017 Encounter for initial prescription of implantable subdermal contraceptive: Secondary | ICD-10-CM

## 2019-09-24 DIAGNOSIS — Z3202 Encounter for pregnancy test, result negative: Secondary | ICD-10-CM

## 2019-09-24 LAB — POCT URINE PREGNANCY: Preg Test, Ur: NEGATIVE

## 2019-09-24 MED ORDER — ETONOGESTREL 68 MG ~~LOC~~ IMPL
68.0000 mg | DRUG_IMPLANT | Freq: Once | SUBCUTANEOUS | Status: AC
  Administered 2019-09-24: 68 mg via SUBCUTANEOUS

## 2019-09-24 NOTE — Progress Notes (Signed)
   NEXPLANON REMOVAL AND RE-INSERTION Patient name: Tina Fletcher MRN [id number]  Date of birth: [birth year] Subjective Findings:   Tina Fletcher is a 34 y.o. G63P2002 African American female being seen today for Nexplanon removal and re-insertion. Her Nexplanon was placed 09/21/16. Had acute CHF w/ hospitalization in Nov. Sees cardiologist regularly. Was told not to have any more children.   Patient's last menstrual period was 09/10/2019 (exact date). Last pap Oct 2020 @ [facility] Results were:  normal  Risks/benefits/side effects of Nexplanon have been discussed and her questions have been answered.  Specifically, a failure rate of 06/998 has been reported, with an increased failure rate if pt takes St. John's Wort and/or antiseizure medicaitons.  She is aware of the common side effect of irregular bleeding, which the incidence of decreases over time. Signed copy of informed consent in chart.  Pertinent History Reviewed:   Reviewed past medical,surgical, social, obstetrical and family history.  Reviewed problem list, medications and allergies. Objective Findings & Procedure:    Vitals:   09/24/19 0955  BP: (!) 148/96  Pulse: 74  Weight: 131 lb 9.6 oz (59.7 kg)  Height: 5\' 9"  (1.753 m)  Body mass index is 19.43 kg/m.  Results for orders placed or performed in visit on 09/24/19 (from the past 24 hour(s))  POCT urine pregnancy   Collection Time: 09/24/19  9:56 AM  Result Value Ref Range   Preg Test, Ur Negative Negative     Time out was performed.  Nexplanon site identified.  Area prepped in usual sterile fashon. Two cc's of 2% lidocaine was used to anesthetize the area. A small stab incision was made right beside the implant on the distal portion.  The Nexplanon rod was grasped using hemostats and removed intact without difficulty.  The area was cleansed again with betadine and the Nexplanon was inserted approximately 10cm from the medial epicondyle and 3-5cm posterior to the  sulcus per manufacturer's recommendations without difficulty.  Steri-strips and a pressure bandage was applied.  There was less than 3 cc blood loss. There were no complications.  The patient tolerated the procedure well. Assessment & Plan:   1) Nexplanon removal & re-insertion She was instructed to keep the area clean and dry, remove pressure bandage in 24 hours, and keep insertion site covered with the steri-strips for 3-5 days.  She was given a card indicating date Nexplanon was inserted and date it needs to be removed.  Follow-up PRN problems.  Orders Placed This Encounter  Procedures  . POCT urine pregnancy    Follow-up: Return for prn; get pap records from [facility] please.  [facility] CNM, [facility] 09/24/2019 10:23 AM

## 2019-09-24 NOTE — Patient Instructions (Signed)
Keep the area clean and dry.  You can remove the big bandage in 24 hours, and the small steri-strip bandage in 3-5 days.  A back up method, such as condoms, should be used for two weeks. You may have irregular vaginal bleeding for the first 6 months after the Nexplanon is placed, then the bleeding usually lightens and it is possible that you may not have any periods.  If you have any concerns, please give us a call.    Etonogestrel implant What is this medicine? ETONOGESTREL (et oh noe JES trel) is a contraceptive (birth control) device. It is used to prevent pregnancy. It can be used for up to 3 years. This medicine may be used for other purposes; ask your health care provider or pharmacist if you have questions. COMMON BRAND NAME(S): Implanon, Nexplanon What should I tell my health care provider before I take this medicine? They need to know if you have any of these conditions:  abnormal vaginal bleeding  blood vessel disease or blood clots  breast, cervical, endometrial, ovarian, liver, or uterine cancer  diabetes  gallbladder disease  heart disease or recent heart attack  high blood pressure  high cholesterol or triglycerides  kidney disease  liver disease  migraine headaches  seizures  stroke  tobacco smoker  an unusual or allergic reaction to etonogestrel, anesthetics or antiseptics, other medicines, foods, dyes, or preservatives  pregnant or trying to get pregnant  breast-feeding How should I use this medicine? This device is inserted just under the skin on the inner side of your upper arm by a health care professional. Talk to your pediatrician regarding the use of this medicine in children. Special care may be needed. Overdosage: If you think you have taken too much of this medicine contact a poison control center or emergency room at once. NOTE: This medicine is only for you. Do not share this medicine with others. What if I miss a dose? This does not  apply. What may interact with this medicine? Do not take this medicine with any of the following medications:  amprenavir  fosamprenavir This medicine may also interact with the following medications:  acitretin  aprepitant  armodafinil  bexarotene  bosentan  carbamazepine  certain medicines for fungal infections like fluconazole, ketoconazole, itraconazole and voriconazole  certain medicines to treat hepatitis, HIV or AIDS  cyclosporine  felbamate  griseofulvin  lamotrigine  modafinil  oxcarbazepine  phenobarbital  phenytoin  primidone  rifabutin  rifampin  rifapentine  St. John's wort  topiramate This list may not describe all possible interactions. Give your health care provider a list of all the medicines, herbs, non-prescription drugs, or dietary supplements you use. Also tell them if you smoke, drink alcohol, or use illegal drugs. Some items may interact with your medicine. What should I watch for while using this medicine? This product does not protect you against HIV infection (AIDS) or other sexually transmitted diseases. You should be able to feel the implant by pressing your fingertips over the skin where it was inserted. Contact your doctor if you cannot feel the implant, and use a non-hormonal birth control method (such as condoms) until your doctor confirms that the implant is in place. Contact your doctor if you think that the implant may have broken or become bent while in your arm. You will receive a user card from your health care provider after the implant is inserted. The card is a record of the location of the implant in your upper arm   and when it should be removed. Keep this card with your health records. What side effects may I notice from receiving this medicine? Side effects that you should report to your doctor or health care professional as soon as possible:  allergic reactions like skin rash, itching or hives, swelling of the  face, lips, or tongue  breast lumps, breast tissue changes, or discharge  breathing problems  changes in emotions or moods  coughing up blood  if you feel that the implant may have broken or bent while in your arm  high blood pressure  pain, irritation, swelling, or bruising at the insertion site  scar at site of insertion  signs of infection at the insertion site such as fever, and skin redness, pain or discharge  signs and symptoms of a blood clot such as breathing problems; changes in vision; chest pain; severe, sudden headache; pain, swelling, warmth in the leg; trouble speaking; sudden numbness or weakness of the face, arm or leg  signs and symptoms of liver injury like dark yellow or brown urine; general ill feeling or flu-like symptoms; light-colored stools; loss of appetite; nausea; right upper belly pain; unusually weak or tired; yellowing of the eyes or skin  unusual vaginal bleeding, discharge Side effects that usually do not require medical attention (report to your doctor or health care professional if they continue or are bothersome):  acne  breast pain or tenderness  headache  irregular menstrual bleeding  nausea This list may not describe all possible side effects. Call your doctor for medical advice about side effects. You may report side effects to FDA at 1-800-FDA-1088. Where should I keep my medicine? This drug is given in a hospital or clinic and will not be stored at home. NOTE: This sheet is a summary. It may not cover all possible information. If you have questions about this medicine, talk to your doctor, pharmacist, or health care provider.  2020 Elsevier/Gold Standard (2019-03-27 11:33:04)  

## 2019-09-25 ENCOUNTER — Ambulatory Visit (HOSPITAL_COMMUNITY)
Admission: RE | Admit: 2019-09-25 | Discharge: 2019-09-25 | Disposition: A | Discharge status: Home / Self Care | Payer: Medicaid Other | Source: Ambulatory Visit | Attending: Cardiovascular Disease | Admitting: Cardiovascular Disease

## 2019-09-25 DIAGNOSIS — I701 Atherosclerosis of renal artery: Secondary | ICD-10-CM | POA: Diagnosis present

## 2019-09-27 ENCOUNTER — Ambulatory Visit (HOSPITAL_COMMUNITY)
Admission: RE | Admit: 2019-09-27 | Discharge: 2019-09-27 | Disposition: A | Discharge status: Home / Self Care | Payer: Medicaid Other | Source: Ambulatory Visit | Attending: Internal Medicine | Admitting: Internal Medicine

## 2019-09-27 ENCOUNTER — Other Ambulatory Visit: Payer: Self-pay

## 2019-09-27 DIAGNOSIS — I5021 Acute systolic (congestive) heart failure: Secondary | ICD-10-CM | POA: Diagnosis not present

## 2019-09-27 LAB — BASIC METABOLIC PANEL
Anion gap: 11 (ref 5–15)
BUN: 9 mg/dL (ref 6–20)
CO2: 23 mmol/L (ref 22–32)
Calcium: 9.1 mg/dL (ref 8.9–10.3)
Chloride: 105 mmol/L (ref 98–111)
Creatinine, Ser: 0.89 mg/dL (ref 0.44–1.00)
GFR calc Af Amer: >60 mL/min (ref >60)
GFR calc non Af Amer: >60 mL/min (ref >60)
Glucose, Bld: 111 mg/dL — ABNORMAL HIGH (ref 70–99)
Potassium: 3.4 mmol/L — ABNORMAL LOW (ref 3.5–5.1)
Sodium: 139 mmol/L (ref 135–145)

## 2019-10-11 ENCOUNTER — Inpatient Hospital Stay (HOSPITAL_COMMUNITY): Admission: RE | Admit: 2019-10-11 | Payer: Medicaid Other | Source: Ambulatory Visit

## 2019-10-23 ENCOUNTER — Encounter: Payer: Self-pay | Admitting: Women's Health

## 2019-10-23 DIAGNOSIS — Z124 Encounter for screening for malignant neoplasm of cervix: Secondary | ICD-10-CM | POA: Insufficient documentation

## 2019-11-19 ENCOUNTER — Encounter (HOSPITAL_COMMUNITY): Payer: Medicaid Other | Admitting: Cardiology

## 2019-12-09 ENCOUNTER — Other Ambulatory Visit: Payer: Self-pay | Admitting: Cardiology

## 2020-01-01 NOTE — Progress Notes (Signed)
Referring Physician: PCP: Merlyn Albert, MD PCP-Cardiologist: Dr. Shirlee Latch    HPI:  34 y.o. female with past history of HTN presented to [facility] on 05/10/19 with cough, SOB, LE swelling, orthopnea, bendopnea and 20 lb weight gain over 3 months. She had a history of increased alcohol use over the several months prior with 7-8 airplane bottles of liquor 3-4 days per week. BNP was elevated and CTA showed pulmonary edema. Echo showed new onset systolic HF with EF 15% and decreased RV systolic function. Cardiology was consulted and she was transferred to [facility].   While admitted she was diuresed with IV furosemide. Right and Left heart cath were done. LHC showed no significant coronary artery disease and RHC was within normal limits. Her cardiomyopathy is possibly secondary to recent increased alcohol use or uncontrolled HTN. Viral myocarditis was felt to be a possible etiology with recent cough, however cMRI showed no myocardial LGE, so no definitive evidence for prior MI, infiltrative disease, or myocarditis.  Also of note, she has no family history of cardiomyopathy, negative HIV, and TSH was normal.  Echo was completed 09/17/19, EF 30-35%, global hypokinesis, mild RV dilation with normal function, mild MR.   Returned to HF Clinic for follow-up on 09/17/19 with Dr. Shirlee Latch.  She was drinking ETOH only very rarely, corroborated by her mother.  She was on contraception.  BP was still very high in clinic.  She stated that she took all her medications.  She reported feeling good overall.  No significant exertional dyspnea. Not fatigued.  Reported being able to walk around stores with her mother (was not able to do this a couple of months ago).  No orthopnea/PND.  No chest pain.  No lightheadedness.   Today she returns to HF clinic for pharmacist medication titration. At last visit with MD, Sherryll Burger was increased to 97/103 mg BID and carvedilol was increased to 12.5 mg BID. Overall she is doing well today.  Her main complaints is increased fatigue since increasing the carvedilol. She also has had some back pain recently, which she attributes to her most recent Nexplanon implant. No dizziness, lightheadedness, chest pain or palpitations. No SOB/DOE. She tries to stay active by walking and playing with her kids outside. Her weight has been stable at home at 132 lbs. Has not needed any PRN furosemide. No LEE, PND or orthopnea. Is not following a low salt diet.    HF Medications: Carvedilol 12.5 mg BID Entresto 97/103 mg BID Spironolactone 25 mg daily Ivabradine 5 mg BID Potassium 20 mEq daily  Has the patient been experiencing any side effects to the medications prescribed?  Notes some fatigue since increasing carvedilol.   Does the patient have any problems obtaining medications due to transportation or finances?   No - has [place] Medicaid [facility])  Understanding of regimen: good Understanding of indications: good Potential of compliance: fair Patient understands to avoid NSAIDs. Patient understands to avoid decongestants.    Pertinent Lab Values (09/27/19): Marland Kitchen Serum creatinine 0.89, BUN 9, Potassium 3.4, Sodium 139   Vital Signs: . Weight: 128.4 lbs (last clinic weight: 131.6 lbs) . Blood pressure: 160/92. Stated she took her morning medications but did note that she missed her medications last night.  . Heart rate: 65   Assessment: 1. Chronic systolic CHF: Cardiomyopathy of uncertain etiology. No FH cardiomyopathy, negative HIV and normal TSH, no recent pregnancy. She had been drinking heavily for several months prior to diagnosis, ETOH may contribute to her cardiomyopathy. Uncontrolled HTN likely  plays a role. LHC 04/2019 with no CAD, filling pressures optimized and preserved cardiac output. Cardiac MRI showed no myocardial LGE, so no definitive evidence for prior MI, infiltrative disease, or myocarditis. Echo 09/17/19 showed some improvement in EF to 30-35%.  Now doing better  symptomatically.  -NYHA class I-II symptoms.  Not volume overloaded.  She has quit ETOH. BP remains high today at 160/92.  - Stay off ETOH.  - Continue carvedilol 12.5 mg BID.  - Continue Entresto 97/103 mg BID. - Continue spironolactone 25 mg daily.  - Continue Corlanor 5 mg BID.  - Start Bidil 20-37.5 mg 1 tablet TID. - We discussed importance of avoiding pregnancy given risk of fetotoxicity w/ HF meds. She now has Nexplanon implant. - Narrow QRS, not CRT candidate.  With some improvement in EF and nonischemic etiology, will continue medication titration and work on BP control prior to ICD implantation. Would repeat echo in 3-4 months.  2. HTN: BP remains high. - Start Bidil 20-37.5 1 tablet TID as above - Continue carvedilol, Entresto and spironolactone as above.  - Discussed better adherence to low salt diet.  - Check renal artery dopplers to look for renal artery stenosis (would be related to fibromuscular dysplasia if she has RAS at her age).  3. ETOH abuse: She has quit ETOH, congratulated her.  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    Plan: 1) Medication changes: Based on clinical presentation, vital signs and recent labs will start Bidil 20-37.5 1 tablet TID. 3) Follow-up: 2 months with Dr. Jonn Shingles, PharmD, BCPS, [facility], CPP Heart Failure Clinic Pharmacist [phone]

## 2020-01-14 ENCOUNTER — Ambulatory Visit (HOSPITAL_COMMUNITY)
Admission: RE | Admit: 2020-01-14 | Discharge: 2020-01-14 | Disposition: A | Discharge status: Home / Self Care | Payer: Medicaid Other | Source: Ambulatory Visit | Attending: Internal Medicine | Admitting: Internal Medicine

## 2020-01-14 ENCOUNTER — Other Ambulatory Visit: Payer: Self-pay

## 2020-01-14 VITALS — BP 160/92 | HR 65 | Wt 128.4 lb

## 2020-01-14 DIAGNOSIS — I110 Hypertensive heart disease with heart failure: Secondary | ICD-10-CM | POA: Insufficient documentation

## 2020-01-14 DIAGNOSIS — Z7901 Long term (current) use of anticoagulants: Secondary | ICD-10-CM | POA: Insufficient documentation

## 2020-01-14 DIAGNOSIS — Z79899 Other long term (current) drug therapy: Secondary | ICD-10-CM | POA: Insufficient documentation

## 2020-01-14 DIAGNOSIS — M549 Dorsalgia, unspecified: Secondary | ICD-10-CM | POA: Diagnosis not present

## 2020-01-14 DIAGNOSIS — I5022 Chronic systolic (congestive) heart failure: Secondary | ICD-10-CM | POA: Diagnosis not present

## 2020-01-14 DIAGNOSIS — R5383 Other fatigue: Secondary | ICD-10-CM | POA: Insufficient documentation

## 2020-01-14 DIAGNOSIS — R9431 Abnormal electrocardiogram [ECG] [EKG]: Secondary | ICD-10-CM | POA: Insufficient documentation

## 2020-01-14 DIAGNOSIS — I429 Cardiomyopathy, unspecified: Secondary | ICD-10-CM | POA: Insufficient documentation

## 2020-01-14 MED ORDER — BIDIL 20-37.5 MG PO TABS: 1.0000 | ORAL_TABLET | Freq: Three times a day (TID) | ORAL | 11 refills | Status: DC

## 2020-01-14 NOTE — Patient Instructions (Signed)
It was a pleasure seeing you today!  MEDICATIONS: -We are changing your medications today -Start Bidil 1 tablet three times daily -Call if you have questions about your medications.  NEXT APPOINTMENT: Return to clinic in 2 months with Dr. Shirlee Latch.  In general, to take care of your heart failure: -Limit your fluid intake to 2 Liters (half-gallon) per day.   -Limit your salt intake to ideally 2-3 grams (2000-3000 mg) per day. -Weigh yourself daily and record, and bring that "weight diary" to your next appointment.  (Weight gain of 2-3 pounds in 1 day typically means fluid weight.) -The medications for your heart are to help your heart and help you live longer.   -Please contact us before stopping any of your heart medications.  Call the clinic at [phone] with questions or to reschedule future appointments.

## 2020-02-27 ENCOUNTER — Other Ambulatory Visit: Payer: Self-pay

## 2020-02-27 ENCOUNTER — Ambulatory Visit
Admission: EM | Admit: 2020-02-27 | Discharge: 2020-02-27 | Disposition: A | Discharge status: Home / Self Care | Payer: Medicaid Other | Attending: Emergency Medicine | Admitting: Emergency Medicine

## 2020-02-27 DIAGNOSIS — Z1152 Encounter for screening for COVID-19: Secondary | ICD-10-CM

## 2020-02-27 NOTE — ED Triage Notes (Signed)
covid exposure ---- no symptoms  

## 2020-02-29 LAB — NOVEL CORONAVIRUS, NAA: SARS-CoV-2, NAA: DETECTED — AB

## 2020-03-01 ENCOUNTER — Encounter: Payer: Self-pay | Admitting: Nurse Practitioner

## 2020-03-01 ENCOUNTER — Telehealth: Payer: Self-pay | Admitting: Family

## 2020-03-01 ENCOUNTER — Other Ambulatory Visit (HOSPITAL_COMMUNITY): Payer: Self-pay | Admitting: Nurse Practitioner

## 2020-03-01 DIAGNOSIS — U071 COVID-19: Secondary | ICD-10-CM

## 2020-03-01 NOTE — Progress Notes (Signed)
I connected by phone with Tina Fletcher on 03/01/2020 at 3:54 PM to discuss the potential use of an new treatment for mild to moderate COVID-19 viral infection in non-hospitalized patients.  This patient is a 34 y.o. female that meets the FDA criteria for Emergency Use Authorization of casirivimab\imdevimab.  Has a (+) direct SARS-CoV-2 viral test result  Has mild or moderate COVID-19   Is ? 34 years of age and weighs ? 40 kg  Is NOT hospitalized due to COVID-19  Is NOT requiring oxygen therapy or requiring an increase in baseline oxygen flow rate due to COVID-19  Is within 10 days of symptom onset  Has at least one of the high risk factor(s) for progression to severe COVID-19 and/or hospitalization as defined in EUA.  Specific high risk criteria : Cardiovascular disease or hypertension   I have spoken and communicated the following to the patient or parent/caregiver:  1. FDA has authorized the emergency use of casirivimab\imdevimab for the treatment of mild to moderate COVID-19 in adults and pediatric patients with positive results of direct SARS-CoV-2 viral testing who are 87 years of age and older weighing at least 40 kg, and who are at high risk for progressing to severe COVID-19 and/or hospitalization.  2. The significant known and potential risks and benefits of casirivimab\imdevimab, and the extent to which such potential risks and benefits are unknown.  3. Information on available alternative treatments and the risks and benefits of those alternatives, including clinical trials.  4. Patients treated with casirivimab\imdevimab should continue to self-isolate and use infection control measures (e.g., wear mask, isolate, social distance, avoid sharing personal items, clean and disinfect "high touch" surfaces, and frequent handwashing) according to CDC guidelines.   5. The patient or parent/caregiver has the option to accept or refuse casirivimab\imdevimab .  After reviewing  this information with the patient, The patient agreed to proceed with receiving casirivimab\imdevimab infusion and will be provided a copy of the Fact sheet prior to receiving the infusion.[name], DNP, AGNP-C [phone] (Infusion Center Hotline)

## 2020-03-01 NOTE — Telephone Encounter (Signed)
Called to Discuss with patient about Covid symptoms and the use of the monoclonal antibody infusion for those with mild to moderate Covid symptoms and at a high risk of hospitalization.     Pt appears to qualify for this infusion due to co-morbid conditions and/or a member of an at-risk group in accordance with the FDA Emergency Use Authorization.   Tina Fletcher tested positive for COVID on 9/1. Risk factors include cardiovascular disease and hypertension.  I was unable to make contact with [name] and left a message and MyChart.  Marcos Eke, NP 03/01/2020 12:03 PM

## 2020-03-03 ENCOUNTER — Ambulatory Visit (HOSPITAL_COMMUNITY)
Admission: RE | Admit: 2020-03-03 | Discharge: 2020-03-03 | Disposition: A | Discharge status: Home / Self Care | Payer: Medicaid Other | Source: Ambulatory Visit | Attending: Pulmonary Disease | Admitting: Pulmonary Disease

## 2020-03-03 DIAGNOSIS — U071 COVID-19: Secondary | ICD-10-CM | POA: Diagnosis not present

## 2020-03-03 MED ORDER — ALBUTEROL SULFATE HFA 108 (90 BASE) MCG/ACT IN AERS: 2.0000 | INHALATION_SPRAY | Freq: Once | RESPIRATORY_TRACT | Status: DC | PRN

## 2020-03-03 MED ORDER — DIPHENHYDRAMINE HCL 50 MG/ML IJ SOLN: 50.0000 mg | Freq: Once | INTRAMUSCULAR | Status: DC | PRN

## 2020-03-03 MED ORDER — SODIUM CHLORIDE 0.9 % IV SOLN
1200.0000 mg | Freq: Once | INTRAVENOUS | Status: AC
  Administered 2020-03-03: 1200 mg via INTRAVENOUS

## 2020-03-03 MED ORDER — FAMOTIDINE IN NACL 20-0.9 MG/50ML-% IV SOLN: 20.0000 mg | Freq: Once | INTRAVENOUS | Status: DC | PRN

## 2020-03-03 MED ORDER — EPINEPHRINE 0.3 MG/0.3ML IJ SOAJ: 0.3000 mg | Freq: Once | INTRAMUSCULAR | Status: DC | PRN

## 2020-03-03 MED ORDER — METHYLPREDNISOLONE SODIUM SUCC 125 MG IJ SOLR: 125.0000 mg | Freq: Once | INTRAMUSCULAR | Status: DC | PRN

## 2020-03-03 MED ORDER — SODIUM CHLORIDE 0.9 % IV SOLN: INTRAVENOUS | Status: DC | PRN

## 2020-03-03 NOTE — Discharge Instructions (Signed)

## 2020-03-03 NOTE — Progress Notes (Signed)
  Diagnosis: COVID-19  Physician: Dr. Wright  Procedure: Covid Infusion Clinic Med: casirivimab\imdevimab infusion - Provided patient with casirivimab\imdevimab fact sheet for patients, parents and caregivers prior to infusion.  Complications: No immediate complications noted.  Discharge: Discharged home   Rusti Arizmendi Ann 03/03/2020  

## 2020-03-07 ENCOUNTER — Encounter (HOSPITAL_COMMUNITY): Payer: Medicaid Other | Admitting: Cardiology

## 2020-03-11 ENCOUNTER — Other Ambulatory Visit: Payer: Self-pay

## 2020-03-11 ENCOUNTER — Other Ambulatory Visit: Payer: Medicaid Other

## 2020-03-11 ENCOUNTER — Other Ambulatory Visit: Payer: Self-pay | Admitting: *Deleted

## 2020-03-11 DIAGNOSIS — Z20822 Contact with and (suspected) exposure to covid-19: Secondary | ICD-10-CM

## 2020-03-13 LAB — SARS-COV-2, NAA 2 DAY TAT

## 2020-03-13 LAB — SPECIMEN STATUS REPORT

## 2020-03-13 LAB — NOVEL CORONAVIRUS, NAA: SARS-CoV-2, NAA: NOT DETECTED

## 2020-04-28 ENCOUNTER — Encounter (HOSPITAL_COMMUNITY): Payer: Medicaid Other | Admitting: Cardiology

## 2020-05-02 ENCOUNTER — Other Ambulatory Visit (HOSPITAL_COMMUNITY): Payer: Self-pay | Admitting: Cardiology

## 2020-06-04 ENCOUNTER — Other Ambulatory Visit: Payer: Self-pay | Admitting: Cardiology

## 2020-06-05 ENCOUNTER — Telehealth (HOSPITAL_COMMUNITY): Payer: Self-pay | Admitting: Cardiology

## 2020-06-05 ENCOUNTER — Other Ambulatory Visit: Payer: Self-pay

## 2020-06-05 ENCOUNTER — Emergency Department (HOSPITAL_COMMUNITY)
Admission: EM | Admit: 2020-06-05 | Discharge: 2020-06-05 | Disposition: A | Discharge status: Home / Self Care | Payer: Medicaid Other | Attending: Emergency Medicine | Admitting: Emergency Medicine

## 2020-06-05 ENCOUNTER — Encounter (HOSPITAL_COMMUNITY): Payer: Self-pay | Admitting: Emergency Medicine

## 2020-06-05 DIAGNOSIS — I110 Hypertensive heart disease with heart failure: Secondary | ICD-10-CM | POA: Insufficient documentation

## 2020-06-05 DIAGNOSIS — I5021 Acute systolic (congestive) heart failure: Secondary | ICD-10-CM | POA: Insufficient documentation

## 2020-06-05 DIAGNOSIS — R55 Syncope and collapse: Secondary | ICD-10-CM | POA: Insufficient documentation

## 2020-06-05 DIAGNOSIS — R42 Dizziness and giddiness: Secondary | ICD-10-CM | POA: Diagnosis present

## 2020-06-05 MED ORDER — SPIRONOLACTONE 25 MG PO TABS
ORAL_TABLET | ORAL | 4 refills | Status: DC
Start: 2020-06-05 — End: 2020-06-10

## 2020-06-05 MED ORDER — SPIRONOLACTONE 25 MG PO TABS
ORAL_TABLET | ORAL | 4 refills | Status: DC
Start: 2020-06-05 — End: 2020-06-05

## 2020-06-05 NOTE — Addendum Note (Signed)
Addended by: Modesta Messing on: 06/05/2020 10:57 AM   Modules accepted: Orders

## 2020-06-05 NOTE — ED Notes (Signed)
Pt left without telling registration

## 2020-06-05 NOTE — ED Triage Notes (Signed)
Pt was just brought back to ER by EMS for reported syncopal episodes. Pt left ER earlier because of wait. Pt states she is anxious. Reports eating edible THC gummy around 1730.

## 2020-06-05 NOTE — ED Triage Notes (Signed)
Pt reports eating a THC gummy given to her by her cousin. Pt states feeling dizzy.

## 2020-06-05 NOTE — Telephone Encounter (Signed)
Pt request spironolactone 25 MG refill, please send script to [facility], pt can be reached on cell if needed, pt r/s appt. [phone]. Thanks

## 2020-06-06 ENCOUNTER — Other Ambulatory Visit: Payer: Self-pay

## 2020-06-06 ENCOUNTER — Emergency Department (HOSPITAL_COMMUNITY)
Admission: EM | Admit: 2020-06-06 | Discharge: 2020-06-06 | Disposition: A | Discharge status: Home / Self Care | Payer: Medicaid Other | Source: Home / Self Care | Attending: Emergency Medicine | Admitting: Emergency Medicine

## 2020-06-06 ENCOUNTER — Telehealth (HOSPITAL_COMMUNITY): Payer: Self-pay | Admitting: Vascular Surgery

## 2020-06-06 DIAGNOSIS — I10 Essential (primary) hypertension: Secondary | ICD-10-CM

## 2020-06-06 DIAGNOSIS — R55 Syncope and collapse: Secondary | ICD-10-CM

## 2020-06-06 LAB — TROPONIN I (HIGH SENSITIVITY)
Troponin I (High Sensitivity): 9 ng/L (ref <18)
Troponin I (High Sensitivity): 7 ng/L (ref <18)

## 2020-06-06 LAB — BASIC METABOLIC PANEL
Anion gap: 9 (ref 5–15)
BUN: 8 mg/dL (ref 6–20)
CO2: 23 mmol/L (ref 22–32)
Calcium: 9.1 mg/dL (ref 8.9–10.3)
Chloride: 103 mmol/L (ref 98–111)
Creatinine, Ser: 0.67 mg/dL (ref 0.44–1.00)
GFR, Estimated: >60 mL/min (ref >60)
Glucose, Bld: 116 mg/dL — ABNORMAL HIGH (ref 70–99)
Potassium: 3.7 mmol/L (ref 3.5–5.1)
Sodium: 135 mmol/L (ref 135–145)

## 2020-06-06 LAB — CBC WITH DIFFERENTIAL/PLATELET
Abs Immature Granulocytes: 0.01 10*3/uL (ref 0.00–0.07)
Basophils Absolute: 0 10*3/uL (ref 0.0–0.1)
Basophils Relative: 1 %
Eosinophils Absolute: 0 10*3/uL (ref 0.0–0.5)
Eosinophils Relative: 0 %
HCT: 36.7 % (ref 36.0–46.0)
Hemoglobin: 12.2 g/dL (ref 12.0–15.0)
Immature Granulocytes: 0 %
Lymphocytes Relative: 24 %
Lymphs Abs: 1.2 10*3/uL (ref 0.7–4.0)
MCH: 30.3 pg (ref 26.0–34.0)
MCHC: 33.2 g/dL (ref 30.0–36.0)
MCV: 91.1 fL (ref 80.0–100.0)
Monocytes Absolute: 0.5 10*3/uL (ref 0.1–1.0)
Monocytes Relative: 10 %
Neutro Abs: 3.3 10*3/uL (ref 1.7–7.7)
Neutrophils Relative %: 65 %
Platelets: 192 10*3/uL (ref 150–400)
RBC: 4.03 MIL/uL (ref 3.87–5.11)
RDW: 13.1 % (ref 11.5–15.5)
WBC: 5 10*3/uL (ref 4.0–10.5)
nRBC: 0 % (ref 0.0–0.2)

## 2020-06-06 LAB — D-DIMER, QUANTITATIVE: D-Dimer, Quant: <0.27 ug/mL-FEU (ref 0.00–0.50)

## 2020-06-06 LAB — BRAIN NATRIURETIC PEPTIDE: B Natriuretic Peptide: 145 pg/mL — ABNORMAL HIGH (ref 0.0–100.0)

## 2020-06-06 MED ORDER — HYDRALAZINE HCL 25 MG PO TABS
50.0000 mg | ORAL_TABLET | Freq: Once | ORAL | Status: AC
  Administered 2020-06-06: 50 mg via ORAL
  Filled 2020-06-06: qty 2

## 2020-06-06 NOTE — ED Provider Notes (Signed)
[facility] EMERGENCY DEPARTMENT Provider Note   CSN: [id number] Arrival date & time: 06/05/20  2252     History Chief Complaint  Patient presents with  . Hypertension    Tina Fletcher is a 34 y.o. female.  Patient comes to the ER because she started to feel anxious, dizzy after eating a CBD gummy.  She came to the ER initially and then left without being seen because of wait times.  She went home and then had a syncopal episode.  She comes back to the ER by ambulance.  Patient reports that she is very anxious.  She is not experiencing any chest pain.  She is noted to be hypertensive.  She has a history of hypertension, has been out of some of her medications for 3 days.        Past Medical History:  Diagnosis Date  . Abnormal menstrual periods 02/12/2014  . Herpes simplex without mention of complication   . Hypertension   . Medical history non-contributory   . Migraines   . Nexplanon insertion 08/13/2013   08/13/13 inserted left arm, remove 08/13/16  . Vitamin D deficiency 04/01/2016    Patient Active Problem List   Diagnosis Date Noted  . Cervical cancer screening 10/23/2019  . Systolic CHF, acute (HCC) 05/10/2019  . Vitamin D deficiency 04/01/2016  . Nexplanon insertion 08/13/2013  . Thrombocytopenia (HCC) 04/17/2013  . 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 Underweight 01/08/2013    Past Surgical History:  Procedure Laterality Date  . NO PAST SURGERIES    . RIGHT/LEFT HEART CATH AND CORONARY ANGIOGRAPHY N/A 05/14/2019   Procedure: RIGHT/LEFT HEART CATH AND CORONARY ANGIOGRAPHY;  Surgeon: Laurey Morale, MD;  Location: [facility] INVASIVE CV LAB;  Service: Cardiovascular;  Laterality: N/A;     OB History    Gravida  2   Para  2   Term  2   Preterm      AB      Living  2     SAB      IAB      Ectopic      Multiple      Live Births  2           Family History  Problem Relation Age of Onset  . Diabetes Maternal Grandmother   . Cancer Paternal  Grandfather        prancreatic  . COPD Maternal Grandfather   . Cancer Maternal Grandfather        prostate  . Asthma Maternal Grandfather   . Asthma Son   . Bronchitis Son     Social History   Tobacco Use  . Smoking status: Never Smoker  . Smokeless tobacco: Never Used  Vaping Use  . Vaping Use: Never used  Substance Use Topics  . Alcohol use: Yes    Comment: occ  . Drug use: No    Home Medications Prior to Admission medications   Medication Sig Start Date End Date Taking? Authorizing Provider  acetaminophen (TYLENOL) 325 MG tablet Take 650 mg by mouth every 6 (six) hours as needed.    [provider]  carvedilol (COREG) 12.5 MG tablet TAKE (1) TABLET BY MOUTH TWICE DAILY. 05/02/20   Laurey Morale, MD  CORLANOR 5 MG TABS tablet TAKE ONE TABLET BY MOUTH 2 TIMES A DAY WITH A MEAL 12/12/19   Robbie Lis M, PA-C  etonogestrel (NEXPLANON) 68 MG IMPL implant Inject 1 each into the skin once.  [provider]  isosorbide-hydrALAZINE (BIDIL) 20-37.5 MG tablet Take 1 tablet by mouth 3 (three) times daily. 01/14/20   Laurey Morale, MD  potassium chloride (KLOR-CON) 10 MEQ tablet Take 2 tablets (20 mEq total) by mouth daily. 08/02/19   Laurey Morale, MD  sacubitril-valsartan (ENTRESTO) 97-103 MG Take 1 tablet by mouth 2 (two) times daily. 09/17/19   Laurey Morale, MD  spironolactone (ALDACTONE) 25 MG tablet TAKE (1) TABLET BY MOUTH DAILY 06/05/20   Robbie Lis M, PA-C  traMADol (ULTRAM) 50 MG tablet Take by mouth every 6 (six) hours as needed.    [provider]    Allergies    Patient has no known allergies.  Review of Systems   Review of Systems  Respiratory: Negative for shortness of breath.   Cardiovascular: Negative for chest pain.  Neurological: Positive for dizziness and syncope.  Psychiatric/Behavioral: The patient is nervous/anxious.   All other systems reviewed and are negative.   Physical Exam Updated Vital Signs BP  (!) 137/100   Pulse 92   Temp 98.6 F (37 C) (Oral)   Resp 16   Ht 5\' 9"  (1.753 m)   Wt 61.2 kg   SpO2 100%   BMI 19.92 kg/m   Physical Exam Vitals and nursing note reviewed.  Constitutional:      General: She is not in acute distress.    Appearance: Normal appearance. She is well-developed and well-nourished.  HENT:     Head: Normocephalic and atraumatic.     Right Ear: Hearing normal.     Left Ear: Hearing normal.     Nose: Nose normal.     Mouth/Throat:     Mouth: Oropharynx is clear and moist and mucous membranes are normal.  Eyes:     Extraocular Movements: EOM normal.     Conjunctiva/sclera: Conjunctivae normal.     Pupils: Pupils are equal, round, and reactive to light.  Cardiovascular:     Rate and Rhythm: Regular rhythm. Tachycardia present.     Heart sounds: S1 normal and S2 normal. No murmur heard. No friction rub. No gallop.   Pulmonary:     Effort: Pulmonary effort is normal. No respiratory distress.     Breath sounds: Normal breath sounds.  Chest:     Chest wall: No tenderness.  Abdominal:     General: Bowel sounds are normal.     Palpations: Abdomen is soft. There is no hepatosplenomegaly.     Tenderness: There is no abdominal tenderness. There is no guarding or rebound. Negative signs include Murphy's sign and McBurney's sign.     Hernia: No hernia is present.  Musculoskeletal:        General: Normal range of motion.     Cervical back: Normal range of motion and neck supple.  Skin:    General: Skin is warm, dry and intact.     Findings: No rash.     Nails: There is no cyanosis.  Neurological:     Mental Status: She is alert and oriented to person, place, and time.     GCS: GCS eye subscore is 4. GCS verbal subscore is 5. GCS motor subscore is 6.     Cranial Nerves: No cranial nerve deficit.     Sensory: No sensory deficit.     Coordination: Coordination normal.     Deep Tendon Reflexes: Strength normal.  Psychiatric:        Mood and Affect: Mood  and affect normal.  Speech: Speech normal.        Behavior: Behavior normal.        Thought Content: Thought content normal.     ED Results / Procedures / Treatments   Labs (all labs ordered are listed, but only abnormal results are displayed) Labs Reviewed  BASIC METABOLIC PANEL - Abnormal; Notable for the following components:      Result Value   Glucose, Bld 116 (*)    All other components within normal limits  BRAIN NATRIURETIC PEPTIDE - Abnormal; Notable for the following components:   B Natriuretic Peptide 145.0 (*)    All other components within normal limits  CBC WITH DIFFERENTIAL/PLATELET  D-DIMER, QUANTITATIVE (NOT AT [facility])  TROPONIN I (HIGH SENSITIVITY)  TROPONIN I (HIGH SENSITIVITY)    EKG None  Radiology No results found.  Procedures Procedures (including critical care time)  Medications Ordered in ED Medications  hydrALAZINE (APRESOLINE) tablet 50 mg (50 mg Oral Given 06/06/20 0300)    ED Course  I have reviewed the triage vital signs and the nursing notes.  Pertinent labs & imaging results that were available during my care of the patient were reviewed by me and considered in my medical decision making (see chart for details).    MDM Rules/Calculators/A&P                          Patient presents to the emergency department for evaluation of syncopal episodes. Patient has a history of nonischemic cardiomyopathy with ejection fraction of 15%. She has been off of some of her medications for the last 3 days. She reports that she has called the pharmacy to fill but has not picked them up yet.  Patient had a reaction to CBD Gummies today. She showed up in the ER with dizziness and increased anxiety, left without being seen. When she got home she had a syncopal episode. Her work-up has been reassuring. No decompensated congestive heart failure. She was tachycardic but this is likely secondary to her anxiety. D-dimer is negative. Troponins are negative  x2. EKG without change from prior. Work-up very reassuring, patient counseled that she needs to take all of her medications as prescribed, avoid other drug use. Final Clinical Impression(s) / ED Diagnoses Final diagnoses:  Syncope, unspecified syncope type  Primary hypertension    Rx / DC Orders ED Discharge Orders    None       Lakeith Careaga, Canary Brim, MD 06/06/20 [phone]

## 2020-06-06 NOTE — Telephone Encounter (Signed)
Pt called states she was in ed last night 07/07/19 @ [facility] due to high bp and pt states she had a syncopal episode, she would like recommendation from Dr. Shirlee Latch on what to do next .. please advise

## 2020-06-06 NOTE — ED Notes (Signed)
Pt has been out of BP med for 3 days- Dr Blinda Leatherwood made aware.

## 2020-06-06 NOTE — Telephone Encounter (Signed)
See below, please advise.

## 2020-06-08 NOTE — Telephone Encounter (Signed)
Get appointment in APP clinic to assess.

## 2020-06-09 ENCOUNTER — Telehealth (HOSPITAL_COMMUNITY): Payer: Self-pay | Admitting: Vascular Surgery

## 2020-06-09 NOTE — Telephone Encounter (Signed)
She has a pending appt with you tomorrow

## 2020-06-09 NOTE — Telephone Encounter (Signed)
Left pt message top confirm f/u 12/14 @ 2:40 PM w/ [facility]

## 2020-06-10 ENCOUNTER — Other Ambulatory Visit: Payer: Self-pay

## 2020-06-10 ENCOUNTER — Ambulatory Visit (HOSPITAL_COMMUNITY)
Admission: RE | Admit: 2020-06-10 | Discharge: 2020-06-10 | Disposition: A | Discharge status: Home / Self Care | Payer: Medicaid Other | Source: Ambulatory Visit | Attending: Cardiology | Admitting: Cardiology

## 2020-06-10 ENCOUNTER — Encounter (HOSPITAL_COMMUNITY): Payer: Self-pay | Admitting: Cardiology

## 2020-06-10 VITALS — BP 140/100 | HR 67 | Wt 132.0 lb

## 2020-06-10 DIAGNOSIS — I110 Hypertensive heart disease with heart failure: Secondary | ICD-10-CM | POA: Diagnosis not present

## 2020-06-10 DIAGNOSIS — I5022 Chronic systolic (congestive) heart failure: Secondary | ICD-10-CM | POA: Diagnosis not present

## 2020-06-10 DIAGNOSIS — F1011 Alcohol abuse, in remission: Secondary | ICD-10-CM | POA: Diagnosis not present

## 2020-06-10 DIAGNOSIS — I429 Cardiomyopathy, unspecified: Secondary | ICD-10-CM | POA: Insufficient documentation

## 2020-06-10 DIAGNOSIS — R55 Syncope and collapse: Secondary | ICD-10-CM

## 2020-06-10 DIAGNOSIS — R9431 Abnormal electrocardiogram [ECG] [EKG]: Secondary | ICD-10-CM | POA: Insufficient documentation

## 2020-06-10 DIAGNOSIS — Z79899 Other long term (current) drug therapy: Secondary | ICD-10-CM | POA: Insufficient documentation

## 2020-06-10 DIAGNOSIS — Z8616 Personal history of COVID-19: Secondary | ICD-10-CM | POA: Insufficient documentation

## 2020-06-10 MED ORDER — SPIRONOLACTONE 50 MG PO TABS: ORAL_TABLET | ORAL | 3 refills | Status: DC

## 2020-06-10 MED ORDER — CARVEDILOL 12.5 MG PO TABS: 18.7500 mg | ORAL_TABLET | Freq: Two times a day (BID) | ORAL | 3 refills | Status: DC

## 2020-06-10 NOTE — Patient Instructions (Addendum)
Increase  Spironolactone to 50 mg daily  Increase Carvedilol to 18.75 mg (1&  1/2 tab) Twice daily  Your provider has recommended that  you wear a Zio Patch for 14 days.  This monitor will record your heart rhythm for our review.  IF you have any symptoms while wearing the monitor please press the button.  If you have any issues with the patch or you notice a red or orange light on it please call the company at [phone].  Once you remove the patch please mail it back to the company as soon as possible so we can get the results.  Your physician has requested that you have an echocardiogram. Echocardiography is a painless test that uses sound waves to create images of your heart. It provides your doctor with information about the size and shape of your heart and how well your heart's chambers and valves are working. This procedure takes approximately one hour. There are no restrictions for this procedure.  Follow up lab work in 10 days  Please follow up with our heart failure pharmacist in 3 weeks  Your physician recommends that you schedule a follow-up appointment in: 6 weeks     If you have any questions or concerns before your next appointment please send [place] a message through [place] or call our office at [phone].    TO LEAVE A MESSAGE FOR THE NURSE SELECT OPTION 2, PLEASE LEAVE A MESSAGE INCLUDING: . YOUR NAME . DATE OF BIRTH . CALL BACK NUMBER . REASON FOR CALL**this is important as we prioritize the call backs  YOU WILL RECEIVE A CALL BACK THE SAME DAY AS LONG AS YOU CALL BEFORE 4:00 PM  At the [facility], you and your health needs are our priority. As part of our continuing mission to provide you with exceptional heart care, we have created designated Provider Care Teams. These Care Teams include your primary Cardiologist (physician) and Advanced Practice Providers (APPs- Physician Assistants and Nurse Practitioners) who all work together to provide you  with the care you need, when you need it.   You may see any of the following providers on your designated Care Team at your next follow up: Marland Kitchen Dr Arvilla Meres . Dr Marca Ancona . Tonye Becket, NP . Robbie Lis, PA . Karle Plumber, PharmD   Please be sure to bring in all your medications bottles to every appointment.

## 2020-06-11 NOTE — Progress Notes (Signed)
[facility] Note   Referring Physician: PCP: Merlyn Albert, MD (Inactive) PCP-Cardiologist: Dr. Shirlee Latch    HPI: 34 y.o. female with past history of HTN presented to [facility] on 05/10/19 with cough, SOB, LE swelling, orthopnea, bendopnea and 20lb weight gain over 3 months. She had a history of increased alcohol use over the several months prior with 7-8 airplane bottles of liquor 3-4 days per week. BNP was elevated and CTA showed pulmonary edema. Echo showed new onset systolic HF with EF 15% and decreased RV systolic function. Cardiology was consulted and she was transferred to [facility].   While admitted she was diuresed with IV lasix. Right and Left heart cath were done. LHC showed no significant coronary artery disease and RHC was within normal limits. Her cardiomyopathy is possibly secondary to recent increased alcohol use or uncontrolled HTN. Viral myocarditis was felt to be a possible etiology with recent cough, however cMRI showed no myocardial LGE, so no definitive evidence for prior MI, infiltrative disease, or myocarditis.  Also of note, she has no family history of cardiomyopathy, negative HIV, and TSH was normal.   Echo in 3/21 showed EF 30-35%, global hypokinesis, mild RV dilation with normal function, mild MR.   On 06/06/20, she had a CBD gummy with a high dosage of CBD in it.  Shortly afterwards, she became lightheaded and dizzy and actually passed out.  She had been out of her meds for several days and her BP was elevated when she went to the ER.  HS-TnI and D dimer were normal. She was discharged home.   She returns today for followup of CHF.  She is now drinking ETOH only very rarely.  She is on contraception.   BP is still elevated today, taking all meds now.  No further lightheadedness or syncope.  Unable to tolerate Bidil due to headaches.  No significant exertional dyspnea.  No orthopnea/PND.   ECG (personally reviewed): sinus tachy 101, QTc 509 msec  Labs  (11/20): K 3.8, creatinine 1.1 Labs (2/21): K 3.6, creatinine 0.89 Labs (12/21): BNP 145, K 3.7, creatinine 0.67, hgb 12.2  PMH: 1. HTN - Renal artery dopplers (3/21): No evidence for renal artery stenosis.  2. ETOH abuse: Has quit.  3. Chronic systolic CHF: Nonischemic cardiomyopathy.  - LHC/RHC (11/20): No significant CAD; mean RA 1, PA 24/8, mean PCWP 5, CI 3.54.  - Echo (11/20): EF 15%, diffuse hypokinesis, mildly decreased RV systolic function.  - CMRI (11/20): LV mildly dilated with EF 16%, normal RV size with EF 28%, no myocardial LGE.  - Echo (3/21): EF 30-35%, global hypokinesis, mild RV dilation with normal function, mild MR.  4. COVID-19 infection 9/21 5. Prolonged QT interval  Review of Systems: All systems reviewed and negative except as per HPI.    Current Outpatient Medications  Medication Sig Dispense Refill  . acetaminophen (TYLENOL) 325 MG tablet Take 650 mg by mouth every 6 (six) hours as needed.    Marland Kitchen amoxicillin (AMOXIL) 500 MG capsule Take by mouth. Before dental procedure    . CORLANOR 5 MG TABS tablet TAKE ONE TABLET BY MOUTH 2 TIMES A DAY WITH A MEAL 60 tablet 4  . etonogestrel (NEXPLANON) 68 MG IMPL implant Inject 1 each into the skin once.    . isosorbide-hydrALAZINE (BIDIL) 20-37.5 MG tablet Take 1 tablet by mouth 3 (three) times daily. 90 tablet 11  . potassium chloride (KLOR-CON) 10 MEQ tablet Take 2 tablets (20 mEq total) by mouth daily.  60 tablet 3  . sacubitril-valsartan (ENTRESTO) 97-103 MG Take 1 tablet by mouth 2 (two) times daily. 60 tablet 5  . traMADol (ULTRAM) 50 MG tablet Take by mouth every 6 (six) hours as needed.    . carvedilol (COREG) 12.5 MG tablet Take 1.5 tablets (18.75 mg total) by mouth 2 (two) times daily with a meal. 45 tablet 3  . spironolactone (ALDACTONE) 50 MG tablet TAKE (1) TABLET BY MOUTH DAILY 30 tablet 3   No current facility-administered medications for this encounter.    No Known Allergies    Social History    Socioeconomic History  . Marital status: Single    Spouse name: Not on file  . Number of children: Not on file  . Years of education: Not on file  . Highest education level: Not on file  Occupational History  . Not on file  Tobacco Use  . Smoking status: Never Smoker  . Smokeless tobacco: Never Used  Vaping Use  . Vaping Use: Never used  Substance and Sexual Activity  . Alcohol use: Yes    Comment: occ  . Drug use: No  . Sexual activity: Yes    Birth control/protection: Implant  Other Topics Concern  . Not on file  Social History Narrative  . Not on file   Social Determinants of Health   Financial Resource Strain: Low Risk   . Difficulty of Paying Living Expenses: Not hard at all  Food Insecurity: No Food Insecurity  . Worried About Programme researcher, broadcasting/film/video in the Last Year: Never true  . Ran Out of Food in the Last Year: Never true  Transportation Needs: No Transportation Needs  . Lack of Transportation (Medical): No  . Lack of Transportation (Non-Medical): No  Physical Activity: Insufficiently Active  . Days of Exercise per Week: 1 day  . Minutes of Exercise per Session: 60 min  Stress: No Stress Concern Present  . Feeling of Stress : Not at all  Social Connections: Moderately Integrated  . Frequency of Communication with Friends and Family: More than three times a week  . Frequency of Social Gatherings with Friends and Family: More than three times a week  . Attends Religious Services: More than 4 times per year  . Active Member of Clubs or Organizations: No  . Attends Banker Meetings: Never  . Marital Status: Living with partner  Intimate Partner Violence: Not At Risk  . Fear of Current or Ex-Partner: No  . Emotionally Abused: No  . Physically Abused: No  . Sexually Abused: No      Family History  Problem Relation Age of Onset  . Diabetes Maternal Grandmother   . Cancer Paternal Grandfather        prancreatic  . COPD Maternal Grandfather    . Cancer Maternal Grandfather        prostate  . Asthma Maternal Grandfather   . Asthma Son   . Bronchitis Son     Vitals:   06/10/20 1454  BP: (!) 140/100  Pulse: 67  SpO2: 99%  Weight: 59.9 kg (132 lb)   PHYSICAL EXAM: General: NAD Neck: No JVD, no thyromegaly or thyroid nodule.  Lungs: Clear to auscultation bilaterally with normal respiratory effort. CV: Nondisplaced PMI.  Heart regular S1/S2, no S3/S4, no murmur.  No peripheral edema.  No carotid bruit.  Normal pedal pulses.  Abdomen: Soft, nontender, no hepatosplenomegaly, no distention.  Skin: Intact without lesions or rashes.  Neurologic: Alert and oriented x 3.  Psych: Normal affect. Extremities: No clubbing or cyanosis.  HEENT: Normal.   ASSESSMENT & PLAN:  1. Chronic systolic CHF: Cardiomyopathy of uncertain etiology. No FH cardiomyopathy, negative HIV and normal TSH, no recent pregnancy. She had been drinking heavily for several months prior to diagnosis, ETOH may contribute to her cardiomyopathy. Uncontrolled HTN likely plays a role. LHC 04/2019 w/ no CAD, filling pressures optimized and preserved cardiac output. Cardiac MRI showed no myocardial LGE, so no definitive evidence for prior MI, infiltrative disease, or myocarditis. Echo in 3/21 showed EF 30-35%.  Now doing better symptomatically, NYHA class I-II symptoms.  Not volume overloaded.  She has quit ETOH. BP remains high.  - Stay off ETOH.  - Continue Entresto 97/103 bid.  - Increase spironolactone to 50 mg daily with BMET in 10 days.   - Increase Coreg to 18.75 mg bid.   - Continue Corlanor 5 mg bid.  - Unable to tolerate Bidil due to headache.  - We discussed importance of avoiding pregnancy given risk of fetotoxicity w/ HF meds. She reports she is compliant w/ OCPs.  - Narrow QRS, not CRT candidate.  With syncopal episode, I will arrange for repeat echo.  Will need ICD if EF remains < 35%.  2. HTN: BP remains high. No evidence for fibromuscular dysplasia  on renal artery dopplers.  - Increase spironolactone and Coreg as above.  3. ETOH abuse: She has quit ETOH. 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  5. Syncope: Uncertain cause, may be related to high dose of CBD in gummy.  No recurrence.  - 14 day Zio patch, can repeat if no arrhythmias.  - Echo as above.   F/u with HF pharmacist in 3 wks for medication titration.  I will see her in 6 wks.     Marca Ancona, MD 06/11/20

## 2020-06-16 NOTE — Progress Notes (Signed)
Referring Physician: PCP: Merlyn Albert, MD PCP-Cardiologist: Dr. Shirlee Latch    HPI:  34 y.o. female with past history of HTN presented to [facility] on 05/10/19 with cough, SOB, LE swelling, orthopnea, bendopnea and 20 lb weight gain over 3 months. She had a history of increased alcohol use over the several months prior with 7-8 airplane bottles of liquor 3-4 days per week. BNP was elevated and CTA showed pulmonary edema. Echo showed new onset systolic HF with EF 15% and decreased RV systolic function. Cardiology was consulted and she was transferred to [facility].   While admitted she was diuresed with IV furosemide. Right and Left heart cath were done. LHC showed no significant coronary artery disease and RHC was within normal limits. Her cardiomyopathy is possibly secondary to recent increased alcohol use or uncontrolled HTN. Viral myocarditis was felt to be a possible etiology with recent cough, however cMRI showed no myocardial LGE, so no definitive evidence for prior MI, infiltrative disease, or myocarditis.  Also of note, she has no family history of cardiomyopathy, negative HIV, and TSH was normal.  Echo in 3/21 showed EF 30-35%, global hypokinesis, mild RV dilation with normal function, mild MR.   On 06/06/20, she had a CBD gummy with a high dosage of CBD in it.  Shortly afterwards, she became lightheaded and dizzy and actually passed out.  She had been out of her meds for several days and her BP was elevated when she went to the ER.  HS-TnI and D dimer were normal. She was discharged home.   Recently returned to HF Clinic for follow up on 06/10/20.  She reported drinking ETOH only very rarely.  She is on contraception.   BP was elevated in clinic, taking all meds now.  No further lightheadedness or syncope.  Unable to tolerate Bidil due to headaches.  No significant exertional dyspnea.  No orthopnea/PND.  Today she returns to HF clinic for pharmacist medication titration. At last visit with MD  carvedilol was increased to 18.75 mg BID and spironolactone was increased to 50 mg daily. 14 day Zio patch placed 06/10/20. Overall she is feeling well today. No dizziness, lightheadedness, chest pain or palpitations. No SOB/DOE. She is not taking any diuretics. No LEE, PND or orthopnea. Has improved medication compliance.    HF Medications: Carvedilol 18.75 mg BID Entresto 97/103 mg BID Spironolactone 50 mg daily Ivabradine 5 mg BID Potassium 20 mEq daily  Has the patient been experiencing any side effects to the medications prescribed?  No  Does the patient have any problems obtaining medications due to transportation or finances?   No - has [place] Medicaid [facility])  Understanding of regimen: good Understanding of indications: good Potential of compliance: fair Patient understands to avoid NSAIDs. Patient understands to avoid decongestants.    Pertinent Lab Values (06/06/20): Marland Kitchen Serum creatinine 0.67, BUN 8, Potassium 3.7, Sodium 135   Vital Signs: . Weight: 127.2 lbs (last clinic weight: 132 lbs) . Blood pressure: 130/86 . Heart rate: 61   Assessment: 1. Chronic systolic CHF: Cardiomyopathy of uncertain etiology. No FH cardiomyopathy, negative HIV and normal TSH, no recent pregnancy. She had been drinking heavily for several months prior to diagnosis, ETOH may contribute to her cardiomyopathy. Uncontrolled HTN likely plays a role. LHC 04/2019 w/ no CAD, filling pressures optimized and preserved cardiac output. Cardiac MRI showed no myocardial LGE, so no definitive evidence for prior MI, infiltrative disease, or myocarditis. Echo in 3/21 showed EF 30-35%.   - NYHA class  I-II symptoms.  Not volume overloaded. - Stay off ETOH.  - Increase carvedilol to 25 mg BID - Continue Entresto 97/103 mg BID.  - Continue spironolactone 50 mg daily   - Decrease Corlanor to 2.5 mg BID.  - Unable to tolerate Bidil due to headache.  - We discussed importance of avoiding pregnancy given risk of  fetotoxicity w/ HF meds. She reports she is compliant w/ Nexplanon.   - Narrow QRS, not CRT candidate.  With syncopal episode, will need repeat echo.  Will need ICD if EF remains < 35%.  - Echo today pending 2. HTN: BP improved. No evidence for fibromuscular dysplasia on renal artery dopplers.  -Increase carvedilol as above 3. ETOH abuse: She has quit ETOH. 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  5. Syncope: Uncertain cause, may be related to high dose of CBD in gummy.  No recurrence.  - 14 day Zio patch, can repeat if no arrhythmias.  - Echo as above.    Plan: 1) Medication changes: Based on clinical presentation, vital signs and recent labs will increase carvedilol to 25 mg BID and decrease Corlanor to 2.5 mg BID.  3) Follow-up: 1 month with Dr. Jonn Shingles, PharmD, BCPS, [facility], CPP Heart Failure Clinic Pharmacist [phone]

## 2020-07-01 ENCOUNTER — Ambulatory Visit (HOSPITAL_COMMUNITY)
Admission: RE | Admit: 2020-07-01 | Discharge: 2020-07-01 | Disposition: A | Discharge status: Home / Self Care | Payer: Medicaid Other | Source: Ambulatory Visit | Attending: Cardiology | Admitting: Cardiology

## 2020-07-01 ENCOUNTER — Other Ambulatory Visit: Payer: Self-pay

## 2020-07-01 DIAGNOSIS — I5022 Chronic systolic (congestive) heart failure: Secondary | ICD-10-CM | POA: Diagnosis present

## 2020-07-01 DIAGNOSIS — I429 Cardiomyopathy, unspecified: Secondary | ICD-10-CM | POA: Insufficient documentation

## 2020-07-01 DIAGNOSIS — I110 Hypertensive heart disease with heart failure: Secondary | ICD-10-CM | POA: Insufficient documentation

## 2020-07-01 DIAGNOSIS — R55 Syncope and collapse: Secondary | ICD-10-CM | POA: Insufficient documentation

## 2020-07-01 LAB — ECHOCARDIOGRAM COMPLETE
AR max vel: 2.36 cm2
AV Area VTI: 2.21 cm2
AV Area mean vel: 2.23 cm2
AV Mean grad: 4 mmHg
AV Peak grad: 7.5 mmHg
Ao pk vel: 1.37 m/s
Area-P 1/2: 2.8 cm2
Calc EF: 43.3 %
S' Lateral: 3.5 cm
Single Plane A2C EF: 54.8 %
Single Plane A4C EF: 29 %

## 2020-07-01 MED ORDER — IVABRADINE HCL 5 MG PO TABS: 2.5000 mg | ORAL_TABLET | Freq: Two times a day (BID) | ORAL | 3 refills | Status: DC

## 2020-07-01 MED ORDER — CARVEDILOL 25 MG PO TABS: 25.0000 mg | ORAL_TABLET | Freq: Two times a day (BID) | ORAL | 3 refills | Status: DC

## 2020-07-01 NOTE — Progress Notes (Signed)
  Echocardiogram 2D Echocardiogram has been performed.  Gerda Diss 07/01/2020, 12:21 PM

## 2020-07-01 NOTE — Patient Instructions (Addendum)
It was a pleasure seeing you today!  MEDICATIONS: -We are changing your medications today -Increase carvedilol to 25 mg (1 tablet) twice daily.  -Decrease Corlanor to 2.5 mg (1/2 tablet) twice daily -Call if you have questions about your medications.  LABS: -We will call you if your labs need attention.  NEXT APPOINTMENT: Return to clinic in 1 month with Dr. Shirlee Latch.  In general, to take care of your heart failure: -Limit your fluid intake to 2 Liters (half-gallon) per day.   -Limit your salt intake to ideally 2-3 grams (2000-3000 mg) per day. -Weigh yourself daily and record, and bring that "weight diary" to your next appointment.  (Weight gain of 2-3 pounds in 1 day typically means fluid weight.) -The medications for your heart are to help your heart and help you live longer.   -Please contact us before stopping any of your heart medications.  Call the clinic at [phone] with questions or to reschedule future appointments.

## 2020-07-03 ENCOUNTER — Telehealth (HOSPITAL_COMMUNITY): Payer: Self-pay

## 2020-07-03 NOTE — Telephone Encounter (Signed)
Samara Snide, RN  07/03/2020 2:10 PM EST Back to Top     Patient aware   Samara Snide, RN  07/02/2020 1:41 PM EST      Tried calling patient. No answer, unable to leave message. Will try again later

## 2020-07-03 NOTE — Telephone Encounter (Signed)
-----   Message from Laurey Morale, MD sent at 07/02/2020  1:36 PM EST ----- EF improved, 40-45%

## 2020-07-31 ENCOUNTER — Encounter (HOSPITAL_COMMUNITY): Payer: Medicaid Other | Admitting: Cardiology

## 2020-08-10 NOTE — Progress Notes (Incomplete)
Advanced Heart Failure Clinic Note   Referring Physician: PCP: Merlyn Albert, MD (Inactive) PCP-Cardiologist: Dr. Shirlee Latch    HPI: 35 y.o. female with past history of HTN presented to [facility] on 05/10/19 with cough, SOB, LE swelling, orthopnea, bendopnea and 20lb weight gain over 3 months. She had a history of increased alcohol use over the several months prior with 7-8 airplane bottles of liquor 3-4 days per week. BNP was elevated and CTA showed pulmonary edema. Echo showed new onset systolic HF with EF 15% and decreased RV systolic function. Cardiology was consulted and she was transferred to [facility].   While admitted she was diuresed with IV lasix. Right and Left heart cath were done. LHC showed no significant coronary artery disease and RHC was within normal limits. Her cardiomyopathy is possibly secondary to recent increased alcohol use or uncontrolled HTN. Viral myocarditis was felt to be a possible etiology with recent cough, however cMRI showed no myocardial LGE, so no definitive evidence for prior MI, infiltrative disease, or myocarditis.  Also of note, she has no family history of cardiomyopathy, negative HIV, and TSH was normal.   Echo in 3/21 showed EF 30-35%, global hypokinesis, mild RV dilation with normal function, mild MR.   On 06/06/20, she had a CBD gummy with a high dosage of CBD in it.  Shortly afterwards, she became lightheaded and dizzy and actually passed out.  She had been out of her meds for several days and her BP was elevated when she went to the ER.  HS-TnI and D dimer were normal. She was discharged home.   She returns today for followup of CHF.  She is now drinking ETOH only very rarely.  She is on contraception.   BP is still elevated today, taking all meds now.  No further lightheadedness or syncope.  Unable to tolerate Bidil due to headaches.  No significant exertional dyspnea.  No orthopnea/PND.   ECG (personally reviewed): sinus tachy 101, QTc 509 msec  Labs  (11/20): K 3.8, creatinine 1.1 Labs (2/21): K 3.6, creatinine 0.89 Labs (12/21): BNP 145, K 3.7, creatinine 0.67, hgb 12.2  PMH: 1. HTN - Renal artery dopplers (3/21): No evidence for renal artery stenosis.  2. ETOH abuse: Has quit.  3. Chronic systolic CHF: Nonischemic cardiomyopathy.  - LHC/RHC (11/20): No significant CAD; mean RA 1, PA 24/8, mean PCWP 5, CI 3.54.  - Echo (11/20): EF 15%, diffuse hypokinesis, mildly decreased RV systolic function.  - CMRI (11/20): LV mildly dilated with EF 16%, normal RV size with EF 28%, no myocardial LGE.  - Echo (3/21): EF 30-35%, global hypokinesis, mild RV dilation with normal function, mild MR.  4. COVID-19 infection 9/21 5. Prolonged QT interval  Review of Systems: All systems reviewed and negative except as per HPI.    Current Outpatient Medications  Medication Sig Dispense Refill  . acetaminophen (TYLENOL) 325 MG tablet Take 650 mg by mouth every 6 (six) hours as needed.    Marland Kitchen amoxicillin (AMOXIL) 500 MG capsule Take by mouth. Before dental procedure    . carvedilol (COREG) 25 MG tablet Take 1 tablet (25 mg total) by mouth 2 (two) times daily with a meal. 180 tablet 3  . etonogestrel (NEXPLANON) 68 MG IMPL implant Inject 1 each into the skin once.    . ivabradine (CORLANOR) 5 MG TABS tablet Take 0.5 tablets (2.5 mg total) by mouth 2 (two) times daily with a meal. 90 tablet 3  . potassium chloride (KLOR-CON) 10 MEQ  tablet Take 2 tablets (20 mEq total) by mouth daily. 60 tablet 3  . sacubitril-valsartan (ENTRESTO) 97-103 MG Take 1 tablet by mouth 2 (two) times daily. 60 tablet 5  . spironolactone (ALDACTONE) 50 MG tablet TAKE (1) TABLET BY MOUTH DAILY 30 tablet 3  . traMADol (ULTRAM) 50 MG tablet Take by mouth every 6 (six) hours as needed.     No current facility-administered medications for this visit.    No Known Allergies    Social History   Socioeconomic History  . Marital status: Single    Spouse name: Not on file  . Number of  children: Not on file  . Years of education: Not on file  . Highest education level: Not on file  Occupational History  . Not on file  Tobacco Use  . Smoking status: Never Smoker  . Smokeless tobacco: Never Used  Vaping Use  . Vaping Use: Never used  Substance and Sexual Activity  . Alcohol use: Yes    Comment: occ  . Drug use: No  . Sexual activity: Yes    Birth control/protection: Implant  Other Topics Concern  . Not on file  Social History Narrative  . Not on file   Social Determinants of Health   Financial Resource Strain: Low Risk   . Difficulty of Paying Living Expenses: Not hard at all  Food Insecurity: No Food Insecurity  . Worried About Programme researcher, broadcasting/film/video in the Last Year: Never true  . Ran Out of Food in the Last Year: Never true  Transportation Needs: No Transportation Needs  . Lack of Transportation (Medical): No  . Lack of Transportation (Non-Medical): No  Physical Activity: Insufficiently Active  . Days of Exercise per Week: 1 day  . Minutes of Exercise per Session: 60 min  Stress: No Stress Concern Present  . Feeling of Stress : Not at all  Social Connections: Moderately Integrated  . Frequency of Communication with Friends and Family: More than three times a week  . Frequency of Social Gatherings with Friends and Family: More than three times a week  . Attends Religious Services: More than 4 times per year  . Active Member of Clubs or Organizations: No  . Attends Banker Meetings: Never  . Marital Status: Living with partner  Intimate Partner Violence: Not At Risk  . Fear of Current or Ex-Partner: No  . Emotionally Abused: No  . Physically Abused: No  . Sexually Abused: No      Family History  Problem Relation Age of Onset  . Diabetes Maternal Grandmother   . Cancer Paternal Grandfather        prancreatic  . COPD Maternal Grandfather   . Cancer Maternal Grandfather        prostate  . Asthma Maternal Grandfather   . Asthma Son    . Bronchitis Son     There were no vitals filed for this visit. PHYSICAL EXAM: General: NAD Neck: No JVD, no thyromegaly or thyroid nodule.  Lungs: Clear to auscultation bilaterally with normal respiratory effort. CV: Nondisplaced PMI.  Heart regular S1/S2, no S3/S4, no murmur.  No peripheral edema.  No carotid bruit.  Normal pedal pulses.  Abdomen: Soft, nontender, no hepatosplenomegaly, no distention.  Skin: Intact without lesions or rashes.  Neurologic: Alert and oriented x 3.  Psych: Normal affect. Extremities: No clubbing or cyanosis.  HEENT: Normal.   ASSESSMENT & PLAN:  1. Chronic systolic CHF: Cardiomyopathy of uncertain etiology. No FH cardiomyopathy, negative  HIV and normal TSH, no recent pregnancy. She had been drinking heavily for several months prior to diagnosis, ETOH may contribute to her cardiomyopathy. Uncontrolled HTN likely plays a role. LHC 04/2019 w/ no CAD, filling pressures optimized and preserved cardiac output. Cardiac MRI showed no myocardial LGE, so no definitive evidence for prior MI, infiltrative disease, or myocarditis. Echo in 3/21 showed EF 30-35%.  Now doing better symptomatically, NYHA class I-II symptoms.  Not volume overloaded.  She has quit ETOH. BP remains high.  - Stay off ETOH.  - Continue Entresto 97/103 bid.  - Increase spironolactone to 50 mg daily with BMET in 10 days.   - Increase Coreg to 18.75 mg bid.   - Continue Corlanor 5 mg bid.  - Unable to tolerate Bidil due to headache.  - We discussed importance of avoiding pregnancy given risk of fetotoxicity w/ HF meds. She reports she is compliant w/ OCPs.  - Narrow QRS, not CRT candidate.  With syncopal episode, I will arrange for repeat echo.  Will need ICD if EF remains < 35%.  2. HTN: BP remains high. No evidence for fibromuscular dysplasia on renal artery dopplers.  - Increase spironolactone and Coreg as above.  3. ETOH abuse: She has quit ETOH. 4. Prolonged QT interval: Continue to  follow this. ?Acquired versus congenital.  5. Syncope: Uncertain cause, may be related to high dose of CBD in gummy.  No recurrence.  - 14 day Zio patch, can repeat if no arrhythmias.  - Echo as above.   F/u with HF pharmacist in 3 wks for medication titration.  I will see her in 6 wks.     Anderson Malta [place], FNP 08/10/20

## 2020-08-11 ENCOUNTER — Encounter (HOSPITAL_COMMUNITY): Payer: Medicaid Other

## 2020-08-11 ENCOUNTER — Telehealth (HOSPITAL_COMMUNITY): Payer: Self-pay | Admitting: Cardiology

## 2020-08-11 ENCOUNTER — Other Ambulatory Visit (HOSPITAL_COMMUNITY): Payer: Self-pay | Admitting: Cardiology

## 2020-08-11 NOTE — Telephone Encounter (Signed)
appt reminder call made Pt reports she will be present for appt today Parking code given to patient

## 2020-08-13 ENCOUNTER — Telehealth (HOSPITAL_COMMUNITY): Payer: Self-pay | Admitting: Pharmacy Technician

## 2020-08-13 NOTE — Telephone Encounter (Signed)
Sent PA for [facility] to [facility] via fax.  Will follow up.

## 2020-08-14 NOTE — Telephone Encounter (Signed)
Advanced Heart Failure Patient Advocate Encounter  Prior Authorization for Sherryll Burger has been approved.    PA# [id number] Effective dates: 08/13/20 through 08/13/21  Archer Asa, CPhT

## 2020-08-17 ENCOUNTER — Observation Stay (HOSPITAL_COMMUNITY)
Admission: EM | Admit: 2020-08-17 | Discharge: 2020-08-18 | Disposition: A | Discharge status: Home / Self Care | Payer: Medicaid Other | Attending: Family Medicine | Admitting: Family Medicine

## 2020-08-17 ENCOUNTER — Encounter (HOSPITAL_COMMUNITY): Payer: Self-pay | Admitting: *Deleted

## 2020-08-17 ENCOUNTER — Emergency Department (HOSPITAL_COMMUNITY): Payer: Medicaid Other

## 2020-08-17 DIAGNOSIS — R55 Syncope and collapse: Secondary | ICD-10-CM

## 2020-08-17 DIAGNOSIS — E876 Hypokalemia: Secondary | ICD-10-CM | POA: Insufficient documentation

## 2020-08-17 DIAGNOSIS — I5021 Acute systolic (congestive) heart failure: Secondary | ICD-10-CM | POA: Diagnosis present

## 2020-08-17 DIAGNOSIS — F419 Anxiety disorder, unspecified: Secondary | ICD-10-CM | POA: Diagnosis not present

## 2020-08-17 DIAGNOSIS — I160 Hypertensive urgency: Secondary | ICD-10-CM

## 2020-08-17 DIAGNOSIS — Z79899 Other long term (current) drug therapy: Secondary | ICD-10-CM | POA: Insufficient documentation

## 2020-08-17 DIAGNOSIS — I5023 Acute on chronic systolic (congestive) heart failure: Secondary | ICD-10-CM | POA: Diagnosis not present

## 2020-08-17 DIAGNOSIS — R9431 Abnormal electrocardiogram [ECG] [EKG]: Secondary | ICD-10-CM | POA: Insufficient documentation

## 2020-08-17 DIAGNOSIS — Z9114 Patient's other noncompliance with medication regimen: Secondary | ICD-10-CM | POA: Diagnosis not present

## 2020-08-17 DIAGNOSIS — Z20822 Contact with and (suspected) exposure to covid-19: Secondary | ICD-10-CM | POA: Insufficient documentation

## 2020-08-17 DIAGNOSIS — E559 Vitamin D deficiency, unspecified: Secondary | ICD-10-CM | POA: Insufficient documentation

## 2020-08-17 DIAGNOSIS — I429 Cardiomyopathy, unspecified: Secondary | ICD-10-CM | POA: Insufficient documentation

## 2020-08-17 DIAGNOSIS — I110 Hypertensive heart disease with heart failure: Secondary | ICD-10-CM | POA: Insufficient documentation

## 2020-08-17 DIAGNOSIS — I169 Hypertensive crisis, unspecified: Secondary | ICD-10-CM | POA: Diagnosis not present

## 2020-08-17 LAB — CBC WITH DIFFERENTIAL/PLATELET
Abs Immature Granulocytes: 0.01 10*3/uL (ref 0.00–0.07)
Basophils Absolute: 0 10*3/uL (ref 0.0–0.1)
Basophils Relative: 1 %
Eosinophils Absolute: 0 10*3/uL (ref 0.0–0.5)
Eosinophils Relative: 1 %
HCT: 36.9 % (ref 36.0–46.0)
Hemoglobin: 12.2 g/dL (ref 12.0–15.0)
Immature Granulocytes: 0 %
Lymphocytes Relative: 25 %
Lymphs Abs: 1.5 10*3/uL (ref 0.7–4.0)
MCH: 29.7 pg (ref 26.0–34.0)
MCHC: 33.1 g/dL (ref 30.0–36.0)
MCV: 89.8 fL (ref 80.0–100.0)
Monocytes Absolute: 0.5 10*3/uL (ref 0.1–1.0)
Monocytes Relative: 8 %
Neutro Abs: 4 10*3/uL (ref 1.7–7.7)
Neutrophils Relative %: 65 %
Platelets: 226 10*3/uL (ref 150–400)
RBC: 4.11 MIL/uL (ref 3.87–5.11)
RDW: 13.5 % (ref 11.5–15.5)
WBC: 6.1 10*3/uL (ref 4.0–10.5)
nRBC: 0 % (ref 0.0–0.2)

## 2020-08-17 LAB — BASIC METABOLIC PANEL
Anion gap: 10 (ref 5–15)
BUN: 13 mg/dL (ref 6–20)
CO2: 24 mmol/L (ref 22–32)
Calcium: 9.2 mg/dL (ref 8.9–10.3)
Chloride: 103 mmol/L (ref 98–111)
Creatinine, Ser: 0.66 mg/dL (ref 0.44–1.00)
GFR, Estimated: >60 mL/min (ref >60)
Glucose, Bld: 96 mg/dL (ref 70–99)
Potassium: 3.3 mmol/L — ABNORMAL LOW (ref 3.5–5.1)
Sodium: 137 mmol/L (ref 135–145)

## 2020-08-17 LAB — TROPONIN I (HIGH SENSITIVITY)
Troponin I (High Sensitivity): 8 ng/L (ref <18)
Troponin I (High Sensitivity): 7 ng/L (ref <18)

## 2020-08-17 LAB — BRAIN NATRIURETIC PEPTIDE: B Natriuretic Peptide: 213 pg/mL — ABNORMAL HIGH (ref 0.0–100.0)

## 2020-08-17 LAB — HCG, QUANTITATIVE, PREGNANCY: hCG, Beta Chain, Quant, S: <1 m[IU]/mL (ref <5)

## 2020-08-17 MED ORDER — METOPROLOL TARTRATE 5 MG/5ML IV SOLN
5.0000 mg | Freq: Once | INTRAVENOUS | Status: AC
  Administered 2020-08-17: 5 mg via INTRAVENOUS
  Filled 2020-08-17: qty 5

## 2020-08-17 MED ORDER — HYDRALAZINE HCL 20 MG/ML IJ SOLN
10.0000 mg | Freq: Once | INTRAMUSCULAR | Status: AC
  Administered 2020-08-17: 10 mg via INTRAVENOUS
  Filled 2020-08-17: qty 1

## 2020-08-17 MED ORDER — LORAZEPAM 2 MG/ML IJ SOLN
0.5000 mg | Freq: Once | INTRAMUSCULAR | Status: AC
  Administered 2020-08-17: 0.5 mg via INTRAVENOUS
  Filled 2020-08-17: qty 1

## 2020-08-17 NOTE — ED Triage Notes (Signed)
Pt passed out today while walking, pt with hx of LOC and CHF and HTN.  [facility] reports that pt was hypertensive 21/120 and 168/118.  Pt with anxious feelings as well. Denies any pain at present.

## 2020-08-17 NOTE — ED Provider Notes (Signed)
[facility] EMERGENCY DEPARTMENT Provider Note   CSN: [id number] Arrival date & time: 08/17/20  1547     History Chief Complaint  Patient presents with  . Loss of Consciousness    Tina Fletcher is a 35 y.o. female with a history of HTN and CHF, prior episodes of syncope, was walking from her cousins home to her home (lives next door) when she became lightheaded and had a syncopal event.  EMS was called and bp's taken were 210/120 the 169/118.  She continues to have persistent lightheadedness and endorses anxiety as well.  She denies chest pain, palpitations, sob,  n/v, headache, vision changes (currently) although vision blurred out prior to syncope.  She denies any injury sustained with her fall. She takes entresto, coreg and spironolactone, states has difficulty remembering to take her evening doses of her medicines but has had them tonight after this event occurred.    HPI     Past Medical History:  Diagnosis Date  . Abnormal menstrual periods 02/12/2014  . Herpes simplex without mention of complication   . Hypertension   . Medical history non-contributory   . Migraines   . Nexplanon insertion 08/13/2013   08/13/13 inserted left arm, remove 08/13/16  . Vitamin D deficiency 04/01/2016    Patient Active Problem List   Diagnosis Date Noted  . Cervical cancer screening 10/23/2019  . Systolic CHF, acute (HCC) 05/10/2019  . Vitamin D deficiency 04/01/2016  . Nexplanon insertion 08/13/2013  . Thrombocytopenia (HCC) 04/17/2013  . 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 Underweight 01/08/2013    Past Surgical History:  Procedure Laterality Date  . NO PAST SURGERIES    . RIGHT/LEFT HEART CATH AND CORONARY ANGIOGRAPHY N/A 05/14/2019   Procedure: RIGHT/LEFT HEART CATH AND CORONARY ANGIOGRAPHY;  Surgeon: Laurey Morale, MD;  Location: [facility] INVASIVE CV LAB;  Service: Cardiovascular;  Laterality: N/A;     OB History    Gravida  2   Para  2   Term  2   Preterm      AB       Living  2     SAB      IAB      Ectopic      Multiple      Live Births  2           Family History  Problem Relation Age of Onset  . Diabetes Maternal Grandmother   . Cancer Paternal Grandfather        prancreatic  . COPD Maternal Grandfather   . Cancer Maternal Grandfather        prostate  . Asthma Maternal Grandfather   . Asthma Son   . Bronchitis Son     Social History   Tobacco Use  . Smoking status: Never Smoker  . Smokeless tobacco: Never Used  Vaping Use  . Vaping Use: Never used  Substance Use Topics  . Alcohol use: Yes    Comment: occ  . Drug use: No    Home Medications Prior to Admission medications   Medication Sig Start Date End Date Taking? Authorizing Provider  acetaminophen (TYLENOL) 325 MG tablet Take 650 mg by mouth every 6 (six) hours as needed.   Yes [provider]  ENTRESTO 97-103 MG TAKE (1) TABLET BY MOUTH TWICE DAILY. Patient taking differently: Take 1 tablet by mouth 2 (two) times daily. 08/11/20  Yes Laurey Morale, MD  etonogestrel (NEXPLANON) 68 MG IMPL implant Inject 1 each into the skin  once.   Yes [provider]  ivabradine (CORLANOR) 5 MG TABS tablet Take 0.5 tablets (2.5 mg total) by mouth 2 (two) times daily with a meal. 07/01/20  Yes Laurey Morale, MD  potassium chloride (KLOR-CON) 10 MEQ tablet Take 2 tablets (20 mEq total) by mouth daily. 08/02/19  Yes Laurey Morale, MD  spironolactone (ALDACTONE) 50 MG tablet TAKE (1) TABLET BY MOUTH DAILY 06/10/20  Yes Laurey Morale, MD  amoxicillin (AMOXIL) 500 MG capsule Take by mouth. Before dental procedure Patient not taking: No sig reported 05/29/20   [provider]  carvedilol (COREG) 25 MG tablet Take 1 tablet (25 mg total) by mouth 2 (two) times daily with a meal. 07/01/20   Laurey Morale, MD  traMADol (ULTRAM) 50 MG tablet Take by mouth every 6 (six) hours as needed. Patient not taking: Reported on 08/17/2020    [provider]     Allergies    Patient has no known allergies.  Review of Systems   Review of Systems  Constitutional: Negative for chills and fever.  HENT: Negative.   Eyes: Negative.   Respiratory: Negative for chest tightness, shortness of breath and wheezing.   Cardiovascular: Negative for chest pain, palpitations and leg swelling.  Gastrointestinal: Negative for abdominal pain and nausea.  Genitourinary: Negative.   Musculoskeletal: Negative for arthralgias, joint swelling and neck pain.  Skin: Negative.  Negative for rash and wound.  Neurological: Positive for syncope and light-headedness. Negative for dizziness, weakness, numbness and headaches.  Psychiatric/Behavioral: The patient is nervous/anxious.     Physical Exam Updated Vital Signs BP (!) 150/97   Pulse 94   Temp 97.8 F (36.6 C) (Oral)   Resp 15   SpO2 100%   Physical Exam Vitals and nursing note reviewed.  Constitutional:      Appearance: She is well-developed and well-nourished.     Comments: Tearful and anxious  HENT:     Head: Normocephalic and atraumatic.     Mouth/Throat:     Mouth: Mucous membranes are moist.  Eyes:     Conjunctiva/sclera: Conjunctivae normal.  Neck:     Vascular: No JVD.  Cardiovascular:     Rate and Rhythm: Normal rate and regular rhythm.     Pulses: Normal pulses and intact distal pulses.     Heart sounds: Normal heart sounds.  Pulmonary:     Effort: Pulmonary effort is normal.     Breath sounds: Normal breath sounds. No wheezing or rales.  Abdominal:     General: Bowel sounds are normal.     Palpations: Abdomen is soft.     Tenderness: There is no abdominal tenderness. There is no guarding.  Musculoskeletal:        General: Normal range of motion.     Cervical back: Normal range of motion.     Right lower leg: No edema.     Left lower leg: No edema.  Skin:    General: Skin is warm and dry.  Neurological:     General: No focal deficit present.     Mental Status: She is alert.   Psychiatric:        Mood and Affect: Mood and affect normal.     ED Results / Procedures / Treatments   Labs (all labs ordered are listed, but only abnormal results are displayed) Labs Reviewed  BASIC METABOLIC PANEL - Abnormal; Notable for the following components:      Result Value   Potassium 3.3 (*)  All other components within normal limits  BRAIN NATRIURETIC PEPTIDE - Abnormal; Notable for the following components:   B Natriuretic Peptide 213.0 (*)    All other components within normal limits  SARS CORONAVIRUS 2 (TAT 6-24 HRS)  CBC WITH DIFFERENTIAL/PLATELET  HCG, QUANTITATIVE, PREGNANCY  URINALYSIS, ROUTINE W REFLEX MICROSCOPIC  RAPID URINE DRUG SCREEN, HOSP PERFORMED  TROPONIN I (HIGH SENSITIVITY)  TROPONIN I (HIGH SENSITIVITY)    EKG None  Radiology DG Chest Portable 1 View  Result Date: 08/17/2020 CLINICAL DATA:  Syncope. EXAM: PORTABLE CHEST 1 VIEW COMPARISON:  May 10, 2019 FINDINGS: The heart size and mediastinal contours are within normal limits. Both lungs are clear. The visualized skeletal structures are unremarkable. IMPRESSION: No active disease. Electronically Signed   By: Gerome Sam III M.D   On: 08/17/2020 20:28    Procedures Procedures   Medications Ordered in ED Medications  metoprolol tartrate (LOPRESSOR) injection 5 mg (5 mg Intravenous Given 08/17/20 2006)  LORazepam (ATIVAN) injection 0.5 mg (0.5 mg Intravenous Given 08/17/20 2150)  hydrALAZINE (APRESOLINE) injection 10 mg (10 mg Intravenous Given 08/17/20 2247)    ED Course  I have reviewed the triage vital signs and the nursing notes.  Pertinent labs & imaging results that were available during my care of the patient were reviewed by me and considered in my medical decision making (see chart for details).    MDM Rules/Calculators/A&P                         Labs reviewed and discussed.   Pt with hypertensive urgency, symptomatic for syncope.  Persistent elevated bp despite  metroprolol, ativan for anxiety and hydralazine. Pt would benefit from admission/ overnight observation.  Discussed with Dr Carren Rang who will see pt for admission. Final Clinical Impression(s) / ED Diagnoses Final diagnoses:  Hypertensive urgency  Syncope, unspecified syncope type    Rx / DC Orders ED Discharge Orders    None       Victoriano Lain 08/18/20 0036    Vanetta Mulders, MD 09/03/20 1626

## 2020-08-18 ENCOUNTER — Other Ambulatory Visit: Payer: Self-pay

## 2020-08-18 ENCOUNTER — Other Ambulatory Visit (HOSPITAL_COMMUNITY): Payer: Self-pay

## 2020-08-18 ENCOUNTER — Encounter (HOSPITAL_COMMUNITY): Payer: Self-pay

## 2020-08-18 ENCOUNTER — Emergency Department (HOSPITAL_COMMUNITY)
Admission: EM | Admit: 2020-08-18 | Discharge: 2020-08-19 | Disposition: A | Discharge status: Home / Self Care | Payer: Medicaid Other | Attending: Emergency Medicine | Admitting: Emergency Medicine

## 2020-08-18 DIAGNOSIS — Z79899 Other long term (current) drug therapy: Secondary | ICD-10-CM | POA: Insufficient documentation

## 2020-08-18 DIAGNOSIS — I169 Hypertensive crisis, unspecified: Secondary | ICD-10-CM | POA: Diagnosis not present

## 2020-08-18 DIAGNOSIS — I10 Essential (primary) hypertension: Secondary | ICD-10-CM

## 2020-08-18 DIAGNOSIS — I5022 Chronic systolic (congestive) heart failure: Secondary | ICD-10-CM | POA: Insufficient documentation

## 2020-08-18 DIAGNOSIS — R9431 Abnormal electrocardiogram [ECG] [EKG]: Secondary | ICD-10-CM | POA: Diagnosis present

## 2020-08-18 DIAGNOSIS — R55 Syncope and collapse: Secondary | ICD-10-CM | POA: Insufficient documentation

## 2020-08-18 DIAGNOSIS — Z955 Presence of coronary angioplasty implant and graft: Secondary | ICD-10-CM | POA: Insufficient documentation

## 2020-08-18 DIAGNOSIS — E876 Hypokalemia: Secondary | ICD-10-CM | POA: Diagnosis present

## 2020-08-18 DIAGNOSIS — I110 Hypertensive heart disease with heart failure: Secondary | ICD-10-CM | POA: Insufficient documentation

## 2020-08-18 DIAGNOSIS — R030 Elevated blood-pressure reading, without diagnosis of hypertension: Secondary | ICD-10-CM | POA: Diagnosis present

## 2020-08-18 HISTORY — DX: Heart failure, unspecified: I50.9

## 2020-08-18 LAB — CBC WITH DIFFERENTIAL/PLATELET
Abs Immature Granulocytes: 0.01 10*3/uL (ref 0.00–0.07)
Basophils Absolute: 0 10*3/uL (ref 0.0–0.1)
Basophils Relative: 1 %
Eosinophils Absolute: 0 10*3/uL (ref 0.0–0.5)
Eosinophils Relative: 1 %
HCT: 39.6 % (ref 36.0–46.0)
Hemoglobin: 13.2 g/dL (ref 12.0–15.0)
Immature Granulocytes: 0 %
Lymphocytes Relative: 31 %
Lymphs Abs: 1.4 10*3/uL (ref 0.7–4.0)
MCH: 29.1 pg (ref 26.0–34.0)
MCHC: 33.3 g/dL (ref 30.0–36.0)
MCV: 87.4 fL (ref 80.0–100.0)
Monocytes Absolute: 0.5 10*3/uL (ref 0.1–1.0)
Monocytes Relative: 10 %
Neutro Abs: 2.5 10*3/uL (ref 1.7–7.7)
Neutrophils Relative %: 57 %
Platelets: 233 10*3/uL (ref 150–400)
RBC: 4.53 MIL/uL (ref 3.87–5.11)
RDW: 13.2 % (ref 11.5–15.5)
WBC: 4.3 10*3/uL (ref 4.0–10.5)
nRBC: 0 % (ref 0.0–0.2)

## 2020-08-18 LAB — URINALYSIS, ROUTINE W REFLEX MICROSCOPIC
Bilirubin Urine: NEGATIVE
Glucose, UA: NEGATIVE mg/dL
Hgb urine dipstick: NEGATIVE
Ketones, ur: 80 mg/dL — AB
Leukocytes,Ua: NEGATIVE
Nitrite: NEGATIVE
Protein, ur: 30 mg/dL — AB
Specific Gravity, Urine: 1.021 (ref 1.005–1.030)
pH: 6 (ref 5.0–8.0)

## 2020-08-18 LAB — COMPREHENSIVE METABOLIC PANEL
ALT: 16 U/L (ref 0–44)
AST: 20 U/L (ref 15–41)
Albumin: 4.7 g/dL (ref 3.5–5.0)
Alkaline Phosphatase: 40 U/L (ref 38–126)
Anion gap: 13 (ref 5–15)
BUN: 8 mg/dL (ref 6–20)
CO2: 24 mmol/L (ref 22–32)
Calcium: 9.6 mg/dL (ref 8.9–10.3)
Chloride: 100 mmol/L (ref 98–111)
Creatinine, Ser: 0.61 mg/dL (ref 0.44–1.00)
GFR, Estimated: >60 mL/min (ref >60)
Glucose, Bld: 110 mg/dL — ABNORMAL HIGH (ref 70–99)
Potassium: 2.9 mmol/L — ABNORMAL LOW (ref 3.5–5.1)
Sodium: 137 mmol/L (ref 135–145)
Total Bilirubin: 1.2 mg/dL (ref 0.3–1.2)
Total Protein: 7.8 g/dL (ref 6.5–8.1)

## 2020-08-18 LAB — HIV ANTIBODY (ROUTINE TESTING W REFLEX): HIV Screen 4th Generation wRfx: NONREACTIVE

## 2020-08-18 LAB — MAGNESIUM: Magnesium: 1.6 mg/dL — ABNORMAL LOW (ref 1.7–2.4)

## 2020-08-18 LAB — RAPID URINE DRUG SCREEN, HOSP PERFORMED
Amphetamines: NOT DETECTED
Barbiturates: NOT DETECTED
Benzodiazepines: NOT DETECTED
Cocaine: NOT DETECTED
Opiates: POSITIVE — AB
Tetrahydrocannabinol: NOT DETECTED

## 2020-08-18 LAB — TSH: TSH: 0.899 u[IU]/mL (ref 0.350–4.500)

## 2020-08-18 LAB — SARS CORONAVIRUS 2 (TAT 6-24 HRS): SARS Coronavirus 2: NEGATIVE

## 2020-08-18 MED ORDER — AMLODIPINE BESYLATE 5 MG PO TABS
5.0000 mg | ORAL_TABLET | Freq: Every day | ORAL | Status: DC
  Administered 2020-08-18: 5 mg via ORAL

## 2020-08-18 MED ORDER — ONDANSETRON HCL 4 MG/2ML IJ SOLN: 4.0000 mg | Freq: Four times a day (QID) | INTRAMUSCULAR | Status: DC | PRN

## 2020-08-18 MED ORDER — ONDANSETRON HCL 4 MG PO TABS: 4.0000 mg | ORAL_TABLET | Freq: Four times a day (QID) | ORAL | Status: DC | PRN

## 2020-08-18 MED ORDER — OXYCODONE HCL 5 MG PO TABS: 5.0000 mg | ORAL_TABLET | ORAL | Status: DC | PRN

## 2020-08-18 MED ORDER — ACETAMINOPHEN 325 MG PO TABS: 650.0000 mg | ORAL_TABLET | Freq: Four times a day (QID) | ORAL | Status: DC | PRN

## 2020-08-18 MED ORDER — SACUBITRIL-VALSARTAN 97-103 MG PO TABS
1.0000 | ORAL_TABLET | Freq: Two times a day (BID) | ORAL | Status: DC
  Administered 2020-08-18: 1 via ORAL
  Filled 2020-08-18: qty 1

## 2020-08-18 MED ORDER — ISOSORB DINITRATE-HYDRALAZINE 20-37.5 MG PO TABS
1.0000 | ORAL_TABLET | Freq: Three times a day (TID) | ORAL | Status: DC
  Filled 2020-08-18 (×7): qty 1

## 2020-08-18 MED ORDER — NICARDIPINE HCL IN NACL 20-0.86 MG/200ML-% IV SOLN
3.0000 mg/h | INTRAVENOUS | Status: DC
  Administered 2020-08-18 (×2): 3 mg/h via INTRAVENOUS
  Filled 2020-08-18: qty 200

## 2020-08-18 MED ORDER — CARVEDILOL 12.5 MG PO TABS
25.0000 mg | ORAL_TABLET | Freq: Two times a day (BID) | ORAL | Status: DC
  Administered 2020-08-18 (×2): 25 mg via ORAL
  Filled 2020-08-18: qty 2

## 2020-08-18 MED ORDER — IVABRADINE HCL 5 MG PO TABS
2.5000 mg | ORAL_TABLET | Freq: Two times a day (BID) | ORAL | Status: DC
  Administered 2020-08-18: 2.5 mg via ORAL
  Filled 2020-08-18 (×5): qty 1

## 2020-08-18 MED ORDER — ACETAMINOPHEN 650 MG RE SUPP: 650.0000 mg | Freq: Four times a day (QID) | RECTAL | Status: DC | PRN

## 2020-08-18 MED ORDER — SPIRONOLACTONE 25 MG PO TABS
50.0000 mg | ORAL_TABLET | Freq: Every day | ORAL | Status: DC
  Administered 2020-08-18: 50 mg via ORAL
  Filled 2020-08-18: qty 2

## 2020-08-18 MED ORDER — HEPARIN SODIUM (PORCINE) 5000 UNIT/ML IJ SOLN
5000.0000 [IU] | Freq: Three times a day (TID) | INTRAMUSCULAR | Status: DC
  Administered 2020-08-18: 5000 [IU] via SUBCUTANEOUS
  Filled 2020-08-18: qty 1

## 2020-08-18 MED ORDER — POTASSIUM CHLORIDE 20 MEQ PO PACK
40.0000 meq | PACK | Freq: Once | ORAL | Status: AC
  Administered 2020-08-18: 40 meq via ORAL
  Filled 2020-08-18: qty 2

## 2020-08-18 NOTE — ED Notes (Signed)
Pt being discharged from ED by Gwenlyn Perking, MD. Discharge instructions given. Pt verbalized understanding. Pt ambulatory at discharge.

## 2020-08-18 NOTE — ED Triage Notes (Signed)
Pt to er, pt states that she was just d/c earlier today, states that she went to [facility] and got a blood pressure cut and she took her blood pressure and it was elevated so she got nervous and started having some chest pain.  States that if her pressure was normal she wouldn't have any chest pain.

## 2020-08-18 NOTE — Discharge Summary (Signed)
Physician Discharge Summary  Tina Fletcher JSE:[id number] DOB: 11-15-1985 DOA: 08/17/2020  PCP: Merlyn Carlin Mamone, MD (Inactive)  Admit date: 08/17/2020 Discharge date: 08/18/2020  Time spent: 30 minutes  Recommendations for Outpatient Follow-up:  1. Repeat BMET to follow electrolytes and renal function 2. Reassess blood pressure and further adjust antihypertensive regimen as needed  Discharge Diagnoses:  Principal Problem:   Hypertensive crisis Active Problems:   Systolic CHF, acute (HCC)   Hypokalemia   Prolonged QT interval   Discharge Condition: Stable.  Discharged home with instruction to follow-up with PCP and cardiology service.  CODE STATUS: Full code  Diet recommendation: Heart healthy/low-sodium diet.  History of present illness:  As per H&P written by Dr. Carren Rang on 08/17/20 Tina Fletcher  is a 35 y.o. female, with history of syncope, hypertension, vitamin D deficiency, and more presents to the ED with a chief complaint of syncope.  She reports that she was walking across the yard when she knew she was going to pass out, she told her son to call 911 and then she passed out.  She reports that the preceding symptoms were leg shaking and chest tightness.  She reports that she knew that she was going to pass out because he is the same symptoms she had when she has had prior syncopal episodes.  These warning signs would last seconds before syncope.  She loses consciousness for seconds.  When she comes to she is immediately alert and oriented.  She reports nausea after the syncopal episode, and one episode of emesis.  She had noted palpitations.  She reports that her chest tightness is on the left side of her chest "right on my heart."  Today when she came to she reports that her relative kept pouring water on her to keep her alert until EMS got there.  Of note, she has not been taking her evening doses of her heart failure or hypertension medications.  She also reports a  previous syncopal episodes followed CBD Gummies.  On arrival into the ED her blood pressure was as high as 185/119.  They gave her metoprolol which did not improve her blood pressure.  Hydralazine dose was given that temporarily improved her blood pressure but then it went right back up to 159/111.  Her systolic numbers have mostly been reasonable, it is the diastolic numbers that are remaining out of control.  Patient reports that she still felt lightheaded when she walked to the bathroom from her room, so they request admission for blood pressure control.  Of note patient does have a history of CHF.  She had an echo in March 2021 that showed an ejection fraction of 30 to 35%, global hypokinesis, mild right ventricular dilation with normal function, mild MR. This is improved from the echo that she had in November 2020 that showed an ejection fraction of 15%.  Patient has had renal artery Doppler March 2021 with no evidence of renal artery stenosis.  She was previously a heavy drinker, reports that she quit about 6 months ago, but did drink for the Super Bowl.  Hospital Course:  1-Hypertensive crisis -in the setting of medication noncompliance unstable discoloration. -Improved/resolved after resuming home medications and transient usage of nicardipine drip. -Patient educated about importance of medication compliance and low-sodium diet. -Also instructed to avoid the use of alcohol in the future. -She felt back to baseline on the left for home. 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 -Labs echocardiogram demonstrating ejection fraction 30 to  35%.  3-hypokalemia -repleted and daily maintenance supplementation started.  4-hx of prolonged QT -continue minimizing the use of medications and for the prolonged QT interval.  Procedures: See below for x-ray reports  Consultations:  None   Discharge Exam: Vitals:   08/18/20 1230 08/18/20 1300  BP: 124/88 121/87  Pulse: 73 83  Resp: (!) 24 18  Temp:    SpO2: 100% 100%    General: Afebrile, no chest pain, nausea, no vomiting.  Patient reports feeling back to baseline and would like to go home.  Not requiring oxygen supplementation. Cardiovascular: S1-S2, no rubs, no gallops, no JVD. Respiratory: Clear to auscultation bilaterally.  No using accessory muscles. Abdomen: Soft, nontender, nondistended, positive bowel sounds Extremities: No cyanosis, no clubbing.  Discharge Instructions   Discharge Instructions    (HEART FAILURE PATIENTS) Call MD:  Anytime you have any of the following symptoms: 1) 3 pound weight gain in 24 hours or 5 pounds in 1 week 2) shortness of breath, with or without a dry hacking cough 3) swelling in the hands, feet or stomach 4) if you have to sleep on extra pillows at night in order to breathe.   Complete by: As directed    Diet - low sodium heart healthy   Complete by: As directed    Discharge instructions   Complete by: As directed    Maintain adequate hydration Take medication as prescribed in symptoms Follow-up as previously instructed with cardiology service. Follow Low-sodium diet (less than 2 g daily) Follow up with PCP in 10 days   Increase activity slowly   Complete by: As directed      Allergies as of 08/18/2020   No Known Allergies     Medication List    STOP taking these medications   amoxicillin 500 MG capsule Commonly known as: AMOXIL   traMADol 50 MG tablet Commonly known as: ULTRAM     TAKE these medications   acetaminophen 325 MG tablet Commonly known as: TYLENOL Take 650 mg by mouth every 6 (six) hours as  needed.   carvedilol 25 MG tablet Commonly known as: COREG Take 1 tablet (25 mg total) by mouth 2 (two) times daily with a meal.   Entresto 97-103 MG Generic drug: sacubitril-valsartan TAKE (1) TABLET BY MOUTH TWICE DAILY. What changed: See the new instructions.   etonogestrel 68 MG Impl implant Commonly known as: NEXPLANON Inject 1 each into the skin once.   ivabradine 5 MG Tabs tablet Commonly known as: Corlanor Take 0.5 tablets (2.5 mg total) by mouth 2 (two) times daily with a meal.   potassium chloride 10 MEQ tablet Commonly known as: KLOR-CON Take 2 tablets (20 mEq total) by mouth daily.   spironolactone 50 MG tablet Commonly known as: ALDACTONE TAKE (1) TABLET BY MOUTH DAILY      No Known Allergies  Follow-up Information    Merlyn Charle Clear, MD. Schedule an appointment as soon as possible for a visit in 10 day(s).   Specialty: Family Medicine Contact information: [address] [place] [address] [phone]               The results of significant diagnostics from this hospitalization (including imaging, microbiology, ancillary and laboratory) are listed below for reference.  Significant Diagnostic Studies: DG Chest Portable 1 View  Result Date: 08/17/2020 CLINICAL DATA:  Syncope. EXAM: PORTABLE CHEST 1 VIEW COMPARISON:  May 10, 2019 FINDINGS: The heart size and mediastinal contours are within normal limits. Both lungs are clear. The visualized skeletal structures are unremarkable. IMPRESSION: No active disease. Electronically Signed   By: Gerome Sam III M.D   On: 08/17/2020 20:28    Microbiology: Recent Results (from the past 240 hour(s))  SARS CORONAVIRUS 2 (TAT 6-24 HRS) Nasopharyngeal Nasopharyngeal Swab     Status: None   Collection Time: 08/18/20 12:53 AM   Specimen: Nasopharyngeal Swab  Result Value Ref Range Status   SARS Coronavirus 2 NEGATIVE NEGATIVE Final    Comment: (NOTE) SARS-CoV-2 target nucleic acids are  NOT DETECTED.  The SARS-CoV-2 RNA is generally detectable in upper and lower respiratory specimens during the acute phase of infection. Negative results do not preclude SARS-CoV-2 infection, do not rule out co-infections with other pathogens, and should not be used as the sole basis for treatment or other patient management decisions. Negative results must be combined with clinical observations, patient history, and epidemiological information. The expected result is Negative.  Fact Sheet for Patients: HairSlick.no  Fact Sheet for Healthcare Providers: quierodirigir.com  This test is not yet approved or cleared by the [place] FDA and  has been authorized for detection and/or diagnosis of SARS-CoV-2 by FDA under an Emergency Use Authorization (EUA). This EUA will remain  in effect (meaning this test can be used) for the duration of the COVID-19 declaration under Se ction 564(b)(1) of the Act, 21 U.S.C. section 360bbb-3(b)(1), unless the authorization is terminated or revoked sooner.  Performed at [facility], [address], [place], [place] [address]      Labs: Basic Metabolic Panel: Recent Labs  Lab 08/17/20 2027 08/18/20 0417  NA 137 137  K 3.3* 2.9*  CL 103 100  CO2 24 24  GLUCOSE 96 110*  BUN 13 8  CREATININE 0.66 0.61  CALCIUM 9.2 9.6  MG  --  1.6*   Liver Function Tests: Recent Labs  Lab 08/18/20 0417  AST 20  ALT 16  ALKPHOS 40  BILITOT 1.2  PROT 7.8  ALBUMIN 4.7   CBC: Recent Labs  Lab 08/17/20 2027 08/18/20 0417  WBC 6.1 4.3  NEUTROABS 4.0 2.5  HGB 12.2 13.2  HCT 36.9 39.6  MCV 89.8 87.4  PLT 226 233   BNP (last 3 results) Recent Labs    06/06/20 0333 08/17/20 2028  BNP 145.0* 213.0*    Signed:  Vassie Loll MD.  [facility] 08/18/2020, 2:13 PM

## 2020-08-18 NOTE — H&P (Signed)
TRH H&P    Patient Demographics:    Tina Fletcher, is a 35 y.o. female  MRN: [id number]  DOB - 12-04-1985  Admit Date - 08/17/2020  Referring MD/NP/PA: Lincoln MaxinIdol  Outpatient Primary MD for the patient is Luking, Vilinda BlanksWilliam S, MD (Inactive)  Patient coming from: Home  Chief complaint- Syncope   HPI:    Tina PlentyChevonne Fletcher  is a 35 y.o. female, with history of syncope, hypertension, vitamin D deficiency, and more presents to the ED with a chief complaint of syncope.  She reports that she was walking across the yard when she knew she was going to pass out, she told her son to call 911 and then she passed out.  She reports that the preceding symptoms were leg shaking and chest tightness.  She reports that she knew that she was going to pass out because he is the same symptoms she had when she has had prior syncopal episodes.  These warning signs would last seconds before syncope.  She loses consciousness for seconds.  When she comes to she is immediately alert and oriented.  She reports nausea after the syncopal episode, and one episode of emesis.  She had noted palpitations.  She reports that her chest tightness is on the left side of her chest "right on my heart."  Today when she came to she reports that her relative kept pouring water on her to keep her alert until EMS got there.  Of note, she has not been taking her evening doses of her heart failure or hypertension medications.  She also reports a previous syncopal episodes followed CBD Gummies.  On arrival into the ED her blood pressure was as high as 185/119.  They gave her metoprolol which did not improve her blood pressure.  Hydralazine dose was given that temporarily improved her blood pressure but then it went right back up to 159/111.  Her systolic numbers have mostly been reasonable, it is the diastolic numbers that are remaining out of control.  Patient reports that  she still felt lightheaded when she walked to the bathroom from her room, so they request admission for blood pressure control.  Of note patient does have a history of CHF.  She had an echo in March 2021 that showed an ejection fraction of 30 to 35%, global hypokinesis, mild right ventricular dilation with normal function, mild MR. This is improved from the echo that she had in November 2020 that showed an ejection fraction of 15%.  Patient has had renal artery Doppler March 2021 with no evidence of renal artery stenosis.  She was previously a heavy drinker, reports that she quit about 6 months ago, but did drink for the Super Bowl.  Patient does not smoke.  She is not vaccinated for COVID.  Patient is full code  In the ED (in addition to the summary above) Temp 97.8, heart rate 65, respiratory rate 12-20, blood pressure as high as 185/119 CBC shows white blood cell count of 6.1, hemoglobin 12.2 Chemistry shows mild hypokalemia at 3.3, normal BUN/creatinine EKG shows  prolonged QT at 508 Chest x-ray shows no active disease     Review of systems:    In addition to the HPI above,  No Fever-chills, No Headache, No changes with Vision or hearing, No problems swallowing food or Liquids, Admits to chest pain, no cough or Shortness of Breath, No Abdominal pain, admits to nausea and vomiting, bowel movements are regular, No Blood in stool or Urine, No dysuria, No new skin rashes or bruises, No new joints pains-aches,  No new weakness, tingling, numbness in any extremity, No recent weight gain or loss, No polyuria, polydypsia or polyphagia, No significant Mental Stressors.  All other systems reviewed and are negative.    Past History of the following :    Past Medical History:  Diagnosis Date  . Abnormal menstrual periods 02/12/2014  . Herpes simplex without mention of complication   . Hypertension   . Medical history non-contributory   . Migraines   . Nexplanon insertion 08/13/2013    08/13/13 inserted left arm, remove 08/13/16  . Vitamin D deficiency 04/01/2016      Past Surgical History:  Procedure Laterality Date  . NO PAST SURGERIES    . RIGHT/LEFT HEART CATH AND CORONARY ANGIOGRAPHY N/A 05/14/2019   Procedure: RIGHT/LEFT HEART CATH AND CORONARY ANGIOGRAPHY;  Surgeon: Laurey Morale, MD;  Location: [facility];  Service: Cardiovascular;  Laterality: N/A;      Social History:      Social History   Tobacco Use  . Smoking status: Never Smoker  . Smokeless tobacco: Never Used  Substance Use Topics  . Alcohol use: Yes    Comment: occ       Family History :     Family History  Problem Relation Age of Onset  . Diabetes Maternal Grandmother   . Cancer Paternal Grandfather        prancreatic  . COPD Maternal Grandfather   . Cancer Maternal Grandfather        prostate  . Asthma Maternal Grandfather   . Asthma Son   . Bronchitis Son       Home Medications:   Prior to Admission medications   Medication Sig Start Date End Date Taking? Authorizing Provider  acetaminophen (TYLENOL) 325 MG tablet Take 650 mg by mouth every 6 (six) hours as needed.   Yes [provider]  ENTRESTO 97-103 MG TAKE (1) TABLET BY MOUTH TWICE DAILY. Patient taking differently: Take 1 tablet by mouth 2 (two) times daily. 08/11/20  Yes Laurey Morale, MD  etonogestrel (NEXPLANON) 68 MG IMPL implant Inject 1 each into the skin once.   Yes [provider]  ivabradine (CORLANOR) 5 MG TABS tablet Take 0.5 tablets (2.5 mg total) by mouth 2 (two) times daily with a meal. 07/01/20  Yes Laurey Morale, MD  potassium chloride (KLOR-CON) 10 MEQ tablet Take 2 tablets (20 mEq total) by mouth daily. 08/02/19  Yes Laurey Morale, MD  spironolactone (ALDACTONE) 50 MG tablet TAKE (1) TABLET BY MOUTH DAILY 06/10/20  Yes Laurey Morale, MD  amoxicillin (AMOXIL) 500 MG capsule Take by mouth. Before dental procedure Patient not taking: No sig reported 05/29/20    [provider]  carvedilol (COREG) 25 MG tablet Take 1 tablet (25 mg total) by mouth 2 (two) times daily with a meal. 07/01/20   Laurey Morale, MD  traMADol (ULTRAM) 50 MG tablet Take by mouth every 6 (six) hours as needed. Patient not taking: Reported on 08/17/2020  [provider]     Allergies:    No Known Allergies   Physical Exam:   Vitals  Blood pressure 101/64, pulse 92, temperature 97.8 F (36.6 C), temperature source Oral, resp. rate 17, SpO2 100 %.  1.  General: Patient lying supine in bed in no acute distress  2. Psychiatric: Mood and behavior normal for situation, pleasant, cooperative with exam  3. Neurologic: Cranial nerves II through XII are intact, moves all 4 extremities voluntarily, equal sensation in the upper and lower extremities bilaterally, alert and oriented x3, no focal deficit on limited exam  4. HEENMT:  Head is atraumatic, normocephalic, pupils reactive to light, neck is supple, trachea is midline, mucous membranes are moist  5. Respiratory : Lungs are clear to auscultation bilaterally without wheeze, rhonchi, rales, no cyanosis  6. Cardiovascular : Heart rate is normal, rhythm is regular, no murmurs rubs or gallops  7. Gastrointestinal:  Abdomen is soft, nondistended, nontender to palpation  8. Skin:  Skin is warm dry and intact without acute lesions  9.Musculoskeletal:  No peripheral edema, no acute deformity, no calf tenderness    Data Review:    CBC Recent Labs  Lab 08/17/20 2027  WBC 6.1  HGB 12.2  HCT 36.9  PLT 226  MCV 89.8  MCH 29.7  MCHC 33.1  RDW 13.5  LYMPHSABS 1.5  MONOABS 0.5  EOSABS 0.0  BASOSABS 0.0   ------------------------------------------------------------------------------------------------------------------  Results for orders placed or performed during the hospital encounter of 08/17/20 (from the past 48 hour(s))  CBC with Differential/Platelet     Status: None   Collection  Time: 08/17/20  8:27 PM  Result Value Ref Range   WBC 6.1 4.0 - 10.5 K/uL   RBC 4.11 3.87 - 5.11 MIL/uL   Hemoglobin 12.2 12.0 - 15.0 g/dL   HCT 21.3 08.6 - 57.8 %   MCV 89.8 80.0 - 100.0 fL   MCH 29.7 26.0 - 34.0 pg   MCHC 33.1 30.0 - 36.0 g/dL   RDW 46.9 62.9 - 52.8 %   Platelets 226 150 - 400 K/uL   nRBC 0.0 0.0 - 0.2 %   Neutrophils Relative % 65 %   Neutro Abs 4.0 1.7 - 7.7 K/uL   Lymphocytes Relative 25 %   Lymphs Abs 1.5 0.7 - 4.0 K/uL   Monocytes Relative 8 %   Monocytes Absolute 0.5 0.1 - 1.0 K/uL   Eosinophils Relative 1 %   Eosinophils Absolute 0.0 0.0 - 0.5 K/uL   Basophils Relative 1 %   Basophils Absolute 0.0 0.0 - 0.1 K/uL   Immature Granulocytes 0 %   Abs Immature Granulocytes 0.01 0.00 - 0.07 K/uL    Comment: Performed at [facility], [address], [place], [place] [address]  Basic metabolic panel     Status: Abnormal   Collection Time: 08/17/20  8:27 PM  Result Value Ref Range   Sodium 137 135 - 145 mmol/L   Potassium 3.3 (L) 3.5 - 5.1 mmol/L   Chloride 103 98 - 111 mmol/L   CO2 24 22 - 32 mmol/L   Glucose, Bld 96 70 - 99 mg/dL    Comment: Glucose reference range applies only to samples taken after fasting for at least 8 hours.   BUN 13 6 - 20 mg/dL   Creatinine, Ser 4.01 0.44 - 1.00 mg/dL   Calcium 9.2 8.9 - 02.7 mg/dL   GFR, Estimated >25 >36 mL/min    Comment: (NOTE) Calculated using the CKD-EPI Creatinine  Equation (2021)    Anion gap 10 5 - 15    Comment: Performed at [facility], [address] [place], [place] [address]  Brain natriuretic peptide     Status: Abnormal   Collection Time: 08/17/20  8:28 PM  Result Value Ref Range   B Natriuretic Peptide 213.0 (H) 0.0 - 100.0 pg/mL    Comment: Performed at [facility], [address], [place], [place] [address]  hCG, quantitative, pregnancy     Status: None   Collection Time: 08/17/20  8:28 PM  Result Value Ref Range   hCG, Beta Chain, Quant, S <1 <5 mIU/mL    Comment:          GEST. AGE       CONC.  (mIU/mL)   <=1 WEEK        5 - 50     2 WEEKS       50 - 500     3 WEEKS       100 - 10,000     4 WEEKS     1,000 - 30,000     5 WEEKS     3,500 - 115,000   6-8 WEEKS     12,000 - 270,000    12 WEEKS     15,000 - 220,000        FEMALE AND NON-PREGNANT FEMALE:     LESS THAN 5 mIU/mL Performed at [facility], [address], [place], [place] [address]   Troponin I (High Sensitivity)     Status: None   Collection Time: 08/17/20  8:28 PM  Result Value Ref Range   Troponin I (High Sensitivity) 8 <18 ng/L    Comment: (NOTE) Elevated high sensitivity troponin I (hsTnI) values and significant  changes across serial measurements may suggest ACS but many other  chronic and acute conditions are known to elevate hsTnI results.  Refer to the "Links" section for chest pain algorithms and additional  guidance. Performed at [facility], [address] [place], [place] [address]   Troponin I (High Sensitivity)     Status: None   Collection Time: 08/17/20  9:58 PM  Result Value Ref Range   Troponin I (High Sensitivity) 7 <18 ng/L    Comment: (NOTE) Elevated high sensitivity troponin I (hsTnI) values and significant  changes across serial measurements may suggest ACS but many other  chronic and acute conditions are known to elevate hsTnI results.  Refer to the "Links" section for chest pain algorithms and additional  guidance. Performed at [facility], [address], [place], [place] [address]   Urinalysis, Routine w reflex microscopic Urine, Clean Catch     Status: Abnormal   Collection Time: 08/18/20 12:12 AM  Result Value Ref Range   Color, Urine YELLOW YELLOW   APPearance HAZY (A) CLEAR   Specific Gravity, Urine 1.021 1.005 - 1.030   pH 6.0 5.0 - 8.0   Glucose, UA NEGATIVE NEGATIVE mg/dL   Hgb urine dipstick NEGATIVE NEGATIVE   Bilirubin Urine NEGATIVE NEGATIVE   Ketones, ur 80 (A) NEGATIVE mg/dL   Protein, ur 30 (A) NEGATIVE mg/dL   Nitrite NEGATIVE NEGATIVE    Leukocytes,Ua NEGATIVE NEGATIVE   RBC / HPF 0-5 0 - 5 RBC/hpf   WBC, UA 0-5 0 - 5 WBC/hpf   Bacteria, UA RARE (A) NONE SEEN   Squamous Epithelial / LPF 11-20 0 - 5   Mucus PRESENT     Comment: Performed at [facility], [address], [place], [place] [address]  Rapid urine drug screen (hospital performed)     Status: Abnormal   Collection Time: 08/18/20 12:12 AM  Result Value Ref Range   Opiates POSITIVE (A) NONE DETECTED   Cocaine NONE DETECTED NONE DETECTED   Benzodiazepines NONE DETECTED NONE DETECTED   Amphetamines NONE DETECTED NONE DETECTED   Tetrahydrocannabinol NONE DETECTED NONE DETECTED   Barbiturates NONE DETECTED NONE DETECTED    Comment: (NOTE) DRUG SCREEN FOR MEDICAL PURPOSES ONLY.  IF CONFIRMATION IS NEEDED FOR ANY PURPOSE, NOTIFY LAB WITHIN 5 DAYS.  LOWEST DETECTABLE LIMITS FOR URINE DRUG SCREEN Drug Class                     Cutoff (ng/mL) Amphetamine and metabolites    1000 Barbiturate and metabolites    200 Benzodiazepine                 200 Tricyclics and metabolites     300 Opiates and metabolites        300 Cocaine and metabolites        300 THC                            50 Performed at [facility], [address] [place], [place] [address]     Chemistries  Recent Labs  Lab 08/17/20 2027  NA 137  K 3.3*  CL 103  CO2 24  GLUCOSE 96  BUN 13  CREATININE 0.66  CALCIUM 9.2   ------------------------------------------------------------------------------------------------------------------  ------------------------------------------------------------------------------------------------------------------ GFR: CrCl cannot be calculated (Unknown ideal weight.). Liver Function Tests: No results for input(s): AST, ALT, ALKPHOS, BILITOT, PROT, ALBUMIN in the last 168 hours. No results for input(s): LIPASE, AMYLASE in the last 168 hours. No results for input(s): AMMONIA in the last 168 hours. Coagulation Profile: No results for input(s): INR,  PROTIME in the last 168 hours. Cardiac Enzymes: No results for input(s): CKTOTAL, CKMB, CKMBINDEX, TROPONINI in the last 168 hours. BNP (last 3 results) No results for input(s): PROBNP in the last 8760 hours. HbA1C: No results for input(s): HGBA1C in the last 72 hours. CBG: No results for input(s): GLUCAP in the last 168 hours. Lipid Profile: No results for input(s): CHOL, HDL, LDLCALC, TRIG, CHOLHDL, LDLDIRECT in the last 72 hours. Thyroid Function Tests: No results for input(s): TSH, T4TOTAL, FREET4, T3FREE, THYROIDAB in the last 72 hours. Anemia Panel: No results for input(s): VITAMINB12, FOLATE, FERRITIN, TIBC, IRON, RETICCTPCT in the last 72 hours.  --------------------------------------------------------------------------------------------------------------- Urine analysis:    Component Value Date/Time   COLORURINE YELLOW 08/18/2020 0012   APPEARANCEUR HAZY (A) 08/18/2020 0012   LABSPEC 1.021 08/18/2020 0012   PHURINE 6.0 08/18/2020 0012   GLUCOSEU NEGATIVE 08/18/2020 0012   HGBUR NEGATIVE 08/18/2020 0012   BILIRUBINUR NEGATIVE 08/18/2020 0012   KETONESUR 80 (A) 08/18/2020 0012   PROTEINUR 30 (A) 08/18/2020 0012   UROBILINOGEN 0.2 01/08/2013 1138   NITRITE NEGATIVE 08/18/2020 0012   LEUKOCYTESUR NEGATIVE 08/18/2020 0012      Imaging Results:    DG Chest Portable 1 View  Result Date: 08/17/2020 CLINICAL DATA:  Syncope. EXAM: PORTABLE CHEST 1 VIEW COMPARISON:  May 10, 2019 FINDINGS: The heart size and mediastinal contours are within normal limits. Both lungs are clear. The visualized skeletal structures are unremarkable. IMPRESSION: No active disease. Electronically Signed   By: Gerome Sam III M.D   On: 08/17/2020 20:28    My personal review of EKG:  Rhythm NSR, Rate89 /min, QTc 508 ,no Acute ST changes   Assessment & Plan:    Principal Problem:   Hypertensive crisis Active Problems:   Systolic CHF, acute (HCC)   Hypokalemia   Prolonged QT  interval   1. Hypertensive crisis 1. Patient is on spironolactone, Coreg, Entresto at home but does not take evening doses 2. Continue home medications 3. Blood pressure was not responsive to IV metoprolol or hydralazine 4. Nicardipine drip started-blood pressure responsive goal the diastolic blood pressure less than 100 5. Blood pressure was controlled enough that nicardipine was actually able to be held briefly, then blood pressure went back up -starting amlodipine for longer action 6. Continue to monitor 2. Systolic CHF 1. Most likely secondary to previous alcohol abuse 2. Continue home medications 3. Not in exacerbation at this time 3. Hypokalemia 1. 40 mEq potassium replaced 2. Trend in a.m. labs 4. Prolonged QT 1. Previously documented in December 2021 cardiology appointment 2. Monitor on telemetry 3. Monitor and replace electrolytes 4. Hold QT prolonging agents when possible 5.    DVT Prophylaxis-   heparin- SCDs   AM Labs Ordered, also please review Full Orders  Family Communication: No family at bedside  Code Status:  Full  Admission status: Observation Time spent in minutes : 65   Mckenna Gamm B Zierle-Ghosh DO

## 2020-08-18 NOTE — Discharge Instructions (Signed)
Hypertension, Adult Hypertension is another name for high blood pressure. High blood pressure forces your heart to work harder to pump blood. This can cause problems over time. There are two numbers in a blood pressure reading. There is a top number (systolic) over a bottom number (diastolic). It is best to have a blood pressure that is below 120/80. Healthy choices can help lower your blood pressure, or you may need medicine to help lower it. What are the causes? The cause of this condition is not known. Some conditions may be related to high blood pressure. What increases the risk?  Smoking.  Having type 2 diabetes mellitus, high cholesterol, or both.  Not getting enough exercise or physical activity.  Being overweight.  Having too much fat, sugar, calories, or salt (sodium) in your diet.  Drinking too much alcohol.  Having long-term (chronic) kidney disease.  Having a family history of high blood pressure.  Age. Risk increases with age.  Race. You may be at higher risk if you are African American.  Gender. Men are at higher risk than women before age 45. After age 65, women are at higher risk than men.  Having obstructive sleep apnea.  Stress. What are the signs or symptoms?  High blood pressure may not cause symptoms. Very high blood pressure (hypertensive crisis) may cause: ? Headache. ? Feelings of worry or nervousness (anxiety). ? Shortness of breath. ? Nosebleed. ? A feeling of being sick to your stomach (nausea). ? Throwing up (vomiting). ? Changes in how you see. ? Very bad chest pain. ? Seizures. How is this treated?  This condition is treated by making healthy lifestyle changes, such as: ? Eating healthy foods. ? Exercising more. ? Drinking less alcohol.  Your health care provider may prescribe medicine if lifestyle changes are not enough to get your blood pressure under control, and if: ? Your top number is above 130. ? Your bottom number is above  80.  Your personal target blood pressure may vary. Follow these instructions at home: Eating and drinking  If told, follow the DASH eating plan. To follow this plan: ? Fill one half of your plate at each meal with fruits and vegetables. ? Fill one fourth of your plate at each meal with whole grains. Whole grains include whole-wheat pasta, brown rice, and whole-grain bread. ? Eat or drink low-fat dairy products, such as skim milk or low-fat yogurt. ? Fill one fourth of your plate at each meal with low-fat (lean) proteins. Low-fat proteins include fish, chicken without skin, eggs, beans, and tofu. ? Avoid fatty meat, cured and processed meat, or chicken with skin. ? Avoid pre-made or processed food.  Eat less than 1,500 mg of salt each day.  Do not drink alcohol if: ? Your doctor tells you not to drink. ? You are pregnant, may be pregnant, or are planning to become pregnant.  If you drink alcohol: ? Limit how much you use to:  0-1 drink a day for women.  0-2 drinks a day for men. ? Be aware of how much alcohol is in your drink. In the [place] one drink equals one 12 oz bottle of beer (355 mL), one 5 oz glass of wine (148 mL), or one 1 oz glass of hard liquor (44 mL).   Lifestyle  Work with your doctor to stay at a healthy weight or to lose weight. Ask your doctor what the best weight is for you.  Get at least 30 minutes of exercise most   days of the week. This may include walking, swimming, or biking.  Get at least 30 minutes of exercise that strengthens your muscles (resistance exercise) at least 3 days a week. This may include lifting weights or doing Pilates.  Do not use any products that contain nicotine or tobacco, such as cigarettes, e-cigarettes, and chewing tobacco. If you need help quitting, ask your doctor.  Check your blood pressure at home as told by your doctor.  Keep all follow-up visits as told by your doctor. This is important.   Medicines  Take over-the-counter  and prescription medicines only as told by your doctor. Follow directions carefully.  Do not skip doses of blood pressure medicine. The medicine does not work as well if you skip doses. Skipping doses also puts you at risk for problems.  Ask your doctor about side effects or reactions to medicines that you should watch for. Contact a doctor if you:  Think you are having a reaction to the medicine you are taking.  Have headaches that keep coming back (recurring).  Feel dizzy.  Have swelling in your ankles.  Have trouble with your vision. Get help right away if you:  Get a very bad headache.  Start to feel mixed up (confused).  Feel weak or numb.  Feel faint.  Have very bad pain in your: ? Chest. ? Belly (abdomen).  Throw up more than once.  Have trouble breathing. Summary  Hypertension is another name for high blood pressure.  High blood pressure forces your heart to work harder to pump blood.  For most people, a normal blood pressure is less than 120/80.  Making healthy choices can help lower blood pressure. If your blood pressure does not get lower with healthy choices, you may need to take medicine. This information is not intended to replace advice given to you by your health care provider. Make sure you discuss any questions you have with your health care provider. Document Revised: 02/22/2018 Document Reviewed: 02/22/2018 Elsevier Patient Education  2021 Elsevier Inc.   Blood Pressure Record Sheet To take your blood pressure, you will need a blood pressure machine. You can buy a blood pressure machine (blood pressure monitor) at your clinic, drug store, or online. When choosing one, consider:  An automatic monitor that has an arm cuff.  A cuff that wraps snugly around your upper arm. You should be able to fit only one finger between your arm and the cuff.  A device that stores blood pressure reading results.  Do not choose a monitor that measures your  blood pressure from your wrist or finger. Follow your health care provider's instructions for how to take your blood pressure. To use this form:  Get one reading in the morning (a.m.) before you take any medicines.  Get one reading in the evening (p.m.) before supper.  Take at least 2 readings with each blood pressure check. This makes sure the results are correct. Wait 1-2 minutes between measurements.  Write down the results in the spaces on this form.  Repeat this once a week, or as told by your health care provider.  Make a follow-up appointment with your health care provider to discuss the results. Blood pressure log Date: _______________________  a.m. _____________________(1st reading) _____________________(2nd reading)  p.m. _____________________(1st reading) _____________________(2nd reading) Date: _______________________  a.m. _____________________(1st reading) _____________________(2nd reading)  p.m. _____________________(1st reading) _____________________(2nd reading) Date: _______________________  a.m. _____________________(1st reading) _____________________(2nd reading)  p.m. _____________________(1st reading) _____________________(2nd reading) Date: _______________________  a.m. _____________________(1st reading) _____________________(2nd  reading)  p.m. _____________________(1st reading) _____________________(2nd reading) Date: _______________________  a.m. _____________________(1st reading) _____________________(2nd reading)  p.m. _____________________(1st reading) _____________________(2nd reading) This information is not intended to replace advice given to you by your health care provider. Make sure you discuss any questions you have with your health care provider. Document Revised: 10/03/2019 Document Reviewed: 10/03/2019 Elsevier Patient Education  2021 Elsevier Inc.   How to Take Your Blood Pressure Blood pressure measures how strongly your blood is  pressing against the walls of your arteries. Arteries are blood vessels that carry blood from your heart throughout your body. You can take your blood pressure at home with a machine. You may need to check your blood pressure at home:  To check if you have high blood pressure (hypertension).  To check your blood pressure over time.  To make sure your blood pressure medicine is working. Supplies needed:  Blood pressure machine, or monitor.  Dining room chair to sit in.  Table or desk.  Small notebook.  Pencil or pen. How to prepare Avoid these things for 30 minutes before checking your blood pressure:  Having drinks with caffeine in them, such as coffee or tea.  Drinking alcohol.  Eating.  Smoking.  Exercising. Do these things five minutes before checking your blood pressure:  Go to the bathroom and pee (urinate).  Sit in a dining chair. Do not sit in a soft couch or an armchair.  Be quiet. Do not talk. How to take your blood pressure Follow the instructions that came with your machine. If you have a digital blood pressure monitor, these may be the instructions: 1. Sit up straight. 2. Place your feet on the floor. Do not cross your ankles or legs. 3. Rest your left arm at the level of your heart. You may rest it on a table, desk, or chair. 4. Pull up your shirt sleeve. 5. Wrap the blood pressure cuff around the upper part of your left arm. The cuff should be 1 inch (2.5 cm) above your elbow. It is best to wrap the cuff around bare skin. 6. Fit the cuff snugly around your arm. You should be able to place only one finger between the cuff and your arm. 7. Place the cord so that it rests in the bend of your elbow. 8. Press the power button. 9. Sit quietly while the cuff fills with air and loses air. 10. Write down the numbers on the screen. 11. Wait 2-3 minutes and then repeat steps 1-10.   What do the numbers mean? Two numbers make up your blood pressure. The first  number is called systolic pressure. The second is called diastolic pressure. An example of a blood pressure reading is "120 over 80" (or 120/80). If you are an adult and do not have a medical condition, use this guide to find out if your blood pressure is normal: Normal  First number: below 120.  Second number: below 80. Elevated  First number: 120-129.  Second number: below 80. Hypertension stage 1  First number: 130-139.  Second number: 80-89. Hypertension stage 2  First number: 140 or above.  Second number: 90 or above. Your blood pressure is above normal even if only the top or bottom number is above normal. Follow these instructions at home:  Check your blood pressure as often as your doctor tells you to.  Check your blood pressure at the same time every day.  Take your monitor to your next doctor's appointment. Your doctor will: ? Make  sure you are using it correctly. ? Make sure it is working right.  Make sure you understand what your blood pressure numbers should be.  Tell your doctor if your medicine is causing side effects.  Keep all follow-up visits as told by your doctor. This is important. General tips:  You will need a blood pressure machine, or monitor. Your doctor can suggest a monitor. You can buy one at a drugstore or online. When choosing one: ? Choose one with an arm cuff. ? Choose one that wraps around your upper arm. Only one finger should fit between your arm and the cuff. ? Do not choose one that measures your blood pressure from your wrist or finger. Where to find more information American Heart Association: www.heart.org Contact a doctor if:  Your blood pressure keeps being high. Get help right away if:  Your first blood pressure number is higher than 180.  Your second blood pressure number is higher than 120. Summary  Check your blood pressure at the same time every day.  Avoid caffeine, alcohol, smoking, and exercise for 30 minutes  before checking your blood pressure.  Make sure you understand what your blood pressure numbers should be. This information is not intended to replace advice given to you by your health care provider. Make sure you discuss any questions you have with your health care provider. Document Revised: 06/08/2019 Document Reviewed: 06/08/2019 Elsevier Patient Education  2021 [employer].

## 2020-08-19 MED ORDER — ENTRESTO 97-103 MG PO TABS: 1.0000 | ORAL_TABLET | Freq: Two times a day (BID) | ORAL | 3 refills | Status: DC

## 2020-08-19 NOTE — Discharge Instructions (Signed)
Please check your blood pressure once a day and keep a record of it.  Take that record with you when you see your cardiologist.  If you are concerned about a blood pressure reading, please just recheck your blood pressure and 20-99minutes.  If you are still concerned, you are welcome to come in for reevaluation at any time.

## 2020-08-19 NOTE — ED Provider Notes (Signed)
[facility] EMERGENCY DEPARTMENT Provider Note   CSN: [id number] Arrival date & time: 08/18/20  2234   History Chief Complaint  Patient presents with  . Hypertension    Tina Fletcher is a 35 y.o. female.  The history is provided by the patient.  Hypertension  She has history of hypertension, systolic heart failure and comes in because of elevated blood pressure.  She had been admitted to the hospital for elevated blood pressure and was discharged yesterday.  She checked her blood pressure at a pharmacy and it was 158/100 and she was concerned and came in.  She denies any chest pain, heaviness, tightness, pressure.  She denies headache.  She denies tinnitus.  She denies epistaxis.  She has been compliant with her medications.  Past Medical History:  Diagnosis Date  . Abnormal menstrual periods 02/12/2014  . CHF (congestive heart failure) (HCC)   . Herpes simplex without mention of complication   . Hypertension   . Medical history non-contributory   . Migraines   . Nexplanon insertion 08/13/2013   08/13/13 inserted left arm, remove 08/13/16  . Vitamin D deficiency 04/01/2016    Patient Active Problem List   Diagnosis Date Noted  . Hypertensive crisis 08/18/2020  . Hypokalemia 08/18/2020  . Prolonged QT interval 08/18/2020  . Syncope   . Chronic systolic CHF (congestive heart failure) (HCC)   . Cervical cancer screening 10/23/2019  . Systolic CHF, acute (HCC) 05/10/2019  . Vitamin D deficiency 04/01/2016  . Nexplanon insertion 08/13/2013  . Thrombocytopenia (HCC) 04/17/2013  . 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 Underweight 01/08/2013    Past Surgical History:  Procedure Laterality Date  . NO PAST SURGERIES    . RIGHT/LEFT HEART CATH AND CORONARY ANGIOGRAPHY N/A 05/14/2019   Procedure: RIGHT/LEFT HEART CATH AND CORONARY ANGIOGRAPHY;  Surgeon: Laurey Morale, MD;  Location: [facility] INVASIVE CV LAB;  Service: Cardiovascular;  Laterality: N/A;     OB History    Gravida   2   Para  2   Term  2   Preterm      AB      Living  2     SAB      IAB      Ectopic      Multiple      Live Births  2           Family History  Problem Relation Age of Onset  . Diabetes Maternal Grandmother   . Cancer Paternal Grandfather        prancreatic  . COPD Maternal Grandfather   . Cancer Maternal Grandfather        prostate  . Asthma Maternal Grandfather   . Asthma Son   . Bronchitis Son     Social History   Tobacco Use  . Smoking status: Never Smoker  . Smokeless tobacco: Never Used  Vaping Use  . Vaping Use: Never used  Substance Use Topics  . Alcohol use: Yes    Comment: occ  . Drug use: No    Home Medications Prior to Admission medications   Medication Sig Start Date End Date Taking? Authorizing Provider  acetaminophen (TYLENOL) 325 MG tablet Take 650 mg by mouth every 6 (six) hours as needed.    [provider]  carvedilol (COREG) 25 MG tablet Take 1 tablet (25 mg total) by mouth 2 (two) times daily with a meal. 07/01/20   Laurey Morale, MD  ENTRESTO 97-103 MG TAKE (1) TABLET BY  MOUTH TWICE DAILY. Patient taking differently: Take 1 tablet by mouth 2 (two) times daily. 08/11/20   Laurey Morale, MD  etonogestrel (NEXPLANON) 68 MG IMPL implant Inject 1 each into the skin once.    [provider]  ivabradine (CORLANOR) 5 MG TABS tablet Take 0.5 tablets (2.5 mg total) by mouth 2 (two) times daily with a meal. 07/01/20   Laurey Morale, MD  potassium chloride (KLOR-CON) 10 MEQ tablet Take 2 tablets (20 mEq total) by mouth daily. 08/02/19   Laurey Morale, MD  spironolactone (ALDACTONE) 50 MG tablet TAKE (1) TABLET BY MOUTH DAILY 06/10/20   Laurey Morale, MD    Allergies    Patient has no known allergies.  Review of Systems   Review of Systems  All other systems reviewed and are negative.   Physical Exam Updated Vital Signs BP (!) 150/100   Pulse 79   Temp 98.4 F (36.9 C) (Oral)   Resp 18   Ht 5\' 9"   (1.753 m)   Wt 59.9 kg   SpO2 100%   BMI 19.49 kg/m   Physical Exam Vitals and nursing note reviewed.   35 year old female, resting comfortably and in no acute distress. Vital signs are normal. Oxygen saturation is 99%, which is normal. Head is normocephalic and atraumatic. PERRLA, EOMI. Oropharynx is clear. Neck is nontender and supple without adenopathy or JVD. Back is nontender and there is no CVA tenderness. Lungs are clear without rales, wheezes, or rhonchi. Chest is nontender. Heart has regular rate and rhythm without murmur. Abdomen is soft, flat, nontender without masses or hepatosplenomegaly and peristalsis is normoactive. Extremities have no cyanosis or edema, full range of motion is present. Skin is warm and dry without rash. Neurologic: Mental status is normal, cranial nerves are intact, there are no motor or sensory deficits.  ED Results / Procedures / Treatments    Procedures Procedures   Medications Ordered in ED Medications - No data to display  ED Course  I have reviewed the triage vital signs and the nursing notes.  MDM Rules/Calculators/A&P Elevated blood pressure as an outpatient.  Blood pressure is initially mildly elevated, but has come back down to the lower end of the normal range with simple observation.  No evidence of any endorgan damage.  Old records are reviewed confirming recent hospital stay for hypertensive urgency.  At this point, no indication for any further testing.  Patient is reassured that her blood pressure has come back down to normal, encouraged to monitor blood pressure routinely at home and discuss with her physician if she has concerns.  However advised that she is welcome to return to the emergency department as needed.  Final Clinical Impression(s) / ED Diagnoses Final diagnoses:  Elevated blood pressure reading with diagnosis of hypertension    Rx / DC Orders ED Discharge Orders    None       34, MD 08/19/20  [phone]

## 2020-08-20 ENCOUNTER — Emergency Department (HOSPITAL_COMMUNITY): Payer: Medicaid Other

## 2020-08-20 ENCOUNTER — Emergency Department (HOSPITAL_COMMUNITY)
Admission: EM | Admit: 2020-08-20 | Discharge: 2020-08-21 | Disposition: A | Discharge status: Home / Self Care | Payer: Medicaid Other | Attending: Emergency Medicine | Admitting: Emergency Medicine

## 2020-08-20 ENCOUNTER — Other Ambulatory Visit: Payer: Self-pay

## 2020-08-20 ENCOUNTER — Encounter (HOSPITAL_COMMUNITY): Payer: Self-pay

## 2020-08-20 DIAGNOSIS — R030 Elevated blood-pressure reading, without diagnosis of hypertension: Secondary | ICD-10-CM | POA: Insufficient documentation

## 2020-08-20 DIAGNOSIS — I5022 Chronic systolic (congestive) heart failure: Secondary | ICD-10-CM | POA: Insufficient documentation

## 2020-08-20 DIAGNOSIS — I110 Hypertensive heart disease with heart failure: Secondary | ICD-10-CM | POA: Insufficient documentation

## 2020-08-20 DIAGNOSIS — R079 Chest pain, unspecified: Secondary | ICD-10-CM

## 2020-08-20 DIAGNOSIS — Z79899 Other long term (current) drug therapy: Secondary | ICD-10-CM | POA: Diagnosis not present

## 2020-08-20 DIAGNOSIS — E876 Hypokalemia: Secondary | ICD-10-CM | POA: Diagnosis not present

## 2020-08-20 DIAGNOSIS — D6489 Other specified anemias: Secondary | ICD-10-CM | POA: Diagnosis not present

## 2020-08-20 DIAGNOSIS — R0789 Other chest pain: Secondary | ICD-10-CM | POA: Diagnosis not present

## 2020-08-20 DIAGNOSIS — D649 Anemia, unspecified: Secondary | ICD-10-CM

## 2020-08-20 DIAGNOSIS — I10 Essential (primary) hypertension: Secondary | ICD-10-CM

## 2020-08-20 LAB — CBC
HCT: 35.8 % — ABNORMAL LOW (ref 36.0–46.0)
Hemoglobin: 11.5 g/dL — ABNORMAL LOW (ref 12.0–15.0)
MCH: 29.3 pg (ref 26.0–34.0)
MCHC: 32.1 g/dL (ref 30.0–36.0)
MCV: 91.3 fL (ref 80.0–100.0)
Platelets: 204 10*3/uL (ref 150–400)
RBC: 3.92 MIL/uL (ref 3.87–5.11)
RDW: 13.7 % (ref 11.5–15.5)
WBC: 5.1 10*3/uL (ref 4.0–10.5)
nRBC: 0 % (ref 0.0–0.2)

## 2020-08-20 LAB — BASIC METABOLIC PANEL
Anion gap: 12 (ref 5–15)
BUN: 14 mg/dL (ref 6–20)
CO2: 22 mmol/L (ref 22–32)
Calcium: 9.3 mg/dL (ref 8.9–10.3)
Chloride: 103 mmol/L (ref 98–111)
Creatinine, Ser: 0.79 mg/dL (ref 0.44–1.00)
GFR, Estimated: >60 mL/min (ref >60)
Glucose, Bld: 114 mg/dL — ABNORMAL HIGH (ref 70–99)
Potassium: 3.4 mmol/L — ABNORMAL LOW (ref 3.5–5.1)
Sodium: 137 mmol/L (ref 135–145)

## 2020-08-20 LAB — TROPONIN I (HIGH SENSITIVITY)
Troponin I (High Sensitivity): 4 ng/L (ref <18)
Troponin I (High Sensitivity): 2 ng/L (ref <18)

## 2020-08-20 MED ORDER — POTASSIUM CHLORIDE CRYS ER 20 MEQ PO TBCR: 40.0000 meq | EXTENDED_RELEASE_TABLET | Freq: Once | ORAL | Status: DC

## 2020-08-20 NOTE — ED Provider Notes (Incomplete)
[facility] EMERGENCY DEPARTMENT Provider Note   CSN: [id number] Arrival date & time: 08/20/20  2022   History Chief Complaint  Patient presents with  . Chest Pain    Tina Fletcher is a 35 y.o. female.  The history is provided by the patient.  Chest Pain   Past Medical History:  Diagnosis Date  . Abnormal menstrual periods 02/12/2014  . CHF (congestive heart failure) (HCC)   . Herpes simplex without mention of complication   . Hypertension   . Medical history non-contributory   . Migraines   . Nexplanon insertion 08/13/2013   08/13/13 inserted left arm, remove 08/13/16  . Vitamin D deficiency 04/01/2016    Patient Active Problem List   Diagnosis Date Noted  . Hypertensive crisis 08/18/2020  . Hypokalemia 08/18/2020  . Prolonged QT interval 08/18/2020  . Syncope   . Chronic systolic CHF (congestive heart failure) (HCC)   . Cervical cancer screening 10/23/2019  . Systolic CHF, acute (HCC) 05/10/2019  . Vitamin D deficiency 04/01/2016  . Nexplanon insertion 08/13/2013  . Thrombocytopenia (HCC) 04/17/2013  . 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 Underweight 01/08/2013    Past Surgical History:  Procedure Laterality Date  . RIGHT/LEFT HEART CATH AND CORONARY ANGIOGRAPHY N/A 05/14/2019   Procedure: RIGHT/LEFT HEART CATH AND CORONARY ANGIOGRAPHY;  Surgeon: Laurey Morale, MD;  Location: [facility] INVASIVE CV LAB;  Service: Cardiovascular;  Laterality: N/A;     OB History    Gravida  2   Para  2   Term  2   Preterm      AB      Living  2     SAB      IAB      Ectopic      Multiple      Live Births  2           Family History  Problem Relation Age of Onset  . Diabetes Maternal Grandmother   . Cancer Paternal Grandfather        prancreatic  . COPD Maternal Grandfather   . Cancer Maternal Grandfather        prostate  . Asthma Maternal Grandfather   . Asthma Son   . Bronchitis Son     Social History   Tobacco Use  . Smoking status: Never  Smoker  . Smokeless tobacco: Never Used  Vaping Use  . Vaping Use: Never used  Substance Use Topics  . Alcohol use: Yes    Comment: occ  . Drug use: No    Home Medications Prior to Admission medications   Medication Sig Start Date End Date Taking? Authorizing Provider  acetaminophen (TYLENOL) 325 MG tablet Take 650 mg by mouth every 6 (six) hours as needed.    [provider]  carvedilol (COREG) 25 MG tablet Take 1 tablet (25 mg total) by mouth 2 (two) times daily with a meal. 07/01/20   Laurey Morale, MD  etonogestrel (NEXPLANON) 68 MG IMPL implant Inject 1 each into the skin once.    [provider]  ivabradine (CORLANOR) 5 MG TABS tablet Take 0.5 tablets (2.5 mg total) by mouth 2 (two) times daily with a meal. 07/01/20   Laurey Morale, MD  potassium chloride (KLOR-CON) 10 MEQ tablet Take 2 tablets (20 mEq total) by mouth daily. 08/02/19   Laurey Morale, MD  sacubitril-valsartan (ENTRESTO) 97-103 MG Take 1 tablet by mouth 2 (two) times daily. 08/19/20   Laurey Morale, MD  spironolactone (ALDACTONE) 50 MG tablet TAKE (1) TABLET BY MOUTH DAILY 06/10/20   Laurey Morale, MD    Allergies    Patient has no known allergies.  Review of Systems   Review of Systems  Cardiovascular: Positive for chest pain.  All other systems reviewed and are negative.   Physical Exam Updated Vital Signs BP (!) 166/109 (BP Location: Right Arm)   Pulse 89   Temp 98.3 F (36.8 C) (Oral)   Resp 18   Ht 5\' 9"  (1.753 m)   Wt 56.2 kg   SpO2 100%   BMI 18.31 kg/m   Physical Exam Vitals and nursing note reviewed.   *** year old ***female, resting comfortably and in no acute distress. Vital signs are ***. Oxygen saturation is ***%, which is normal. Head is normocephalic and atraumatic. PERRLA, EOMI. Oropharynx is clear. Neck is nontender and supple without adenopathy or JVD. Back is nontender and there is no CVA tenderness. Lungs are clear without rales, wheezes, or  rhonchi. Chest is nontender. Heart has regular rate and rhythm without murmur. Abdomen is soft, flat, nontender without masses or hepatosplenomegaly and peristalsis is normoactive. Extremities have no cyanosis or edema, full range of motion is present. Skin is warm and dry without rash. Neurologic: Mental status is normal, cranial nerves are intact, there are no motor or sensory deficits.  ED Results / Procedures / Treatments   Labs (all labs ordered are listed, but only abnormal results are displayed) Labs Reviewed  BASIC METABOLIC PANEL - Abnormal; Notable for the following components:      Result Value   Potassium 3.4 (*)    Glucose, Bld 114 (*)    All other components within normal limits  CBC - Abnormal; Notable for the following components:   Hemoglobin 11.5 (*)    HCT 35.8 (*)    All other components within normal limits  TROPONIN I (HIGH SENSITIVITY)  TROPONIN I (HIGH SENSITIVITY)    EKG EKG Interpretation  Date/Time:  Wednesday August 20 2020 20:25:32 EST Ventricular Rate:  88 PR Interval:  156 QRS Duration: 82 QT Interval:  410 QTC Calculation: 496 R Axis:   68 Text Interpretation: Normal sinus rhythm Possible Left atrial enlargement Nonspecific ST abnormality Prolonged QT Abnormal ECG When compared with ECG of 08/17/2020, No significant change was found Confirmed by 08/19/2020 (Dione Booze) on 08/20/2020 11:48:55 PM   Radiology DG Chest Port 1 View  Result Date: 08/20/2020 CLINICAL DATA:  Chest pain and cough EXAM: PORTABLE CHEST 1 VIEW COMPARISON:  08/17/2020 FINDINGS: The heart size and mediastinal contours are within normal limits. Both lungs are clear. The visualized skeletal structures are unremarkable. IMPRESSION: No active disease. Electronically Signed   By: 08/19/2020 M.D.   On: 08/20/2020 21:08    Procedures Procedures {Remember to document critical care time when appropriate:1}  Medications Ordered in ED Medications - No data to display  ED  Course  I have reviewed the triage vital signs and the nursing notes.  Pertinent labs & imaging results that were available during my care of the patient were reviewed by me and considered in my medical decision making (see chart for details).    MDM Rules/Calculators/A&P                          *** Final Clinical Impression(s) / ED Diagnoses Final diagnoses:  Chest pain    Rx / DC Orders ED Discharge Orders    None

## 2020-08-20 NOTE — ED Provider Notes (Signed)
[facility] EMERGENCY DEPARTMENT Provider Note   CSN: [id number] Arrival date & time: 08/20/20  2022   History Chief Complaint  Patient presents with  . Chest Pain    Tina Fletcher is a 35 y.o. female.  The history is provided by the patient.  Chest Pain She has history of hypertension, systolic heart failure, hypokalemia and comes in because of an episode of chest pain.  Chest pain occurred while doing normal activities it was a dull sensation in the midsternal area without radiation.  She rated it at 6/10.  Nothing made it worse, but it was better if she went outside into fresh air.  She denies dyspnea, nausea, diaphoresis.  Pain lasted about 20 minutes before resolving spontaneously and has not recurred.  She is a non-smoker and denies history of diabetes or hyperlipidemia.  There is no history of premature coronary atherosclerosis in first-degree relatives.  Past Medical History:  Diagnosis Date  . Abnormal menstrual periods 02/12/2014  . CHF (congestive heart failure) (HCC)   . Herpes simplex without mention of complication   . Hypertension   . Medical history non-contributory   . Migraines   . Nexplanon insertion 08/13/2013   08/13/13 inserted left arm, remove 08/13/16  . Vitamin D deficiency 04/01/2016    Patient Active Problem List   Diagnosis Date Noted  . Hypertensive crisis 08/18/2020  . Hypokalemia 08/18/2020  . Prolonged QT interval 08/18/2020  . Syncope   . Chronic systolic CHF (congestive heart failure) (HCC)   . Cervical cancer screening 10/23/2019  . Systolic CHF, acute (HCC) 05/10/2019  . Vitamin D deficiency 04/01/2016  . Nexplanon insertion 08/13/2013  . Thrombocytopenia (HCC) 04/17/2013  . 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 Underweight 01/08/2013    Past Surgical History:  Procedure Laterality Date  . RIGHT/LEFT HEART CATH AND CORONARY ANGIOGRAPHY N/A 05/14/2019   Procedure: RIGHT/LEFT HEART CATH AND CORONARY ANGIOGRAPHY;  Surgeon: Laurey Morale, MD;  Location: [facility] INVASIVE CV LAB;  Service: Cardiovascular;  Laterality: N/A;     OB History    Gravida  2   Para  2   Term  2   Preterm      AB      Living  2     SAB      IAB      Ectopic      Multiple      Live Births  2           Family History  Problem Relation Age of Onset  . Diabetes Maternal Grandmother   . Cancer Paternal Grandfather        prancreatic  . COPD Maternal Grandfather   . Cancer Maternal Grandfather        prostate  . Asthma Maternal Grandfather   . Asthma Son   . Bronchitis Son     Social History   Tobacco Use  . Smoking status: Never Smoker  . Smokeless tobacco: Never Used  Vaping Use  . Vaping Use: Never used  Substance Use Topics  . Alcohol use: Yes    Comment: occ  . Drug use: No    Home Medications Prior to Admission medications   Medication Sig Start Date End Date Taking? Authorizing Provider  acetaminophen (TYLENOL) 325 MG tablet Take 650 mg by mouth every 6 (six) hours as needed.    [provider]  carvedilol (COREG) 25 MG tablet Take 1 tablet (25 mg total) by mouth 2 (two) times daily with a  meal. 07/01/20   Laurey Morale, MD  etonogestrel (NEXPLANON) 68 MG IMPL implant Inject 1 each into the skin once.    [provider]  ivabradine (CORLANOR) 5 MG TABS tablet Take 0.5 tablets (2.5 mg total) by mouth 2 (two) times daily with a meal. 07/01/20   Laurey Morale, MD  potassium chloride (KLOR-CON) 10 MEQ tablet Take 2 tablets (20 mEq total) by mouth daily. 08/02/19   Laurey Morale, MD  sacubitril-valsartan (ENTRESTO) 97-103 MG Take 1 tablet by mouth 2 (two) times daily. 08/19/20   Laurey Morale, MD  spironolactone (ALDACTONE) 50 MG tablet TAKE (1) TABLET BY MOUTH DAILY 06/10/20   Laurey Morale, MD    Allergies    Patient has no known allergies.  Review of Systems   Review of Systems  Cardiovascular: Positive for chest pain.  All other systems reviewed and are negative.   Physical  Exam Updated Vital Signs BP (!) 166/109 (BP Location: Right Arm)   Pulse 89   Temp 98.3 F (36.8 C) (Oral)   Resp 18   Ht 5\' 9"  (1.753 m)   Wt 56.2 kg   SpO2 100%   BMI 18.31 kg/m   Physical Exam Vitals and nursing note reviewed.   35 year old female, resting comfortably and in no acute distress. Vital signs are significant for elevated blood pressure. Oxygen saturation is 100%, which is normal. Head is normocephalic and atraumatic. PERRLA, EOMI. Oropharynx is clear. Neck is nontender and supple without adenopathy or JVD. Back is nontender and there is no CVA tenderness. Lungs are clear without rales, wheezes, or rhonchi. Chest is nontender. Heart has regular rate and rhythm without murmur. Abdomen is soft, flat, nontender without masses or hepatosplenomegaly and peristalsis is normoactive. Extremities have no cyanosis or edema, full range of motion is present. Skin is warm and dry without rash. Neurologic: Mental status is normal, cranial nerves are intact, there are no motor or sensory deficits.  ED Results / Procedures / Treatments   Labs (all labs ordered are listed, but only abnormal results are displayed) Labs Reviewed  BASIC METABOLIC PANEL - Abnormal; Notable for the following components:      Result Value   Potassium 3.4 (*)    Glucose, Bld 114 (*)    All other components within normal limits  CBC - Abnormal; Notable for the following components:   Hemoglobin 11.5 (*)    HCT 35.8 (*)    All other components within normal limits  TROPONIN I (HIGH SENSITIVITY)  TROPONIN I (HIGH SENSITIVITY)    EKG EKG Interpretation  Date/Time:  Wednesday August 20 2020 20:25:32 EST Ventricular Rate:  88 PR Interval:  156 QRS Duration: 82 QT Interval:  410 QTC Calculation: 496 R Axis:   68 Text Interpretation: Normal sinus rhythm Possible Left atrial enlargement Nonspecific ST abnormality Prolonged QT Abnormal ECG When compared with ECG of 08/17/2020, No significant  change was found Confirmed by 08/19/2020 (Dione Booze) on 08/20/2020 11:48:55 PM   Radiology DG Chest Port 1 View  Result Date: 08/20/2020 CLINICAL DATA:  Chest pain and cough EXAM: PORTABLE CHEST 1 VIEW COMPARISON:  08/17/2020 FINDINGS: The heart size and mediastinal contours are within normal limits. Both lungs are clear. The visualized skeletal structures are unremarkable. IMPRESSION: No active disease. Electronically Signed   By: 08/19/2020 M.D.   On: 08/20/2020 21:08    Procedures Procedures   Medications Ordered in ED Medications  potassium chloride SA (KLOR-CON) CR tablet  40 mEq (has no administration in time range)    ED Course  I have reviewed the triage vital signs and the nursing notes.  Pertinent labs & imaging results that were available during my care of the patient were reviewed by me and considered in my medical decision making (see chart for details).  MDM Rules/Calculators/A&P Nonspecific chest pain but without any concerning aspects.  Elevated blood pressure.  Old records were reviewed, and she had been admitted for hypertensive urgency earlier this week, and had a follow-up ED visit 2 days ago for elevated blood pressure.  Blood pressure today is elevated but not to a dangerous level.  ECG is unchanged from prior, chest x-ray shows no acute process.  Labs are significant for mild hypokalemia but troponin is normal x2.  She is given a dose of oral potassium.  Heart pathway patient risk score is 1 which puts her at low risk for major adverse cardiac events in the next 6 weeks.  Patient was reassured of the likely benign nature of her pain.  She does have an appointment with her cardiologist in about 10 days and she is to keep that appointment.  She is to continue monitoring her blood pressure at home.  Return precautions discussed.  Final Clinical Impression(s) / ED Diagnoses Final diagnoses:  Nonspecific chest pain  Hypokalemia  Elevated blood pressure reading with  diagnosis of hypertension  Normochromic normocytic anemia    Rx / DC Orders ED Discharge Orders    None       Dione Booze, MD 08/21/20 0020

## 2020-08-20 NOTE — ED Triage Notes (Signed)
Pt to er, pt states that she is here for some chest pain, pt states that her chest pain has been off and on since this am.  Pt states that she has a cough.  States that she was here the other day for her blood pressure, states that her pressure is better.

## 2020-08-21 NOTE — Discharge Instructions (Signed)
Continue to monitor your blood pressure at home.  Continue to keep a record of it.  Please remember to take this record with you when you see your cardiologist.  Try to stay on a low-salt diet.  This will help keep your blood pressure under control.

## 2020-08-21 NOTE — ED Notes (Signed)
Bp was elevated DR. Preston Fleeting aware. Tina Fletcher

## 2020-08-22 ENCOUNTER — Other Ambulatory Visit: Payer: Self-pay

## 2020-08-22 ENCOUNTER — Ambulatory Visit (HOSPITAL_COMMUNITY)
Admission: RE | Admit: 2020-08-22 | Discharge: 2020-08-22 | Disposition: A | Discharge status: Home / Self Care | Payer: Medicaid Other | Source: Ambulatory Visit | Attending: Cardiology | Admitting: Cardiology

## 2020-08-22 ENCOUNTER — Emergency Department (HOSPITAL_COMMUNITY)
Admission: EM | Admit: 2020-08-22 | Discharge: 2020-08-22 | Disposition: A | Discharge status: Home / Self Care | Payer: Medicaid Other | Attending: Emergency Medicine | Admitting: Emergency Medicine

## 2020-08-22 ENCOUNTER — Encounter (HOSPITAL_COMMUNITY): Payer: Self-pay | Admitting: Emergency Medicine

## 2020-08-22 ENCOUNTER — Encounter (HOSPITAL_COMMUNITY): Payer: Self-pay | Admitting: Cardiology

## 2020-08-22 VITALS — BP 130/96 | HR 82 | Wt 120.4 lb

## 2020-08-22 DIAGNOSIS — Z79899 Other long term (current) drug therapy: Secondary | ICD-10-CM | POA: Diagnosis not present

## 2020-08-22 DIAGNOSIS — R079 Chest pain, unspecified: Secondary | ICD-10-CM | POA: Diagnosis not present

## 2020-08-22 DIAGNOSIS — Z8616 Personal history of COVID-19: Secondary | ICD-10-CM | POA: Diagnosis not present

## 2020-08-22 DIAGNOSIS — I5023 Acute on chronic systolic (congestive) heart failure: Secondary | ICD-10-CM | POA: Diagnosis not present

## 2020-08-22 DIAGNOSIS — I110 Hypertensive heart disease with heart failure: Secondary | ICD-10-CM | POA: Diagnosis not present

## 2020-08-22 DIAGNOSIS — R002 Palpitations: Secondary | ICD-10-CM | POA: Insufficient documentation

## 2020-08-22 DIAGNOSIS — R55 Syncope and collapse: Secondary | ICD-10-CM | POA: Diagnosis not present

## 2020-08-22 DIAGNOSIS — E876 Hypokalemia: Secondary | ICD-10-CM | POA: Diagnosis not present

## 2020-08-22 DIAGNOSIS — I5022 Chronic systolic (congestive) heart failure: Secondary | ICD-10-CM

## 2020-08-22 DIAGNOSIS — Z7901 Long term (current) use of anticoagulants: Secondary | ICD-10-CM | POA: Diagnosis not present

## 2020-08-22 DIAGNOSIS — F411 Generalized anxiety disorder: Secondary | ICD-10-CM | POA: Insufficient documentation

## 2020-08-22 DIAGNOSIS — F1010 Alcohol abuse, uncomplicated: Secondary | ICD-10-CM | POA: Insufficient documentation

## 2020-08-22 LAB — BASIC METABOLIC PANEL
Anion gap: 8 (ref 5–15)
BUN: 11 mg/dL (ref 6–20)
CO2: 25 mmol/L (ref 22–32)
Calcium: 9.4 mg/dL (ref 8.9–10.3)
Chloride: 104 mmol/L (ref 98–111)
Creatinine, Ser: 0.57 mg/dL (ref 0.44–1.00)
GFR, Estimated: >60 mL/min (ref >60)
Glucose, Bld: 114 mg/dL — ABNORMAL HIGH (ref 70–99)
Potassium: 3.3 mmol/L — ABNORMAL LOW (ref 3.5–5.1)
Sodium: 137 mmol/L (ref 135–145)

## 2020-08-22 LAB — CBC WITH DIFFERENTIAL/PLATELET
Abs Immature Granulocytes: 0 10*3/uL (ref 0.00–0.07)
Basophils Absolute: 0.1 10*3/uL (ref 0.0–0.1)
Basophils Relative: 1 %
Eosinophils Absolute: 0.1 10*3/uL (ref 0.0–0.5)
Eosinophils Relative: 1 %
HCT: 36.7 % (ref 36.0–46.0)
Hemoglobin: 11.9 g/dL — ABNORMAL LOW (ref 12.0–15.0)
Immature Granulocytes: 0 %
Lymphocytes Relative: 33 %
Lymphs Abs: 1.5 10*3/uL (ref 0.7–4.0)
MCH: 29.6 pg (ref 26.0–34.0)
MCHC: 32.4 g/dL (ref 30.0–36.0)
MCV: 91.3 fL (ref 80.0–100.0)
Monocytes Absolute: 0.6 10*3/uL (ref 0.1–1.0)
Monocytes Relative: 13 %
Neutro Abs: 2.4 10*3/uL (ref 1.7–7.7)
Neutrophils Relative %: 52 %
Platelets: 204 10*3/uL (ref 150–400)
RBC: 4.02 MIL/uL (ref 3.87–5.11)
RDW: 13.5 % (ref 11.5–15.5)
WBC: 4.5 10*3/uL (ref 4.0–10.5)
nRBC: 0 % (ref 0.0–0.2)

## 2020-08-22 LAB — CBG MONITORING, ED: Glucose-Capillary: 98 mg/dL (ref 70–99)

## 2020-08-22 LAB — POC URINE PREG, ED: Preg Test, Ur: NEGATIVE

## 2020-08-22 MED ORDER — LORAZEPAM 1 MG PO TABS
1.0000 mg | ORAL_TABLET | Freq: Once | ORAL | Status: AC
  Administered 2020-08-22: 1 mg via ORAL
  Filled 2020-08-22: qty 1

## 2020-08-22 MED ORDER — AMLODIPINE BESYLATE 5 MG PO TABS: 5.0000 mg | ORAL_TABLET | Freq: Every day | ORAL | 11 refills | Status: DC

## 2020-08-22 MED ORDER — POTASSIUM CHLORIDE CRYS ER 20 MEQ PO TBCR
40.0000 meq | EXTENDED_RELEASE_TABLET | Freq: Once | ORAL | Status: AC
  Administered 2020-08-22: 40 meq via ORAL
  Filled 2020-08-22: qty 2

## 2020-08-22 MED ORDER — POTASSIUM CHLORIDE CRYS ER 20 MEQ PO TBCR: 20.0000 meq | EXTENDED_RELEASE_TABLET | Freq: Every day | ORAL | 3 refills | Status: DC

## 2020-08-22 MED ORDER — SERTRALINE HCL 25 MG PO TABS: 25.0000 mg | ORAL_TABLET | Freq: Every day | ORAL | 2 refills | Status: DC

## 2020-08-22 NOTE — Discharge Instructions (Signed)
Follow up with your cardiologist as planned. Take your medications AS DIRECTED!  Get help right away if you: Have a seizure. Have unusual pain in your chest, abdomen, or back. Faint once or repeatedly. Have a severe headache. Are bleeding from your mouth or rectum, or you have black or tarry stool. Have a very fast or irregular heartbeat  (take your pulse to verify). Are confused. Have trouble walking. Have severe weakness. Have vision problems.

## 2020-08-22 NOTE — ED Provider Notes (Signed)
[facility] EMERGENCY DEPARTMENT Provider Note   CSN: [id number] Arrival date & time: 08/22/20  1929     History Chief Complaint  Patient presents with  . Hypertension    Tina Fletcher is a 35 y.o. female who presents to the ED with cc of Near syncope. She  has a past medical history of Abnormal menstrual periods (02/12/2014), CHF (congestive heart failure) (HCC), Herpes simplex without mention of complication, Hypertension, Medical history non-contributory, Migraines, Nexplanon insertion (08/13/2013), and Vitamin D deficiency (04/01/2016). The patient saw her Cardiologist today and had some medication changes. This evening she took her blood pressure and it she had a systolic in the 130's. She did not want to take the new medication she was prescribed because she ws concerned that it would drop her pressure. The patient states that she was getting her hair done and when she stoop up she felt ear palpitations and began to feel that she would pass out. She denies cp. She was able to li down on the bed and put acold towel on her head and drank water. She did not fully lose consciousness. She is scheduled to werar e heart monitor on her return visit to cardiology on 3/4. She has had 3 near syncopal events to date.  HPI     Past Medical History:  Diagnosis Date  . Abnormal menstrual periods 02/12/2014  . CHF (congestive heart failure) (HCC)   . Herpes simplex without mention of complication   . Hypertension   . Medical history non-contributory   . Migraines   . Nexplanon insertion 08/13/2013   08/13/13 inserted left arm, remove 08/13/16  . Vitamin D deficiency 04/01/2016    Patient Active Problem List   Diagnosis Date Noted  . Hypertensive crisis 08/18/2020  . Hypokalemia 08/18/2020  . Prolonged QT interval 08/18/2020  . Syncope   . Chronic systolic CHF (congestive heart failure) (HCC)   . Cervical cancer screening 10/23/2019  . Systolic CHF, acute (HCC) 05/10/2019  . Vitamin D  deficiency 04/01/2016  . Nexplanon insertion 08/13/2013  . Thrombocytopenia (HCC) 04/17/2013  . 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 Underweight 01/08/2013    Past Surgical History:  Procedure Laterality Date  . RIGHT/LEFT HEART CATH AND CORONARY ANGIOGRAPHY N/A 05/14/2019   Procedure: RIGHT/LEFT HEART CATH AND CORONARY ANGIOGRAPHY;  Surgeon: Laurey Morale, MD;  Location: [facility] INVASIVE CV LAB;  Service: Cardiovascular;  Laterality: N/A;     OB History    Gravida  2   Para  2   Term  2   Preterm      AB      Living  2     SAB      IAB      Ectopic      Multiple      Live Births  2           Family History  Problem Relation Age of Onset  . Diabetes Maternal Grandmother   . Cancer Paternal Grandfather        prancreatic  . COPD Maternal Grandfather   . Cancer Maternal Grandfather        prostate  . Asthma Maternal Grandfather   . Asthma Son   . Bronchitis Son     Social History   Tobacco Use  . Smoking status: Never Smoker  . Smokeless tobacco: Never Used  Vaping Use  . Vaping Use: Never used  Substance Use Topics  . Alcohol use: Yes    Comment: occ  .  Drug use: No    Home Medications Prior to Admission medications   Medication Sig Start Date End Date Taking? Authorizing Provider  acetaminophen (TYLENOL) 325 MG tablet Take 650 mg by mouth every 6 (six) hours as needed.   Yes [provider]  amLODipine (NORVASC) 5 MG tablet Take 1 tablet (5 mg total) by mouth daily. 08/22/20 08/22/21 Yes Laurey Morale, MD  carvedilol (COREG) 25 MG tablet Take 1 tablet (25 mg total) by mouth 2 (two) times daily with a meal. 07/01/20  Yes Laurey Morale, MD  etonogestrel (NEXPLANON) 68 MG IMPL implant Inject 1 each into the skin once.   Yes [provider]  ivabradine (CORLANOR) 5 MG TABS tablet Take 0.5 tablets (2.5 mg total) by mouth 2 (two) times daily with a meal. 07/01/20  Yes Laurey Morale, MD  potassium chloride SA (KLOR-CON) 20 MEQ tablet  Take 1 tablet (20 mEq total) by mouth daily. 08/22/20  Yes Laurey Morale, MD  sacubitril-valsartan (ENTRESTO) 97-103 MG Take 1 tablet by mouth 2 (two) times daily. 08/19/20  Yes Laurey Morale, MD  sertraline (ZOLOFT) 25 MG tablet Take 1 tablet (25 mg total) by mouth daily. 08/22/20 08/22/21 Yes Laurey Morale, MD  spironolactone (ALDACTONE) 50 MG tablet TAKE (1) TABLET BY MOUTH DAILY 06/10/20  Yes Laurey Morale, MD    Allergies    Patient has no known allergies.  Review of Systems   Review of Systems Ten systems reviewed and are negative for acute change, except as noted in the HPI.   Physical Exam Updated Vital Signs BP (!) 170/108   Pulse 88   Temp 97.9 F (36.6 C) (Oral)   Resp 17   Ht 5\' 9"  (1.753 m)   Wt 54.6 kg   SpO2 98%   BMI 17.78 kg/m   Physical Exam Vitals and nursing note reviewed.  Constitutional:      General: She is not in acute distress.    Appearance: She is well-developed and well-nourished. She is not diaphoretic.  HENT:     Head: Normocephalic and atraumatic.  Eyes:     General: No scleral icterus.    Conjunctiva/sclera: Conjunctivae normal.  Cardiovascular:     Rate and Rhythm: Normal rate and regular rhythm.     Heart sounds: Normal heart sounds. No murmur heard. No friction rub. No gallop.   Pulmonary:     Effort: Pulmonary effort is normal. No respiratory distress.     Breath sounds: Normal breath sounds.  Abdominal:     General: Bowel sounds are normal. There is no distension.     Palpations: Abdomen is soft. There is no mass.     Tenderness: There is no abdominal tenderness. There is no guarding.  Musculoskeletal:     Cervical back: Normal range of motion.  Skin:    General: Skin is warm and dry.  Neurological:     Mental Status: She is alert and oriented to person, place, and time.  Psychiatric:        Mood and Affect: Mood is anxious.        Behavior: Behavior normal.     ED Results / Procedures / Treatments   Labs (all  labs ordered are listed, but only abnormal results are displayed) Labs Reviewed  BASIC METABOLIC PANEL - Abnormal; Notable for the following components:      Result Value   Potassium 3.3 (*)    Glucose, Bld 114 (*)    All  other components within normal limits  CBC WITH DIFFERENTIAL/PLATELET - Abnormal; Notable for the following components:   Hemoglobin 11.9 (*)    All other components within normal limits  URINALYSIS, ROUTINE W REFLEX MICROSCOPIC  CBG MONITORING, ED  POC URINE PREG, ED    EKG EKG Interpretation  Date/Time:  Friday August 22 2020 19:35:00 EST Ventricular Rate:  72 PR Interval:    QRS Duration: 85 QT Interval:  446 QTC Calculation: 489 R Axis:   60 Text Interpretation: Sinus rhythm Probable left ventricular hypertrophy Borderline prolonged QT interval Confirmed by Alona Bene [id number]) on 08/22/2020 8:24:24 PM   Radiology No results found.  Procedures Procedures  Medications Ordered in ED Medications  potassium chloride SA (KLOR-CON) CR tablet 40 mEq (has no administration in time range)  LORazepam (ATIVAN) tablet 1 mg (1 mg Oral Given 08/22/20 2233)    ED Course  I have reviewed the triage vital signs and the nursing notes.  Pertinent labs & imaging results that were available during my care of the patient were reviewed by me and considered in my medical decision making (see chart for details).  Clinical Course as of 08/22/20 2316  Fri Aug 22, 2020  2129 EKG shows sinus rhythm at a rate of 72 with borderline prolonged QT [AH]    Clinical Course User Index [AH] Arthor Captain, PA-C   MDM Rules/Calculators/A&P                          UE:AVWU syncope and palpitaitons VS: BP (!) 170/108   Pulse 88   Temp 97.9 F (36.6 C) (Oral)   Resp 17   Ht 5\' 9"  (1.753 m)   Wt 54.6 kg   SpO2 98%   BMI 17.78 kg/m   is gathered by patient  and emr. Previous records obtained and reviewed. DDX:The patient's complaint of palpitations involves  an extensive number of diagnostic and treatment options, and is a complaint that carries with it a high risk of complications, morbidity, and potential mortality. Given the large differential diagnosis, medical decision making is of high complexity. The differential diagnosis for palpitations includes cardiac arrhythmias, PVC/PAC, ACS, Cardiomyopathy, CHF, MVP, pericarditis, valvular disease, Panic/Anxiety, Somatic disorder, ETOH, Caffeine,  Stimulant use, medication side effect, Anemia, Hyperthyroidism, pulmonary embolism.  Labs: I ordered reviewed and interpreted labs which include CBC which shows mildly low hemoglobin of insignificant value, BMP with mild hypokalemia of insignificant value, urine pregnancy is negative  Imaging:  EKG: Sinus rhythm at a rate of 72 with borderline prolonged QT Consults: MDM: Patient here with palpitations, near syncope.  She has been worked up in the outpatient setting for the same.  No evidence of arrhythmias here.  Hypokalemia repleted orally and patient given and out with potassium containing foods.  Patient given a dose of Ativan as I think there is a significant component of it dieting about her health.  Patient has close follow-up with cardiology and appears otherwise appropriate for discharge at this time.  PERC negative Patient disposition:The patient appears reasonably screened and/or stabilized for discharge and I doubt any other medical condition or other [facility] requiring further screening, evaluation, or treatment in the ED at this time prior to discharge. I have discussed lab and/or imaging findings with the patient and answered all questions/concerns to the best of my ability.I have discussed return precautions and OP follow up.    Final Clinical Impression(s) / ED Diagnoses Final diagnoses:  Near syncope  Hypokalemia  Palpitations    Rx / DC Orders ED Discharge Orders    None       Arthor Captain, PA-C 08/22/20 2320    Maia Plan,  MD 08/25/20 1020

## 2020-08-22 NOTE — ED Triage Notes (Signed)
Pt called EMS due to continued HTN, pt states she also had a near syncopal episode. Pt admits that she had not taken her HTN medications.

## 2020-08-22 NOTE — ED Notes (Signed)
Patient states that if she gets up she is gonna pass out.  Will attempt orthostatic vitals another time

## 2020-08-22 NOTE — Patient Instructions (Signed)
START Sertraline 25 mg, one tab daily START Amlodipine 5 mg, one tab daily START Potassium 20 meq one tab daily  BE SURE TO TAKE TWICE A DAY MEDICATIONS TWICE A DAY  Your physician recommends that you schedule a follow-up appointment in: 2 weeks  in the Advanced Practitioners (PA/NP) Clinic    Do the following things EVERYDAY: 1) Weigh yourself in the morning before breakfast. Write it down and keep it in a log. 2) Take your medicines as prescribed 3) Eat low salt foods--Limit salt (sodium) to 2000 mg per day.  4) Stay as active as you can everyday 5) Limit all fluids for the day to less than 2 liters At the [facility], you and your health needs are our priority. As part of our continuing mission to provide you with exceptional heart care, we have created designated Provider Care Teams. These Care Teams include your primary Cardiologist (physician) and Advanced Practice Providers (APPs- Physician Assistants and Nurse Practitioners) who all work together to provide you with the care you need, when you need it.   You may see any of the following providers on your designated Care Team at your next follow up: Marland Kitchen Dr Arvilla Meres . Dr Marca Ancona . Dr Thornell Mule . Tonye Becket, NP . Robbie Lis, PA . Shanda Bumps [place],NP . Karle Plumber, PharmD   Please be sure to bring in all your medications bottles to every appointment.   If you have any questions or concerns before your next appointment please send [place] a message through [place] or call our office at [phone].    TO LEAVE A MESSAGE FOR THE NURSE SELECT OPTION 2, PLEASE LEAVE A MESSAGE INCLUDING: . YOUR NAME . DATE OF BIRTH . CALL BACK NUMBER . REASON FOR CALL**this is important as we prioritize the call backs  YOU WILL RECEIVE A CALL BACK THE SAME DAY AS LONG AS YOU CALL BEFORE 4:00 PM

## 2020-08-23 ENCOUNTER — Encounter (HOSPITAL_COMMUNITY): Payer: Self-pay | Admitting: Emergency Medicine

## 2020-08-23 ENCOUNTER — Emergency Department (HOSPITAL_COMMUNITY)
Admission: EM | Admit: 2020-08-23 | Discharge: 2020-08-23 | Disposition: A | Discharge status: Home / Self Care | Payer: Medicaid Other | Attending: Emergency Medicine | Admitting: Emergency Medicine

## 2020-08-23 ENCOUNTER — Other Ambulatory Visit: Payer: Self-pay

## 2020-08-23 DIAGNOSIS — Z8541 Personal history of malignant neoplasm of cervix uteri: Secondary | ICD-10-CM | POA: Insufficient documentation

## 2020-08-23 DIAGNOSIS — I5022 Chronic systolic (congestive) heart failure: Secondary | ICD-10-CM | POA: Diagnosis not present

## 2020-08-23 DIAGNOSIS — I110 Hypertensive heart disease with heart failure: Secondary | ICD-10-CM | POA: Insufficient documentation

## 2020-08-23 DIAGNOSIS — R002 Palpitations: Secondary | ICD-10-CM | POA: Diagnosis not present

## 2020-08-23 DIAGNOSIS — Z79899 Other long term (current) drug therapy: Secondary | ICD-10-CM | POA: Diagnosis not present

## 2020-08-23 DIAGNOSIS — R079 Chest pain, unspecified: Secondary | ICD-10-CM | POA: Diagnosis present

## 2020-08-23 DIAGNOSIS — F1021 Alcohol dependence, in remission: Secondary | ICD-10-CM | POA: Diagnosis not present

## 2020-08-23 DIAGNOSIS — F1011 Alcohol abuse, in remission: Secondary | ICD-10-CM

## 2020-08-23 DIAGNOSIS — R0609 Other forms of dyspnea: Secondary | ICD-10-CM | POA: Insufficient documentation

## 2020-08-23 NOTE — ED Notes (Signed)
Patient Alert and oriented to baseline. Stable and ambulatory to baseline. Patient verbalized understanding of the discharge instructions.  Patient belongings were taken by the patient.   

## 2020-08-23 NOTE — ED Provider Notes (Signed)
[facility] EMERGENCY DEPARTMENT Provider Note   CSN: [id number] Arrival date & time: 08/23/20  1440     History Chief Complaint  Patient presents with  . Chest Pain    Tina Fletcher is a 35 y.o. female with PMH of CHF followed by cardiology who has been seen in the ER 5 times in the past 6 days, including earlier today, who presents for a 6-day history of central chest pain and shortness of breath with exertion.  I reviewed patient's medical record and CT abdomen and pelvis obtained with contrast 3 hours prior to my examination was entirely unremarkable.  No acute findings.  She had a mild elevation in her lipase, but no pancreatic stranding seen on CT.  She also had plain films obtained of chest today which was without any acute cardiopulmonary disease.  During her recent prior ED encounters, including yesterday, her work-up is all been unremarkable and nonspecific.  They have all recommended outpatient follow-up with a cardiologist.  Echocardiogram obtained in March 2021 showed LVEF 30 to 35% and global hypokinesis.  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    Patient reports that her echocardiogram has already improved to 45%.  She saw her cardiologist, Dr. Shirlee Latch, yesterday.  He prescribed her sertraline for her anxiety.  She is in here accompanied by her cousin while because the cause of a lot of things that her symptoms are in the context of alcohol withdrawal.  She states that her last alcoholic beverage was 7 days ago.  She had been endorsing near syncope and tachycardia all week.  She thought that maybe perhaps she had had seizures?  Patient understands that her chest discomfort and other symptoms have all been worked up extensively.  She also states that she has not been eating and drinking well and admits that she is likely dehydrated.  She denies any difficulty swallowing, rash, witnessed seizures, other illicit drug use,  hemoptysis, history of clots or clotting disorder, recent surgeries or immobilizations, unilateral extremity swelling or edema, or other symptoms.  HPI     Past Medical History:  Diagnosis Date  . Abnormal menstrual periods 02/12/2014  . CHF (congestive heart failure) (HCC)   . Herpes simplex without mention of complication   . Hypertension   . Medical history non-contributory   . Migraines   . Nexplanon insertion 08/13/2013   08/13/13 inserted left arm, remove 08/13/16  . Vitamin D deficiency 04/01/2016    Patient Active Problem List   Diagnosis Date Noted  . Hypertensive crisis 08/18/2020  . Hypokalemia 08/18/2020  . Prolonged QT interval 08/18/2020  . Syncope   . Chronic systolic CHF (congestive heart failure) (HCC)   . Cervical cancer screening 10/23/2019  . Systolic CHF, acute (HCC) 05/10/2019  . Vitamin D deficiency 04/01/2016  . Nexplanon insertion 08/13/2013  . Thrombocytopenia (HCC) 04/17/2013  . 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 Underweight 01/08/2013    Past Surgical History:  Procedure Laterality Date  . RIGHT/LEFT HEART CATH AND CORONARY ANGIOGRAPHY N/A 05/14/2019   Procedure: RIGHT/LEFT HEART CATH AND CORONARY ANGIOGRAPHY;  Surgeon: Laurey Morale, MD;  Location: [facility] INVASIVE CV LAB;  Service: Cardiovascular;  Laterality: N/A;     OB History    Gravida  2   Para  2   Term  2   Preterm      AB      Living  2     SAB      IAB  Ectopic      Multiple      Live Births  2           Family History  Problem Relation Age of Onset  . Diabetes Maternal Grandmother   . Cancer Paternal Grandfather        prancreatic  . COPD Maternal Grandfather   . Cancer Maternal Grandfather        prostate  . Asthma Maternal Grandfather   . Asthma Son   . Bronchitis Son     Social History   Tobacco Use  . Smoking status: Never Smoker  . Smokeless tobacco: Never Used  Vaping Use  . Vaping Use: Never used  Substance Use Topics  . Alcohol use:  Yes    Comment: occ  . Drug use: No    Home Medications Prior to Admission medications   Medication Sig Start Date End Date Taking? Authorizing Provider  acetaminophen (TYLENOL) 325 MG tablet Take 650 mg by mouth every 6 (six) hours as needed.    [provider]  amLODipine (NORVASC) 5 MG tablet Take 1 tablet (5 mg total) by mouth daily. 08/22/20 08/22/21  Laurey Morale, MD  carvedilol (COREG) 25 MG tablet Take 1 tablet (25 mg total) by mouth 2 (two) times daily with a meal. 07/01/20   Laurey Morale, MD  etonogestrel (NEXPLANON) 68 MG IMPL implant Inject 1 each into the skin once.    [provider]  ivabradine (CORLANOR) 5 MG TABS tablet Take 0.5 tablets (2.5 mg total) by mouth 2 (two) times daily with a meal. 07/01/20   Laurey Morale, MD  potassium chloride SA (KLOR-CON) 20 MEQ tablet Take 1 tablet (20 mEq total) by mouth daily. 08/22/20   Laurey Morale, MD  sacubitril-valsartan (ENTRESTO) 97-103 MG Take 1 tablet by mouth 2 (two) times daily. 08/19/20   Laurey Morale, MD  sertraline (ZOLOFT) 25 MG tablet Take 1 tablet (25 mg total) by mouth daily. 08/22/20 08/22/21  Laurey Morale, MD  spironolactone (ALDACTONE) 50 MG tablet TAKE (1) TABLET BY MOUTH DAILY 06/10/20   Laurey Morale, MD    Allergies    Patient has no known allergies.  Review of Systems   Review of Systems  All other systems reviewed and are negative.   Physical Exam Updated Vital Signs BP 122/83 (BP Location: Right Arm)   Pulse 85   Temp 98.7 F (37.1 C) (Oral)   Resp 20   LMP 07/29/2020   SpO2 100%   Physical Exam Vitals and nursing note reviewed. Exam conducted with a chaperone present.  Constitutional:      General: She is not in acute distress.    Appearance: Normal appearance. She is not toxic-appearing.  HENT:     Head: Normocephalic and atraumatic.  Eyes:     General: No scleral icterus.    Conjunctiva/sclera: Conjunctivae normal.  Neck:     Comments: No  thyromegaly. Cardiovascular:     Rate and Rhythm: Normal rate and regular rhythm.     Pulses: Normal pulses.  Pulmonary:     Effort: Pulmonary effort is normal. No respiratory distress.     Breath sounds: Normal breath sounds. No wheezing or rales.  Abdominal:     General: Abdomen is flat. There is no distension.     Palpations: Abdomen is soft.     Tenderness: There is no abdominal tenderness. There is no guarding.  Musculoskeletal:     Cervical back: Normal range  of motion and neck supple.     Right lower leg: No edema.     Left lower leg: No edema.  Skin:    General: Skin is dry.     Capillary Refill: Capillary refill takes less than 2 seconds.  Neurological:     General: No focal deficit present.     Mental Status: She is alert and oriented to person, place, and time.     GCS: GCS eye subscore is 4. GCS verbal subscore is 5. GCS motor subscore is 6.  Psychiatric:        Mood and Affect: Mood normal.        Behavior: Behavior normal.        Thought Content: Thought content normal.     ED Results / Procedures / Treatments   Labs (all labs ordered are listed, but only abnormal results are displayed) Labs Reviewed - No data to display  EKG None  Radiology No results found.  Procedures Procedures   Medications Ordered in ED Medications - No data to display  ED Course  I have reviewed the triage vital signs and the nursing notes.  Pertinent labs & imaging results that were available during my care of the patient were reviewed by me and considered in my medical decision making (see chart for details).    MDM Rules/Calculators/A&P                          HEDY GARRO was evaluated in Emergency Department on 08/23/2020 for the symptoms described in the history of present illness. She was evaluated in the context of the global COVID-19 pandemic, which necessitated consideration that the patient might be at risk for infection with the SARS-CoV-2 virus that  causes COVID-19. Institutional protocols and algorithms that pertain to the evaluation of patients at risk for COVID-19 are in a state of rapid change based on information released by regulatory bodies including the CDC and federal and state organizations. These policies and algorithms were followed during the patient's care in the ED.  I personally reviewed patient's medical chart and all notes from triage and staff during today's encounter. I have also ordered and reviewed all labs and imaging that I felt to be medically necessary in the evaluation of this patient's complaints and with consideration of their physical exam. If needed, translation services were available and utilized.   Patient is here in the ED with concern for alcohol withdrawal seizures as cause for her palpitations and episodes of near syncope.  She states that her cardiologist, Dr. Shirlee Latch, adjusted her antihypertensive medications yesterday and also has planned for Zio patch to evaluate for cardiac arrhythmia.  TSH obtained earlier today at [facility] was within normal limits.  She has had extensive laboratory work-up that has all been reassuring.  No further laboratory work-up or imaging is warranted.  She is PERC negative and I have low suspicion for pulmonary embolism.  Given her 7-day chronicity since last alcoholic beverage, do not feel as though treatment with Librium or admission for observation is warranted.  She is beyond threshold for delirium tremens or alcohol withdrawal seizures.    Patient was commended on her decision to abstain from alcohol.  However, cautioned her on the risks of self-guided withdrawal/discontinuation from heavy alcohol use in the future if she relapses.  She can follow-up with her cardiologist for ongoing evaluation and management.  ED return precautions discussed.  Patient voices understanding and is  agreeable to the plan.  Final Clinical Impression(s) / ED Diagnoses Final diagnoses:  Alcohol  use disorder, mild, in early remission  Palpitations    Rx / DC Orders ED Discharge Orders    None       Lorelee New, PA-C 08/23/20 1657    Terrilee Files, MD 08/24/20 1102

## 2020-08-23 NOTE — Discharge Instructions (Addendum)
Please read the attachment on outpatient resources available for alcohol use disorder.  I commend you on your ability to you stop drinking.  Fortunately given that you are at day 7 since alcohol cessation, you are in the clear.  The particular concerning time is 24 to 72 hours since last alcohol consumption.  I anticipate that your episodes of palpitations and feeling as though you may pass out will continue to improve with time.  I also encourage you to continue to follow-up with your cardiologist for the Zio patch placement/Holter monitor, as planned.  Continue take all of your prescribed medications from your cardiologist.  I have reviewed all of your recent laboratory work-up and imaging which have been reassuring.  Given that you have not been eating and drinking well, this is also likely contributing factor.  Please make sure that you are eating and drinking regularly.  Losing too much weight too quickly can make you particularly weak, fatigued, dizzy, and feeling as though you may pass out.  Return to the ED or seek immediate medical attention should you experience any new or worsening symptoms.

## 2020-08-23 NOTE — ED Triage Notes (Signed)
Pt reports pain to center of chest since Sunday and SOB with exertion.  Seen at [facility] 2/20, 2/21, 2/23, 2/25 and [facility] today.

## 2020-08-24 ENCOUNTER — Emergency Department (HOSPITAL_COMMUNITY)
Admission: EM | Admit: 2020-08-24 | Discharge: 2020-08-24 | Disposition: A | Discharge status: Home / Self Care | Payer: Medicaid Other | Source: Home / Self Care | Attending: Emergency Medicine | Admitting: Emergency Medicine

## 2020-08-24 ENCOUNTER — Other Ambulatory Visit: Payer: Self-pay

## 2020-08-24 ENCOUNTER — Telehealth: Payer: Self-pay | Admitting: Physician Assistant

## 2020-08-24 ENCOUNTER — Encounter (HOSPITAL_COMMUNITY): Payer: Self-pay | Admitting: Emergency Medicine

## 2020-08-24 ENCOUNTER — Emergency Department (HOSPITAL_COMMUNITY)
Admission: EM | Admit: 2020-08-24 | Discharge: 2020-08-24 | Disposition: A | Discharge status: Home / Self Care | Payer: Medicaid Other | Attending: Emergency Medicine | Admitting: Emergency Medicine

## 2020-08-24 ENCOUNTER — Encounter (HOSPITAL_COMMUNITY): Payer: Self-pay

## 2020-08-24 DIAGNOSIS — R079 Chest pain, unspecified: Secondary | ICD-10-CM | POA: Insufficient documentation

## 2020-08-24 DIAGNOSIS — Z8541 Personal history of malignant neoplasm of cervix uteri: Secondary | ICD-10-CM | POA: Insufficient documentation

## 2020-08-24 DIAGNOSIS — F10932 Alcohol use, unspecified with withdrawal with perceptual disturbance: Secondary | ICD-10-CM

## 2020-08-24 DIAGNOSIS — R000 Tachycardia, unspecified: Secondary | ICD-10-CM | POA: Insufficient documentation

## 2020-08-24 DIAGNOSIS — R002 Palpitations: Secondary | ICD-10-CM

## 2020-08-24 DIAGNOSIS — I5022 Chronic systolic (congestive) heart failure: Secondary | ICD-10-CM | POA: Insufficient documentation

## 2020-08-24 DIAGNOSIS — Z5321 Procedure and treatment not carried out due to patient leaving prior to being seen by health care provider: Secondary | ICD-10-CM | POA: Insufficient documentation

## 2020-08-24 DIAGNOSIS — R630 Anorexia: Secondary | ICD-10-CM | POA: Insufficient documentation

## 2020-08-24 DIAGNOSIS — Z79899 Other long term (current) drug therapy: Secondary | ICD-10-CM | POA: Insufficient documentation

## 2020-08-24 DIAGNOSIS — R55 Syncope and collapse: Secondary | ICD-10-CM

## 2020-08-24 DIAGNOSIS — F10232 Alcohol dependence with withdrawal with perceptual disturbance: Secondary | ICD-10-CM | POA: Insufficient documentation

## 2020-08-24 DIAGNOSIS — I110 Hypertensive heart disease with heart failure: Secondary | ICD-10-CM | POA: Insufficient documentation

## 2020-08-24 LAB — BASIC METABOLIC PANEL
Anion gap: 11 (ref 5–15)
BUN: 7 mg/dL (ref 6–20)
CO2: 22 mmol/L (ref 22–32)
Calcium: 9.4 mg/dL (ref 8.9–10.3)
Chloride: 102 mmol/L (ref 98–111)
Creatinine, Ser: 0.79 mg/dL (ref 0.44–1.00)
GFR, Estimated: >60 mL/min (ref >60)
Glucose, Bld: 93 mg/dL (ref 70–99)
Potassium: 3.5 mmol/L (ref 3.5–5.1)
Sodium: 135 mmol/L (ref 135–145)

## 2020-08-24 LAB — MAGNESIUM: Magnesium: 2 mg/dL (ref 1.7–2.4)

## 2020-08-24 MED ORDER — CHLORDIAZEPOXIDE HCL 25 MG PO CAPS: ORAL_CAPSULE | ORAL | 0 refills | Status: DC

## 2020-08-24 MED ORDER — LORAZEPAM 0.5 MG PO TABS
0.5000 mg | ORAL_TABLET | Freq: Once | ORAL | Status: AC
  Administered 2020-08-24: 0.5 mg via ORAL
  Filled 2020-08-24: qty 1

## 2020-08-24 MED ORDER — POTASSIUM CHLORIDE CRYS ER 20 MEQ PO TBCR
40.0000 meq | EXTENDED_RELEASE_TABLET | Freq: Once | ORAL | Status: AC
  Administered 2020-08-24: 40 meq via ORAL
  Filled 2020-08-24: qty 2

## 2020-08-24 NOTE — Progress Notes (Signed)
Advanced Heart Failure Clinic Note   Referring Physician: PCP: System, Provider Not In PCP-Cardiologist: Dr. Shirlee Latch    HPI: 35 y.o. female with past history of HTN presented to [facility] on 05/10/19 with cough, SOB, LE swelling, orthopnea, bendopnea and 20lb weight gain over 3 months. She had a history of increased alcohol use over the several months prior with 7-8 airplane bottles of liquor 3-4 days per week. BNP was elevated and CTA showed pulmonary edema. Echo showed new onset systolic HF with EF 15% and decreased RV systolic function. Cardiology was consulted and she was transferred to [facility].   While admitted she was diuresed with IV lasix. Right and Left heart cath were done. LHC showed no significant coronary artery disease and RHC was within normal limits. Her cardiomyopathy is possibly secondary to recent increased alcohol use or uncontrolled HTN. Viral myocarditis was felt to be a possible etiology with recent cough, however cMRI showed no myocardial LGE, so no definitive evidence for prior MI, infiltrative disease, or myocarditis.  Also of note, she has no family history of cardiomyopathy, negative HIV, and TSH was normal.   Echo in 3/21 showed EF 30-35%, global hypokinesis, mild RV dilation with normal function, mild MR.   On 06/06/20, she had a CBD gummy with a high dosage of CBD in it.  Shortly afterwards, she became lightheaded and dizzy and actually passed out.  She had been out of her meds for several days and her BP was elevated when she went to the ER.  HS-TnI and D dimer were normal. She was discharged home.   Echo in 1/22 showed EF up to 40-45%. She would a Zio patch in 1/22 with no significant events but only kept it on for about 4 days.   In 2/22, she was admitted with hypertensive urgency with syncope.  BP 218/110.  She had not been taking her evening BP med doses.  BP was controlled and she was discharged.  Later in 2/22, she went to the ER with chest pain.  Troponin was  negative but BP was high.  She was sent home.   She returns today for followup of CHF.  She is now drinking ETOH only very rarely.  She is on contraception.   BP is elevated today but better.  She reports extreme anxiety and thinks that this drives her blood pressure spikes.  She gets chest pain with anxiety.  No exertional dyspnea at baseline, gets short of breath when anxious.  No further lightheadedness or syncope.   ECG (personally reviewed): NSR, QTc mildly prolonged  Labs (11/20): K 3.8, creatinine 1.1 Labs (2/21): K 3.6, creatinine 0.89 Labs (12/21): BNP 145, K 3.7, creatinine 0.67, hgb 12.2 Labs (2/22): K 3.4, creatinine 0.79, hgb 11.5  PMH: 1. HTN - Renal artery dopplers (3/21): No evidence for renal artery stenosis.  2. ETOH abuse: Has quit.  3. Chronic systolic CHF: Nonischemic cardiomyopathy.  - LHC/RHC (11/20): No significant CAD; mean RA 1, PA 24/8, mean PCWP 5, CI 3.54.  - Echo (11/20): EF 15%, diffuse hypokinesis, mildly decreased RV systolic function.  - CMRI (11/20): LV mildly dilated with EF 16%, normal RV size with EF 28%, no myocardial LGE.  - Echo (3/21): EF 30-35%, global hypokinesis, mild RV dilation with normal function, mild MR.  - Echo (1/22): EF 40-45%, normal RV.  4. COVID-19 infection 9/21 5. Prolonged QT interval  Review of Systems: All systems reviewed and negative except as per HPI.    Current  Outpatient Medications  Medication Sig Dispense Refill  . acetaminophen (TYLENOL) 325 MG tablet Take 650 mg by mouth every 6 (six) hours as needed.    Marland Kitchen amLODipine (NORVASC) 5 MG tablet Take 1 tablet (5 mg total) by mouth daily. 30 tablet 11  . carvedilol (COREG) 25 MG tablet Take 1 tablet (25 mg total) by mouth 2 (two) times daily with a meal. 180 tablet 3  . etonogestrel (NEXPLANON) 68 MG IMPL implant Inject 1 each into the skin once.    . ivabradine (CORLANOR) 5 MG TABS tablet Take 0.5 tablets (2.5 mg total) by mouth 2 (two) times daily with a meal.  90 tablet 3  . potassium chloride SA (KLOR-CON) 20 MEQ tablet Take 1 tablet (20 mEq total) by mouth daily. 90 tablet 3  . sacubitril-valsartan (ENTRESTO) 97-103 MG Take 1 tablet by mouth 2 (two) times daily. 60 tablet 3  . sertraline (ZOLOFT) 25 MG tablet Take 1 tablet (25 mg total) by mouth daily. 30 tablet 2  . spironolactone (ALDACTONE) 50 MG tablet TAKE (1) TABLET BY MOUTH DAILY 30 tablet 3   No current facility-administered medications for this encounter.    No Known Allergies    Social History   Socioeconomic History  . Marital status: Single    Spouse name: Not on file  . Number of children: Not on file  . Years of education: Not on file  . Highest education level: Not on file  Occupational History  . Not on file  Tobacco Use  . Smoking status: Never Smoker  . Smokeless tobacco: Never Used  Vaping Use  . Vaping Use: Never used  Substance and Sexual Activity  . Alcohol use: Yes    Comment: occ  . Drug use: No  . Sexual activity: Yes    Birth control/protection: Implant  Other Topics Concern  . Not on file  Social History Narrative  . Not on file   Social Determinants of Health   Financial Resource Strain: Low Risk   . Difficulty of Paying Living Expenses: Not hard at all  Food Insecurity: No Food Insecurity  . Worried About Programme researcher, broadcasting/film/video in the Last Year: Never true  . Ran Out of Food in the Last Year: Never true  Transportation Needs: No Transportation Needs  . Lack of Transportation (Medical): No  . Lack of Transportation (Non-Medical): No  Physical Activity: Insufficiently Active  . Days of Exercise per Week: 1 day  . Minutes of Exercise per Session: 60 min  Stress: No Stress Concern Present  . Feeling of Stress : Not at all  Social Connections: Moderately Integrated  . Frequency of Communication with Friends and Family: More than three times a week  . Frequency of Social Gatherings with Friends and Family: More than three times a week  . Attends  Religious Services: More than 4 times per year  . Active Member of Clubs or Organizations: No  . Attends Banker Meetings: Never  . Marital Status: Living with partner  Intimate Partner Violence: Not At Risk  . Fear of Current or Ex-Partner: No  . Emotionally Abused: No  . Physically Abused: No  . Sexually Abused: No      Family History  Problem Relation Age of Onset  . Diabetes Maternal Grandmother   . Cancer Paternal Grandfather        prancreatic  . COPD Maternal Grandfather   . Cancer Maternal Grandfather        prostate  .  Asthma Maternal Grandfather   . Asthma Son   . Bronchitis Son     Vitals:   08/22/20 0952  BP: (!) 130/96  Pulse: 82  SpO2: 99%  Weight: 54.6 kg (120 lb 6.4 oz)   PHYSICAL EXAM: General: NAD Neck: No JVD, no thyromegaly or thyroid nodule.  Lungs: Clear to auscultation bilaterally with normal respiratory effort. CV: Nondisplaced PMI.  Heart regular S1/S2, no S3/S4, no murmur.  No peripheral edema.  No carotid bruit.  Normal pedal pulses.  Abdomen: Soft, nontender, no hepatosplenomegaly, no distention.  Skin: Intact without lesions or rashes.  Neurologic: Alert and oriented x 3.  Psych: Normal affect. Extremities: No clubbing or cyanosis.  HEENT: Normal.   ASSESSMENT & PLAN:  1. Chronic systolic CHF: Cardiomyopathy of uncertain etiology. No FH cardiomyopathy, negative HIV and normal TSH, no recent pregnancy. She had been drinking heavily for several months prior to diagnosis, ETOH may contribute to her cardiomyopathy. Uncontrolled HTN likely plays a role. LHC 04/2019 w/ no CAD, filling pressures optimized and preserved cardiac output. Cardiac MRI showed no myocardial LGE, so no definitive evidence for prior MI, infiltrative disease, or myocarditis. Echo in 3/21 showed EF 30-35%, echo in 1/22 with EF up to 40-45%, normal RV.  NYHA class II symptoms generally.  Not volume overloaded.  She has quit ETOH. BP remains high but better today  than when she visited the ER earlier this month.  - Stay off ETOH.  - Continue Entresto 97/103 bid.  - Continue spironolactone 50 mg daily.   - Continue Coreg 25 mg bid.   - Continue Corlanor 2.5 mg bid.  - Unable to tolerate Bidil due to headache.  - Add KCl 20 daily with mildly low K and slightly prolonged QT interval.  - We discussed importance of avoiding pregnancy given risk of fetotoxicity w/ HF meds. She reports she is compliant w/ OCPs.  - Narrow QRS, not CRT candidate. She is out of ICD range with EF 40-45% on last echo.   2. HTN: BP remains high but now better controlled. No evidence for fibromuscular dysplasia on renal artery dopplers.  - Add amlodipine 5 mg daily, can titrate up as needed.  - Anxiety appears to be playing a major role.  3. ETOH abuse: She has quit ETOH. 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  - With mildly decreased K, adding KCl 20 daily.  5. Syncope: Uncertain cause, may be related to high dose of CBD in gummy initially.  Had a second episode in 2/22 in setting of markedly high BP. Zio patch in 1/22 was only worn for 4 days.  - Repeat Zio patch x 14 days.  If negative, may need EP referral for ILR.   6. Anxiety: Marked generalized anxiety.   - Start sertraline 25 mg daily, should not have much affect on QTc.   F/u 2 wks with APP.   Strongly encouraged compliance with medical regimen.   Marca Ancona, MD 08/24/20

## 2020-08-24 NOTE — ED Triage Notes (Signed)
Pt c/o intermittent sharp, stabbing pains, along with syncopal episodes. Pt has been seen multiple times this week for same.

## 2020-08-24 NOTE — ED Provider Notes (Signed)
[facility]-EMERGENCY DEPT Provider Note   CSN: [id number] Arrival date & time: 08/24/20  1842   Chief Complaint: Near-syncope  History Chief Complaint  Patient presents with  . Loss of Consciousness    Tina Fletcher is a 35 y.o. female.  HPI   Tina Fletcher is a 35 y.o. underweight female w/ PMHx HFrEF, prolonged QTc, hypokalemia, HTN and thrombocytopenia, presenting with intermittent episodes of syncope and near-syncope. She states she lost consciousness last Sunday. Her SBP at the time was 218. She went to the ED and was found to have mild hypokalemia which was replaced and blood pressures improved on a gtt. She has since been seen many times in the ED since for recurrent near-syncopal events. Prior to these events, she develops minutes of non-pleuritic chest discomfort with palpitations, feels as if her heart skips beats. She states her legs will shake for a couple of seconds and she will get very anxious. She then feels as though she'll pass out. Wednesday, she did lose consciousness again. She has decreased appetite and has lost 17 lbs since last week. She drank 2 Ensures today. Denies body dysmorphia, nausea, vomiting, fevers, chills, abdominal pain, or any other symptoms.   Past Medical History:  Diagnosis Date  . Abnormal menstrual periods 02/12/2014  . CHF (congestive heart failure) (HCC)   . Herpes simplex without mention of complication   . Hypertension   . Medical history non-contributory   . Migraines   . Nexplanon insertion 08/13/2013   08/13/13 inserted left arm, remove 08/13/16  . Vitamin D deficiency 04/01/2016    Patient Active Problem List   Diagnosis Date Noted  . Hypertensive crisis 08/18/2020  . Hypokalemia 08/18/2020  . Prolonged QT interval 08/18/2020  . Syncope   . Chronic systolic CHF (congestive heart failure) (HCC)   . Cervical cancer screening 10/23/2019  . Systolic CHF, acute (HCC) 05/10/2019  . Vitamin D deficiency  04/01/2016  . Nexplanon insertion 08/13/2013  . Thrombocytopenia (HCC) 04/17/2013  . 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 Underweight 01/08/2013    Past Surgical History:  Procedure Laterality Date  . RIGHT/LEFT HEART CATH AND CORONARY ANGIOGRAPHY N/A 05/14/2019   Procedure: RIGHT/LEFT HEART CATH AND CORONARY ANGIOGRAPHY;  Surgeon: Laurey Morale, MD;  Location: [facility] INVASIVE CV LAB;  Service: Cardiovascular;  Laterality: N/A;     OB History    Gravida  2   Para  2   Term  2   Preterm      AB      Living  2     SAB      IAB      Ectopic      Multiple      Live Births  2           Family History  Problem Relation Age of Onset  . Diabetes Maternal Grandmother   . Cancer Paternal Grandfather        prancreatic  . COPD Maternal Grandfather   . Cancer Maternal Grandfather        prostate  . Asthma Maternal Grandfather   . Asthma Son   . Bronchitis Son     Social History   Tobacco Use  . Smoking status: Never Smoker  . Smokeless tobacco: Never Used  Vaping Use  . Vaping Use: Never used  Substance Use Topics  . Alcohol use: Yes    Comment: occ  . Drug use: No    Home Medications Prior to Admission medications  Medication Sig Start Date End Date Taking? Authorizing Provider  acetaminophen (TYLENOL) 325 MG tablet Take 650 mg by mouth every 6 (six) hours as needed for moderate pain.   Yes [provider]  amLODipine (NORVASC) 5 MG tablet Take 1 tablet (5 mg total) by mouth daily. 08/22/20 08/22/21 Yes Laurey Morale, MD  carvedilol (COREG) 12.5 MG tablet Take 18.75 mg by mouth 2 (two) times daily with a meal.   Yes [provider]  chlordiazePOXIDE (LIBRIUM) 25 MG capsule Day 1, please take 2 tablets for breakfast, lunch, and dinner. Day 2, please take 1-2 tablets for breakfast and dinner. Day 3 take 1-2 tablets for breakfast only. Take the minimum dose required to alleviate symptoms. 08/24/20  Yes Glenford Bayley, MD  etonogestrel  (NEXPLANON) 68 MG IMPL implant Inject 1 each into the skin continuous.   Yes [provider]  ivabradine (CORLANOR) 5 MG TABS tablet Take 0.5 tablets (2.5 mg total) by mouth 2 (two) times daily with a meal. 07/01/20  Yes Laurey Morale, MD  meclizine (ANTIVERT) 25 MG tablet Take 25 mg by mouth 3 (three) times daily as needed for dizziness. 08/23/20 09/22/20 Yes [provider]  potassium chloride SA (KLOR-CON) 20 MEQ tablet Take 1 tablet (20 mEq total) by mouth daily. 08/22/20  Yes Laurey Morale, MD  sacubitril-valsartan (ENTRESTO) 97-103 MG Take 1 tablet by mouth 2 (two) times daily. 08/19/20  Yes Laurey Morale, MD  sertraline (ZOLOFT) 25 MG tablet Take 1 tablet (25 mg total) by mouth daily. 08/22/20 08/22/21 Yes Laurey Morale, MD  spironolactone (ALDACTONE) 50 MG tablet TAKE (1) TABLET BY MOUTH DAILY Patient taking differently: Take 50 mg by mouth daily. 06/10/20  Yes Laurey Morale, MD  carvedilol (COREG) 25 MG tablet Take 1 tablet (25 mg total) by mouth 2 (two) times daily with a meal. Patient not taking: Reported on 08/24/2020 07/01/20   Laurey Morale, MD    Allergies    Patient has no known allergies.  Review of Systems   Review of Systems   10-point review of systems otherwise negative except as noted above in HPI.   Physical Exam Updated Vital Signs BP (!) 136/91   Pulse 79   Temp 98.2 F (36.8 C) (Oral)   Resp 15   LMP 07/29/2020   SpO2 100%   Physical Exam   General: Patient appears thin and tired, but otherwise well. No acute distress. Eyes: Sclera non-icteric. No conjunctival injection.  HENT: Neck is supple. No nasal discharge. MMM.  Respiratory: Lungs are CTA, bilaterally. No wheezes, rales, or rhonchi.  Cardiovascular: Regular rate and rhythm. There are 2/6 murmurs heard loudest along the left upper and lower sternal borders. No lower extremity edema. Extremities are warm and well-perfused. Neurological: Alert and oriented x 3. CN II-XII  intact.  Musculoskeletal: Normal muscle bulk and tone. Strength is 5/5 in all four extremities.  Abdominal: Soft and non-tender to palpation. Bowel sounds intact. No rebound or guarding. Skin: No lesions. No rashes.  Psych: Normal affect. Normal tone of voice.   ED Results / Procedures / Treatments   Labs (all labs ordered are listed, but only abnormal results are displayed) Labs Reviewed  MAGNESIUM    EKG None   NSR at 80 bpm. No consecutive T wave inversions or ST segment changes to suggest acute ischemia. There is LVH.   Radiology No results found.  Procedures Procedures   Medications Ordered in ED Medications  LORazepam (ATIVAN) tablet  0.5 mg (0.5 mg Oral Given 08/24/20 2055)  potassium chloride SA (KLOR-CON) CR tablet 40 mEq (40 mEq Oral Given 08/24/20 2055)    ED Course  I have reviewed the triage vital signs and the nursing notes.  Pertinent labs & imaging results that were available during my care of the patient were reviewed by me and considered in my medical decision making (see chart for details).    MDM Rules/Calculators/A&P                          Ms. Claar is a 35 y.o. lady w/ PMHx HTN, HFrEF, prolonged QTc, hypokalemia, and thrombocytopenia with history of poor medication compliance, presenting with multiple episodes of near-syncope and LOC x 2 over the past 7 days. She describes associated palpitations, chest discomfort, and and LE shaking. Patient endorses severe anxiety associated with episodes, fearing she may fall. Differential includes alcohol withdrawal, neurogenic syncope, underlying arrhythmia / electrolyte disturbance, ACS, seizures. She just had negative troponin and d-dimer earlier today making ACS and PE very unlikely especially in the setting of stable vitals. Recent TSH testing has been normal. She has had recent hypokalemia which could explain arrhythmia, likely exacerbated by recent decreased PO intake.   Plan:   - Check EKG, BMP - Will  give ativan 0.5mg  IV x 1 for alcohol withdrawal.   Results:   Potassium borderline at 3.5, Mg 2.0, otherwise, unremarkable labs with unremarkable EKG. Patient's symptoms resolved with ativan in the hospital, making alcohol withdrawal likely cause of her symptoms.   Patient is stable for discharge home with PCP follow up. Will prescribe 3 day librium taper and return precautions including persistent palpitations with near-syncope despite above.   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

## 2020-08-24 NOTE — ED Notes (Signed)
Pt wanted to wait in between ED doors wheelchair area. With family.

## 2020-08-24 NOTE — ED Triage Notes (Signed)
Pt states she left [facility] and came straight here. Pt states she was diagnosed with heart failure in November 2020. Pt c/o syncopal episodes x1 week. Pt states there have been too many episodes to count. Pt states she has been taking prescribed meds as ordered. Pt states she may have hit her head sometime in the last week. Pt denies taking recreational drugs.

## 2020-08-24 NOTE — ED Notes (Addendum)
Pt asked me how long is the wait I told her who has been waiting the longest and pt and and family started going to the door. I asked if they where leaving pt stated that she is going to [facility]. Pt family member wheeled her out of lobby.

## 2020-08-24 NOTE — ED Notes (Signed)
Pt ambulated to the restroom independently without issues.  

## 2020-08-24 NOTE — Telephone Encounter (Signed)
Patient called after hours.  Multiple ER visits recently.  Also seen by Dr. Shirlee Latch.  Another episode of suddenly feeling dizziness with near syncope.  She felt her heart rate was racing.  Symptoms lasted for 2 to 3 minutes.  Advised to monitor symptoms.  If persistent palpitation call EMS to get EKG.  Waiting Zio patch placement.

## 2020-08-24 NOTE — Discharge Instructions (Signed)
Ms. Fikes,   The palpitations, anxiety, and near-syncope that you have been experiencing are likely due to alcohol withdrawal, especially given your improvement in the hospital with Ativan.   I have prescribed a Librium taper to the [facility] on [address]. Please take as prescribed on the bottle - 2 tablets three times the first day, 1-2 tablets twice daily the second day, and 1-2 tablets once the third day. Only take the minimum dose required to keep your symptoms at bay.   Please return to the ED if you experience continued syncopal episodes where you lose consciousness despite finishing the above regimen. Great work in stopping your alcohol consumption!  Please follow up with your PCP within the next 2 weeks for further assessment.   Dr. Laddie Aquas

## 2020-08-24 NOTE — ED Notes (Signed)
EKG and lab orders given per Dr. Hyacinth Meeker.

## 2020-08-25 ENCOUNTER — Other Ambulatory Visit: Payer: Self-pay

## 2020-08-25 ENCOUNTER — Encounter (HOSPITAL_COMMUNITY): Payer: Self-pay | Admitting: Emergency Medicine

## 2020-08-25 ENCOUNTER — Emergency Department (HOSPITAL_COMMUNITY)
Admission: EM | Admit: 2020-08-25 | Discharge: 2020-08-25 | Disposition: A | Discharge status: Home / Self Care | Payer: Medicaid Other | Attending: Emergency Medicine | Admitting: Emergency Medicine

## 2020-08-25 DIAGNOSIS — R55 Syncope and collapse: Secondary | ICD-10-CM | POA: Insufficient documentation

## 2020-08-25 DIAGNOSIS — R030 Elevated blood-pressure reading, without diagnosis of hypertension: Secondary | ICD-10-CM | POA: Diagnosis present

## 2020-08-25 DIAGNOSIS — I5023 Acute on chronic systolic (congestive) heart failure: Secondary | ICD-10-CM | POA: Insufficient documentation

## 2020-08-25 DIAGNOSIS — I110 Hypertensive heart disease with heart failure: Secondary | ICD-10-CM | POA: Diagnosis not present

## 2020-08-25 DIAGNOSIS — Z79899 Other long term (current) drug therapy: Secondary | ICD-10-CM | POA: Insufficient documentation

## 2020-08-25 DIAGNOSIS — R002 Palpitations: Secondary | ICD-10-CM | POA: Insufficient documentation

## 2020-08-25 DIAGNOSIS — I10 Essential (primary) hypertension: Secondary | ICD-10-CM

## 2020-08-25 NOTE — ED Provider Notes (Signed)
[facility] EMERGENCY DEPARTMENT Provider Note   CSN: [id number] Arrival date & time: 08/25/20  0409     History Chief Complaint  Patient presents with  . Hypertension    Tina Fletcher is a 35 y.o. female.  Patient has history of CHF followed by Dr. Sherlie Ban.  Patient's history of present illness is kind of hard to follow.  She quit drinking alcohol about 10 days ago.  She states that it appears on the records that about 8 days ago she came in with episode of syncope and elevated blood pressure.  She is discharged the next day.  She has been seen 7 more times since then and today is her ninth visit in the last 8 days.  This is often related to syncope, near syncope or hypertensive episodes.  She states that she will have episodes of palpitations, worry and they feel she did not pass out after breathing fast.  What happened today was that she woke up and checked her blood pressure because she was worried about it.  It was high so then she laid on the bed of flushing a pass out so she took an extra amlodipine and her blood pressure got better but then she started crying so she presents here for further evaluation.  Appears that Dr. Shirlee Latch had an appointment with her a few days ago and added the amlodipine along with Zoloft.  Patient without chest pain, bowel pain or other associated symptoms at this time.   Hypertension       Past Medical History:  Diagnosis Date  . Abnormal menstrual periods 02/12/2014  . CHF (congestive heart failure) (HCC)   . Herpes simplex without mention of complication   . Hypertension   . Medical history non-contributory   . Migraines   . Nexplanon insertion 08/13/2013   08/13/13 inserted left arm, remove 08/13/16  . Vitamin D deficiency 04/01/2016    Patient Active Problem List   Diagnosis Date Noted  . Hypertensive crisis 08/18/2020  . Hypokalemia 08/18/2020  . Prolonged QT interval 08/18/2020  . Syncope   . Chronic systolic CHF (congestive heart  failure) (HCC)   . Cervical cancer screening 10/23/2019  . Systolic CHF, acute (HCC) 05/10/2019  . Vitamin D deficiency 04/01/2016  . Nexplanon insertion 08/13/2013  . Thrombocytopenia (HCC) 04/17/2013  . 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 Underweight 01/08/2013    Past Surgical History:  Procedure Laterality Date  . RIGHT/LEFT HEART CATH AND CORONARY ANGIOGRAPHY N/A 05/14/2019   Procedure: RIGHT/LEFT HEART CATH AND CORONARY ANGIOGRAPHY;  Surgeon: Laurey Morale, MD;  Location: [facility] INVASIVE CV LAB;  Service: Cardiovascular;  Laterality: N/A;     OB History    Gravida  2   Para  2   Term  2   Preterm      AB      Living  2     SAB      IAB      Ectopic      Multiple      Live Births  2           Family History  Problem Relation Age of Onset  . Diabetes Maternal Grandmother   . Cancer Paternal Grandfather        prancreatic  . COPD Maternal Grandfather   . Cancer Maternal Grandfather        prostate  . Asthma Maternal Grandfather   . Asthma Son   . Bronchitis Son     Social  History   Tobacco Use  . Smoking status: Never Smoker  . Smokeless tobacco: Never Used  Vaping Use  . Vaping Use: Never used  Substance Use Topics  . Alcohol use: Yes    Comment: occ  . Drug use: No    Home Medications Prior to Admission medications   Medication Sig Start Date End Date Taking? Authorizing Provider  amLODipine (NORVASC) 5 MG tablet Take 1 tablet (5 mg total) by mouth daily. 08/22/20 08/22/21 Yes Laurey Morale, MD  acetaminophen (TYLENOL) 325 MG tablet Take 650 mg by mouth every 6 (six) hours as needed for moderate pain.    [provider]  carvedilol (COREG) 12.5 MG tablet Take 18.75 mg by mouth 2 (two) times daily with a meal.    [provider]  carvedilol (COREG) 25 MG tablet Take 1 tablet (25 mg total) by mouth 2 (two) times daily with a meal. Patient not taking: Reported on 08/24/2020 07/01/20   Laurey Morale, MD  chlordiazePOXIDE  (LIBRIUM) 25 MG capsule Day 1, please take 2 tablets for breakfast, lunch, and dinner. Day 2, please take 1-2 tablets for breakfast and dinner. Day 3 take 1-2 tablets for breakfast only. Take the minimum dose required to alleviate symptoms. 08/24/20   Gerhard Munch, MD  etonogestrel (NEXPLANON) 68 MG IMPL implant Inject 1 each into the skin continuous.    [provider]  ivabradine (CORLANOR) 5 MG TABS tablet Take 0.5 tablets (2.5 mg total) by mouth 2 (two) times daily with a meal. 07/01/20   Laurey Morale, MD  meclizine (ANTIVERT) 25 MG tablet Take 25 mg by mouth 3 (three) times daily as needed for dizziness. 08/23/20 09/22/20  [provider]  potassium chloride SA (KLOR-CON) 20 MEQ tablet Take 1 tablet (20 mEq total) by mouth daily. 08/22/20   Laurey Morale, MD  sacubitril-valsartan (ENTRESTO) 97-103 MG Take 1 tablet by mouth 2 (two) times daily. 08/19/20   Laurey Morale, MD  sertraline (ZOLOFT) 25 MG tablet Take 1 tablet (25 mg total) by mouth daily. 08/22/20 08/22/21  Laurey Morale, MD  spironolactone (ALDACTONE) 50 MG tablet TAKE (1) TABLET BY MOUTH DAILY Patient taking differently: Take 50 mg by mouth daily. 06/10/20   Laurey Morale, MD    Allergies    Patient has no known allergies.  Review of Systems   Review of Systems  All other systems reviewed and are negative.   Physical Exam Updated Vital Signs BP 114/87   Pulse 78   Temp 98.3 F (36.8 C) (Oral)   Resp 18   Ht 5\' 9"  (1.753 m)   Wt 54.6 kg   LMP 07/29/2020   SpO2 100%   BMI 17.78 kg/m   Physical Exam Vitals and nursing note reviewed.  Constitutional:      Appearance: She is well-developed and well-nourished.  HENT:     Head: Normocephalic and atraumatic.     Nose: No congestion or rhinorrhea.     Mouth/Throat:     Mouth: Mucous membranes are moist.     Pharynx: Oropharynx is clear.  Eyes:     Pupils: Pupils are equal, round, and reactive to light.  Cardiovascular:     Rate and  Rhythm: Normal rate and regular rhythm.  Pulmonary:     Effort: No respiratory distress.     Breath sounds: No stridor.  Abdominal:     General: Abdomen is flat. There is no distension.  Musculoskeletal:  General: No swelling or tenderness. Normal range of motion.     Cervical back: Normal range of motion.  Skin:    General: Skin is warm and dry.     Coloration: Skin is not jaundiced or pale.  Neurological:     General: No focal deficit present.     Mental Status: She is alert.     ED Results / Procedures / Treatments   Labs (all labs ordered are listed, but only abnormal results are displayed) Labs Reviewed - No data to display  EKG None  Radiology No results found.  Procedures Procedures   Medications Ordered in ED Medications - No data to display  ED Course  I have reviewed the triage vital signs and the nursing notes.  Pertinent labs & imaging results that were available during my care of the patient were reviewed by me and considered in my medical decision making (see chart for details).    MDM Rules/Calculators/A&P                          Patient overall appears well.  Suspect this is panic attacks secondary to disinhibition after quit drinking alcohol.  I do think she is really in alcohol withdrawal per se but is just having increased amplitude of her mood swings secondary to not being inhibited anymore.  I advised on how to use the Librium appropriately and to follow-up with her cardiologist for further management of her symptoms.  Patient thanked me, stated she would find a therapist as well and was discharged in stable condition.  Final Clinical Impression(s) / ED Diagnoses Final diagnoses:  Hypertension, unspecified type  Palpitations    Rx / DC Orders ED Discharge Orders    None       Draper Gallon, Barbara Cower, MD 08/25/20 [phone]

## 2020-08-25 NOTE — ED Notes (Signed)
Pt reports taking her 5mg  Amlodipine PTA @ 0330. Pts BP upon initial assessment 127/90.

## 2020-08-25 NOTE — ED Triage Notes (Signed)
Pt seen at multiple locations yesterday for hypertension and syncope. Pt states she woke up tonight and felt like her BP was elevated and felt like she may pass out.

## 2020-08-26 ENCOUNTER — Ambulatory Visit (HOSPITAL_COMMUNITY)
Admission: RE | Admit: 2020-08-26 | Discharge: 2020-08-26 | Disposition: A | Discharge status: Home / Self Care | Payer: Medicaid Other | Source: Ambulatory Visit | Attending: Family Medicine | Admitting: Family Medicine

## 2020-08-26 ENCOUNTER — Encounter (HOSPITAL_COMMUNITY): Payer: Self-pay

## 2020-08-26 ENCOUNTER — Telehealth (HOSPITAL_COMMUNITY): Payer: Self-pay | Admitting: Cardiology

## 2020-08-26 VITALS — BP 120/88 | HR 104 | Wt 117.8 lb

## 2020-08-26 DIAGNOSIS — I5022 Chronic systolic (congestive) heart failure: Secondary | ICD-10-CM

## 2020-08-26 DIAGNOSIS — F1011 Alcohol abuse, in remission: Secondary | ICD-10-CM | POA: Insufficient documentation

## 2020-08-26 DIAGNOSIS — I429 Cardiomyopathy, unspecified: Secondary | ICD-10-CM | POA: Insufficient documentation

## 2020-08-26 DIAGNOSIS — F411 Generalized anxiety disorder: Secondary | ICD-10-CM | POA: Diagnosis not present

## 2020-08-26 DIAGNOSIS — I110 Hypertensive heart disease with heart failure: Secondary | ICD-10-CM | POA: Insufficient documentation

## 2020-08-26 DIAGNOSIS — Z8616 Personal history of COVID-19: Secondary | ICD-10-CM | POA: Diagnosis not present

## 2020-08-26 DIAGNOSIS — Z79899 Other long term (current) drug therapy: Secondary | ICD-10-CM | POA: Insufficient documentation

## 2020-08-26 MED ORDER — AMLODIPINE BESYLATE 5 MG PO TABS: 5.0000 mg | ORAL_TABLET | Freq: Every day | ORAL | 11 refills | Status: DC

## 2020-08-26 MED ORDER — CARVEDILOL 3.125 MG PO TABS: 3.1250 mg | ORAL_TABLET | Freq: Two times a day (BID) | ORAL | 6 refills | Status: DC

## 2020-08-26 NOTE — Progress Notes (Signed)
Advanced Heart Failure Clinic Note   Referring Physician: PCP: Laurey Morale, MD PCP-Cardiologist: Dr. Shirlee Latch    HPI: 35 y.o. female with past history of HTN presented to [facility] on 05/10/19 with cough, SOB, LE swelling, orthopnea, bendopnea and 20lb weight gain over 3 months. She had a history of increased alcohol use over the several months prior with 7-8 airplane bottles of liquor 3-4 days per week. BNP was elevated and CTA showed pulmonary edema. Echo showed new onset systolic HF with EF 15% and decreased RV systolic function. Cardiology was consulted and she was transferred to [facility].   While admitted she was diuresed with IV lasix. Right and Left heart cath were done. LHC showed no significant coronary artery disease and RHC was within normal limits. Her cardiomyopathy is possibly secondary to recent increased alcohol use or uncontrolled HTN. Viral myocarditis was felt to be a possible etiology with recent cough, however cMRI showed no myocardial LGE, so no definitive evidence for prior MI, infiltrative disease, or myocarditis.  Also of note, she has no family history of cardiomyopathy, negative HIV, and TSH was normal.   Echo in 3/21 showed EF 30-35%, global hypokinesis, mild RV dilation with normal function, mild MR.   On 06/06/20, she had a CBD gummy with a high dosage of CBD in it.  Shortly afterwards, she became lightheaded and dizzy and actually passed out.  She had been out of her meds for several days and her BP was elevated when she went to the ER.  HS-TnI and D dimer were normal. She was discharged home.   Echo in 1/22 showed EF up to 40-45%. She wore a Zio patch in 1/22 with no significant events but only kept it on for about 4 days.   In 2/22, she was admitted with hypertensive urgency with syncope.  BP 218/110.  She had not been taking her evening BP med doses.  BP was controlled and she was discharged.  Later in 2/22, she went to the ER with chest pain.  Troponin was negative  but BP was high.  She was sent home.   She returns today for followup of CHF.  She is now drinking ETOH only very rarely.  She is on contraception.   BP is elevated today but better.  She reports extreme anxiety and thinks that this drives her blood pressure spikes.  She gets chest pain with anxiety.  No exertional dyspnea at baseline, gets short of breath when anxious.  No further lightheadedness or syncope.   Evaluted in clinic last week, 08/22/20 and was hypertensive. Also very anxious. EKG showed NSR w/ mildly prolonged QTc 474 ms. Labs reviewed and showed hypokalemia. KCl 20 daily was added. Amlodipine 5 mg daily also added. Dr. Shirlee Latch also started sertraline 25 mg daily for marked anxiety.   She returns to clinic today for f/u. Here w/ her mother. Seen in the ED on 2/27 for continued anxiety and palpitations. She reports the ED physician gave her something for possible withdrawals, which has helped significantly. She quit drinking 8 days ago.  BMP was checked on 2/27 and hypokalemia had resolved, K 3.5 (recently as low at 2.9). SCr ok at 0.79.   Today she reports that she took all of her meds except for Coreg and amlodipine because her BP was "too low". Has been checking regularly at home and was 102 systolic this morning. BP in clinic 120/88. Still occasionally feels anxious but gradually improving. Denies dyspnea. No CP. No weight  gain. Notes wt loss. EKG show sinus tach 105 bpm. QT/QTc stable 360/475 ms    ECG (personally reviewed): inus tach 105 bpm. QT/QTc stable 360/475 ms (stable)    Labs (11/20): K 3.8, creatinine 1.1 Labs (2/21): K 3.6, creatinine 0.89 Labs (12/21): BNP 145, K 3.7, creatinine 0.67, hgb 12.2 Labs (2/22): K 3.4, creatinine 0.79, hgb 11.5 Labs (2/22): K 3.5, Creatinine 0.79  PMH: 1. HTN - Renal artery dopplers (3/21): No evidence for renal artery stenosis.  2. ETOH abuse: Has quit.  3. Chronic systolic CHF: Nonischemic cardiomyopathy.  - LHC/RHC (11/20): No  significant CAD; mean RA 1, PA 24/8, mean PCWP 5, CI 3.54.  - Echo (11/20): EF 15%, diffuse hypokinesis, mildly decreased RV systolic function.  - CMRI (11/20): LV mildly dilated with EF 16%, normal RV size with EF 28%, no myocardial LGE.  - Echo (3/21): EF 30-35%, global hypokinesis, mild RV dilation with normal function, mild MR.  - Echo (1/22): EF 40-45%, normal RV.  4. COVID-19 infection 9/21 5. Prolonged QT interval  Review of Systems: All systems reviewed and negative except as per HPI.    Current Outpatient Medications  Medication Sig Dispense Refill  . acetaminophen (TYLENOL) 325 MG tablet Take 650 mg by mouth every 6 (six) hours as needed for moderate pain.    Marland Kitchen amLODipine (NORVASC) 5 MG tablet Take 1 tablet (5 mg total) by mouth daily. 30 tablet 11  . carvedilol (COREG) 12.5 MG tablet Take 18.75 mg by mouth 2 (two) times daily with a meal.    . chlordiazePOXIDE (LIBRIUM) 25 MG capsule Day 1, please take 2 tablets for breakfast, lunch, and dinner. Day 2, please take 1-2 tablets for breakfast and dinner. Day 3 take 1-2 tablets for breakfast only. Take the minimum dose required to alleviate symptoms. 12 capsule 0  . etonogestrel (NEXPLANON) 68 MG IMPL implant Inject 1 each into the skin continuous.    . ivabradine (CORLANOR) 5 MG TABS tablet Take 0.5 tablets (2.5 mg total) by mouth 2 (two) times daily with a meal. 90 tablet 3  . potassium chloride SA (KLOR-CON) 20 MEQ tablet Take 1 tablet (20 mEq total) by mouth daily. 90 tablet 3  . sacubitril-valsartan (ENTRESTO) 97-103 MG Take 1 tablet by mouth 2 (two) times daily. 60 tablet 3  . sertraline (ZOLOFT) 25 MG tablet Take 1 tablet (25 mg total) by mouth daily. 30 tablet 2  . spironolactone (ALDACTONE) 50 MG tablet TAKE (1) TABLET BY MOUTH DAILY (Patient taking differently: Take 50 mg by mouth daily.) 30 tablet 3  . meclizine (ANTIVERT) 25 MG tablet Take 25 mg by mouth 3 (three) times daily as needed for dizziness. (Patient not taking:  Reported on 08/26/2020)     No current facility-administered medications for this encounter.    No Known Allergies    Social History   Socioeconomic History  . Marital status: Single    Spouse name: Not on file  . Number of children: Not on file  . Years of education: Not on file  . Highest education level: Not on file  Occupational History  . Not on file  Tobacco Use  . Smoking status: Never Smoker  . Smokeless tobacco: Never Used  Vaping Use  . Vaping Use: Never used  Substance and Sexual Activity  . Alcohol use: Yes    Comment: occ  . Drug use: No  . Sexual activity: Yes    Birth control/protection: Implant  Other Topics Concern  . Not on  file  Social History Narrative  . Not on file   Social Determinants of Health   Financial Resource Strain: Low Risk   . Difficulty of Paying Living Expenses: Not hard at all  Food Insecurity: No Food Insecurity  . Worried About Programme researcher, broadcasting/film/video in the Last Year: Never true  . Ran Out of Food in the Last Year: Never true  Transportation Needs: No Transportation Needs  . Lack of Transportation (Medical): No  . Lack of Transportation (Non-Medical): No  Physical Activity: Inactive  . Days of Exercise per Week: 0 days  . Minutes of Exercise per Session: 0 min  Stress: No Stress Concern Present  . Feeling of Stress : Not at all  Social Connections: Moderately Integrated  . Frequency of Communication with Friends and Family: More than three times a week  . Frequency of Social Gatherings with Friends and Family: More than three times a week  . Attends Religious Services: More than 4 times per year  . Active Member of Clubs or Organizations: No  . Attends Banker Meetings: Never  . Marital Status: Living with partner  Intimate Partner Violence: Not At Risk  . Fear of Current or Ex-Partner: No  . Emotionally Abused: No  . Physically Abused: No  . Sexually Abused: No      Family History  Problem Relation Age of  Onset  . Diabetes Maternal Grandmother   . Cancer Paternal Grandfather        prancreatic  . COPD Maternal Grandfather   . Cancer Maternal Grandfather        prostate  . Asthma Maternal Grandfather   . Asthma Son   . Bronchitis Son     Vitals:   08/26/20 1121  BP: 120/88  Pulse: (!) 104  SpO2: 99%  Weight: 53.4 kg   PHYSICAL EXAM: General:  Young, mildly anxious appearing female, thin. No respiratory difficulty HEENT: normal Neck: supple. no JVD. Carotids 2+ bilat; no bruits. No lymphadenopathy or thyromegaly appreciated. Cor: PMI nondisplaced. Regular rhythm, mildly tachy rate. No rubs, gallops or murmurs. Lungs: clear Abdomen: soft, nontender, nondistended. No hepatosplenomegaly. No bruits or masses. Good bowel sounds. Extremities: no cyanosis, clubbing, rash, edema Neuro: alert & oriented x 3, cranial nerves grossly intact. moves all 4 extremities w/o difficulty. Affect pleasant.   ASSESSMENT & PLAN:  1. Chronic systolic CHF: Cardiomyopathy of uncertain etiology. No FH cardiomyopathy, negative HIV and normal TSH, no recent pregnancy. She had been drinking heavily for several months prior to diagnosis, ETOH may contribute to her cardiomyopathy. Uncontrolled HTN likely plays a role. LHC 04/2019 w/ no CAD, filling pressures optimized and preserved cardiac output. Cardiac MRI showed no myocardial LGE, so no definitive evidence for prior MI, infiltrative disease, or myocarditis. Echo in 3/21 showed EF 30-35%, echo in 1/22 with EF up to 40-45%, normal RV.  NYHA class II symptoms generally.  Not volume overloaded.  She has quit ETOH.  - Encouraged to stay off ETOH.  - Continue Entresto 97/103 bid.  - Continue spironolactone 50 mg daily.   - Continue reduce Coreg to 3.125 mg bid (due to low BP and fatigue).   - Continue Corlanor 2.5 mg bid.  - Unable to tolerate Bidil due to headache.  - We discussed importance of avoiding pregnancy given risk of fetotoxicity w/ HF meds. She  reports she is compliant w/ OCPs.  - Narrow QRS, not CRT candidate. She is out of ICD range with EF 40-45% on last  echo.   2. HTN: H/o poor control, ? If some of this has been anxiety provoked. She reports less anxiety now w/ Zoloft and BPs have been better controlled at home and running on lower side. No evidence for fibromuscular dysplasia on renal artery dopplers.  - continue regimen outlined in problem #1 - reduce coreg to 3.125 mg bid - advised only to take amlodipine if SBP > 140 - will f/u w/ PharmD in 3-4 weeks to reassess BP and to further adjust meds if needed.  3. ETOH abuse: She reports she has quit ETOH. 4. 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 Remains stable on repeat EKG today, QTc 475 ms - hypokalemia corrected w/ KCl, continue 20 mEq daily  5. Syncope: Uncertain cause, may be related to high dose of CBD in gummy initially.  Had a second episode in 2/22 in setting of markedly high BP. Zio patch in 1/22 showed no worrisome arrhythmias  6. Anxiety: Marked generalized anxiety.   - Improving w/ medical therapy. Continue sertraline 25 mg daily. QTc stable on EKG today   F/u w/ PharmD in 3-4 weeks, APP in 8-10 weeks   Robbie Lis, PA-C 08/26/20

## 2020-08-26 NOTE — Progress Notes (Signed)
Paramedicine Initial Assessment:  Housing:  In what kind of housing do you live? House/apt/trailer/shelter? house  Do you rent/pay a mortgage/own? rent  Do you live with anyone? Boyfriend and 2 minor children  Are you currently worried about losing your housing? no  Within the past 12 months have you ever stayed outside, in a car, tent, a shelter, or temporarily with someone? no  Within the past 12 months have you been unable to get utilities when it was really needed? no  Social:  What is your current marital status? Single  Do you have any children? 2  Food:  Within the past 12 months were you ever worried that food would run out before you got money to buy more? no  Within the past 90months have you run out of food and didn't have money to buy more? No  Receiving food stamps  Income:  What is your current source of income? 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  Boyfriend who lives with he gets disability benefits   How hard is it for you to pay for the basics like food housing, medical care, and utilities?not very hard  Do you have outstanding medical bills? no  Insurance:  Are you currently insured? Medicaid- thinks this is because her children get medicaid  Do you have prescription coverage? yes  If no insurance, have you applied for coverage (Medicaid, disability, marketplace etc)? Pt was trying to apply for disability about 2 months ago and got a lawyer involved to assist but hasn't heard anything.  CSW encouraged pt to reach out to them to inquire about status- she isn't sure they are still involved.  CSW encouraged pt to call law office to inquire about case and confirm whether or not she has officially applied and they are helping.  If they aren't helping and she hasn't submitted an official application CSW encouraged her to reach out so we can refer to [facility]  Transportation:  Do you have  transportation to your medical appointments? Yes owns a car  In the past 12 months has lack of transportation kept you from medical appts or from getting medications? no  In the past 12 months has lack of transportation kept you from meetings, work, or getting things you needed? no   Daily Health Needs: Do you have a working scale at home? Yes but only weighs once a week- CSW educated on importance of weighing daily to track fluid retention.  How do you manage your medications at home? Out of the bottle  Do you ever take your medications differently than prescribed? Sometimes- this morning didn't take BP meds because her BP was already low  Do you have issues affording your medications? no  If yes, has this ever prevented you from obtaining medications? no  Do you have any concerns with mobility at home? no  Do you use any assistive devices at home or have PCS at home? no  Do you have a PCP? Health department   Do you have any trouble reading or writing? Not asked  Are there any additional barriers you see to getting the care you need? Pt did report recently quitting drinking.  Admitted to having about 10 air plane bottles a day and stopped about 8 days ago.  Pt does not feel as if she needs support at this time to quit drinking- is motivated by staying healthy for her children and focusing on her  health.  Encouraged her to reach out if she struggles with stopping on her own and needs additional support or resources.  CSW will continue to follow through paramedicine program and assist as needed.   Burna Sis, LCSW Clinical Social Worker Advanced Heart Failure Clinic Desk#: [phone] Cell#: [phone]

## 2020-08-26 NOTE — Telephone Encounter (Signed)
Attempted to contact patient for appt call reminder No answer and voicemail full for Home number Call unable to complete on cell number

## 2020-08-26 NOTE — Patient Instructions (Signed)
DECREASE Coreg to 3.125 mg, one tab twice a day CHANGE Novasc to as needed for SBP (top number) greater than 140  Your physician recommends that you schedule a follow-up appointment in: 3-4 weeks with the pharmacy team  Your physician recommends that you schedule a follow-up appointment in: 8-10 weeks with  in the Advanced Practitioners (PA/NP) Clinic   You have been referred to [facility] for further CHF management in the comfort of your home. A team member will be in contact with you to arrange a home visit  .Do the following things EVERYDAY: 1) Weigh yourself in the morning before breakfast. Write it down and keep it in a log. 2) Take your medicines as prescribed 3) Eat low salt foods--Limit salt (sodium) to 2000 mg per day.  4) Stay as active as you can everyday 5) Limit all fluids for the day to less than 2 liters  At the [facility], you and your health needs are our priority. As part of our continuing mission to provide you with exceptional heart care, we have created designated Provider Care Teams. These Care Teams include your primary Cardiologist (physician) and Advanced Practice Providers (APPs- Physician Assistants and Nurse Practitioners) who all work together to provide you with the care you need, when you need it.   You may see any of the following providers on your designated Care Team at your next follow up: Marland Kitchen Dr Arvilla Meres . Dr Marca Ancona . Dr Thornell Mule . Tonye Becket, NP . Robbie Lis, PA . Shanda Bumps [place],NP . Karle Plumber, PharmD   Please be sure to bring in all your medications bottles to every appointment.    If you have any questions or concerns before your next appointment please send [place] a message through [place] or call our office at [phone].    TO LEAVE A MESSAGE FOR THE NURSE SELECT OPTION 2, PLEASE LEAVE A MESSAGE INCLUDING: . YOUR NAME . DATE OF BIRTH . CALL BACK NUMBER . REASON FOR  CALL**this is important as we prioritize the call backs  YOU WILL RECEIVE A CALL BACK THE SAME DAY AS LONG AS YOU CALL BEFORE 4:00 PM  Please see our updated No Show and Same Day Appointment Cancellation Policy attached to your AVS.

## 2020-08-28 ENCOUNTER — Telehealth (HOSPITAL_COMMUNITY): Payer: Self-pay | Admitting: *Deleted

## 2020-08-28 ENCOUNTER — Telehealth (HOSPITAL_COMMUNITY): Payer: Self-pay

## 2020-08-28 ENCOUNTER — Other Ambulatory Visit: Payer: Self-pay

## 2020-08-28 ENCOUNTER — Emergency Department (HOSPITAL_COMMUNITY)
Admission: EM | Admit: 2020-08-28 | Discharge: 2020-08-28 | Discharge status: Against Medical Advice | Payer: Medicaid Other

## 2020-08-28 ENCOUNTER — Emergency Department (HOSPITAL_COMMUNITY)
Admission: EM | Admit: 2020-08-28 | Discharge: 2020-08-28 | Disposition: A | Discharge status: Home / Self Care | Payer: Medicaid Other | Attending: Emergency Medicine | Admitting: Emergency Medicine

## 2020-08-28 DIAGNOSIS — I959 Hypotension, unspecified: Secondary | ICD-10-CM | POA: Diagnosis not present

## 2020-08-28 DIAGNOSIS — Z5321 Procedure and treatment not carried out due to patient leaving prior to being seen by health care provider: Secondary | ICD-10-CM | POA: Insufficient documentation

## 2020-08-28 NOTE — ED Triage Notes (Signed)
Pt states her bp was low today. She checked her bp at home and stated that she called her cardiologist and was told to come here and get it checked and if its in normal limits she doesn't have to be seen. Told pt her bp was 125/96 and pt states she was told that she could leave by her cardiology and she didn't wana be checked out by the EDP.

## 2020-08-28 NOTE — Telephone Encounter (Signed)
Spoke with pt her bp was 125/96 when she was seen in the ED. Pt no longer c/o dizziness. Pt will be seen tomorrow for nurse visit to have bp check, fill box, and med education.

## 2020-08-28 NOTE — Telephone Encounter (Signed)
Spoke to Ms. Garraway who agreed to home visit for Monday and reminded of nurse visit tomorrow. Patient agreeable to plan. Call complete.

## 2020-08-28 NOTE — Telephone Encounter (Signed)
Pt left vm requesting return call. I called pt back no answer/left vm requesting return call.

## 2020-08-28 NOTE — ED Triage Notes (Signed)
Pt says she doesn't want to check in, she just wants to get her BP checked, pt says her cardiologist told her to come here to check it.

## 2020-08-29 ENCOUNTER — Other Ambulatory Visit: Payer: Self-pay

## 2020-08-29 ENCOUNTER — Emergency Department (HOSPITAL_COMMUNITY)
Admission: EM | Admit: 2020-08-29 | Discharge: 2020-08-30 | Disposition: A | Discharge status: Home / Self Care | Payer: Medicaid Other | Attending: Emergency Medicine | Admitting: Emergency Medicine

## 2020-08-29 ENCOUNTER — Encounter (HOSPITAL_COMMUNITY): Payer: Medicaid Other

## 2020-08-29 ENCOUNTER — Encounter (HOSPITAL_COMMUNITY): Payer: Self-pay

## 2020-08-29 ENCOUNTER — Ambulatory Visit (HOSPITAL_COMMUNITY)
Admission: RE | Admit: 2020-08-29 | Discharge: 2020-08-29 | Disposition: A | Discharge status: Home / Self Care | Payer: Medicaid Other | Source: Ambulatory Visit | Attending: Cardiology | Admitting: Cardiology

## 2020-08-29 VITALS — BP 110/70 | HR 98

## 2020-08-29 DIAGNOSIS — R531 Weakness: Secondary | ICD-10-CM | POA: Insufficient documentation

## 2020-08-29 DIAGNOSIS — I110 Hypertensive heart disease with heart failure: Secondary | ICD-10-CM | POA: Insufficient documentation

## 2020-08-29 DIAGNOSIS — Z79899 Other long term (current) drug therapy: Secondary | ICD-10-CM | POA: Insufficient documentation

## 2020-08-29 DIAGNOSIS — Z0130 Encounter for examination of blood pressure without abnormal findings: Secondary | ICD-10-CM | POA: Insufficient documentation

## 2020-08-29 DIAGNOSIS — I5022 Chronic systolic (congestive) heart failure: Secondary | ICD-10-CM | POA: Insufficient documentation

## 2020-08-29 DIAGNOSIS — R55 Syncope and collapse: Secondary | ICD-10-CM

## 2020-08-29 DIAGNOSIS — Z8679 Personal history of other diseases of the circulatory system: Secondary | ICD-10-CM

## 2020-08-29 MED ORDER — ENTRESTO 97-103 MG PO TABS: ORAL_TABLET | ORAL | 0 refills | Status: DC

## 2020-08-29 MED ORDER — SODIUM CHLORIDE 0.9 % IV BOLUS
500.0000 mL | Freq: Once | INTRAVENOUS | Status: AC
  Administered 2020-08-29: 500 mL via INTRAVENOUS

## 2020-08-29 MED ORDER — SODIUM CHLORIDE 0.9 % IV BOLUS: 1000.0000 mL | Freq: Once | INTRAVENOUS | Status: DC

## 2020-08-29 NOTE — Progress Notes (Signed)
Patient seen in clinic for nurse visit post ED. Patient was seen in the emergency room yesterday because her blood pressure was 117/77 and patient felt dizzy. Patient bp today at 10:00am 110/70 after taking morning medications at 7:30am.  Patient stated she stopped spironolactone last Sunday because it was making her blood pressure too low. Pt lost 17lbs in the last two weeks. Pt c/o no appetite. Per Dr.McLean decrease entresto to 49/51mg  twice daily, continue to hold spironolactone, only take amlodipine if systolic bp is greater than 140. Pt instructed to take bp once daily 1-2 hours after morning medications and to keep a log. Patient will have a home visit with paramedicine Monday. Pt brought all of her medications to nurse visit. I went over all med changes and we took the medication bottles pt is not taking and placed in a paper bag labeled do not take. Pt medication box filled with correct medications. Pt aware of all changes and verbalized understanding.

## 2020-08-29 NOTE — ED Provider Notes (Signed)
[facility] EMERGENCY DEPARTMENT Provider Note   CSN: [id number] Arrival date & time: 08/29/20  1957     History Chief Complaint  Patient presents with  . Near Syncope    Tina [facility] is a 35 y.o. female.  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  Patient is followed by Dr. Shirlee Latch in [place].  She presents today for evaluation of weakness and syncope.  Patient states that her blood pressure has been running low recently.  She had recent blood pressure checks here in the ER which were not concerning.  She went to the drugstore today and checked her blood pressure it was 100/65.  She then went home and had some sort of syncopal episode.  She reports several syncopal episodes over the past 10 days.  During this period of time, she has stopped consuming alcohol.  She was initially on a Librium taper, however has since finished this medication.  The history is provided by the patient.  Near Syncope This is a recurrent problem. The problem occurs every several days. Pertinent negatives include no chest pain and no shortness of breath. Nothing aggravates the symptoms. Nothing relieves the symptoms. She has tried nothing for the symptoms.       Past Medical History:  Diagnosis Date  . Abnormal menstrual periods 02/12/2014  . CHF (congestive heart failure) (HCC)   . Herpes simplex without mention of complication   . Hypertension   . Medical history non-contributory   . Migraines   . Nexplanon insertion 08/13/2013   08/13/13 inserted left arm, remove 08/13/16  . Vitamin D deficiency 04/01/2016    Patient Active Problem List   Diagnosis Date Noted  . Hypertensive crisis 08/18/2020  . Hypokalemia 08/18/2020  . Prolonged QT interval 08/18/2020  . Syncope   . Chronic systolic CHF (congestive heart failure) (HCC)   . Cervical cancer screening 10/23/2019  . Systolic CHF, acute (HCC)  05/10/2019  . Vitamin D deficiency 04/01/2016  . Nexplanon insertion 08/13/2013  . Thrombocytopenia (HCC) 04/17/2013  . 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 Underweight 01/08/2013    Past Surgical History:  Procedure Laterality Date  . RIGHT/LEFT HEART CATH AND CORONARY ANGIOGRAPHY N/A 05/14/2019   Procedure: RIGHT/LEFT HEART CATH AND CORONARY ANGIOGRAPHY;  Surgeon: Laurey Morale, MD;  Location: [facility] INVASIVE CV LAB;  Service: Cardiovascular;  Laterality: N/A;     OB History    Gravida  2   Para  2   Term  2   Preterm      AB      Living  2     SAB      IAB      Ectopic      Multiple      Live Births  2           Family History  Problem Relation Age of Onset  . Diabetes Maternal Grandmother   . Cancer Paternal Grandfather        prancreatic  . COPD Maternal Grandfather   . Cancer Maternal Grandfather        prostate  . Asthma Maternal Grandfather   . Asthma Son   . Bronchitis Son     Social History   Tobacco Use  . Smoking status: Never Smoker  . Smokeless tobacco: Never Used  Vaping Use  . Vaping Use: Never used  Substance Use Topics  . Alcohol use: Yes  Comment: occ  . Drug use: No    Home Medications Prior to Admission medications   Medication Sig Start Date End Date Taking? Authorizing Provider  acetaminophen (TYLENOL) 325 MG tablet Take 650 mg by mouth every 6 (six) hours as needed for moderate pain.    [provider]  amLODipine (NORVASC) 5 MG tablet Take 1 tablet (5 mg total) by mouth daily. Only take if SBP greater than 140 08/26/20 08/26/21  Simmons, Avon Gully M, PA-C  carvedilol (COREG) 3.125 MG tablet Take 1 tablet (3.125 mg total) by mouth 2 (two) times daily with a meal. 08/26/20   Robbie Lis M, PA-C  chlordiazePOXIDE (LIBRIUM) 25 MG capsule Day 1, please take 2 tablets for breakfast, lunch, and dinner. Day 2, please take 1-2 tablets for breakfast and dinner. Day 3 take 1-2 tablets for breakfast only. Take the minimum  dose required to alleviate symptoms. 08/24/20   Gerhard Munch, MD  etonogestrel (NEXPLANON) 68 MG IMPL implant Inject 1 each into the skin continuous.    [provider]  ivabradine (CORLANOR) 5 MG TABS tablet Take 0.5 tablets (2.5 mg total) by mouth 2 (two) times daily with a meal. 07/01/20   Laurey Morale, MD  meclizine (ANTIVERT) 25 MG tablet Take 25 mg by mouth 3 (three) times daily as needed for dizziness. Patient not taking: Reported on 08/26/2020 08/23/20 09/22/20  [provider]  potassium chloride SA (KLOR-CON) 20 MEQ tablet Take 1 tablet (20 mEq total) by mouth daily. 08/22/20   Laurey Morale, MD  sacubitril-valsartan [facility]) [phone] MG Take 0.5 tablets twice daily 08/29/20   Laurey Morale, MD  sertraline (ZOLOFT) 25 MG tablet Take 1 tablet (25 mg total) by mouth daily. 08/22/20 08/22/21  Laurey Morale, MD    Allergies    Patient has no known allergies.  Review of Systems   Review of Systems  Respiratory: Negative for shortness of breath.   Cardiovascular: Positive for near-syncope. Negative for chest pain.  All other systems reviewed and are negative.   Physical Exam Updated Vital Signs BP 99/73 (BP Location: Right Arm)   Pulse 83   Temp 98.2 F (36.8 C) (Oral)   Resp 17   Ht 5\' 9"  (1.753 m)   Wt 54.4 kg   SpO2 100%   BMI 17.72 kg/m   Physical Exam Vitals and nursing note reviewed.  Constitutional:      General: She is not in acute distress.    Appearance: She is well-developed and well-nourished. She is not diaphoretic.  HENT:     Head: Normocephalic and atraumatic.  Eyes:     Extraocular Movements: Extraocular movements intact.     Pupils: Pupils are equal, round, and reactive to light.  Cardiovascular:     Rate and Rhythm: Normal rate and regular rhythm.     Heart sounds: No murmur heard. No friction rub. No gallop.   Pulmonary:     Effort: Pulmonary effort is normal. No respiratory distress.     Breath sounds: Normal breath  sounds. No wheezing.  Abdominal:     General: Bowel sounds are normal. There is no distension.     Palpations: Abdomen is soft.     Tenderness: There is no abdominal tenderness.  Musculoskeletal:        General: Normal range of motion.     Cervical back: Normal range of motion and neck supple.  Skin:    General: Skin is warm and dry.  Neurological:  General: No focal deficit present.     Mental Status: She is alert and oriented to person, place, and time.     Cranial Nerves: No cranial nerve deficit.     Motor: No weakness.     Coordination: Coordination normal.     ED Results / Procedures / Treatments   Labs (all labs ordered are listed, but only abnormal results are displayed) Labs Reviewed - No data to display  EKG None  Radiology No results found.  Procedures Procedures   Medications Ordered in ED Medications  sodium chloride 0.9 % bolus 1,000 mL (has no administration in time range)    ED Course  I have reviewed the triage vital signs and the nursing notes.  Pertinent labs & imaging results that were available during my care of the patient were reviewed by me and considered in my medical decision making (see chart for details).    MDM Rules/Calculators/A&P  Patient with medical history as per HPI presenting with complaints of syncope and low blood pressure.  Patient does have some hypotension here in the ER which corrected after IV fluids.  Her work-up shows no significant laboratory abnormality.  Patient appears very comfortable and is sleeping in the exam room.  At this point, I see no indication to keep her in the hospital.  She will be discharged with follow-up this week with her cardiologist.  Final Clinical Impression(s) / ED Diagnoses Final diagnoses:  None    Rx / DC Orders ED Discharge Orders    None       Geoffery Lyons, MD 08/30/20 0400

## 2020-08-29 NOTE — Patient Instructions (Signed)
Continue to hold spironolactone  Decrease Entresto to 49/51mg  twice daily (you can cut your entresto 97/103mg  tablet in half and take half a tablet twice daily)  Do not take amlodipine unless your systolic bp is greater than 140

## 2020-08-29 NOTE — ED Triage Notes (Addendum)
Pt reports waking up, going to [employer] to check BP and it was 100/65. Pt then says she went home, and walked to bathroom and "passed out". Pt says these episodes usually last "seconds" pt says she "remembers passing out"

## 2020-08-30 LAB — COMPREHENSIVE METABOLIC PANEL
ALT: 21 U/L (ref 0–44)
AST: 20 U/L (ref 15–41)
Albumin: 4.5 g/dL (ref 3.5–5.0)
Alkaline Phosphatase: 30 U/L — ABNORMAL LOW (ref 38–126)
Anion gap: 10 (ref 5–15)
BUN: 12 mg/dL (ref 6–20)
CO2: 23 mmol/L (ref 22–32)
Calcium: 8.8 mg/dL — ABNORMAL LOW (ref 8.9–10.3)
Chloride: 103 mmol/L (ref 98–111)
Creatinine, Ser: 0.8 mg/dL (ref 0.44–1.00)
GFR, Estimated: >60 mL/min (ref >60)
Glucose, Bld: 103 mg/dL — ABNORMAL HIGH (ref 70–99)
Potassium: 3.8 mmol/L (ref 3.5–5.1)
Sodium: 136 mmol/L (ref 135–145)
Total Bilirubin: 0.5 mg/dL (ref 0.3–1.2)
Total Protein: 6.9 g/dL (ref 6.5–8.1)

## 2020-08-30 LAB — TROPONIN I (HIGH SENSITIVITY)
Troponin I (High Sensitivity): <2 ng/L (ref <18)
Troponin I (High Sensitivity): <2 ng/L (ref <18)

## 2020-08-30 LAB — CBC WITH DIFFERENTIAL/PLATELET
Abs Immature Granulocytes: 0.01 10*3/uL (ref 0.00–0.07)
Basophils Absolute: 0 10*3/uL (ref 0.0–0.1)
Basophils Relative: 1 %
Eosinophils Absolute: 0.1 10*3/uL (ref 0.0–0.5)
Eosinophils Relative: 2 %
HCT: 36.2 % (ref 36.0–46.0)
Hemoglobin: 11.7 g/dL — ABNORMAL LOW (ref 12.0–15.0)
Immature Granulocytes: 0 %
Lymphocytes Relative: 48 %
Lymphs Abs: 1.9 10*3/uL (ref 0.7–4.0)
MCH: 29.5 pg (ref 26.0–34.0)
MCHC: 32.3 g/dL (ref 30.0–36.0)
MCV: 91.4 fL (ref 80.0–100.0)
Monocytes Absolute: 0.4 10*3/uL (ref 0.1–1.0)
Monocytes Relative: 10 %
Neutro Abs: 1.6 10*3/uL — ABNORMAL LOW (ref 1.7–7.7)
Neutrophils Relative %: 39 %
Platelets: 205 10*3/uL (ref 150–400)
RBC: 3.96 MIL/uL (ref 3.87–5.11)
RDW: 12.9 % (ref 11.5–15.5)
WBC: 4.1 10*3/uL (ref 4.0–10.5)
nRBC: 0 % (ref 0.0–0.2)

## 2020-08-30 MED ORDER — SODIUM CHLORIDE 0.9 % IV BOLUS
1000.0000 mL | Freq: Once | INTRAVENOUS | Status: AC
  Administered 2020-08-30: 1000 mL via INTRAVENOUS

## 2020-08-30 NOTE — Discharge Instructions (Addendum)
Follow-up with your cardiologist next week, and return to the ER if symptoms significantly worsen or change.

## 2020-09-01 ENCOUNTER — Telehealth: Payer: Self-pay | Admitting: *Deleted

## 2020-09-01 ENCOUNTER — Other Ambulatory Visit (HOSPITAL_COMMUNITY): Payer: Self-pay

## 2020-09-01 NOTE — Progress Notes (Signed)
Paramedicine Encounter    Patient ID: Tina Fletcher, female    DOB: Aug 17, 1985, 35 y.o.   MRN: [id number]   Patient Care Team: Laurey Morale, MD as PCP - General (Cardiology)  Patient Active Problem List   Diagnosis Date Noted  . Hypertensive crisis 08/18/2020  . Hypokalemia 08/18/2020  . Prolonged QT interval 08/18/2020  . Syncope   . Chronic systolic CHF (congestive heart failure) (HCC)   . Cervical cancer screening 10/23/2019  . Systolic CHF, acute (HCC) 05/10/2019  . Vitamin D deficiency 04/01/2016  . Nexplanon insertion 08/13/2013  . Thrombocytopenia (HCC) 04/17/2013  . 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 Underweight 01/08/2013    Current Outpatient Medications:  .  acetaminophen (TYLENOL) 325 MG tablet, Take 650 mg by mouth every 6 (six) hours as needed for moderate pain., Disp: , Rfl:  .  amLODipine (NORVASC) 5 MG tablet, Take 1 tablet (5 mg total) by mouth daily. Only take if SBP greater than 140, Disp: 30 tablet, Rfl: 11 .  carvedilol (COREG) 3.125 MG tablet, Take 1 tablet (3.125 mg total) by mouth 2 (two) times daily with a meal., Disp: 60 tablet, Rfl: 6 .  chlordiazePOXIDE (LIBRIUM) 25 MG capsule, Day 1, please take 2 tablets for breakfast, lunch, and dinner. Day 2, please take 1-2 tablets for breakfast and dinner. Day 3 take 1-2 tablets for breakfast only. Take the minimum dose required to alleviate symptoms., Disp: 12 capsule, Rfl: 0 .  etonogestrel (NEXPLANON) 68 MG IMPL implant, Inject 1 each into the skin continuous., Disp: , Rfl:  .  ivabradine (CORLANOR) 5 MG TABS tablet, Take 0.5 tablets (2.5 mg total) by mouth 2 (two) times daily with a meal., Disp: 90 tablet, Rfl: 3 .  meclizine (ANTIVERT) 25 MG tablet, Take 25 mg by mouth 3 (three) times daily as needed for dizziness. (Patient not taking: Reported on 08/26/2020), Disp: , Rfl:  .  potassium chloride SA (KLOR-CON) 20 MEQ tablet, Take 1 tablet (20 mEq total) by mouth daily., Disp: 90 tablet, Rfl: 3 .   sacubitril-valsartan (ENTRESTO) 97-103 MG, Take 0.5 tablets twice daily, Disp: 60 tablet, Rfl: 0 .  sertraline (ZOLOFT) 25 MG tablet, Take 1 tablet (25 mg total) by mouth daily., Disp: 30 tablet, Rfl: 2 No Known Allergies   Social History   Socioeconomic History  . Marital status: Single    Spouse name: Not on file  . Number of children: Not on file  . Years of education: Not on file  . Highest education level: Not on file  Occupational History  . Not on file  Tobacco Use  . Smoking status: Never Smoker  . Smokeless tobacco: Never Used  Vaping Use  . Vaping Use: Never used  Substance and Sexual Activity  . Alcohol use: Yes    Comment: occ  . Drug use: No  . Sexual activity: Yes    Birth control/protection: Implant  Other Topics Concern  . Not on file  Social History Narrative  . Not on file   Social Determinants of Health   Financial Resource Strain: Low Risk   . Difficulty of Paying Living Expenses: Not hard at all  Food Insecurity: No Food Insecurity  . Worried About Programme researcher, broadcasting/film/video in the Last Year: Never true  . Ran Out of Food in the Last Year: Never true  Transportation Needs: No Transportation Needs  . Lack of Transportation (Medical): No  . Lack of Transportation (Non-Medical): No  Physical Activity: Inactive  . Days  of Exercise per Week: 0 days  . Minutes of Exercise per Session: 0 min  Stress: No Stress Concern Present  . Feeling of Stress : Not at all  Social Connections: Moderately Integrated  . Frequency of Communication with Friends and Family: More than three times a week  . Frequency of Social Gatherings with Friends and Family: More than three times a week  . Attends Religious Services: More than 4 times per year  . Active Member of Clubs or Organizations: No  . Attends Banker Meetings: Never  . Marital Status: Living with partner  Intimate Partner Violence: Not At Risk  . Fear of Current or Ex-Partner: No  . Emotionally  Abused: No  . Physically Abused: No  . Sexually Abused: No    Physical Exam Vitals reviewed.  Constitutional:      Appearance: She is normal weight.  HENT:     Head: Normocephalic.     Nose: Nose normal.     Mouth/Throat:     Mouth: Mucous membranes are moist.     Pharynx: Oropharynx is clear.  Eyes:     Conjunctiva/sclera: Conjunctivae normal.     Pupils: Pupils are equal, round, and reactive to light.  Cardiovascular:     Rate and Rhythm: Normal rate and regular rhythm.     Pulses: Normal pulses.     Heart sounds: Normal heart sounds.  Pulmonary:     Effort: Pulmonary effort is normal.     Breath sounds: Normal breath sounds.  Abdominal:     General: Abdomen is flat.     Palpations: Abdomen is soft.  Musculoskeletal:        General: Normal range of motion.     Cervical back: Normal range of motion.     Right lower leg: No edema.     Left lower leg: No edema.  Skin:    General: Skin is warm and dry.     Capillary Refill: Capillary refill takes less than 2 seconds.  Neurological:     General: No focal deficit present.     Mental Status: She is alert. Mental status is at baseline.  Psychiatric:        Mood and Affect: Mood normal.    Arrived for home visit for [facility] who was alert and oriented seated on her couch. Lareina reports she was feeling okay today but was worried about her blood pressure. She denied shortness of breath, dizziness, chest pain today.  Alaysiah reports she has not been eating and drinking as much as she should and has been losing weight rapidly. I explained to her that this paired with her medications is causing her blood pressure to be low and the importance of eating and drinking properly because of this. She verbally agreed with me and reports she will try to do better this week. I obtained vitals and reviewed them with her. Her automatic BP cuff and a manual BP were close in numbers and I advised her to only check her BP a few times a day and  to call me before calling 911 if she has any concerns or questions. She agreed with plan. I reviewed medications with her and pill box was filled accordingly. She is currently not taking Carvedilol due to low BP and HF clinic nurse made aware and reports Dr. Shirlee Latch is okay with this at this time. I advised Keith we will meet again in one week. She agreed with plan. Home visit complete.  Refills: NONE  Future Appointments  Date Time Provider Department Center  09/29/2020  1:00 PM [facility] PHARMACY [facility] None  10/28/2020  9:30 AM [facility] PA/NP [facility] None     ACTION: Home visit completed Next visit planned for one week

## 2020-09-01 NOTE — Telephone Encounter (Signed)
Transition Care Management Unsuccessful Follow-up Telephone Call  Date of discharge and from where:  08/30/2020 - [facility]  Attempts:  1st Attempt  Reason for unsuccessful TCM follow-up call:  Voice mail full

## 2020-09-02 ENCOUNTER — Encounter (HOSPITAL_COMMUNITY): Payer: Self-pay | Admitting: Emergency Medicine

## 2020-09-02 ENCOUNTER — Encounter: Payer: Self-pay | Admitting: Emergency Medicine

## 2020-09-02 ENCOUNTER — Emergency Department (HOSPITAL_COMMUNITY)
Admission: EM | Admit: 2020-09-02 | Discharge: 2020-09-02 | Disposition: A | Discharge status: Home / Self Care | Payer: Medicaid Other | Attending: Emergency Medicine | Admitting: Emergency Medicine

## 2020-09-02 ENCOUNTER — Other Ambulatory Visit: Payer: Self-pay

## 2020-09-02 ENCOUNTER — Ambulatory Visit
Admission: EM | Admit: 2020-09-02 | Discharge: 2020-09-02 | Disposition: A | Discharge status: Home / Self Care | Payer: Medicaid Other

## 2020-09-02 DIAGNOSIS — I5022 Chronic systolic (congestive) heart failure: Secondary | ICD-10-CM | POA: Diagnosis not present

## 2020-09-02 DIAGNOSIS — R5383 Other fatigue: Secondary | ICD-10-CM | POA: Insufficient documentation

## 2020-09-02 DIAGNOSIS — R002 Palpitations: Secondary | ICD-10-CM | POA: Diagnosis not present

## 2020-09-02 DIAGNOSIS — Z79899 Other long term (current) drug therapy: Secondary | ICD-10-CM | POA: Insufficient documentation

## 2020-09-02 DIAGNOSIS — I110 Hypertensive heart disease with heart failure: Secondary | ICD-10-CM | POA: Insufficient documentation

## 2020-09-02 DIAGNOSIS — R42 Dizziness and giddiness: Secondary | ICD-10-CM | POA: Diagnosis present

## 2020-09-02 DIAGNOSIS — R55 Syncope and collapse: Secondary | ICD-10-CM | POA: Insufficient documentation

## 2020-09-02 DIAGNOSIS — R031 Nonspecific low blood-pressure reading: Secondary | ICD-10-CM | POA: Diagnosis not present

## 2020-09-02 LAB — BASIC METABOLIC PANEL
Anion gap: 7 (ref 5–15)
BUN: 12 mg/dL (ref 6–20)
CO2: 24 mmol/L (ref 22–32)
Calcium: 9.1 mg/dL (ref 8.9–10.3)
Chloride: 104 mmol/L (ref 98–111)
Creatinine, Ser: 1.01 mg/dL — ABNORMAL HIGH (ref 0.44–1.00)
GFR, Estimated: >60 mL/min (ref >60)
Glucose, Bld: 99 mg/dL (ref 70–99)
Potassium: 3.5 mmol/L (ref 3.5–5.1)
Sodium: 135 mmol/L (ref 135–145)

## 2020-09-02 LAB — URINALYSIS, ROUTINE W REFLEX MICROSCOPIC
Bacteria, UA: NONE SEEN
Bilirubin Urine: NEGATIVE
Glucose, UA: NEGATIVE mg/dL
Hgb urine dipstick: NEGATIVE
Ketones, ur: NEGATIVE mg/dL
Nitrite: NEGATIVE
Protein, ur: NEGATIVE mg/dL
Specific Gravity, Urine: 1.01 (ref 1.005–1.030)
pH: 6 (ref 5.0–8.0)

## 2020-09-02 LAB — ETHANOL: Alcohol, Ethyl (B): <10 mg/dL (ref <10)

## 2020-09-02 LAB — RAPID URINE DRUG SCREEN, HOSP PERFORMED
Amphetamines: NOT DETECTED
Barbiturates: NOT DETECTED
Benzodiazepines: POSITIVE — AB
Cocaine: NOT DETECTED
Opiates: NOT DETECTED
Tetrahydrocannabinol: NOT DETECTED

## 2020-09-02 LAB — CBC
HCT: 34.2 % — ABNORMAL LOW (ref 36.0–46.0)
Hemoglobin: 11 g/dL — ABNORMAL LOW (ref 12.0–15.0)
MCH: 29.2 pg (ref 26.0–34.0)
MCHC: 32.2 g/dL (ref 30.0–36.0)
MCV: 90.7 fL (ref 80.0–100.0)
Platelets: 178 10*3/uL (ref 150–400)
RBC: 3.77 MIL/uL — ABNORMAL LOW (ref 3.87–5.11)
RDW: 12.8 % (ref 11.5–15.5)
WBC: 4.8 10*3/uL (ref 4.0–10.5)
nRBC: 0 % (ref 0.0–0.2)

## 2020-09-02 LAB — POC URINE PREG, ED: Preg Test, Ur: NEGATIVE

## 2020-09-02 NOTE — ED Provider Notes (Signed)
[facility] EMERGENCY DEPARTMENT Provider Note   CSN: [id number] Arrival date & time: 09/02/20  2031     History Chief Complaint  Patient presents with  . Near Syncope    Tina Fletcher is a 35 y.o. female.  HPI She complains of persistent symptoms of lightheadedness, and fatigue, associated with palpitations and "low blood pressure," for several days.  She saw her PCP, 1 week ago at that time her blood pressure medicine was lowered because of periods of hypotension.  She reports she continues to have the symptoms despite that change.  She denies chest pain.  She denies fever, chills, cough, shortness of breath.  She is taking her usual medicines, as prescribed.  There are no other known modifying factors.    Past Medical History:  Diagnosis Date  . Abnormal menstrual periods 02/12/2014  . CHF (congestive heart failure) (HCC)   . Herpes simplex without mention of complication   . Hypertension   . Medical history non-contributory   . Migraines   . Nexplanon insertion 08/13/2013   08/13/13 inserted left arm, remove 08/13/16  . Vitamin D deficiency 04/01/2016    Patient Active Problem List   Diagnosis Date Noted  . Hypertensive crisis 08/18/2020  . Hypokalemia 08/18/2020  . Prolonged QT interval 08/18/2020  . Syncope   . Chronic systolic CHF (congestive heart failure) (HCC)   . Cervical cancer screening 10/23/2019  . Systolic CHF, acute (HCC) 05/10/2019  . Vitamin D deficiency 04/01/2016  . Nexplanon insertion 08/13/2013  . Thrombocytopenia (HCC) 04/17/2013  . 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 Underweight 01/08/2013    Past Surgical History:  Procedure Laterality Date  . RIGHT/LEFT HEART CATH AND CORONARY ANGIOGRAPHY N/A 05/14/2019   Procedure: RIGHT/LEFT HEART CATH AND CORONARY ANGIOGRAPHY;  Surgeon: Laurey Morale, MD;  Location: [facility] INVASIVE CV LAB;  Service: Cardiovascular;  Laterality: N/A;     OB History    Gravida  2   Para  2   Term  2   Preterm       AB      Living  2     SAB      IAB      Ectopic      Multiple      Live Births  2           Family History  Problem Relation Age of Onset  . Diabetes Maternal Grandmother   . Cancer Paternal Grandfather        prancreatic  . COPD Maternal Grandfather   . Cancer Maternal Grandfather        prostate  . Asthma Maternal Grandfather   . Asthma Son   . Bronchitis Son     Social History   Tobacco Use  . Smoking status: Never Smoker  . Smokeless tobacco: Never Used  Vaping Use  . Vaping Use: Never used  Substance Use Topics  . Alcohol use: Yes    Comment: occ  . Drug use: No    Home Medications Prior to Admission medications   Medication Sig Start Date End Date Taking? Authorizing Provider  acetaminophen (TYLENOL) 325 MG tablet Take 650 mg by mouth every 6 (six) hours as needed for moderate pain.    [provider]  amLODipine (NORVASC) 5 MG tablet Take 1 tablet (5 mg total) by mouth daily. Only take if SBP greater than 140 08/26/20 08/26/21  Simmons, Avon Gully M, PA-C  carvedilol (COREG) 3.125 MG tablet Take 1 tablet (3.125 mg total)  by mouth 2 (two) times daily with a meal. Patient not taking: Reported on 09/01/2020 08/26/20   Robbie Lis M, PA-C  chlordiazePOXIDE (LIBRIUM) 25 MG capsule Day 1, please take 2 tablets for breakfast, lunch, and dinner. Day 2, please take 1-2 tablets for breakfast and dinner. Day 3 take 1-2 tablets for breakfast only. Take the minimum dose required to alleviate symptoms. 08/24/20   Gerhard Munch, MD  etonogestrel (NEXPLANON) 68 MG IMPL implant Inject 1 each into the skin continuous.    [provider]  ivabradine (CORLANOR) 5 MG TABS tablet Take 0.5 tablets (2.5 mg total) by mouth 2 (two) times daily with a meal. 07/01/20   Laurey Morale, MD  meclizine (ANTIVERT) 25 MG tablet Take 25 mg by mouth 3 (three) times daily as needed for dizziness. 08/23/20 09/22/20  [provider]  potassium chloride SA  (KLOR-CON) 20 MEQ tablet Take 1 tablet (20 mEq total) by mouth daily. 08/22/20   Laurey Morale, MD  sacubitril-valsartan [facility]) [phone] MG Take 0.5 tablets twice daily 08/29/20   Laurey Morale, MD  sertraline (ZOLOFT) 25 MG tablet Take 1 tablet (25 mg total) by mouth daily. 08/22/20 08/22/21  Laurey Morale, MD    Allergies    Patient has no known allergies.  Review of Systems   Review of Systems  All other systems reviewed and are negative.   Physical Exam Updated Vital Signs BP 108/75 (BP Location: Right Arm)   Pulse 91   Temp 98.4 F (36.9 C) (Oral)   Resp 18   Ht 5\' 9"  (1.753 m)   Wt 53.1 kg   SpO2 100%   BMI 17.28 kg/m   Physical Exam Vitals and nursing note reviewed.  Constitutional:      General: She is not in acute distress.    Appearance: She is well-developed and well-nourished. She is not ill-appearing, toxic-appearing or diaphoretic.  HENT:     Head: Normocephalic and atraumatic.     Right Ear: External ear normal.     Left Ear: External ear normal.  Eyes:     Extraocular Movements: EOM normal.     Conjunctiva/sclera: Conjunctivae normal.     Pupils: Pupils are equal, round, and reactive to light.  Neck:     Trachea: Phonation normal.  Cardiovascular:     Rate and Rhythm: Normal rate and regular rhythm.     Heart sounds: Normal heart sounds.  Pulmonary:     Effort: Pulmonary effort is normal.     Breath sounds: Normal breath sounds.  Chest:     Chest wall: No bony tenderness.  Abdominal:     Palpations: Abdomen is soft.     Tenderness: There is no abdominal tenderness.  Musculoskeletal:        General: Normal range of motion.     Cervical back: Normal range of motion and neck supple.  Skin:    General: Skin is warm, dry and intact.  Neurological:     Mental Status: She is alert and oriented to person, place, and time.     Cranial Nerves: No cranial nerve deficit.     Sensory: No sensory deficit.     Motor: No abnormal muscle tone.      Coordination: Coordination normal.  Psychiatric:        Mood and Affect: Mood and affect normal.        Behavior: Behavior normal.        Thought Content: Thought content normal.  Judgment: Judgment normal.     ED Results / Procedures / Treatments   Labs (all labs ordered are listed, but only abnormal results are displayed) Labs Reviewed  BASIC METABOLIC PANEL  CBC  URINALYSIS, ROUTINE W REFLEX MICROSCOPIC  CBG MONITORING, ED  POC URINE PREG, ED    EKG None  Radiology No results found.  Procedures Procedures   Medications Ordered in ED Medications - No data to display  ED Course  I have reviewed the triage vital signs and the nursing notes.  Pertinent labs & imaging results that were available during my care of the patient were reviewed by me and considered in my medical decision making (see chart for details).    MDM Rules/Calculators/A&P                           Patient Vitals for the past 24 hrs:  BP Temp Temp src Pulse Resp SpO2 Height Weight  09/02/20 2230 112/76 -- -- 79 19 100 % -- --  09/02/20 2200 116/74 -- -- 79 18 98 % -- --  09/02/20 2055 -- -- -- -- -- 100 % -- --  09/02/20 2053 -- -- -- -- -- -- 5\' 9"  (1.753 m) 53.1 kg  09/02/20 2052 108/75 98.4 F (36.9 C) Oral 91 18 100 % -- --    11:07 PM Reevaluation with update and discussion. After initial assessment and treatment, an updated evaluation reveals she is comfortable has no further complaints, findings discussed and questions answered. 2053   Medical Decision Making:  This patient is presenting for evaluation of new syncope and fatigue, which does require a range of treatment options, and is a complaint that involves a moderate risk of morbidity and mortality. The differential diagnoses include volume depletion, worsening cardiac status, metabolic disorder. I decided to review old records, and in summary middle-aged female with severe congestive heart failure, recently had  medications adjusted, presenting with signs and symptoms of low blood pressure, with near syncope.  I did not require additional historical information from anyone.  Clinical Laboratory Tests Ordered, included CBC, Metabolic panel, Urinalysis and Urine drug screen. Review indicates UDS positive for benzodiazepine, creatinine slightly elevated, CBC with hemoglobin slightly low, urinalysis no apparent infection.   Critical Interventions-clinical evaluation, laboratory testing, orthostatic check, observation reassessment  After These Interventions, the Patient was reevaluated and was found stable for discharge.  Orthostatics negative.  Screening blood work normal.  Vital sign surveillance reveals normal and stable vital signs.  No indication for further ED evaluation or hospitalization at this time.  CRITICAL CARE-no Performed by: Mancel Bale  Nursing Notes Reviewed/ Care Coordinated Applicable Imaging Reviewed Interpretation of Laboratory Data incorporated into ED treatment  The patient appears reasonably screened and/or stabilized for discharge and I doubt any other medical condition or other [facility] requiring further screening, evaluation, or treatment in the ED at this time prior to discharge.  Plan: Home Medications-continue usual medicines; Home Treatments-rest, fluids; return here if the recommended treatment, does not improve the symptoms; Recommended follow up-PCP, and cardiologist, [place]     Final Clinical Impression(s) / ED Diagnoses Final diagnoses:  Near syncope    Rx / DC Orders ED Discharge Orders    None       [facility], MD 09/02/20 2318

## 2020-09-02 NOTE — Discharge Instructions (Addendum)
The test show that your blood pressure, and heart function are good at this time.  If you feel dizzy or weak, sit down and lie down.  Continue taking your medicine as currently prescribed.  Make sure you are drinking plenty of fluids and eating 3 good meals every day.  See your heart doctor if you continue to have problems

## 2020-09-02 NOTE — ED Triage Notes (Signed)
Pt was sent from UC for near syncopal episode, pt states she was laying in bed and had heart palpitations and felt dizzy, but denies LOC.  Seen at UC, B/P elevated, sent her for further evaluation.  Pt denies pain or discomfort, VSS.

## 2020-09-02 NOTE — ED Triage Notes (Signed)
Pt states she feels like she is having palpitations and feels like she is going to pass out.  Pt has hx of the same

## 2020-09-02 NOTE — Telephone Encounter (Signed)
Transition Care Management Follow-up Telephone Call  Date of discharge and from where: 08/30/2020 - [facility]  How have you been since you were released from the hospital? "Doing better"  Any questions or concerns? No  Items Reviewed:  Did the pt receive and understand the discharge instructions provided? Yes   Medications obtained and verified? Yes   Other? No   Any new allergies since your discharge? No   Dietary orders reviewed? Yes  Do you have support at home? Yes   Home Care and Equipment/Supplies: Were home health services ordered? not applicable If so, what is the name of the agency? N/A  Has the agency set up a time to come to the patient's home? not applicable Were any new equipment or medical supplies ordered?  No What is the name of the medical supply agency? N/A Were you able to get the supplies/equipment? not applicable Do you have any questions related to the use of the equipment or supplies? No  Functional Questionnaire: (I = Independent and D = Dependent) ADLs: I  Bathing/Dressing- I  Meal Prep- I  Eating- I  Maintaining continence- I  Transferring/Ambulation- I  Managing Meds- I  Follow up appointments reviewed:   PCP Hospital f/u appt confirmed? No    Specialist Hospital f/u appt confirmed? Yes  Scheduled to see Cardiology in May for a routine appointment.  Are transportation arrangements needed? No   If their condition worsens, is the pt aware to call PCP or go to the Emergency Dept.? Yes  Was the patient provided with contact information for the PCP's office or ED? Yes  Was to pt encouraged to call back with questions or concerns? Yes

## 2020-09-02 NOTE — ED Notes (Signed)
Patient is being discharged from the Urgent Care and sent to the Emergency Department via pov. Per K.Avegno, patient is in need of higher level of care due to palpitations . Patient is aware and verbalizes understanding of plan of care.  Vitals:   09/02/20 1835  BP: 112/76  Pulse: 96  Resp: 16  Temp: 97.6 F (36.4 C)  SpO2: 99%

## 2020-09-03 ENCOUNTER — Telehealth (HOSPITAL_COMMUNITY): Payer: Self-pay

## 2020-09-03 ENCOUNTER — Telehealth: Payer: Self-pay

## 2020-09-03 NOTE — Telephone Encounter (Signed)
Transition Care Management Follow-up Telephone Call  Date of discharge and from where: 09/02/2020 from [facility]  How have you been since you were released from the hospital? Pt stated that she is feeling some better today. Pt still has sx of DOE but is not really able to sit up due to feeling syncope. Pt stated that currently she needs assistance with all ADL's but has plenty of support at home.   Any questions or concerns? No  Items Reviewed:  Did the pt receive and understand the discharge instructions provided? Yes   Medications obtained and verified? Yes   Other? No   Any new allergies since your discharge? No   Dietary orders reviewed? n/a  Do you have support at home? Yes   Functional Questionnaire: (I = Independent and D = Dependent) ADLs: D  Bathing/Dressing- D  Meal Prep- D  Eating- I  Maintaining continence- I  Transferring/Ambulation- D  Managing Meds- I  Follow up appointments reviewed:   PCP Hospital f/u appt confirmed? Yes  Scheduled to see Hoy Register, MD on 09/15/2020 @ 10:50am.  Are transportation arrangements needed? No   If their condition worsens, is the pt aware to call PCP or go to the Emergency Dept.? Yes  Was the patient provided with contact information for the PCP's office or ED? Yes  Was to pt encouraged to call back with questions or concerns? Yes

## 2020-09-03 NOTE — Telephone Encounter (Signed)
Make sure she is off amlodipine.  Decrease Entresto to 24/26 bid.

## 2020-09-03 NOTE — Telephone Encounter (Signed)
Received multiple texts today from [facility] reporting she is dizzy and light headed. She stated she went to ER yesterday after being seen at Urgent Care for near syncope. Tina Fletcher reports an automatic blood pressure reading of 106/63. She says she is drinking up to 5 bottles of water and she only ate a small portion of food. I encouraged her to increase her meals and continue to hydrate and if she feels as if she is dizzy to lay in the trendelenburg position. I went out Monday for home visit and verified and filled a pill box. She is taking medications as prescribed. Please advise on what we should do for her. Thanks!   Tina Fletcher [employer]  [phone]

## 2020-09-04 ENCOUNTER — Other Ambulatory Visit (HOSPITAL_COMMUNITY): Payer: Self-pay | Admitting: *Deleted

## 2020-09-04 MED ORDER — ENTRESTO 24-26 MG PO TABS: 1.0000 | ORAL_TABLET | Freq: Two times a day (BID) | ORAL | 3 refills | Status: DC

## 2020-09-04 NOTE — Telephone Encounter (Signed)
Pt is off amlodipine. New rx for entresto 24/26mg  sent to pharmacy.

## 2020-09-06 ENCOUNTER — Emergency Department (HOSPITAL_COMMUNITY): Payer: Medicaid Other

## 2020-09-06 ENCOUNTER — Other Ambulatory Visit: Payer: Self-pay

## 2020-09-06 ENCOUNTER — Telehealth: Payer: Self-pay | Admitting: Student in an Organized Health Care Education/Training Program

## 2020-09-06 ENCOUNTER — Emergency Department (HOSPITAL_COMMUNITY)
Admission: EM | Admit: 2020-09-06 | Discharge: 2020-09-06 | Disposition: A | Discharge status: Home / Self Care | Payer: Medicaid Other | Attending: Emergency Medicine | Admitting: Emergency Medicine

## 2020-09-06 ENCOUNTER — Encounter (HOSPITAL_COMMUNITY): Payer: Self-pay | Admitting: Emergency Medicine

## 2020-09-06 DIAGNOSIS — R110 Nausea: Secondary | ICD-10-CM | POA: Insufficient documentation

## 2020-09-06 DIAGNOSIS — I110 Hypertensive heart disease with heart failure: Secondary | ICD-10-CM | POA: Diagnosis not present

## 2020-09-06 DIAGNOSIS — R0789 Other chest pain: Secondary | ICD-10-CM | POA: Diagnosis not present

## 2020-09-06 DIAGNOSIS — R42 Dizziness and giddiness: Secondary | ICD-10-CM | POA: Insufficient documentation

## 2020-09-06 DIAGNOSIS — R002 Palpitations: Secondary | ICD-10-CM | POA: Diagnosis not present

## 2020-09-06 DIAGNOSIS — R0602 Shortness of breath: Secondary | ICD-10-CM | POA: Insufficient documentation

## 2020-09-06 DIAGNOSIS — I5022 Chronic systolic (congestive) heart failure: Secondary | ICD-10-CM | POA: Diagnosis not present

## 2020-09-06 DIAGNOSIS — Z79899 Other long term (current) drug therapy: Secondary | ICD-10-CM | POA: Insufficient documentation

## 2020-09-06 DIAGNOSIS — D649 Anemia, unspecified: Secondary | ICD-10-CM | POA: Insufficient documentation

## 2020-09-06 LAB — CBC WITH DIFFERENTIAL/PLATELET
Abs Immature Granulocytes: 0.01 10*3/uL (ref 0.00–0.07)
Basophils Absolute: 0 10*3/uL (ref 0.0–0.1)
Basophils Relative: 1 %
Eosinophils Absolute: 0.1 10*3/uL (ref 0.0–0.5)
Eosinophils Relative: 2 %
HCT: 33.2 % — ABNORMAL LOW (ref 36.0–46.0)
Hemoglobin: 10.5 g/dL — ABNORMAL LOW (ref 12.0–15.0)
Immature Granulocytes: 0 %
Lymphocytes Relative: 30 %
Lymphs Abs: 1.3 10*3/uL (ref 0.7–4.0)
MCH: 28.8 pg (ref 26.0–34.0)
MCHC: 31.6 g/dL (ref 30.0–36.0)
MCV: 91.2 fL (ref 80.0–100.0)
Monocytes Absolute: 0.5 10*3/uL (ref 0.1–1.0)
Monocytes Relative: 10 %
Neutro Abs: 2.5 10*3/uL (ref 1.7–7.7)
Neutrophils Relative %: 57 %
Platelets: 155 10*3/uL (ref 150–400)
RBC: 3.64 MIL/uL — ABNORMAL LOW (ref 3.87–5.11)
RDW: 12.5 % (ref 11.5–15.5)
WBC: 4.4 10*3/uL (ref 4.0–10.5)
nRBC: 0 % (ref 0.0–0.2)

## 2020-09-06 LAB — I-STAT BETA HCG BLOOD, ED (MC, WL, AP ONLY): I-stat hCG, quantitative: <5 m[IU]/mL (ref <5)

## 2020-09-06 LAB — BASIC METABOLIC PANEL
Anion gap: 7 (ref 5–15)
BUN: 15 mg/dL (ref 6–20)
CO2: 26 mmol/L (ref 22–32)
Calcium: 8.9 mg/dL (ref 8.9–10.3)
Chloride: 102 mmol/L (ref 98–111)
Creatinine, Ser: 0.87 mg/dL (ref 0.44–1.00)
GFR, Estimated: >60 mL/min (ref >60)
Glucose, Bld: 89 mg/dL (ref 70–99)
Potassium: 3.3 mmol/L — ABNORMAL LOW (ref 3.5–5.1)
Sodium: 135 mmol/L (ref 135–145)

## 2020-09-06 LAB — TROPONIN I (HIGH SENSITIVITY)
Troponin I (High Sensitivity): 2 ng/L (ref <18)
Troponin I (High Sensitivity): 2 ng/L (ref <18)

## 2020-09-06 MED ORDER — SODIUM CHLORIDE 0.9 % IV BOLUS
500.0000 mL | Freq: Once | INTRAVENOUS | Status: AC
  Administered 2020-09-06: 500 mL via INTRAVENOUS

## 2020-09-06 NOTE — Discharge Instructions (Addendum)
There is no evidence of heart attack or arrhythmia tonight.  Keep yourself hydrated and take your medications as prescribed.  Follow-up with your cardiologist next week.  Return to the ED with exertional chest pain, shortness of breath, any other concerns.

## 2020-09-06 NOTE — ED Triage Notes (Signed)
Pt here for HTN and chest pain tonight. Pt recently here for the same.

## 2020-09-06 NOTE — Telephone Encounter (Signed)
Patient complains that she started having heart "fluttering" around 2 hours ago. Similar last night around 0100 for which she presented to the ED. Feels that she gets lightheaded. She has passed out in the past from similar. Says she has passed out 1 week ago but not today. Has been in bed most of the day. The lightheadedness happens only when she has palpitations. Currently she feels back to normal self. ECG/hsT ruled out this morning. Chest pressure started 08/17/20 when she first passed out. Always is there and feels that she is going to faint. BP 104/77, HR 91 . Took all of her prescribed meds today. Followed by Dr. Shirlee Latch for HFrEF. Off of amlodipine currently. Reduced entresto to 24/26 mg bid, took both doses already (1800 tonight). No etoh intake since 08/16/20. Recommended d/c of entresto currently and will arrange f/u with Dr. Shirlee Latch. Explained that if worsening sx to call 911 and be evaluated in ED.

## 2020-09-06 NOTE — Telephone Encounter (Signed)
D/c entresto on meds

## 2020-09-06 NOTE — ED Provider Notes (Signed)
[facility] EMERGENCY DEPARTMENT Provider Note   CSN: [id number] Arrival date & time: 09/06/20  0116     History Chief Complaint  Patient presents with  . Chest Pain    Tina Fletcher is a 35 y.o. female.  Patient with history of nonischemic cardiomyopathy, prolonged QT, hypertension presenting with episode of chest pain that onset while she was lasting in bed tonight.  She describes a sensation of burning pressure in the center of her chest with a sensation of fluttering.  This lasted for a few seconds and has since resolved.  It was associated with some nausea.  No vomiting.  No difficulty breathing.  No cough or fever.  She has had this pain multiple times in the past with previous evaluations for same.  She states her cardiologist is supposed to give her Holter monitor to wear in the near future.  She has had multiple recent ED visits for near syncope and hypotension.  Her blood pressure medications were recently adjusted.  She states she was told to stop everything.  Per Dr. Alford Highland notes however her Entresto dose was reduced and she was told to stop amlodipine and spironolactone only. She is not certain what she is taking but states she did pick up the new Entresto prescription. She reports no alcohol use since February 19.  She feels back to baseline now.  She admits she may have not had enough to drink today.  There is been no vomiting or diarrhea.  She denies any alcohol or drug use. No focal weakness, numbness or tingling.  No visual changes or spinning sensation.  No headache  The history is provided by the patient.  Chest Pain Associated symptoms: dizziness, nausea, palpitations and shortness of breath   Associated symptoms: no abdominal pain, no fever, no headache, no vomiting and no weakness        Past Medical History:  Diagnosis Date  . Abnormal menstrual periods 02/12/2014  . CHF (congestive heart failure) (HCC)   . Herpes simplex without mention of  complication   . Hypertension   . Medical history non-contributory   . Migraines   . Nexplanon insertion 08/13/2013   08/13/13 inserted left arm, remove 08/13/16  . Vitamin D deficiency 04/01/2016    Patient Active Problem List   Diagnosis Date Noted  . Hypertensive crisis 08/18/2020  . Hypokalemia 08/18/2020  . Prolonged QT interval 08/18/2020  . Syncope   . Chronic systolic CHF (congestive heart failure) (HCC)   . Cervical cancer screening 10/23/2019  . Systolic CHF, acute (HCC) 05/10/2019  . Vitamin D deficiency 04/01/2016  . Nexplanon insertion 08/13/2013  . Thrombocytopenia (HCC) 04/17/2013  . 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 Underweight 01/08/2013    Past Surgical History:  Procedure Laterality Date  . RIGHT/LEFT HEART CATH AND CORONARY ANGIOGRAPHY N/A 05/14/2019   Procedure: RIGHT/LEFT HEART CATH AND CORONARY ANGIOGRAPHY;  Surgeon: Laurey Morale, MD;  Location: [facility] INVASIVE CV LAB;  Service: Cardiovascular;  Laterality: N/A;     OB History    Gravida  2   Para  2   Term  2   Preterm      AB      Living  2     SAB      IAB      Ectopic      Multiple      Live Births  2           Family History  Problem Relation Age of Onset  .  Diabetes Maternal Grandmother   . Cancer Paternal Grandfather        prancreatic  . COPD Maternal Grandfather   . Cancer Maternal Grandfather        prostate  . Asthma Maternal Grandfather   . Asthma Son   . Bronchitis Son     Social History   Tobacco Use  . Smoking status: Never Smoker  . Smokeless tobacco: Never Used  Vaping Use  . Vaping Use: Never used  Substance Use Topics  . Alcohol use: Yes    Comment: occ  . Drug use: No    Home Medications Prior to Admission medications   Medication Sig Start Date End Date Taking? Authorizing Provider  acetaminophen (TYLENOL) 325 MG tablet Take 650 mg by mouth every 6 (six) hours as needed for moderate pain.    [provider]  amLODipine (NORVASC) 5  MG tablet Take 1 tablet (5 mg total) by mouth daily. Only take if SBP greater than 140 08/26/20 08/26/21  Simmons, Avon Gully M, PA-C  carvedilol (COREG) 3.125 MG tablet Take 1 tablet (3.125 mg total) by mouth 2 (two) times daily with a meal. Patient not taking: Reported on 09/01/2020 08/26/20   Robbie Lis M, PA-C  chlordiazePOXIDE (LIBRIUM) 25 MG capsule Day 1, please take 2 tablets for breakfast, lunch, and dinner. Day 2, please take 1-2 tablets for breakfast and dinner. Day 3 take 1-2 tablets for breakfast only. Take the minimum dose required to alleviate symptoms. 08/24/20   Gerhard Munch, MD  etonogestrel (NEXPLANON) 68 MG IMPL implant Inject 1 each into the skin continuous.    [provider]  ivabradine (CORLANOR) 5 MG TABS tablet Take 0.5 tablets (2.5 mg total) by mouth 2 (two) times daily with a meal. 07/01/20   Laurey Morale, MD  meclizine (ANTIVERT) 25 MG tablet Take 25 mg by mouth 3 (three) times daily as needed for dizziness. 08/23/20 09/22/20  [provider]  potassium chloride SA (KLOR-CON) 20 MEQ tablet Take 1 tablet (20 mEq total) by mouth daily. 08/22/20   Laurey Morale, MD  sacubitril-valsartan (ENTRESTO) 24-26 MG Take 1 tablet by mouth 2 (two) times daily. 09/04/20   Laurey Morale, MD  sertraline (ZOLOFT) 25 MG tablet Take 1 tablet (25 mg total) by mouth daily. 08/22/20 08/22/21  Laurey Morale, MD    Allergies    Patient has no known allergies.  Review of Systems   Review of Systems  Constitutional: Negative for activity change, appetite change and fever.  HENT: Negative for congestion and rhinorrhea.   Respiratory: Positive for chest tightness and shortness of breath.   Cardiovascular: Positive for chest pain and palpitations. Negative for leg swelling.  Gastrointestinal: Positive for nausea. Negative for abdominal pain and vomiting.  Genitourinary: Negative for dysuria and hematuria.  Musculoskeletal: Negative for arthralgias and myalgias.  Skin:  Negative for rash.  Neurological: Positive for dizziness and light-headedness. Negative for weakness and headaches.   all other systems are negative except as noted in the HPI and PMH.   Physical Exam Updated Vital Signs BP 118/83 (BP Location: Right Arm)   Pulse 74   Temp 97.7 F (36.5 C) (Oral)   Resp 16   Ht 5\' 9"  (1.753 m)   Wt 53.1 kg   SpO2 100%   BMI 17.28 kg/m   Physical Exam Vitals and nursing note reviewed.  Constitutional:      General: She is not in acute distress.    Appearance: She is  well-developed.  HENT:     Head: Normocephalic and atraumatic.     Mouth/Throat:     Pharynx: No oropharyngeal exudate.  Eyes:     Conjunctiva/sclera: Conjunctivae normal.     Pupils: Pupils are equal, round, and reactive to light.  Neck:     Comments: No meningismus. Cardiovascular:     Rate and Rhythm: Normal rate and regular rhythm.     Heart sounds: Normal heart sounds. No murmur heard.   Pulmonary:     Effort: Pulmonary effort is normal. No respiratory distress.     Breath sounds: Normal breath sounds.  Abdominal:     Palpations: Abdomen is soft.     Tenderness: There is no abdominal tenderness. There is no guarding or rebound.  Musculoskeletal:        General: No tenderness. Normal range of motion.     Cervical back: Normal range of motion and neck supple.  Skin:    General: Skin is warm.  Neurological:     Mental Status: She is alert and oriented to person, place, and time.     Cranial Nerves: No cranial nerve deficit.     Motor: No abnormal muscle tone.     Coordination: Coordination normal.     Comments:  5/5 strength throughout. CN 2-12 intact.Equal grip strength.   Psychiatric:        Behavior: Behavior normal.     ED Results / Procedures / Treatments   Labs (all labs ordered are listed, but only abnormal results are displayed) Labs Reviewed  CBC WITH DIFFERENTIAL/PLATELET - Abnormal; Notable for the following components:      Result Value   RBC  3.64 (*)    Hemoglobin 10.5 (*)    HCT 33.2 (*)    All other components within normal limits  BASIC METABOLIC PANEL - Abnormal; Notable for the following components:   Potassium 3.3 (*)    All other components within normal limits  I-STAT BETA HCG BLOOD, ED ([facility], [facility], [facility] ONLY)  TROPONIN I (HIGH SENSITIVITY)  TROPONIN I (HIGH SENSITIVITY)    EKG EKG Interpretation  Date/Time:  Saturday September 06 2020 01:39:23 EST Ventricular Rate:  78 PR Interval:    QRS Duration: 75 QT Interval:  389 QTC Calculation: 444 R Axis:   57 Text Interpretation: Sinus rhythm Minimal ST depression, inferior leads Borderline ST elevation, lateral leads No significant change was found Confirmed by Glynn Octave [phone]) on 09/06/2020 1:55:09 AM   Radiology DG Chest Portable 1 View  Result Date: 09/06/2020 CLINICAL DATA:  Hypertension, chest pain EXAM: PORTABLE CHEST 1 VIEW COMPARISON:  08/24/2020 FINDINGS: The heart size and mediastinal contours are within normal limits. Both lungs are clear. The visualized skeletal structures are unremarkable. IMPRESSION: No active disease. Electronically Signed   By: Sharlet Salina M.D.   On: 09/06/2020 01:54    Procedures Procedures   Medications Ordered in ED Medications - No data to display  ED Course  I have reviewed the triage vital signs and the nursing notes.  Pertinent labs & imaging results that were available during my care of the patient were reviewed by me and considered in my medical decision making (see chart for details).    MDM Rules/Calculators/A&P                         Brief episode of chest pain with palpitations, now resolved.  EKG is sinus rhythm without acute ST changes. Chest pain is atypical  for ACS.  No known coronary disease on last catheterization.  Low suspicion for pulmonary embolism or aortic dissection.  Patient with recent adjustment of her blood pressure medication due to near syncope. No known coronary disease on last  catheterization.  EF is 40 to 45%  10 point drop in systolic blood pressure with standing.  Gentle hydration given.  Symptoms have resolved.  Patient feels back to baseline.  Troponin is negative x2.  Labs are reassuring with stable anemia.  Stressed compliance with her home medications and follow-up with cardiology for Holter monitoring. Increase oral hydration at home. Return to the ED with exertional chest pain, shortness of breath, nausea, vomiting, sweating or any other concerns. Final Clinical Impression(s) / ED Diagnoses Final diagnoses:  Atypical chest pain    Rx / DC Orders ED Discharge Orders    None       Rancour, Jeannett Senior, MD 09/06/20 [phone]

## 2020-09-08 ENCOUNTER — Emergency Department (HOSPITAL_COMMUNITY)
Admission: EM | Admit: 2020-09-08 | Discharge: 2020-09-08 | Disposition: A | Discharge status: Home / Self Care | Payer: Medicaid Other | Attending: Emergency Medicine | Admitting: Emergency Medicine

## 2020-09-08 ENCOUNTER — Telehealth (HOSPITAL_COMMUNITY): Payer: Self-pay

## 2020-09-08 ENCOUNTER — Other Ambulatory Visit: Payer: Self-pay

## 2020-09-08 ENCOUNTER — Telehealth: Payer: Self-pay

## 2020-09-08 ENCOUNTER — Encounter (HOSPITAL_COMMUNITY): Payer: Self-pay | Admitting: *Deleted

## 2020-09-08 DIAGNOSIS — F419 Anxiety disorder, unspecified: Secondary | ICD-10-CM | POA: Insufficient documentation

## 2020-09-08 DIAGNOSIS — Z79899 Other long term (current) drug therapy: Secondary | ICD-10-CM | POA: Diagnosis not present

## 2020-09-08 DIAGNOSIS — R079 Chest pain, unspecified: Secondary | ICD-10-CM | POA: Diagnosis not present

## 2020-09-08 DIAGNOSIS — Z955 Presence of coronary angioplasty implant and graft: Secondary | ICD-10-CM | POA: Diagnosis not present

## 2020-09-08 DIAGNOSIS — I5021 Acute systolic (congestive) heart failure: Secondary | ICD-10-CM | POA: Insufficient documentation

## 2020-09-08 DIAGNOSIS — I110 Hypertensive heart disease with heart failure: Secondary | ICD-10-CM | POA: Insufficient documentation

## 2020-09-08 LAB — BASIC METABOLIC PANEL
Anion gap: 10 (ref 5–15)
BUN: 10 mg/dL (ref 6–20)
CO2: 23 mmol/L (ref 22–32)
Calcium: 9.1 mg/dL (ref 8.9–10.3)
Chloride: 101 mmol/L (ref 98–111)
Creatinine, Ser: 0.69 mg/dL (ref 0.44–1.00)
GFR, Estimated: >60 mL/min (ref >60)
Glucose, Bld: 82 mg/dL (ref 70–99)
Potassium: 4 mmol/L (ref 3.5–5.1)
Sodium: 134 mmol/L — ABNORMAL LOW (ref 135–145)

## 2020-09-08 LAB — CBC
HCT: 35.7 % — ABNORMAL LOW (ref 36.0–46.0)
Hemoglobin: 11.8 g/dL — ABNORMAL LOW (ref 12.0–15.0)
MCH: 29.4 pg (ref 26.0–34.0)
MCHC: 33.1 g/dL (ref 30.0–36.0)
MCV: 89 fL (ref 80.0–100.0)
Platelets: 155 10*3/uL (ref 150–400)
RBC: 4.01 MIL/uL (ref 3.87–5.11)
RDW: 12.4 % (ref 11.5–15.5)
WBC: 4.9 10*3/uL (ref 4.0–10.5)
nRBC: 0 % (ref 0.0–0.2)

## 2020-09-08 NOTE — Telephone Encounter (Signed)
Spoke to [place] today who reports she has been having chest pain, however she reports she was checked out by EMS and an EKG was obtained and was normal they gave her 4- 81mg  ASA and she refused transport at that time. Faven reports her vitals were normal with BP at 128/85. I called HF clinic triage and PA reports she needs to follow up with PCP which is scheduled for 3/21 with Dr. 4/21 as Alvis Lemmings has been to ED multiple times for same complaints. Marily Lente PA also approved increase of Zoloft to 25mg  BID to assist with her anxiety. I also reminded Sala to stop Entresto as ordered by Dr. Boyce Medici. I reported all of these things to [facility] and she agreed with same and said she felt better with no chest pain at 1400 and says she thinks she will be fine. I will continue to assist and follow up. I will see her at PCP visit next week. Call complete.

## 2020-09-08 NOTE — ED Provider Notes (Signed)
[facility] EMERGENCY DEPARTMENT Provider Note   CSN: [id number] Arrival date & time: 09/08/20  1643     History Chief Complaint  Patient presents with  . Chest Pain    Tina Fletcher is a 35 y.o. female.  HPI Patient presents with chest pain.  States she will feel her heart race and then get chest pain.  Has been getting more frequent.  Has a history of heart failure.  Ejection fraction per patient she states had been less than 5% but most recently up to 40 to 50%.  This is her 12th visit in the last month to the ER.  Patient was a heavy drinker reportedly has quit alcohol for around a month now.  States she had EMS come to her house and her EKG was fine earlier today.  Patient states cardiology has been adjusting her medicines.  She has been off Entresto for 2 days now.  Patient wonders if anxiety could be part of this.  States she took an anxiety medicine and it helped.    Past Medical History:  Diagnosis Date  . Abnormal menstrual periods 02/12/2014  . CHF (congestive heart failure) (HCC)   . Herpes simplex without mention of complication   . Hypertension   . Medical history non-contributory   . Migraines   . Nexplanon insertion 08/13/2013   08/13/13 inserted left arm, remove 08/13/16  . Vitamin D deficiency 04/01/2016    Patient Active Problem List   Diagnosis Date Noted  . Hypertensive crisis 08/18/2020  . Hypokalemia 08/18/2020  . Prolonged QT interval 08/18/2020  . Syncope   . Chronic systolic CHF (congestive heart failure) (HCC)   . Cervical cancer screening 10/23/2019  . Systolic CHF, acute (HCC) 05/10/2019  . Vitamin D deficiency 04/01/2016  . Nexplanon insertion 08/13/2013  . Thrombocytopenia (HCC) 04/17/2013  . 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 Underweight 01/08/2013    Past Surgical History:  Procedure Laterality Date  . RIGHT/LEFT HEART CATH AND CORONARY ANGIOGRAPHY N/A 05/14/2019   Procedure: RIGHT/LEFT HEART CATH AND CORONARY ANGIOGRAPHY;  Surgeon:  Laurey Morale, MD;  Location: [facility] INVASIVE CV LAB;  Service: Cardiovascular;  Laterality: N/A;     OB History    Gravida  2   Para  2   Term  2   Preterm      AB      Living  2     SAB      IAB      Ectopic      Multiple      Live Births  2           Family History  Problem Relation Age of Onset  . Diabetes Maternal Grandmother   . Cancer Paternal Grandfather        prancreatic  . COPD Maternal Grandfather   . Cancer Maternal Grandfather        prostate  . Asthma Maternal Grandfather   . Asthma Son   . Bronchitis Son     Social History   Tobacco Use  . Smoking status: Never Smoker  . Smokeless tobacco: Never Used  Vaping Use  . Vaping Use: Never used  Substance Use Topics  . Alcohol use: Yes    Comment: occ  . Drug use: No    Home Medications Prior to Admission medications   Medication Sig Start Date End Date Taking? Authorizing Provider  acetaminophen (TYLENOL) 325 MG tablet Take 650 mg by mouth every 6 (six) hours as needed  for moderate pain.   Yes [provider]  amLODipine (NORVASC) 5 MG tablet Take 1 tablet (5 mg total) by mouth daily. Only take if SBP greater than 140 08/26/20 08/26/21 Yes Simmons, Brittainy M, PA-C  carvedilol (COREG) 3.125 MG tablet Take 1 tablet (3.125 mg total) by mouth 2 (two) times daily with a meal. 08/26/20  Yes Robbie Lis M, PA-C  chlordiazePOXIDE (LIBRIUM) 25 MG capsule Day 1, please take 2 tablets for breakfast, lunch, and dinner. Day 2, please take 1-2 tablets for breakfast and dinner. Day 3 take 1-2 tablets for breakfast only. Take the minimum dose required to alleviate symptoms. 08/24/20  Yes Gerhard Munch, MD  etonogestrel (NEXPLANON) 68 MG IMPL implant Inject 1 each into the skin continuous.   Yes [provider]  ivabradine (CORLANOR) 5 MG TABS tablet Take 0.5 tablets (2.5 mg total) by mouth 2 (two) times daily with a meal. 07/01/20  Yes Laurey Morale, MD  meclizine (ANTIVERT) 25 MG  tablet Take 25 mg by mouth 3 (three) times daily as needed for dizziness. 08/23/20 09/22/20 Yes [provider]  potassium chloride SA (KLOR-CON) 20 MEQ tablet Take 1 tablet (20 mEq total) by mouth daily. 08/22/20  Yes Laurey Morale, MD  sertraline (ZOLOFT) 25 MG tablet Take 1 tablet (25 mg total) by mouth daily. 08/22/20 08/22/21 Yes Laurey Morale, MD    Allergies    Patient has no known allergies.  Review of Systems   Review of Systems  Constitutional: Negative for appetite change and fever.  HENT: Negative for congestion.   Respiratory: Positive for shortness of breath.   Cardiovascular: Positive for chest pain and palpitations.  Genitourinary: Negative for flank pain.  Musculoskeletal: Negative for back pain.  Skin: Negative for rash.  Neurological: Negative for weakness.  Psychiatric/Behavioral: The patient is nervous/anxious.     Physical Exam Updated Vital Signs BP 114/86   Pulse 80   Temp 97.7 F (36.5 C) (Oral)   Resp 16   Ht 5\' 9"  (1.753 m)   Wt 54.4 kg   SpO2 100%   BMI 17.72 kg/m   Physical Exam Vitals and nursing note reviewed.  HENT:     Head: Normocephalic.  Cardiovascular:     Rate and Rhythm: Normal rate and regular rhythm.  Pulmonary:     Breath sounds: No wheezing or rhonchi.  Chest:     Chest wall: No tenderness.  Abdominal:     Tenderness: There is no abdominal tenderness.  Musculoskeletal:     Right lower leg: No edema.     Left lower leg: No edema.  Skin:    General: Skin is warm.     Capillary Refill: Capillary refill takes less than 2 seconds.  Neurological:     Mental Status: She is alert and oriented to person, place, and time.     ED Results / Procedures / Treatments   Labs (all labs ordered are listed, but only abnormal results are displayed) Labs Reviewed  CBC - Abnormal; Notable for the following components:      Result Value   Hemoglobin 11.8 (*)    HCT 35.7 (*)    All other components within normal limits   BASIC METABOLIC PANEL - Abnormal; Notable for the following components:   Sodium 134 (*)    All other components within normal limits    EKG EKG Interpretation  Date/Time:  Monday September 08 2020 17:04:45 EDT Ventricular Rate:  90 PR Interval:  QRS Duration: 84 QT Interval:  387 QTC Calculation: 474 R Axis:   69 Text Interpretation: Sinus rhythm Baseline wander in lead(s) V6 Confirmed by Benjiman Core [phone]) on 09/08/2020 6:10:49 PM   Radiology No results found.  Procedures Procedures   Medications Ordered in ED Medications - No data to display  ED Course  I have reviewed the triage vital signs and the nursing notes.  Pertinent labs & imaging results that were available during my care of the patient were reviewed by me and considered in my medical decision making (see chart for details).    MDM Rules/Calculators/A&P                          Patient presents with chest pain.  Palpitations.  Reviewed cardiac monitoring.  No arrhythmia while here.  Patient states she did have episodes of feeling her heart race and those did not show up on the monitor.  I think there is likely a large anxiety component.  Does have known bad ejection fraction but that is improved to about 40% now.  Puts her at a lower risk of spontaneous lethal arrhythmia.  Reviewed lab work and had mild abnormalities on CBC and basic metabolic on recent testing.  Repeated and reassuring.  Doubt cardiac ischemia as a cause.  Can follow-up with cardiology.  Has had previous Holter monitoring without severe arrhythmia at that time.  Discharge home  Final Clinical Impression(s) / ED Diagnoses Final diagnoses:  Nonspecific chest pain  Anxiety    Rx / DC Orders ED Discharge Orders    None       Benjiman Core, MD 09/08/20 [id number]

## 2020-09-08 NOTE — ED Triage Notes (Signed)
Pt mid cp since 1200 today, had EMS sent and ekg done at home. Pt with intermittent cp since today.  Pt states hx of heart failure

## 2020-09-08 NOTE — Telephone Encounter (Signed)
Transition Care Management Unsuccessful Follow-up Telephone Call  Date of discharge and from where:  09/06/2020 from [facility]  Attempts:  1st Attempt  Reason for unsuccessful TCM follow-up call:  Unable to leave message

## 2020-09-09 ENCOUNTER — Other Ambulatory Visit: Payer: Self-pay

## 2020-09-09 DIAGNOSIS — I5022 Chronic systolic (congestive) heart failure: Secondary | ICD-10-CM | POA: Insufficient documentation

## 2020-09-09 DIAGNOSIS — I110 Hypertensive heart disease with heart failure: Secondary | ICD-10-CM | POA: Diagnosis not present

## 2020-09-09 DIAGNOSIS — R55 Syncope and collapse: Secondary | ICD-10-CM | POA: Insufficient documentation

## 2020-09-09 DIAGNOSIS — Z79899 Other long term (current) drug therapy: Secondary | ICD-10-CM | POA: Insufficient documentation

## 2020-09-09 DIAGNOSIS — R002 Palpitations: Secondary | ICD-10-CM | POA: Diagnosis not present

## 2020-09-09 NOTE — Telephone Encounter (Signed)
Transition Care Management Unsuccessful Follow-up Telephone Call  Date of discharge and from where:  09/06/2020 from [facility]  Attempts:  2nd Attempt  Reason for unsuccessful TCM follow-up call:  Unable to leave message

## 2020-09-10 ENCOUNTER — Emergency Department (HOSPITAL_COMMUNITY)
Admission: EM | Admit: 2020-09-10 | Discharge: 2020-09-10 | Disposition: A | Discharge status: Home / Self Care | Payer: Medicaid Other | Attending: Emergency Medicine | Admitting: Emergency Medicine

## 2020-09-10 ENCOUNTER — Other Ambulatory Visit: Payer: Self-pay

## 2020-09-10 ENCOUNTER — Encounter (HOSPITAL_COMMUNITY): Payer: Self-pay | Admitting: Emergency Medicine

## 2020-09-10 ENCOUNTER — Telehealth (HOSPITAL_COMMUNITY): Payer: Self-pay | Admitting: Licensed Clinical Social Worker

## 2020-09-10 DIAGNOSIS — R55 Syncope and collapse: Secondary | ICD-10-CM

## 2020-09-10 LAB — CBC WITH DIFFERENTIAL/PLATELET
Abs Immature Granulocytes: 0.01 10*3/uL (ref 0.00–0.07)
Basophils Absolute: 0 10*3/uL (ref 0.0–0.1)
Basophils Relative: 1 %
Eosinophils Absolute: 0.1 10*3/uL (ref 0.0–0.5)
Eosinophils Relative: 3 %
HCT: 31.3 % — ABNORMAL LOW (ref 36.0–46.0)
Hemoglobin: 10.3 g/dL — ABNORMAL LOW (ref 12.0–15.0)
Immature Granulocytes: 0 %
Lymphocytes Relative: 32 %
Lymphs Abs: 1.7 10*3/uL (ref 0.7–4.0)
MCH: 29.3 pg (ref 26.0–34.0)
MCHC: 32.9 g/dL (ref 30.0–36.0)
MCV: 88.9 fL (ref 80.0–100.0)
Monocytes Absolute: 0.5 10*3/uL (ref 0.1–1.0)
Monocytes Relative: 10 %
Neutro Abs: 2.8 10*3/uL (ref 1.7–7.7)
Neutrophils Relative %: 54 %
Platelets: 143 10*3/uL — ABNORMAL LOW (ref 150–400)
RBC: 3.52 MIL/uL — ABNORMAL LOW (ref 3.87–5.11)
RDW: 12.6 % (ref 11.5–15.5)
WBC: 5.3 10*3/uL (ref 4.0–10.5)
nRBC: 0 % (ref 0.0–0.2)

## 2020-09-10 LAB — URINALYSIS, ROUTINE W REFLEX MICROSCOPIC
Bilirubin Urine: NEGATIVE
Glucose, UA: NEGATIVE mg/dL
Hgb urine dipstick: NEGATIVE
Ketones, ur: NEGATIVE mg/dL
Nitrite: NEGATIVE
Protein, ur: NEGATIVE mg/dL
Specific Gravity, Urine: 1.019 (ref 1.005–1.030)
pH: 6 (ref 5.0–8.0)

## 2020-09-10 LAB — RAPID URINE DRUG SCREEN, HOSP PERFORMED
Amphetamines: NOT DETECTED
Barbiturates: NOT DETECTED
Benzodiazepines: POSITIVE — AB
Cocaine: NOT DETECTED
Opiates: NOT DETECTED
Tetrahydrocannabinol: NOT DETECTED

## 2020-09-10 LAB — ETHANOL: Alcohol, Ethyl (B): <10 mg/dL (ref <10)

## 2020-09-10 LAB — POC URINE PREG, ED: Preg Test, Ur: NEGATIVE

## 2020-09-10 LAB — BASIC METABOLIC PANEL
Anion gap: 8 (ref 5–15)
BUN: 12 mg/dL (ref 6–20)
CO2: 21 mmol/L — ABNORMAL LOW (ref 22–32)
Calcium: 9 mg/dL (ref 8.9–10.3)
Chloride: 106 mmol/L (ref 98–111)
Creatinine, Ser: 0.76 mg/dL (ref 0.44–1.00)
GFR, Estimated: >60 mL/min (ref >60)
Glucose, Bld: 98 mg/dL (ref 70–99)
Potassium: 3.8 mmol/L (ref 3.5–5.1)
Sodium: 135 mmol/L (ref 135–145)

## 2020-09-10 NOTE — Discharge Instructions (Signed)
You were seen today for another episode of near syncope.  Your work-up is reassuring.  Given the recurrence of these episodes, seizure is also a consideration.  You should not drive or operate heavy machinery until you are evaluated by her primary physician and neurology.

## 2020-09-10 NOTE — ED Provider Notes (Signed)
[facility] EMERGENCY DEPARTMENT Provider Note   CSN: [id number] Arrival date & time: 09/09/20  2352     History Chief Complaint  Patient presents with  . Loss of Consciousness    Tina Fletcher is a 35 y.o. female.  HPI     This is a 35 year old female with a history of heart failure, hypertension who presents with near syncope and palpitations.  Patient has had frequent ER visits in the last several months for the same.  Patient reports that she stopped drinking alcohol on February 19.  Since that time she has had multiple episodes of near syncope and palpitations with chest pain.  She has been seen and evaluated in the ER for the same.  Patient states that she has had some of her medications adjusted including her blood pressure medications.  She reports that today she was in the car when she had a weird taste in her mouth and began to see spots in her vision.  She felt like she was going to pass out.  She did not actually lose consciousness.  She had palpitations at that time.  No chest pain.  Patient reports that she has abstained completely from alcohol since February 19.  She denies any other illicit drug use.  Patient reports 1 prior isolated seizure.  Past Medical History:  Diagnosis Date  . Abnormal menstrual periods 02/12/2014  . CHF (congestive heart failure) (HCC)   . Herpes simplex without mention of complication   . Hypertension   . Medical history non-contributory   . Migraines   . Nexplanon insertion 08/13/2013   08/13/13 inserted left arm, remove 08/13/16  . Vitamin D deficiency 04/01/2016    Patient Active Problem List   Diagnosis Date Noted  . Hypertensive crisis 08/18/2020  . Hypokalemia 08/18/2020  . Prolonged QT interval 08/18/2020  . Syncope   . Chronic systolic CHF (congestive heart failure) (HCC)   . Cervical cancer screening 10/23/2019  . Systolic CHF, acute (HCC) 05/10/2019  . Vitamin D deficiency 04/01/2016  . Nexplanon insertion 08/13/2013   . Thrombocytopenia (HCC) 04/17/2013  . 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 Underweight 01/08/2013    Past Surgical History:  Procedure Laterality Date  . RIGHT/LEFT HEART CATH AND CORONARY ANGIOGRAPHY N/A 05/14/2019   Procedure: RIGHT/LEFT HEART CATH AND CORONARY ANGIOGRAPHY;  Surgeon: Laurey Morale, MD;  Location: [facility] INVASIVE CV LAB;  Service: Cardiovascular;  Laterality: N/A;     OB History    Gravida  2   Para  2   Term  2   Preterm      AB      Living  2     SAB      IAB      Ectopic      Multiple      Live Births  2           Family History  Problem Relation Age of Onset  . Diabetes Maternal Grandmother   . Cancer Paternal Grandfather        prancreatic  . COPD Maternal Grandfather   . Cancer Maternal Grandfather        prostate  . Asthma Maternal Grandfather   . Asthma Son   . Bronchitis Son     Social History   Tobacco Use  . Smoking status: Never Smoker  . Smokeless tobacco: Never Used  Vaping Use  . Vaping Use: Never used  Substance Use Topics  . Alcohol use: Yes  Comment: occ  . Drug use: No    Home Medications Prior to Admission medications   Medication Sig Start Date End Date Taking? Authorizing Provider  acetaminophen (TYLENOL) 325 MG tablet Take 650 mg by mouth every 6 (six) hours as needed for moderate pain.    [provider]  amLODipine (NORVASC) 5 MG tablet Take 1 tablet (5 mg total) by mouth daily. Only take if SBP greater than 140 08/26/20 08/26/21  Simmons, Avon Gully M, PA-C  carvedilol (COREG) 3.125 MG tablet Take 1 tablet (3.125 mg total) by mouth 2 (two) times daily with a meal. 08/26/20   Robbie Lis M, PA-C  chlordiazePOXIDE (LIBRIUM) 25 MG capsule Day 1, please take 2 tablets for breakfast, lunch, and dinner. Day 2, please take 1-2 tablets for breakfast and dinner. Day 3 take 1-2 tablets for breakfast only. Take the minimum dose required to alleviate symptoms. 08/24/20   Gerhard Munch, MD  etonogestrel  (NEXPLANON) 68 MG IMPL implant Inject 1 each into the skin continuous.    [provider]  ivabradine (CORLANOR) 5 MG TABS tablet Take 0.5 tablets (2.5 mg total) by mouth 2 (two) times daily with a meal. 07/01/20   Laurey Morale, MD  meclizine (ANTIVERT) 25 MG tablet Take 25 mg by mouth 3 (three) times daily as needed for dizziness. 08/23/20 09/22/20  [provider]  potassium chloride SA (KLOR-CON) 20 MEQ tablet Take 1 tablet (20 mEq total) by mouth daily. 08/22/20   Laurey Morale, MD  sertraline (ZOLOFT) 25 MG tablet Take 1 tablet (25 mg total) by mouth daily. 08/22/20 08/22/21  Laurey Morale, MD    Allergies    Patient has no known allergies.  Review of Systems   Review of Systems  Respiratory: Negative for chest tightness and shortness of breath.   Cardiovascular: Positive for palpitations.  Gastrointestinal: Negative for abdominal pain, nausea and vomiting.  Genitourinary: Negative for dysuria.  Neurological:       Near syncope  All other systems reviewed and are negative.   Physical Exam Updated Vital Signs BP 110/83   Pulse 73   Temp 98.7 F (37.1 C)   Resp 17   Ht 1.753 m (5\' 9" )   Wt 54.4 kg   SpO2 100%   BMI 17.72 kg/m   Physical Exam Vitals and nursing note reviewed.  Constitutional:      Appearance: She is well-developed. She is not ill-appearing.  HENT:     Head: Normocephalic and atraumatic.     Nose: Nose normal.     Mouth/Throat:     Mouth: Mucous membranes are moist.  Eyes:     Pupils: Pupils are equal, round, and reactive to light.  Cardiovascular:     Rate and Rhythm: Normal rate and regular rhythm.     Heart sounds: Normal heart sounds.  Pulmonary:     Effort: Pulmonary effort is normal. No respiratory distress.     Breath sounds: No wheezing.  Abdominal:     General: Bowel sounds are normal.     Palpations: Abdomen is soft.     Tenderness: There is no abdominal tenderness.  Musculoskeletal:     Cervical back: Neck  supple.     Right lower leg: No edema.     Left lower leg: No edema.  Skin:    General: Skin is warm and dry.  Neurological:     Mental Status: She is alert and oriented to person, place, and time.  Comments: Cranial nerves II through XII intact, 5 out of 5 strength in all 4 extremities, no dysmetria to finger-nose-finger  Psychiatric:        Mood and Affect: Mood normal.     ED Results / Procedures / Treatments   Labs (all labs ordered are listed, but only abnormal results are displayed) Labs Reviewed  CBC WITH DIFFERENTIAL/PLATELET - Abnormal; Notable for the following components:      Result Value   RBC 3.52 (*)    Hemoglobin 10.3 (*)    HCT 31.3 (*)    Platelets 143 (*)    All other components within normal limits  BASIC METABOLIC PANEL - Abnormal; Notable for the following components:   CO2 21 (*)    All other components within normal limits  URINALYSIS, ROUTINE W REFLEX MICROSCOPIC - Abnormal; Notable for the following components:   APPearance CLOUDY (*)    Leukocytes,Ua TRACE (*)    Bacteria, UA RARE (*)    All other components within normal limits  RAPID URINE DRUG SCREEN, HOSP PERFORMED - Abnormal; Notable for the following components:   Benzodiazepines POSITIVE (*)    All other components within normal limits  ETHANOL  POC URINE PREG, ED    EKG EKG Interpretation  Date/Time:  Wednesday September 10 2020 00:17:00 EDT Ventricular Rate:  72 PR Interval:    QRS Duration: 81 QT Interval:  404 QTC Calculation: 443 R Axis:   58 Text Interpretation: Sinus rhythm Minimal ST depression, inferior leads Confirmed by Ross Marcus ([id number]) on 09/10/2020 12:31:53 AM   Radiology No results found.  Procedures Procedures   Medications Ordered in ED Medications - No data to display  ED Course  I have reviewed the triage vital signs and the nursing notes.  Pertinent labs & imaging results that were available during my care of the patient were reviewed by me and  considered in my medical decision making (see chart for details).    MDM Rules/Calculators/A&P                          Patient presents with an episode of near syncope or loss of consciousness.  She is overall nontoxic.  She does have an extensive history for 35 year old nonischemic cardiomyopathy and heart failure.  She has had repeated emergency room visits for similar symptoms and chest pain in the past.  Not having any chest pain currently.  Vital signs are reassuring.  EKG without acute ischemia or arrhythmia.  Will not repeat troponins at this time as I have low suspicion for ACS.  History suggestive of near syncope but seizure is also consideration.  Labs obtained.  No significant metabolic derangements.  UDS positive for benzos.  Ethanol level negative.  UDS is reassuring.  Discussed results with the patient.  She was reassured.  Recommend not driving until evaluated by her primary physician and neurology to rule out possible seizure.  After history, exam, and medical workup I feel the patient has been appropriately medically screened and is safe for discharge home. Pertinent diagnoses were discussed with the patient. Patient was given return precautions.   Final Clinical Impression(s) / ED Diagnoses Final diagnoses:  Near syncope    Rx / DC Orders ED Discharge Orders    None       Mekenzie Modeste, Mayer Masker, MD 09/10/20 0127
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
## 2020-09-10 NOTE — Telephone Encounter (Signed)
Transition Care Management Unsuccessful Follow-up Telephone Call  Date of discharge and from where:  09/06/2020 - [facility]  Attempts:  3rd Attempt  Reason for unsuccessful TCM follow-up call:  Unable to leave message   Pt is a repeat ED visitor. She is seen for same symptoms each time she goes. Pt has an appointment to establish care with CHW on 09/15/2020 at 1050. She also has an appointment with Cardiology on 10/28/2020 at 0930 (pt is already established at this office).
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
## 2020-09-11 ENCOUNTER — Telehealth: Payer: Self-pay | Admitting: *Deleted

## 2020-09-11 NOTE — Telephone Encounter (Signed)
Transition Care Management Follow-up Telephone Call  Date of discharge and from where:09-10-2020 [facility]  How have you been since you were released from the hospital? better  Any questions or concerns? no  Items Reviewed:  Did the pt receive and understand the discharge instructions provided? Yes   Medications obtained and verified? na  Other? na  Any new allergies since your discharge? No   Dietary orders reviewed? na  Do you have support at home? Yes    Functional Questionnaire: (I = Independent and D = Dependent) ADLs:I  Bathing/Dressing-I  Meal Prep- I  Eating- I  Maintaining continence- I  Transferring/Ambulation-I  Managing Meds-I  Follow up appointments reviewed:   PCP Hospital f/u appt confirmed? YES primary care on vacation for 2 weeks will see when he gets back  Specialist Hospital f/u appt confirmed? No    Are transportation arrangements needed? No   If their condition worsens, is the pt aware to call PCP or go to the Emergency Dept.?  yes  Was the patient provided with contact information for the PCP's office or ED? yes  Was to pt encouraged to call back with questions or concerns? yes

## 2020-09-12 ENCOUNTER — Other Ambulatory Visit: Payer: Self-pay

## 2020-09-12 ENCOUNTER — Encounter (HOSPITAL_COMMUNITY): Payer: Self-pay

## 2020-09-12 ENCOUNTER — Telehealth (HOSPITAL_COMMUNITY): Payer: Self-pay | Admitting: *Deleted

## 2020-09-12 ENCOUNTER — Ambulatory Visit (HOSPITAL_COMMUNITY)
Admission: RE | Admit: 2020-09-12 | Discharge: 2020-09-12 | Disposition: A | Discharge status: Home / Self Care | Payer: Medicaid Other | Source: Ambulatory Visit | Attending: Cardiology | Admitting: Cardiology

## 2020-09-12 ENCOUNTER — Other Ambulatory Visit (HOSPITAL_COMMUNITY): Payer: Self-pay | Admitting: Cardiology

## 2020-09-12 VITALS — Wt 120.2 lb

## 2020-09-12 DIAGNOSIS — F411 Generalized anxiety disorder: Secondary | ICD-10-CM | POA: Insufficient documentation

## 2020-09-12 DIAGNOSIS — I110 Hypertensive heart disease with heart failure: Secondary | ICD-10-CM | POA: Insufficient documentation

## 2020-09-12 DIAGNOSIS — Z793 Long term (current) use of hormonal contraceptives: Secondary | ICD-10-CM | POA: Diagnosis not present

## 2020-09-12 DIAGNOSIS — I951 Orthostatic hypotension: Secondary | ICD-10-CM | POA: Insufficient documentation

## 2020-09-12 DIAGNOSIS — R42 Dizziness and giddiness: Secondary | ICD-10-CM

## 2020-09-12 DIAGNOSIS — R002 Palpitations: Secondary | ICD-10-CM | POA: Diagnosis not present

## 2020-09-12 DIAGNOSIS — Z8616 Personal history of COVID-19: Secondary | ICD-10-CM | POA: Insufficient documentation

## 2020-09-12 DIAGNOSIS — I5022 Chronic systolic (congestive) heart failure: Secondary | ICD-10-CM | POA: Insufficient documentation

## 2020-09-12 DIAGNOSIS — R9431 Abnormal electrocardiogram [ECG] [EKG]: Secondary | ICD-10-CM | POA: Diagnosis not present

## 2020-09-12 DIAGNOSIS — D508 Other iron deficiency anemias: Secondary | ICD-10-CM | POA: Diagnosis not present

## 2020-09-12 DIAGNOSIS — Z79899 Other long term (current) drug therapy: Secondary | ICD-10-CM | POA: Diagnosis not present

## 2020-09-12 DIAGNOSIS — E875 Hyperkalemia: Secondary | ICD-10-CM | POA: Insufficient documentation

## 2020-09-12 DIAGNOSIS — R55 Syncope and collapse: Secondary | ICD-10-CM

## 2020-09-12 DIAGNOSIS — F1011 Alcohol abuse, in remission: Secondary | ICD-10-CM | POA: Diagnosis not present

## 2020-09-12 DIAGNOSIS — R0789 Other chest pain: Secondary | ICD-10-CM | POA: Insufficient documentation

## 2020-09-12 DIAGNOSIS — Z791 Long term (current) use of non-steroidal anti-inflammatories (NSAID): Secondary | ICD-10-CM | POA: Insufficient documentation

## 2020-09-12 LAB — IRON AND TIBC
Iron: 82 ug/dL (ref 28–170)
Saturation Ratios: 27 % (ref 10.4–31.8)
TIBC: 301 ug/dL (ref 250–450)
UIBC: 219 ug/dL

## 2020-09-12 LAB — FERRITIN: Ferritin: 37 ng/mL (ref 11–307)

## 2020-09-12 NOTE — Telephone Encounter (Signed)
Per Dr Shirlee Latch:  From: Laurey Morale, MD  Sent: 09/11/2020  3:31 PM EDT  To: Brittainy Sherlynn Carbon, PA-C  Subject: RE:                        Need to see what her blood pressure is. We have been up & down a lot on her meds   Pt needs appt to f/u multiple ED visits  Spoke w/pt appt sch for today at 10:30 with Robbie Lis, PA

## 2020-09-12 NOTE — Progress Notes (Signed)
Advanced Heart Failure Clinic Note   Referring Physician: PCP: Laurey Morale, MD PCP-Cardiologist: Dr. Shirlee Latch    HPI: 35 y.o. female with past history of HTN presented to [facility] on 05/10/19 with cough, SOB, LE swelling, orthopnea, bendopnea and 20lb weight gain over 3 months. She had a history of increased alcohol use over the several months prior with 7-8 airplane bottles of liquor 3-4 days per week. BNP was elevated and CTA showed pulmonary edema. Echo showed new onset systolic HF with EF 15% and decreased RV systolic function. Cardiology was consulted and she was transferred to [facility].   While admitted she was diuresed with IV lasix. Right and Left heart cath were done. LHC showed no significant coronary artery disease and RHC was within normal limits. Her cardiomyopathy is possibly secondary to recent increased alcohol use or uncontrolled HTN. Viral myocarditis was felt to be a possible etiology with recent cough, however cMRI showed no myocardial LGE, so no definitive evidence for prior MI, infiltrative disease, or myocarditis.  Also of note, she has no family history of cardiomyopathy, negative HIV, and TSH was normal.   Echo in 3/21 showed EF 30-35%, global hypokinesis, mild RV dilation with normal function, mild MR.   On 06/06/20, she had a CBD gummy with a high dosage of CBD in it.  Shortly afterwards, she became lightheaded and dizzy and actually passed out.  She had been out of her meds for several days and her BP was elevated when she went to the ER.  HS-TnI and D dimer were normal. She was discharged home.   Echo in 1/22 showed EF up to 40-45%. She wore a Zio patch in 1/22 with no significant events but only kept it on for about 4 days.   In 2/22, she was admitted with hypertensive urgency with syncope.  BP 218/110.  She had not been taking her evening BP med doses.  BP was controlled and she was discharged.  Later in 2/22, she went to the ER with chest pain.  Troponin was negative  but BP was high.  She was sent home.    Evaluted in clinic, 08/22/20 and was hypertensive. Also very anxious. EKG showed NSR w/ mildly prolonged QTc 474 ms. Labs reviewed and showed hypokalemia. KCl 20 daily was added. Amlodipine 5 mg daily also added. Dr. Shirlee Latch also started sertraline 25 mg daily for marked anxiety.   Had return clinic f/u on 3/1. Just prior to that had been seen in the ED on 2/27 for continued anxiety and palpitations. She reported the ED physician gave her something for possible withdrawals, which had helped significantly. She had quit drinking 8 days prior.  BMP was checked on 2/27 and hypokalemia had resolved, K 3.5 (recently as low at 2.9). SCr ok at 0.79. She continued w/ frequent episodes of low BP at home and dizziness as well as fatigue. Coreg was reduced to 3.125 bid and amlodipine discontinued.   Since then, she has had multiple ED presentations for dizziness, near syncope, atypical CP and palpitations. Work-ups have been unremarkable, other than soft BP. This has lead to discontinuation of all of her BP active medications including Entresto. Remains on ivabradine and KCl 20 Meq daily. Just seen in ED 2 days ago and BMP showed K 3.8   She continues to endorse the above symptoms to me on almost a daily basis. She is orthostatic in clinic today w/ > 15 beat increase in HR and nearly a 20 mmHg SBP drop (  118>>100). Recent BMP 2 days ago did not suggest dehydration, SCr 0.76. BUN 12. CBC did show chronic anemia Hgb 10.   Orthostatic Vital Signs   Lying 118/78  HR 78  Sitting 100/78  HR 76  Standing 100/78 HR 103    Labs (11/20): K 3.8, creatinine 1.1 Labs (2/21): K 3.6, creatinine 0.89 Labs (12/21): BNP 145, K 3.7, creatinine 0.67, hgb 12.2 Labs (2/22): K 3.4, creatinine 0.79, hgb 11.5 Labs (2/22): K 3.5, Creatinine 0.79 Labs (3/22): K 3.8, Creatinine 0.76  PMH: 1. HTN - Renal artery dopplers (3/21): No evidence for renal artery stenosis.  2. ETOH abuse: Has  quit.  3. Chronic systolic CHF: Nonischemic cardiomyopathy.  - LHC/RHC (11/20): No significant CAD; mean RA 1, PA 24/8, mean PCWP 5, CI 3.54.  - Echo (11/20): EF 15%, diffuse hypokinesis, mildly decreased RV systolic function.  - CMRI (11/20): LV mildly dilated with EF 16%, normal RV size with EF 28%, no myocardial LGE.  - Echo (3/21): EF 30-35%, global hypokinesis, mild RV dilation with normal function, mild MR.  - Echo (1/22): EF 40-45%, normal RV.  4. COVID-19 infection 9/21 5. Prolonged QT interval  Review of Systems: All systems reviewed and negative except as per HPI.    Current Outpatient Medications  Medication Sig Dispense Refill  . acetaminophen (TYLENOL) 325 MG tablet Take 650 mg by mouth every 6 (six) hours as needed for moderate pain.    Marland Kitchen amLODipine (NORVASC) 5 MG tablet Take 1 tablet (5 mg total) by mouth daily. Only take if SBP greater than 140 30 tablet 11  . etonogestrel (NEXPLANON) 68 MG IMPL implant Inject 1 each into the skin continuous.    . ivabradine (CORLANOR) 5 MG TABS tablet Take 0.5 tablets (2.5 mg total) by mouth 2 (two) times daily with a meal. 90 tablet 3  . meclizine (ANTIVERT) 25 MG tablet Take 25 mg by mouth 3 (three) times daily as needed for dizziness.    . naproxen (NAPROSYN) 500 MG tablet Take 500 mg by mouth 2 (two) times daily as needed.    . potassium chloride SA (KLOR-CON) 20 MEQ tablet Take 1 tablet (20 mEq total) by mouth daily. 90 tablet 3  . sertraline (ZOLOFT) 25 MG tablet Take 1 tablet (25 mg total) by mouth daily. 30 tablet 2   No current facility-administered medications for this encounter.    No Known Allergies    Social History   Socioeconomic History  . Marital status: Single    Spouse name: Not on file  . Number of children: Not on file  . Years of education: Not on file  . Highest education level: Not on file  Occupational History  . Not on file  Tobacco Use  . Smoking status: Never Smoker  . Smokeless tobacco: Never  Used  Vaping Use  . Vaping Use: Never used  Substance and Sexual Activity  . Alcohol use: Yes    Comment: occ  . Drug use: No  . Sexual activity: Yes    Birth control/protection: Implant  Other Topics Concern  . Not on file  Social History Narrative  . Not on file   Social Determinants of Health   Financial Resource Strain: Low Risk   . Difficulty of Paying Living Expenses: Not hard at all  Food Insecurity: No Food Insecurity  . Worried About Programme researcher, broadcasting/film/video in the Last Year: Never true  . Ran Out of Food in the Last Year: Never true  Transportation  Needs: No Transportation Needs  . Lack of Transportation (Medical): No  . Lack of Transportation (Non-Medical): No  Physical Activity: Inactive  . Days of Exercise per Week: 0 days  . Minutes of Exercise per Session: 0 min  Stress: No Stress Concern Present  . Feeling of Stress : Not at all  Social Connections: Moderately Integrated  . Frequency of Communication with Friends and Family: More than three times a week  . Frequency of Social Gatherings with Friends and Family: More than three times a week  . Attends Religious Services: More than 4 times per year  . Active Member of Clubs or Organizations: No  . Attends Banker Meetings: Never  . Marital Status: Living with partner  Intimate Partner Violence: Not At Risk  . Fear of Current or Ex-Partner: No  . Emotionally Abused: No  . Physically Abused: No  . Sexually Abused: No      Family History  Problem Relation Age of Onset  . Diabetes Maternal Grandmother   . Cancer Paternal Grandfather        prancreatic  . COPD Maternal Grandfather   . Cancer Maternal Grandfather        prostate  . Asthma Maternal Grandfather   . Asthma Son   . Bronchitis Son     Vitals:   09/12/20 1046 09/12/20 1050 09/12/20 1051  SpO2: 100% 100% 99%  Weight: 54.5 kg     PHYSICAL EXAM: General:  Thin young female. No respiratory difficulty. Appears less anxious today   HEENT: normal Neck: supple. no JVD. Carotids 2+ bilat; no bruits. No lymphadenopathy or thyromegaly appreciated. Cor: PMI nondisplaced. Regular rate & rhythm. No rubs, gallops or murmurs. Lungs: clear Abdomen: soft, nontender, nondistended. No hepatosplenomegaly. No bruits or masses. Good bowel sounds. Extremities: no cyanosis, clubbing, rash, thin extremities no edema Neuro: alert & oriented x 3, cranial nerves grossly intact. moves all 4 extremities w/o difficulty. Affect pleasant.    ASSESSMENT & PLAN:  1. Chronic systolic CHF: Cardiomyopathy of uncertain etiology. No FH cardiomyopathy, negative HIV and normal TSH, no recent pregnancy. She had been drinking heavily for several months prior to diagnosis, ETOH may contribute to her cardiomyopathy. Uncontrolled HTN likely plays a role. LHC 04/2019 w/ no CAD, filling pressures optimized and preserved cardiac output. Cardiac MRI showed no myocardial LGE, so no definitive evidence for prior MI, infiltrative disease, or myocarditis. Echo in 3/21 showed EF 30-35%, echo in 1/22 with EF up to 40-45%, normal RV.  NYHA class II symptoms generally.  Not volume overloaded.  She has quit ETOH.  - now off all BP active meds due to symtomatic orthostatic hypotension  (off Entresto, spiro, Coreg) - continue ivabradine   - Narrow QRS, not CRT candidate. She is out of ICD range with EF 40-45% on last echo.   2. HTN: H/o poor control, ? If some of this has been anxiety provoked. She is now off all meds given orthostatic hypotension. BPs have been well controlled/ soft at recent clinic f/us. No evidence for fibromuscular dysplasia on renal artery dopplers.  - she will continue off meds for now. Has BP cuff at home and will monitor for recurrent hypertension  3. ETOH abuse: She reports she has quit ETOH. 4. 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 Remains stable on repeat EKGs - on daily KCl for hyperkalemia (BMP 3/16 K 3.8)  5. Anxiety: Marked  generalized anxiety.   - Improving w/ medical therapy. Continue sertraline 25 mg daily.  6. Palpitations, Syncope/ Orthostatic Hypotension  - ? POTS in this young female. Refer to Dr. Graciela Husbands  -  Gave Rx for medical grade compression hoses to help minimize orthostatic hypotension - advised to liberalize salt, cautiously, monitoring BP and daily wts - Plan 2 week zio patch to r/o cardiac arrrhymias. If unremarkable, may need loop recorder - she also has chronic anemia. Recent Hgb 10. Check Iron, ferritin and TIBC. May benefit from PO Iron supplementation and or Feraheme    F/u w/ Dr. Shirlee Latch in 4 weeks   Robbie Lis, PA-C 09/12/20

## 2020-09-12 NOTE — Patient Instructions (Signed)
Labs done today, your results will be available in MyChart, we will contact you for abnormal readings.  Your provider has recommended that  you wear a Zio Patch for 14 days.  This monitor will record your heart rhythm for our review.  IF you have any symptoms while wearing the monitor please press the button.  If you have any issues with the patch or you notice a red or orange light on it please call the company at [phone].  Once you remove the patch please mail it back to the company as soon as possible so we can get the results.  Please wear your compression hose daily, place them on as soon as you get up in the morning and remove before you go to bed at night.  Liberalize your salt intake  You have been referred to Dr Graciela Husbands, his office will call you for an appointment  Your physician recommends that you schedule a follow-up appointment in: 1 month  If you have any questions or concerns before your next appointment please send [place] a message through [facility] or call our office at [phone].    TO LEAVE A MESSAGE FOR THE NURSE SELECT OPTION 2, PLEASE LEAVE A MESSAGE INCLUDING: . YOUR NAME . DATE OF BIRTH . CALL BACK NUMBER . REASON FOR CALL**this is important as we prioritize the call backs  YOU WILL RECEIVE A CALL BACK THE SAME DAY AS LONG AS YOU CALL BEFORE 4:00 PM  At the [facility] Clinic, you and your health needs are our priority. As part of our continuing mission to provide you with exceptional heart care, we have created designated Provider Care Teams. These Care Teams include your primary Cardiologist (physician) and Advanced Practice Providers (APPs- Physician Assistants and Nurse Practitioners) who all work together to provide you with the care you need, when you need it.   You may see any of the following providers on your designated Care Team at your next follow up: Marland Kitchen Dr Arvilla Meres . Dr Marca Ancona . Dr Thornell Mule . Tonye Becket, NP . Robbie Lis, PA . Shanda Bumps [place],NP . Karle Plumber, PharmD   Please be sure to bring in all your medications bottles to every appointment.

## 2020-09-12 NOTE — Progress Notes (Signed)
Heart and Vascular Care Navigation  09/12/2020  Tina Fletcher [birth year] [id number]  Reason for Referral: CSW met with pt in clinic to check in regarding anxiety and disability status.                                                                                                    Assessment:  Pt states she has been doing ok since we last spoke but did go to ED yesterday.  States she is feeling better since then however. Pt feeling a little trapped at home as she isn't supposed to drive right now with frequent syncope episodes- has taken up reading at home and watching tv as ways to occupy her time while the kids are at school.  CSW provided pt with information for Women's Support Group- pt is interested in attending to meet other women who are having similar experiences as her.  Pt also reports she continue to want to pursue disability and was informed she does not have an active case.  Would like referral to [facility] to reapply.                                  HRT/VAS Care Coordination    Patients Home Cardiology Office Heart Failure Clinic   Outpatient Care Team [facility] Paramedic Name: Jeris Penta- [phone]   Social Worker Name: Tammy Sours- Laura arrangements for the past 2 months Single Family Home   Lives with: Significant Other; Minor Children   Patient Current Insurance Coverage Managed Medicare   Patient Has Concern With Paying Medical Bills No   Does Patient Have Prescription Coverage? Yes   Home Assistive Devices/Equipment Blood pressure cuff      Social History:                                                                             SDOH Screenings   Alcohol Screen: Medium Risk  . Last Alcohol Screening Score (AUDIT): 11  Depression (PHQ2-9): Low Risk   . PHQ-2 Score: 0  Financial Resource Strain: Low Risk   . Difficulty of Paying Living Expenses: Not hard at all  Food  Insecurity: No Food Insecurity  . Worried About Charity fundraiser in the Last Year: Never true  . Ran Out of Food in the Last Year: Never true  Housing: Low Risk   . Last Housing Risk Score: 0  Physical Activity: Inactive  . Days of Exercise per Week: 0 days  . Minutes of Exercise per Session: 0 min  Social Connections: Moderately Integrated  . Frequency of Communication with Friends and Family: More than three times a week  . Frequency of Social  Gatherings with Friends and Family: More than three times a week  . Attends Religious Services: More than 4 times per year  . Active Member of Clubs or Organizations: No  . Attends Archivist Meetings: Never  . Marital Status: Living with partner  Stress: No Stress Concern Present  . Feeling of Stress : Not at all  Tobacco Use: Low Risk   . Smoking Tobacco Use: Never Smoker  . Smokeless Tobacco Use: Never Used  Transportation Needs: No Transportation Needs  . Lack of Transportation (Medical): No  . Lack of Transportation (Non-Medical): No    SDOH Interventions: Financial Resources:  Financial Strain Interventions: Other (Comment) (referral to [facility] for disability application)   Food Insecurity:   none reported  Housing Insecurity:   none reported  Transportation:    some struggles with transportation due to inability to drive with frequent syncope episodes- referral made to [employer] to help with getting to medical appts   Follow-up plan:    Pt to work with [employer] if deemed eligible for disability- unsure with EF of 40-45%  Pt to continue practicing coping mechanisms with anxiety and will plan to attend womens support group.  Will continue to follow and assist as needed  Jorge Ny, [facility] Desk#: [phone] Cell#: [phone]

## 2020-09-15 ENCOUNTER — Ambulatory Visit: Payer: Medicaid Other | Attending: Family Medicine | Admitting: Family Medicine

## 2020-09-15 ENCOUNTER — Emergency Department (HOSPITAL_COMMUNITY)
Admission: EM | Admit: 2020-09-15 | Discharge: 2020-09-15 | Disposition: A | Discharge status: Home / Self Care | Payer: Medicaid Other | Attending: Emergency Medicine | Admitting: Emergency Medicine

## 2020-09-15 ENCOUNTER — Emergency Department (HOSPITAL_COMMUNITY): Payer: Medicaid Other

## 2020-09-15 ENCOUNTER — Encounter (HOSPITAL_COMMUNITY): Payer: Self-pay | Admitting: *Deleted

## 2020-09-15 ENCOUNTER — Encounter: Payer: Self-pay | Admitting: Family Medicine

## 2020-09-15 ENCOUNTER — Other Ambulatory Visit: Payer: Self-pay

## 2020-09-15 DIAGNOSIS — R636 Underweight: Secondary | ICD-10-CM | POA: Diagnosis not present

## 2020-09-15 DIAGNOSIS — I5022 Chronic systolic (congestive) heart failure: Secondary | ICD-10-CM | POA: Diagnosis not present

## 2020-09-15 DIAGNOSIS — R002 Palpitations: Secondary | ICD-10-CM | POA: Diagnosis not present

## 2020-09-15 DIAGNOSIS — I429 Cardiomyopathy, unspecified: Secondary | ICD-10-CM | POA: Insufficient documentation

## 2020-09-15 DIAGNOSIS — D649 Anemia, unspecified: Secondary | ICD-10-CM

## 2020-09-15 DIAGNOSIS — R079 Chest pain, unspecified: Secondary | ICD-10-CM

## 2020-09-15 LAB — COMPREHENSIVE METABOLIC PANEL
ALT: 17 U/L (ref 0–44)
AST: 16 U/L (ref 15–41)
Albumin: 4.5 g/dL (ref 3.5–5.0)
Alkaline Phosphatase: 33 U/L — ABNORMAL LOW (ref 38–126)
Anion gap: 9 (ref 5–15)
BUN: 11 mg/dL (ref 6–20)
CO2: 23 mmol/L (ref 22–32)
Calcium: 8.9 mg/dL (ref 8.9–10.3)
Chloride: 102 mmol/L (ref 98–111)
Creatinine, Ser: 0.74 mg/dL (ref 0.44–1.00)
GFR, Estimated: >60 mL/min (ref >60)
Glucose, Bld: 86 mg/dL (ref 70–99)
Potassium: 3.7 mmol/L (ref 3.5–5.1)
Sodium: 134 mmol/L — ABNORMAL LOW (ref 135–145)
Total Bilirubin: 0.4 mg/dL (ref 0.3–1.2)
Total Protein: 7.1 g/dL (ref 6.5–8.1)

## 2020-09-15 LAB — CBC WITH DIFFERENTIAL/PLATELET
Abs Immature Granulocytes: 0.01 10*3/uL (ref 0.00–0.07)
Basophils Absolute: 0 10*3/uL (ref 0.0–0.1)
Basophils Relative: 1 %
Eosinophils Absolute: 0.1 10*3/uL (ref 0.0–0.5)
Eosinophils Relative: 2 %
HCT: 34.5 % — ABNORMAL LOW (ref 36.0–46.0)
Hemoglobin: 11.2 g/dL — ABNORMAL LOW (ref 12.0–15.0)
Immature Granulocytes: 0 %
Lymphocytes Relative: 38 %
Lymphs Abs: 2.1 10*3/uL (ref 0.7–4.0)
MCH: 28.7 pg (ref 26.0–34.0)
MCHC: 32.5 g/dL (ref 30.0–36.0)
MCV: 88.5 fL (ref 80.0–100.0)
Monocytes Absolute: 0.4 10*3/uL (ref 0.1–1.0)
Monocytes Relative: 7 %
Neutro Abs: 2.8 10*3/uL (ref 1.7–7.7)
Neutrophils Relative %: 52 %
Platelets: 164 10*3/uL (ref 150–400)
RBC: 3.9 MIL/uL (ref 3.87–5.11)
RDW: 12.5 % (ref 11.5–15.5)
WBC: 5.5 10*3/uL (ref 4.0–10.5)
nRBC: 0 % (ref 0.0–0.2)

## 2020-09-15 LAB — LIPASE, BLOOD: Lipase: 31 U/L (ref 11–51)

## 2020-09-15 MED ORDER — MEGESTROL ACETATE 20 MG PO TABS: 20.0000 mg | ORAL_TABLET | Freq: Every day | ORAL | 0 refills | Status: DC

## 2020-09-15 NOTE — Discharge Instructions (Addendum)
Follow-up with your doctor as planned.  Return if any problem °

## 2020-09-15 NOTE — Progress Notes (Signed)
Virtual Visit via Telephone Note  I connected with Tina Fletcher, on 09/15/2020 at 11:08 AM by telephone due to the COVID-19 pandemic and verified that I am speaking with the correct person using two identifiers.   Consent: I discussed the limitations, risks, security and privacy concerns of performing an evaluation and management service by telephone and the availability of in person appointments. I also discussed with the patient that there may be a patient responsible charge related to this service. The patient expressed understanding and agreed to proceed.   Location of Patient: Home  Location of Provider: Clinic   Persons participating in Telemedicine visit: Durene Cal Dr. Alvis Lemmings     History of Present Illness: 35 year old female with a history of HFrEF (EF 40 to 45% from echo 06/2020, LVH, global hypokinesis), previous alcohol abuse who presents today to establish care.   She has dyspnea with mild exertion like cooking or taking a shower. She has no pedal edema She has chest pains intermittently for which she uses Naproxen; last used Naproxen yesterday with resolution of symptoms. She quit alcohol 1 month and 2 days ago 10/07/20 is her next Cardiology appointment. Today at home her vitals were BP 117/70, P 92. When she stands up from a sitting position she feels lightheaded.  Her med list reveals she was prescribed meclizine in the past but she has not been taking it due to concerns that it might lower her blood pressure.  She has been loosing weight down from 137 to 120 in 1 month since she quit alcohol. She has lost her appetite since she stopped drinking. Last set of labs ordered by cardiology reveal presence of anemia and she denies heavy bleeding with her menstrual cycles. Past Medical History:  Diagnosis Date  . Abnormal menstrual periods 02/12/2014  . CHF (congestive heart failure) (HCC)   . Herpes simplex without mention of complication   .  Hypertension   . Medical history non-contributory   . Migraines   . Nexplanon insertion 08/13/2013   08/13/13 inserted left arm, remove 08/13/16  . Vitamin D deficiency 04/01/2016   No Known Allergies  Current Outpatient Medications on File Prior to Visit  Medication Sig Dispense Refill  . acetaminophen (TYLENOL) 325 MG tablet Take 650 mg by mouth every 6 (six) hours as needed for moderate pain.    Marland Kitchen etonogestrel (NEXPLANON) 68 MG IMPL implant Inject 1 each into the skin continuous.    . ivabradine (CORLANOR) 5 MG TABS tablet Take 0.5 tablets (2.5 mg total) by mouth 2 (two) times daily with a meal. 90 tablet 3  . meclizine (ANTIVERT) 25 MG tablet Take 25 mg by mouth 3 (three) times daily as needed for dizziness.    . naproxen (NAPROSYN) 500 MG tablet Take 500 mg by mouth 2 (two) times daily as needed.    . potassium chloride SA (KLOR-CON) 20 MEQ tablet Take 1 tablet (20 mEq total) by mouth daily. 90 tablet 3  . sertraline (ZOLOFT) 25 MG tablet Take 1 tablet (25 mg total) by mouth daily. 30 tablet 2   No current facility-administered medications on file prior to visit.    ROS: See HPI     Observations/Objective: Awake, alert, oriented x3 Not in acute distress  CBC    Component Value Date/Time   WBC 5.3 09/10/2020 0045   RBC 3.52 (L) 09/10/2020 0045   HGB 10.3 (L) 09/10/2020 0045   HGB 12.7 03/19/2016 1111   HCT 31.3 (L) 09/10/2020 0045  HCT 39.2 03/19/2016 1111   PLT 143 (L) 09/10/2020 0045   PLT 222 03/19/2016 1111   MCV 88.9 09/10/2020 0045   MCV 90 03/19/2016 1111   MCH 29.3 09/10/2020 0045   MCHC 32.9 09/10/2020 0045   RDW 12.6 09/10/2020 0045   RDW 14.1 03/19/2016 1111   LYMPHSABS 1.7 09/10/2020 0045   MONOABS 0.5 09/10/2020 0045   EOSABS 0.1 09/10/2020 0045   BASOSABS 0.0 09/10/2020 0045    Assessment and Plan: 1. 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 Underweight Noticeable since she quit alcohol Thyroid panel was normal Malignancy evaluation by means of Pap smear was normal from outside facility in 02/2019 Would love to place her on Remeron however prolonged QT precludes this Short course of Megace for just 1 month and will reassess at her next visit We will need to watch out for worsening CHF and discontinue if that is noticed - megestrol (MEGACE) 20 MG tablet; Take 1 tablet (20 mg total) by mouth daily.  Dispense: 30 tablet; Refill: 0  3. Anemia, unspecified type Hemoglobin of 10.3 Anemia work-up at her next visit Advised to increase intake of iron rich foods and commence OTC iron tablets   Follow Up Instructions: 6 weeks to follow-up on weight   I discussed the assessment and treatment plan with the patient. The patient was provided an opportunity to ask questions and all were answered. The patient agreed with the plan and demonstrated an understanding of the instructions.   The patient was advised to call back or seek an in-person evaluation if the symptoms worsen or if the condition fails to improve as anticipated.     I provided 14 minutes total of non-face-to-face time during this encounter.   Hoy Register, MD, FAAFP. [facility] [place], [place] [phone]   09/15/2020, 11:08 AM

## 2020-09-15 NOTE — ED Triage Notes (Signed)
States she has chest pain for the past 2 hours

## 2020-09-15 NOTE — ED Provider Notes (Signed)
[facility] EMERGENCY DEPARTMENT Provider Note   CSN: [id number] Arrival date & time: 09/15/20  1755     History Chief Complaint  Patient presents with  . Chest Pain    Tina Fletcher is a 35 y.o. female.  Patient states she had some palpitations.  Patient has a history of cardiomyopathy.  Patient states her symptoms have improved.   Palpitations Palpitations quality:  Irregular Onset quality:  Sudden Timing:  Rare Progression:  Resolved Chronicity:  Chronic Context: anxiety   Relieved by:  Nothing Worsened by:  Nothing Ineffective treatments:  None tried Associated symptoms: no back pain, no chest pain and no cough        Past Medical History:  Diagnosis Date  . Abnormal menstrual periods 02/12/2014  . CHF (congestive heart failure) (HCC)   . Herpes simplex without mention of complication   . Hypertension   . Medical history non-contributory   . Migraines   . Nexplanon insertion 08/13/2013   08/13/13 inserted left arm, remove 08/13/16  . Vitamin D deficiency 04/01/2016    Patient Active Problem List   Diagnosis Date Noted  . Hypertensive crisis 08/18/2020  . Hypokalemia 08/18/2020  . Prolonged QT interval 08/18/2020  . Syncope   . Chronic systolic CHF (congestive heart failure) (HCC)   . Cervical cancer screening 10/23/2019  . Systolic CHF, acute (HCC) 05/10/2019  . Vitamin D deficiency 04/01/2016  . Nexplanon insertion 08/13/2013  . Thrombocytopenia (HCC) 04/17/2013  . 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 Underweight 01/08/2013    Past Surgical History:  Procedure Laterality Date  . RIGHT/LEFT HEART CATH AND CORONARY ANGIOGRAPHY N/A 05/14/2019   Procedure: RIGHT/LEFT HEART CATH AND CORONARY ANGIOGRAPHY;  Surgeon: Laurey Morale, MD;  Location: [facility] INVASIVE CV LAB;  Service: Cardiovascular;  Laterality: N/A;     OB History    Gravida  2   Para  2   Term  2   Preterm      AB      Living  2     SAB      IAB      Ectopic       Multiple      Live Births  2           Family History  Problem Relation Age of Onset  . Diabetes Maternal Grandmother   . Cancer Paternal Grandfather        prancreatic  . COPD Maternal Grandfather   . Cancer Maternal Grandfather        prostate  . Asthma Maternal Grandfather   . Asthma Son   . Bronchitis Son     Social History   Tobacco Use  . Smoking status: Never Smoker  . Smokeless tobacco: Never Used  Vaping Use  . Vaping Use: Never used  Substance Use Topics  . Alcohol use: Yes    Comment: occ  . Drug use: No    Home Medications Prior to Admission medications   Medication Sig Start Date End Date Taking? Authorizing Provider  acetaminophen (TYLENOL) 325 MG tablet Take 650 mg by mouth every 6 (six) hours as needed for moderate pain.    [provider]  etonogestrel (NEXPLANON) 68 MG IMPL implant Inject 1 each into the skin continuous.    [provider]  ivabradine (CORLANOR) 5 MG TABS tablet Take 0.5 tablets (2.5 mg total) by mouth 2 (two) times daily with a meal. 07/01/20   Laurey Morale, MD  meclizine (ANTIVERT) 25 MG  tablet Take 25 mg by mouth 3 (three) times daily as needed for dizziness. 08/23/20 09/22/20  [provider]  megestrol (MEGACE) 20 MG tablet Take 1 tablet (20 mg total) by mouth daily. 09/15/20   Hoy Register, MD  naproxen (NAPROSYN) 500 MG tablet Take 500 mg by mouth 2 (two) times daily as needed.    [provider]  potassium chloride SA (KLOR-CON) 20 MEQ tablet Take 1 tablet (20 mEq total) by mouth daily. 08/22/20   Laurey Morale, MD  sertraline (ZOLOFT) 25 MG tablet Take 1 tablet (25 mg total) by mouth daily. 08/22/20 08/22/21  Laurey Morale, MD    Allergies    Patient has no known allergies.  Review of Systems   Review of Systems  Constitutional: Negative for appetite change and fatigue.  HENT: Negative for congestion, ear discharge and sinus pressure.   Eyes: Negative for discharge.   Respiratory: Negative for cough.   Cardiovascular: Positive for palpitations. Negative for chest pain.  Gastrointestinal: Negative for abdominal pain and diarrhea.  Genitourinary: Negative for frequency and hematuria.  Musculoskeletal: Negative for back pain.  Skin: Negative for rash.  Neurological: Negative for seizures and headaches.  Psychiatric/Behavioral: Negative for hallucinations.    Physical Exam Updated Vital Signs BP (!) 136/98   Pulse 82   Temp 98 F (36.7 C)   Resp 19   Ht 5\' 9"  (1.753 m)   Wt 54.6 kg   SpO2 100%   BMI 17.77 kg/m   Physical Exam Vitals and nursing note reviewed.  Constitutional:      Appearance: She is well-developed.  HENT:     Head: Normocephalic.     Nose: Nose normal.  Eyes:     General: No scleral icterus.    Conjunctiva/sclera: Conjunctivae normal.  Neck:     Thyroid: No thyromegaly.  Cardiovascular:     Rate and Rhythm: Normal rate and regular rhythm.     Heart sounds: No murmur heard. No friction rub. No gallop.   Pulmonary:     Breath sounds: No stridor. No wheezing or rales.  Chest:     Chest wall: No tenderness.  Abdominal:     General: There is no distension.     Tenderness: There is no abdominal tenderness. There is no rebound.  Musculoskeletal:        General: Normal range of motion.     Cervical back: Neck supple.  Lymphadenopathy:     Cervical: No cervical adenopathy.  Skin:    Findings: No erythema or rash.  Neurological:     Mental Status: She is alert and oriented to person, place, and time.     Motor: No abnormal muscle tone.     Coordination: Coordination normal.  Psychiatric:        Behavior: Behavior normal.     ED Results / Procedures / Treatments   Labs (all labs ordered are listed, but only abnormal results are displayed) Labs Reviewed  CBC WITH DIFFERENTIAL/PLATELET - Abnormal; Notable for the following components:      Result Value   Hemoglobin 11.2 (*)    HCT 34.5 (*)    All other  components within normal limits  COMPREHENSIVE METABOLIC PANEL - Abnormal; Notable for the following components:   Sodium 134 (*)    Alkaline Phosphatase 33 (*)    All other components within normal limits  LIPASE, BLOOD    EKG EKG Interpretation  Date/Time:  Monday September 15 2020 18:57:12 EDT Ventricular Rate:  97 PR Interval:  178 QRS Duration: 78 QT Interval:  362 QTC Calculation: 459 R Axis:   67 Text Interpretation: Normal sinus rhythm Possible Left atrial enlargement Nonspecific ST abnormality Abnormal ECG Confirmed by Bethann Berkshire [phone]) on 09/15/2020 7:36:09 PM Also confirmed by Bethann Berkshire [phone])  on 09/15/2020 10:50:03 PM   Radiology DG Chest Port 1 View  Result Date: 09/15/2020 CLINICAL DATA:  Pain, chest pain. Nonradiating. History of congestive heart failure and hypertension. EXAM: PORTABLE CHEST 1 VIEW COMPARISON:  Chest x-ray 09/11/2020, CT chest 05/10/2019 FINDINGS: The heart size and mediastinal contours are within normal limits. No focal consolidation. No pulmonary edema. Nonspecific blunting of bilateral costophrenic angles. No pleural effusion. No pneumothorax. No acute osseous abnormality. IMPRESSION: No active disease. Electronically Signed   By: Tish Frederickson M.D.   On: 09/15/2020 20:59    Procedures Procedures   Medications Ordered in ED Medications - No data to display  ED Course  I have reviewed the triage vital signs and the nursing notes.  Pertinent labs & imaging results that were available during my care of the patient were reviewed by me and considered in my medical decision making (see chart for details).    MDM Rules/Calculators/A&P                         Patient was asymptomatic in the emergency department in normal sinus rhythm.  No acute changes on EKG and normal chest x-ray.  Patient with cardiomyopathy and palpitations symptoms.  She will follow up with her cardiologist in a couple weeks as planned.  She will return if any  problem Final Clinical Impression(s) / ED Diagnoses Final diagnoses:  Nonspecific chest pain    Rx / DC Orders ED Discharge Orders    None       Bethann Berkshire, MD 09/15/20 2300

## 2020-09-16 ENCOUNTER — Telehealth: Payer: Self-pay | Admitting: *Deleted

## 2020-09-16 NOTE — Telephone Encounter (Signed)
Transition Care Management Unsuccessful Follow-up Telephone Call  Date of discharge and from where:  09/15/2020 - [facility]  Attempts:  1st Attempt  Reason for unsuccessful TCM follow-up call:  Left voice message 

## 2020-09-17 NOTE — Telephone Encounter (Signed)
Transition Care Management Follow-up Telephone Call  Date of discharge and from where: 09/15/2020 - [facility]  How have you been since you were released from the hospital? "Fine"  Any questions or concerns? No  Items Reviewed:  Did the pt receive and understand the discharge instructions provided? Yes   Medications obtained and verified? Yes   Other? No   Any new allergies since your discharge? No   Dietary orders reviewed? No  Do you have support at home? Yes   Home Care and Equipment/Supplies: Were home health services ordered? not applicable If so, what is the name of the agency? N/A  Has the agency set up a time to come to the patient's home? not applicable Were any new equipment or medical supplies ordered?  No What is the name of the medical supply agency? N/A Were you able to get the supplies/equipment? not applicable Do you have any questions related to the use of the equipment or supplies? No  Functional Questionnaire: (I = Independent and D = Dependent) ADLs: I  Bathing/Dressing- I  Meal Prep- I  Eating- I  Maintaining continence- I  Transferring/Ambulation- I  Managing Meds- I  Follow up appointments reviewed:   PCP Hospital f/u appt confirmed? No    Specialist Hospital f/u appt confirmed? Yes  Scheduled to see Cardiology on 10/07/2020 @ 1520.  Are transportation arrangements needed? No   If their condition worsens, is the pt aware to call PCP or go to the Emergency Dept.? Yes  Was the patient provided with contact information for the PCP's office or ED? Yes  Was to pt encouraged to call back with questions or concerns? Yes

## 2020-09-19 ENCOUNTER — Telehealth: Payer: Self-pay | Admitting: *Deleted

## 2020-09-19 NOTE — Telephone Encounter (Signed)
Transition Care Management Unsuccessful Follow-up Telephone Call  Date of discharge and from where:  09/18/2020 [facility] ED  Attempts:  1st Attempt  Reason for unsuccessful TCM follow-up call:  Left voice message

## 2020-09-21 ENCOUNTER — Emergency Department (HOSPITAL_COMMUNITY): Payer: Medicaid Other

## 2020-09-21 ENCOUNTER — Other Ambulatory Visit: Payer: Self-pay

## 2020-09-21 ENCOUNTER — Emergency Department (HOSPITAL_COMMUNITY)
Admission: EM | Admit: 2020-09-21 | Discharge: 2020-09-21 | Disposition: A | Discharge status: Home / Self Care | Payer: Medicaid Other | Attending: Emergency Medicine | Admitting: Emergency Medicine

## 2020-09-21 ENCOUNTER — Encounter (HOSPITAL_COMMUNITY): Payer: Self-pay | Admitting: Emergency Medicine

## 2020-09-21 DIAGNOSIS — R519 Headache, unspecified: Secondary | ICD-10-CM | POA: Diagnosis not present

## 2020-09-21 DIAGNOSIS — I110 Hypertensive heart disease with heart failure: Secondary | ICD-10-CM | POA: Insufficient documentation

## 2020-09-21 DIAGNOSIS — R079 Chest pain, unspecified: Secondary | ICD-10-CM | POA: Diagnosis present

## 2020-09-21 DIAGNOSIS — I5022 Chronic systolic (congestive) heart failure: Secondary | ICD-10-CM | POA: Insufficient documentation

## 2020-09-21 DIAGNOSIS — Z79899 Other long term (current) drug therapy: Secondary | ICD-10-CM | POA: Insufficient documentation

## 2020-09-21 DIAGNOSIS — R0789 Other chest pain: Secondary | ICD-10-CM

## 2020-09-21 DIAGNOSIS — J3489 Other specified disorders of nose and nasal sinuses: Secondary | ICD-10-CM | POA: Insufficient documentation

## 2020-09-21 DIAGNOSIS — K219 Gastro-esophageal reflux disease without esophagitis: Secondary | ICD-10-CM | POA: Insufficient documentation

## 2020-09-21 LAB — CBC WITH DIFFERENTIAL/PLATELET
Abs Immature Granulocytes: 0.01 10*3/uL (ref 0.00–0.07)
Basophils Absolute: 0 10*3/uL (ref 0.0–0.1)
Basophils Relative: 1 %
Eosinophils Absolute: 0.1 10*3/uL (ref 0.0–0.5)
Eosinophils Relative: 3 %
HCT: 35.4 % — ABNORMAL LOW (ref 36.0–46.0)
Hemoglobin: 11.5 g/dL — ABNORMAL LOW (ref 12.0–15.0)
Immature Granulocytes: 0 %
Lymphocytes Relative: 35 %
Lymphs Abs: 1.4 10*3/uL (ref 0.7–4.0)
MCH: 29 pg (ref 26.0–34.0)
MCHC: 32.5 g/dL (ref 30.0–36.0)
MCV: 89.4 fL (ref 80.0–100.0)
Monocytes Absolute: 0.3 10*3/uL (ref 0.1–1.0)
Monocytes Relative: 7 %
Neutro Abs: 2.2 10*3/uL (ref 1.7–7.7)
Neutrophils Relative %: 54 %
Platelets: 201 10*3/uL (ref 150–400)
RBC: 3.96 MIL/uL (ref 3.87–5.11)
RDW: 12.9 % (ref 11.5–15.5)
WBC: 4.1 10*3/uL (ref 4.0–10.5)
nRBC: 0 % (ref 0.0–0.2)

## 2020-09-21 LAB — BASIC METABOLIC PANEL
Anion gap: 8 (ref 5–15)
BUN: 12 mg/dL (ref 6–20)
CO2: 23 mmol/L (ref 22–32)
Calcium: 9.7 mg/dL (ref 8.9–10.3)
Chloride: 107 mmol/L (ref 98–111)
Creatinine, Ser: 0.81 mg/dL (ref 0.44–1.00)
GFR, Estimated: >60 mL/min (ref >60)
Glucose, Bld: 91 mg/dL (ref 70–99)
Potassium: 3.9 mmol/L (ref 3.5–5.1)
Sodium: 138 mmol/L (ref 135–145)

## 2020-09-21 LAB — HEPATIC FUNCTION PANEL
ALT: 22 U/L (ref 0–44)
AST: 19 U/L (ref 15–41)
Albumin: 4.7 g/dL (ref 3.5–5.0)
Alkaline Phosphatase: 34 U/L — ABNORMAL LOW (ref 38–126)
Bilirubin, Direct: 0.1 mg/dL (ref 0.0–0.2)
Indirect Bilirubin: 0.5 mg/dL (ref 0.3–0.9)
Total Bilirubin: 0.6 mg/dL (ref 0.3–1.2)
Total Protein: 7.4 g/dL (ref 6.5–8.1)

## 2020-09-21 LAB — I-STAT BETA HCG BLOOD, ED (MC, WL, AP ONLY): I-stat hCG, quantitative: <5 m[IU]/mL (ref <5)

## 2020-09-21 LAB — LIPASE, BLOOD: Lipase: 33 U/L (ref 11–51)

## 2020-09-21 LAB — TROPONIN I (HIGH SENSITIVITY): Troponin I (High Sensitivity): <2 ng/L (ref <18)

## 2020-09-21 MED ORDER — LIDOCAINE VISCOUS HCL 2 % MT SOLN
15.0000 mL | Freq: Once | OROMUCOSAL | Status: AC
  Administered 2020-09-21: 15 mL via ORAL
  Filled 2020-09-21: qty 15

## 2020-09-21 MED ORDER — SUCRALFATE 1 GM/10ML PO SUSP: 1.0000 g | Freq: Three times a day (TID) | ORAL | 0 refills | Status: DC

## 2020-09-21 MED ORDER — ALUM & MAG HYDROXIDE-SIMETH 200-200-20 MG/5ML PO SUSP
30.0000 mL | Freq: Once | ORAL | Status: AC
  Administered 2020-09-21: 30 mL via ORAL
  Filled 2020-09-21: qty 30

## 2020-09-21 MED ORDER — PANTOPRAZOLE SODIUM 40 MG IV SOLR
40.0000 mg | Freq: Once | INTRAVENOUS | Status: AC
  Administered 2020-09-21: 40 mg via INTRAVENOUS
  Filled 2020-09-21: qty 40

## 2020-09-21 NOTE — ED Notes (Signed)
DC instructions discussed with pt including new prescription and how to take it. Explained s/s to watch for in case she needs to return. Pt aware to make f/u appointment with her provider. No additional questions at discharge.

## 2020-09-21 NOTE — ED Notes (Signed)
Pt A&Ox3, RR even and nonlabored. States she stopped drinking alcohol in February, used to be tx for HTN but has been able to stop her medication. However, she's had episodes of tachycardia since February and now has external cardiac monitoring for the past month. Has cardiology f/u scheduled on 1st. Yesterday started having SSCP not relieved by OTC medication. States she's had this type of CP before when she had episodes of tachycardiac. CP nonradiating. No numbness or tingling.

## 2020-09-21 NOTE — ED Provider Notes (Signed)
[facility] EMERGENCY DEPARTMENT Provider Note   CSN: [id number] Arrival date & time: 09/21/20  1614     History Chief Complaint  Patient presents with  . Chest Pain    Tina Fletcher is a 35 y.o. female history of CHF, hypertension, migraines, QT prolongation.  Patient presents to the ER today for evaluation of chest pain and headache onset around 7 PM yesterday.  Patient describes burning central chest pain nonradiating constant since onset no aggravating or alleviating factors.  Patient attempted 1 naproxen for symptoms without relief.  Patient reports around the same time last night she developed a frontal headache pressure sensation moderate intensity constant no aggravating or alleviating factors, no radiation of pain.  Patient reports that she has had similar headaches and chest pain in the past but feels it is more severe today than before.  Patient denies fever/chills, fall/injury, vision changes, difficulty speaking, neck pain, cough/hemoptysis, abdominal pain, nausea/vomiting, extremity swelling/color change or any additional concerns.  HPI     Past Medical History:  Diagnosis Date  . Abnormal menstrual periods 02/12/2014  . CHF (congestive heart failure) (HCC)   . Herpes simplex without mention of complication   . Hypertension   . Medical history non-contributory   . Migraines   . Nexplanon insertion 08/13/2013   08/13/13 inserted left arm, remove 08/13/16  . Vitamin D deficiency 04/01/2016    Patient Active Problem List   Diagnosis Date Noted  . Hypertensive crisis 08/18/2020  . Hypokalemia 08/18/2020  . Prolonged QT interval 08/18/2020  . Syncope   . Chronic systolic CHF (congestive heart failure) (HCC)   . Cervical cancer screening 10/23/2019  . Systolic CHF, acute (HCC) 05/10/2019  . Vitamin D deficiency 04/01/2016  . Nexplanon insertion 08/13/2013  . Thrombocytopenia (HCC) 04/17/2013  . 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 Underweight 01/08/2013     Past Surgical History:  Procedure Laterality Date  . RIGHT/LEFT HEART CATH AND CORONARY ANGIOGRAPHY N/A 05/14/2019   Procedure: RIGHT/LEFT HEART CATH AND CORONARY ANGIOGRAPHY;  Surgeon: Laurey Morale, MD;  Location: [facility] INVASIVE CV LAB;  Service: Cardiovascular;  Laterality: N/A;     OB History    Gravida  2   Para  2   Term  2   Preterm      AB      Living  2     SAB      IAB      Ectopic      Multiple      Live Births  2           Family History  Problem Relation Age of Onset  . Diabetes Maternal Grandmother   . Cancer Paternal Grandfather        prancreatic  . COPD Maternal Grandfather   . Cancer Maternal Grandfather        prostate  . Asthma Maternal Grandfather   . Asthma Son   . Bronchitis Son     Social History   Tobacco Use  . Smoking status: Never Smoker  . Smokeless tobacco: Never Used  Vaping Use  . Vaping Use: Never used  Substance Use Topics  . Alcohol use: Yes    Comment: occ  . Drug use: No    Home Medications Prior to Admission medications   Medication Sig Start Date End Date Taking? Authorizing Provider  sucralfate (CARAFATE) 1 GM/10ML suspension Take 10 mLs (1 g total) by mouth 4 (four) times daily -  with meals and at bedtime.  09/21/20  Yes Harlene Salts A, PA-C  acetaminophen (TYLENOL) 325 MG tablet Take 650 mg by mouth every 6 (six) hours as needed for moderate pain.    [provider]  etonogestrel (NEXPLANON) 68 MG IMPL implant Inject 1 each into the skin continuous.    [provider]  ivabradine (CORLANOR) 5 MG TABS tablet Take 0.5 tablets (2.5 mg total) by mouth 2 (two) times daily with a meal. 07/01/20   Laurey Morale, MD  meclizine (ANTIVERT) 25 MG tablet Take 25 mg by mouth 3 (three) times daily as needed for dizziness. 08/23/20 09/22/20  [provider]  megestrol (MEGACE) 20 MG tablet Take 1 tablet (20 mg total) by mouth daily. 09/15/20   Hoy Register, MD  naproxen (NAPROSYN) 500  MG tablet Take 500 mg by mouth 2 (two) times daily as needed.    [provider]  potassium chloride SA (KLOR-CON) 20 MEQ tablet Take 1 tablet (20 mEq total) by mouth daily. 08/22/20   Laurey Morale, MD  sertraline (ZOLOFT) 25 MG tablet Take 1 tablet (25 mg total) by mouth daily. 08/22/20 08/22/21  Laurey Morale, MD    Allergies    Patient has no known allergies.  Review of Systems   Review of Systems Ten systems are reviewed and are negative for acute change except as noted in the HPI   Physical Exam Updated Vital Signs BP 123/83   Pulse 76   Temp 98.4 F (36.9 C) (Oral)   Resp 14   Ht 5\' 9"  (1.753 m)   Wt 54.6 kg   SpO2 100%   BMI 17.77 kg/m   Physical Exam Constitutional:      General: She is not in acute distress.    Appearance: Normal appearance. She is well-developed. She is not ill-appearing or diaphoretic.  HENT:     Head: Normocephalic and atraumatic.     Jaw: There is normal jaw occlusion.     Right Ear: External ear normal.     Left Ear: External ear normal.     Nose: Rhinorrhea present. Rhinorrhea is clear.     Mouth/Throat:     Mouth: Mucous membranes are moist.     Pharynx: Oropharynx is clear.  Eyes:     General: Vision grossly intact. Gaze aligned appropriately.     Pupils: Pupils are equal, round, and reactive to light.  Neck:     Trachea: Trachea and phonation normal.     Meningeal: Brudzinski's sign absent.  Cardiovascular:     Rate and Rhythm: Normal rate and regular rhythm.     Pulses:          Radial pulses are 2+ on the right side and 2+ on the left side.       Dorsalis pedis pulses are 2+ on the right side and 2+ on the left side.  Pulmonary:     Effort: Pulmonary effort is normal. No respiratory distress.  Abdominal:     General: There is no distension.     Palpations: Abdomen is soft.     Tenderness: There is no abdominal tenderness. There is no guarding or rebound.  Musculoskeletal:        General: Normal range of motion.      Cervical back: Normal range of motion and neck supple.     Right lower leg: No tenderness. No edema.     Left lower leg: No tenderness. No edema.  Skin:    General: Skin is warm and  dry.  Neurological:     Mental Status: She is alert.     GCS: GCS eye subscore is 4. GCS verbal subscore is 5. GCS motor subscore is 6.     Comments: Speech is clear and goal oriented, follows commands Major Cranial nerves without deficit, no facial droop Normal strength in upper and lower extremities bilaterally including dorsiflexion and plantar flexion, strong and equal grip strength Sensation normal to light and sharp touch Moves extremities without ataxia, coordination intact Normal finger to nose and rapid alternating movements Neg romberg, no pronator drift Normal gait Normal heel-shin and balance  Psychiatric:        Behavior: Behavior normal.     ED Results / Procedures / Treatments   Labs (all labs ordered are listed, but only abnormal results are displayed) Labs Reviewed  CBC WITH DIFFERENTIAL/PLATELET - Abnormal; Notable for the following components:      Result Value   Hemoglobin 11.5 (*)    HCT 35.4 (*)    All other components within normal limits  HEPATIC FUNCTION PANEL - Abnormal; Notable for the following components:   Alkaline Phosphatase 34 (*)    All other components within normal limits  BASIC METABOLIC PANEL  LIPASE, BLOOD  I-STAT BETA HCG BLOOD, ED ([facility], [facility], [facility] ONLY)  TROPONIN I (HIGH SENSITIVITY)    EKG EKG Interpretation  Date/Time:  Sunday September 21 2020 16:23:10 EDT Ventricular Rate:  87 PR Interval:    QRS Duration: 91 QT Interval:  385 QTC Calculation: 464 R Axis:   58 Text Interpretation: Sinus rhythm Probable left atrial enlargement since last tracing no significant change Confirmed by Eber Hong ([id number]) on 09/21/2020 4:36:46 PM   Radiology DG Chest Portable 1 View  Result Date: 09/21/2020 CLINICAL DATA:  Chest pain starting last night EXAM:  PORTABLE CHEST 1 VIEW COMPARISON:  Portable exam 1702 hours compared to 09/17/2020 FINDINGS: Normal heart size, mediastinal contours, and pulmonary vascularity. Lungs clear. No pulmonary infiltrate, pleural effusion, or pneumothorax. Osseous structures unremarkable. IMPRESSION: No acute abnormalities. Electronically Signed   By: Ulyses Southward M.D.   On: 09/21/2020 17:22    Procedures Procedures   Medications Ordered in ED Medications  alum & mag hydroxide-simeth (MAALOX/MYLANTA) 200-200-20 MG/5ML suspension 30 mL (30 mLs Oral Given 09/21/20 1636)    And  lidocaine (XYLOCAINE) 2 % viscous mouth solution 15 mL (15 mLs Oral Given 09/21/20 1638)  pantoprazole (PROTONIX) injection 40 mg (40 mg Intravenous Given 09/21/20 1656)    ED Course  I have reviewed the triage vital signs and the nursing notes.  Pertinent labs & imaging results that were available during my care of the patient were reviewed by me and considered in my medical decision making (see chart for details).    MDM Rules/Calculators/A&P                         Additional history obtained from: 1. Nursing notes from this visit. 2. Review of electronic medical records.  Patient with several ER visits this month.  Most recent visit 09/17/2020 at [facility] emergency department.  Diagnosis includes atypical chest pain, hypomagnesemia, GERD.  Work-up appears to include negative troponin.  Patient was seen in the ER again on 09/15/2020, diagnosis nonspecific chest pain.  At that time patient had EKG, CMP, CBC, lipase and chest x-ray performed.  It also appears patient is established with cardiology and have ordered a long-term monitor for her. -------------------------- I ordered, reviewed and interpreted labs  which include: CBC shows baseline anemia of 11.5, no thrombocytopenia or leukocytosis. BMP shows no emergent electrolyte derangement, AKI or gap. LFTs show no emergent elevations. Lipase within normal limits. High-sensitivity troponin  negative. Pregnancy test negative.  CXR:  IMPRESSION:  No acute abnormalities.   EKG: Sinus rhythm Probable left atrial enlargement since last tracing no significant change Confirmed by Eber Hong ([id number]) on 09/21/2020 4:36:46 PM ------------ Chest pain resolved, headache improving.  Patient requesting discharge.  Suspect reflux etiology of patient's symptoms today.  Encouraged close PCP and cardiology follow-up.  Patient takes PPI already for reflux disease will add Carafate.  As to patient's headache it is resolving, no neck stiffness, temporal artery or cervical artery pain, syncope, neuro abnormalities or other no red flag symptoms, normal neurologic exam.  Doubt meningitis, SAH, CVA, subdural, mass-effect or other emergent pathologies at this time.  Additionally patient has mild rhinorrhea but no evidence for acute bacterial sinusitis.  No decays for antibiotics or further work-up at this time.  Encouraged water hydration, rest and OTC anti-inflammatories and close PCP follow-up.  At this time there does not appear to be any evidence of an acute emergency medical condition and the patient appears stable for discharge with appropriate outpatient follow up. Diagnosis was discussed with patient who verbalizes understanding of care plan and is agreeable to discharge. I have discussed return precautions with patient who verbalizes understanding. Patient encouraged to follow-up with their PCP and Cardiology. All questions answered.  Patient's case discussed with Dr. Hyacinth Meeker who agrees with plan to discharge with follow-up.   Note: Portions of this report may have been transcribed using voice recognition software. Every effort was made to ensure accuracy; however, inadvertent computerized transcription errors may still be present. Final Clinical Impression(s) / ED Diagnoses Final diagnoses:  Atypical chest pain    Rx / DC Orders ED Discharge Orders         Ordered    sucralfate (CARAFATE) 1  GM/10ML suspension  3 times daily with meals & bedtime        09/21/20 1859           Elizabeth Palau 09/21/20 Lynford Citizen, MD 09/27/20 [phone]

## 2020-09-21 NOTE — ED Triage Notes (Signed)
Pt to the ED with chest pain described as burning in the center beginning last night. Pt states it does not radiate, and denies N/V.  Pt also has a HA.

## 2020-09-21 NOTE — Discharge Instructions (Signed)
At this time there does not appear to be the presence of an emergent medical condition, however there is always the potential for conditions to change. Please read and follow the below instructions.  Please return to the Emergency Department immediately for any new or worsening symptoms. Please be sure to follow up with your Primary Care Provider within one week regarding your visit today; please call their office to schedule an appointment even if you are feeling better for a follow-up visit. You may take the medication Carafate as prescribed to help with acid reflux related symptoms.  Please continue taking your Nexium as prescribed by your primary care provider.  Please call your cardiologist tomorrow morning to schedule a follow-up appointment.  Please drink plenty water and get plenty of rest.  Go to the nearest Emergency Department immediately if: You have fever or chills Your chest pain is worse. You have a cough that gets worse, or you cough up blood. You have very bad (severe) pain in your belly (abdomen). You pass out (faint). You have either of these for no clear reason: Sudden chest discomfort. Sudden discomfort in your arms, back, neck, or jaw. You have shortness of breath at any time. You suddenly start to sweat, or your skin gets clammy. You feel sick to your stomach (nauseous). You throw up (vomit). You suddenly feel lightheaded or dizzy. You feel very weak or tired. Your heart starts to beat fast, or it feels like it is skipping beats. Your headache gets very bad quickly. Your headache gets worse after a lot of physical activity. You keep throwing up. You have a stiff neck. You have trouble seeing. You have trouble speaking. You have pain in the eye or ear. Your muscles are weak or you lose muscle control. You lose your balance or have trouble walking. You feel like you will pass out (faint) or you pass out. You are mixed up (confused). You have a seizure. You have  any new/concerning or worsening of symptoms  Please read the additional information packets attached to your discharge summary.  Do not take your medicine if  develop an itchy rash, swelling in your mouth or lips, or difficulty breathing; call 911 and seek immediate emergency medical attention if this occurs.  You may review your lab tests and imaging results in their entirety on your MyChart account.  Please discuss all results of fully with your primary care provider and other specialist at your follow-up visit.  Note: Portions of this text may have been transcribed using voice recognition software. Every effort was made to ensure accuracy; however, inadvertent computerized transcription errors may still be present.

## 2020-09-22 ENCOUNTER — Telehealth (HOSPITAL_COMMUNITY): Payer: Self-pay

## 2020-09-22 ENCOUNTER — Encounter (HOSPITAL_COMMUNITY): Payer: Self-pay | Admitting: *Deleted

## 2020-09-22 ENCOUNTER — Other Ambulatory Visit: Payer: Self-pay

## 2020-09-22 ENCOUNTER — Emergency Department (HOSPITAL_COMMUNITY): Payer: Medicaid Other

## 2020-09-22 ENCOUNTER — Emergency Department (HOSPITAL_COMMUNITY)
Admission: EM | Admit: 2020-09-22 | Discharge: 2020-09-22 | Disposition: A | Discharge status: Home / Self Care | Payer: Medicaid Other | Attending: Emergency Medicine | Admitting: Emergency Medicine

## 2020-09-22 DIAGNOSIS — I5022 Chronic systolic (congestive) heart failure: Secondary | ICD-10-CM | POA: Diagnosis not present

## 2020-09-22 DIAGNOSIS — R519 Headache, unspecified: Secondary | ICD-10-CM | POA: Diagnosis present

## 2020-09-22 DIAGNOSIS — G43909 Migraine, unspecified, not intractable, without status migrainosus: Secondary | ICD-10-CM | POA: Insufficient documentation

## 2020-09-22 DIAGNOSIS — I110 Hypertensive heart disease with heart failure: Secondary | ICD-10-CM | POA: Diagnosis not present

## 2020-09-22 MED ORDER — KETOROLAC TROMETHAMINE 60 MG/2ML IM SOLN
60.0000 mg | Freq: Once | INTRAMUSCULAR | Status: AC
  Administered 2020-09-22: 60 mg via INTRAMUSCULAR
  Filled 2020-09-22: qty 2

## 2020-09-22 NOTE — ED Provider Notes (Signed)
[facility] EMERGENCY DEPARTMENT Provider Note   CSN: [id number] Arrival date & time: 09/22/20  1651     History No chief complaint on file.   Tina Fletcher is a 35 y.o. female.  The history is provided by the patient. No language interpreter was used.  Headache Pain location:  Generalized Quality:  Unable to specify Timing:  Constant Progression:  Worsening Chronicity:  New Relieved by:  Nothing Worsened by:  Nothing Associated symptoms: no dizziness    Pt reports her blood pressure was elevated today.  Pt reports she has had a migraine without relief from tylenol ibuprofen and otc migraine medications.     Past Medical History:  Diagnosis Date  . Abnormal menstrual periods 02/12/2014  . CHF (congestive heart failure) (HCC)   . Herpes simplex without mention of complication   . Hypertension   . Medical history non-contributory   . Migraines   . Nexplanon insertion 08/13/2013   08/13/13 inserted left arm, remove 08/13/16  . Vitamin D deficiency 04/01/2016    Patient Active Problem List   Diagnosis Date Noted  . Hypertensive crisis 08/18/2020  . Hypokalemia 08/18/2020  . Prolonged QT interval 08/18/2020  . Syncope   . Chronic systolic CHF (congestive heart failure) (HCC)   . Cervical cancer screening 10/23/2019  . Systolic CHF, acute (HCC) 05/10/2019  . Vitamin D deficiency 04/01/2016  . Nexplanon insertion 08/13/2013  . Thrombocytopenia (HCC) 04/17/2013  . 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 Underweight 01/08/2013    Past Surgical History:  Procedure Laterality Date  . RIGHT/LEFT HEART CATH AND CORONARY ANGIOGRAPHY N/A 05/14/2019   Procedure: RIGHT/LEFT HEART CATH AND CORONARY ANGIOGRAPHY;  Surgeon: Laurey Morale, MD;  Location: [facility] INVASIVE CV LAB;  Service: Cardiovascular;  Laterality: N/A;     OB History    Gravida  2   Para  2   Term  2   Preterm      AB      Living  2     SAB      IAB      Ectopic      Multiple      Live Births   2           Family History  Problem Relation Age of Onset  . Diabetes Maternal Grandmother   . Cancer Paternal Grandfather        prancreatic  . COPD Maternal Grandfather   . Cancer Maternal Grandfather        prostate  . Asthma Maternal Grandfather   . Asthma Son   . Bronchitis Son     Social History   Tobacco Use  . Smoking status: Never Smoker  . Smokeless tobacco: Never Used  Vaping Use  . Vaping Use: Never used  Substance Use Topics  . Alcohol use: Yes    Comment: occ  . Drug use: No    Home Medications Prior to Admission medications   Medication Sig Start Date End Date Taking? Authorizing Provider  acetaminophen (TYLENOL) 325 MG tablet Take 650 mg by mouth every 6 (six) hours as needed for moderate pain.    [provider]  etonogestrel (NEXPLANON) 68 MG IMPL implant Inject 1 each into the skin continuous.    [provider]  ivabradine (CORLANOR) 5 MG TABS tablet Take 0.5 tablets (2.5 mg total) by mouth 2 (two) times daily with a meal. 07/01/20   Laurey Morale, MD  meclizine (ANTIVERT) 25 MG tablet Take 25 mg by  mouth 3 (three) times daily as needed for dizziness. 08/23/20 09/22/20  [provider]  megestrol (MEGACE) 20 MG tablet Take 1 tablet (20 mg total) by mouth daily. 09/15/20   Hoy Register, MD  naproxen (NAPROSYN) 500 MG tablet Take 500 mg by mouth 2 (two) times daily as needed.    [provider]  potassium chloride SA (KLOR-CON) 20 MEQ tablet Take 1 tablet (20 mEq total) by mouth daily. 08/22/20   Laurey Morale, MD  sertraline (ZOLOFT) 25 MG tablet Take 1 tablet (25 mg total) by mouth daily. 08/22/20 08/22/21  Laurey Morale, MD  sucralfate (CARAFATE) 1 GM/10ML suspension Take 10 mLs (1 g total) by mouth 4 (four) times daily -  with meals and at bedtime. 09/21/20   Bill Salinas, PA-C    Allergies    Patient has no known allergies.  Review of Systems   Review of Systems  Neurological: Positive for  headaches. Negative for dizziness.  All other systems reviewed and are negative.   Physical Exam Updated Vital Signs BP 117/85   Pulse 98   Temp 98.8 F (37.1 C) (Oral)   Resp 20   SpO2 99%   Physical Exam Vitals and nursing note reviewed.  Constitutional:      Appearance: She is well-developed.  HENT:     Head: Normocephalic.     Mouth/Throat:     Mouth: Mucous membranes are moist.  Eyes:     Pupils: Pupils are equal, round, and reactive to light.  Cardiovascular:     Rate and Rhythm: Normal rate.  Pulmonary:     Effort: Pulmonary effort is normal.  Abdominal:     General: There is no distension.  Musculoskeletal:        General: Normal range of motion.     Cervical back: Normal range of motion.  Skin:    General: Skin is warm.  Neurological:     General: No focal deficit present.     Mental Status: She is alert and oriented to person, place, and time.  Psychiatric:        Mood and Affect: Mood normal.     ED Results / Procedures / Treatments   Labs (all labs ordered are listed, but only abnormal results are displayed) Labs Reviewed - No data to display  EKG None  Radiology DG Chest Portable 1 View  Result Date: 09/21/2020 CLINICAL DATA:  Chest pain starting last night EXAM: PORTABLE CHEST 1 VIEW COMPARISON:  Portable exam 1702 hours compared to 09/17/2020 FINDINGS: Normal heart size, mediastinal contours, and pulmonary vascularity. Lungs clear. No pulmonary infiltrate, pleural effusion, or pneumothorax. Osseous structures unremarkable. IMPRESSION: No acute abnormalities. Electronically Signed   By: Ulyses Southward M.D.   On: 09/21/2020 17:22    Procedures Procedures   Medications Ordered in ED Medications  ketorolac (TORADOL) injection 60 mg (has no administration in time range)    ED Course  I have reviewed the triage vital signs and the nursing notes.  Pertinent labs & imaging results that were available during my care of the patient were reviewed by  me and considered in my medical decision making (see chart for details).    MDM Rules/Calculators/A&P                          MDM: Pt's blood pressure is normal.  Pt given toradol for migraine.  Final Clinical Impression(s) / ED Diagnoses Final diagnoses:  Nonintractable headache,  unspecified chronicity pattern, unspecified headache type    Rx / DC Orders ED Discharge Orders    None    An After Visit Summary was printed and given to the patient.    Elson Areas, [place] 09/22/20 1801    Eber Hong, MD 09/27/20 [phone]

## 2020-09-22 NOTE — ED Triage Notes (Signed)
headache

## 2020-09-22 NOTE — Telephone Encounter (Signed)
Transition Care Management Follow-up Telephone Call  Date of discharge and from where: 09/18/2020 [facility] ED  How have you been since you were released from the hospital? "The same" - pt went to [facility] ED 09/21/2020 for the same symptoms  Any questions or concerns? No  Items Reviewed:  Did the pt receive and understand the discharge instructions provided? Yes   Medications obtained and verified? Yes   Other? No   Any new allergies since your discharge? No   Dietary orders reviewed? No  Do you have support at home? Yes    Functional Questionnaire: (I = Independent and D = Dependent) ADLs: I  Bathing/Dressing- I  Meal Prep- I  Eating- II  Maintaining continence- I  Transferring/Ambulation- I  Managing Meds- I  Follow up appointments reviewed:   PCP Hospital f/u appt confirmed? No    Specialist Hospital f/u appt confirmed? Yes  Scheduled to see Dr. Shirlee Latch on 10/07/2020 @ 1520.  Are transportation arrangements needed? No   If their condition worsens, is the pt aware to call PCP or go to the Emergency Dept.? Yes  Was the patient provided with contact information for the PCP's office or ED? Yes  Was to pt encouraged to call back with questions or concerns? Yes

## 2020-09-22 NOTE — Telephone Encounter (Signed)
Lehua reached out reporting she has had a headache over the last two days with no relief using Exedrin or Tylenol. Zulay reports she has been med compliant and I had her check her BP at her local pharmacy today and it was elevated 146/94. Demetress stated she had taken one of her Amlodipine 5mg  due to her BP being elevated. I advised her I would reach back out to check in on her vitals. Marilene agreed with same and we agreed to talk in 2 hours to follow up.   I spoke to [facility] at 1500 and she said her BP had gone down to 116/80 after Amlodipine and her headache was improving. I advised her I would be out for a home visit tomorrow for follow up. Laticha agreed with plan.

## 2020-09-22 NOTE — Discharge Instructions (Addendum)
Follow up with your Physician as scheduled  

## 2020-09-23 ENCOUNTER — Other Ambulatory Visit (HOSPITAL_COMMUNITY): Payer: Self-pay

## 2020-09-23 NOTE — Progress Notes (Signed)
Paramedicine Encounter    Patient ID: Tina Fletcher, female    DOB: 07-25-1985, 35 y.o.   MRN: [id number]   Patient Care Team: Tina Morale, MD as PCP - General (Cardiology)  Patient Active Problem List   Diagnosis Date Noted  . Hypertensive crisis 08/18/2020  . Hypokalemia 08/18/2020  . Prolonged QT interval 08/18/2020  . Syncope   . Chronic systolic CHF (congestive heart failure) (HCC)   . Cervical cancer screening 10/23/2019  . Systolic CHF, acute (HCC) 05/10/2019  . Vitamin D deficiency 04/01/2016  . Nexplanon insertion 08/13/2013  . Thrombocytopenia (HCC) 04/17/2013  . 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 Underweight 01/08/2013    Current Outpatient Medications:  .  acetaminophen (TYLENOL) 325 MG tablet, Take 650 mg by mouth every 6 (six) hours as needed for moderate pain., Disp: , Rfl:  .  etonogestrel (NEXPLANON) 68 MG IMPL implant, Inject 1 each into the skin continuous., Disp: , Rfl:  .  ivabradine (CORLANOR) 5 MG TABS tablet, Take 0.5 tablets (2.5 mg total) by mouth 2 (two) times daily with a meal., Disp: 90 tablet, Rfl: 3 .  megestrol (MEGACE) 20 MG tablet, Take 1 tablet (20 mg total) by mouth daily., Disp: 30 tablet, Rfl: 0 .  naproxen (NAPROSYN) 500 MG tablet, Take 500 mg by mouth 2 (two) times daily as needed., Disp: , Rfl:  .  potassium chloride SA (KLOR-CON) 20 MEQ tablet, Take 1 tablet (20 mEq total) by mouth daily., Disp: 90 tablet, Rfl: 3 .  sertraline (ZOLOFT) 25 MG tablet, Take 1 tablet (25 mg total) by mouth daily., Disp: 30 tablet, Rfl: 2 .  sucralfate (CARAFATE) 1 GM/10ML suspension, Take 10 mLs (1 g total) by mouth 4 (four) times daily -  with meals and at bedtime., Disp: 420 mL, Rfl: 0 No Known Allergies   Social History   Socioeconomic History  . Marital status: Single    Spouse name: Not on file  . Number of children: Not on file  . Years of education: Not on file  . Highest education level: Not on file  Occupational History  . Not on file   Tobacco Use  . Smoking status: Never Smoker  . Smokeless tobacco: Never Used  Vaping Use  . Vaping Use: Never used  Substance and Sexual Activity  . Alcohol use: Yes    Comment: occ  . Drug use: No  . Sexual activity: Yes    Birth control/protection: Implant  Other Topics Concern  . Not on file  Social History Narrative  . Not on file   Social Determinants of Health   Financial Resource Strain: Low Risk   . Difficulty of Paying Living Expenses: Not hard at all  Food Insecurity: No Food Insecurity  . Worried About Programme researcher, broadcasting/film/video in the Last Year: Never true  . Ran Out of Food in the Last Year: Never true  Transportation Needs: No Transportation Needs  . Lack of Transportation (Medical): No  . Lack of Transportation (Non-Medical): No  Physical Activity: Inactive  . Days of Exercise per Week: 0 days  . Minutes of Exercise per Session: 0 min  Stress: No Stress Concern Present  . Feeling of Stress : Not at all  Social Connections: Moderately Integrated  . Frequency of Communication with Friends and Family: More than three times a week  . Frequency of Social Gatherings with Friends and Family: More than three times a week  . Attends Religious Services: More than 4 times per  year  . Active Member of Clubs or Organizations: No  . Attends Banker Meetings: Never  . Marital Status: Living with partner  Intimate Partner Violence: Not At Risk  . Fear of Current or Ex-Partner: No  . Emotionally Abused: No  . Physically Abused: No  . Sexually Abused: No    Physical Exam Vitals reviewed.  Constitutional:      Appearance: Normal appearance. She is normal weight.  HENT:     Head: Normocephalic.     Nose: Nose normal.     Mouth/Throat:     Mouth: Mucous membranes are moist.     Pharynx: Oropharynx is clear.  Eyes:     Conjunctiva/sclera: Conjunctivae normal.     Pupils: Pupils are equal, round, and reactive to light.  Cardiovascular:     Rate and Rhythm:  Normal rate and regular rhythm.     Pulses: Normal pulses.     Heart sounds: Normal heart sounds.  Pulmonary:     Effort: Pulmonary effort is normal.     Breath sounds: Normal breath sounds.  Abdominal:     General: Abdomen is flat.     Palpations: Abdomen is soft.  Musculoskeletal:        General: Normal range of motion.     Cervical back: Normal range of motion.     Right lower leg: No edema.     Left lower leg: No edema.  Skin:    General: Skin is warm and dry.     Capillary Refill: Capillary refill takes less than 2 seconds.  Neurological:     General: No focal deficit present.     Mental Status: She is alert. Mental status is at baseline.  Psychiatric:        Mood and Affect: Mood normal.      Arrived for home visit for [facility] who reports she is feeling better today and denied any headache or chest pain. Tina Fletcher reports she was seen in the ER last night for headache and chest pain that were not relieved with Tylenol. Tina Fletcher was prompted as to what makes her decide to go to the ER often and she says she gets very anxious when she starts having symptoms and she is fearful of "passing out" in front of her kids so she goes to the ER. I explained to her that there are resources to help with her anxiety. I gave her some lists of same, she has a scheduled televisit with [facility] in April. Tina Fletcher reports she is going to check with [facility] in [place] she says she has gone there before but ended up not following through. Tina Fletcher was advised to reach out when she begins to have these symptoms and if she needed someone to talk to she could reach out to me or LCSW Rosetta Posner. Tina Fletcher reports she was taking her Zoloft in the morning and it was making her sleepy. I advised her to try taking it in the evenings and if it's too much try taking 1/2 tablet. Tina Fletcher agreed with this and was hopeful for our plan. Home visit completed. I will see her in two weeks.   Refills: NONE      Future Appointments  Date Time Provider Department Center  09/30/2020 11:00 AM Adah Salvage, LCSW [facility] None  10/07/2020  3:20 PM Tina Morale, MD [facility] None  10/28/2020  9:30 AM [facility] PA/NP [facility] None  12/23/2020  3:45 PM Duke Salvia, MD [facility]  ACTION: Home visit completed Next visit planned for two weeks

## 2020-09-25 ENCOUNTER — Encounter (HOSPITAL_COMMUNITY): Payer: Self-pay | Admitting: *Deleted

## 2020-09-25 NOTE — Progress Notes (Signed)
Received signed ROI from [facility] requesting last 3 OV notes for upcoming appt pt has with them. Records faxed to them at [phone]

## 2020-09-26 ENCOUNTER — Encounter (HOSPITAL_COMMUNITY): Payer: Self-pay

## 2020-09-27 ENCOUNTER — Telehealth: Payer: Self-pay | Admitting: Physician Assistant

## 2020-09-27 NOTE — Telephone Encounter (Signed)
Patient paged after our answering service due to tachycardia and hypotension.  She has a history of nonischemic cardiomyopathy and orthostatic hypotension.  Previously, in the office, orthostatic vital signs shows her heart rate is in the low 100 range upon standing.  Earlier, while standing at the pharmacy, she had near syncopal episode.  Her systolic blood pressure was 107, heart rate was in the 110s area.  Since then, her symptom has resolved, systolic blood pressure came up to 140s, heart rate dropped down to 100 range.  I recommended she get some rest at home, recheck her blood pressure every 2 hours.  If presyncopal symptoms returns, she will need to seek medical attention at ED.

## 2020-09-28 ENCOUNTER — Encounter (HOSPITAL_COMMUNITY): Payer: Self-pay

## 2020-09-28 ENCOUNTER — Other Ambulatory Visit: Payer: Self-pay

## 2020-09-28 ENCOUNTER — Emergency Department (HOSPITAL_COMMUNITY): Payer: Medicaid Other

## 2020-09-28 ENCOUNTER — Emergency Department (HOSPITAL_COMMUNITY)
Admission: EM | Admit: 2020-09-28 | Discharge: 2020-09-28 | Disposition: A | Discharge status: Home / Self Care | Payer: Medicaid Other | Attending: Emergency Medicine | Admitting: Emergency Medicine

## 2020-09-28 DIAGNOSIS — D649 Anemia, unspecified: Secondary | ICD-10-CM | POA: Diagnosis not present

## 2020-09-28 DIAGNOSIS — K219 Gastro-esophageal reflux disease without esophagitis: Secondary | ICD-10-CM | POA: Diagnosis not present

## 2020-09-28 DIAGNOSIS — R0789 Other chest pain: Secondary | ICD-10-CM | POA: Diagnosis not present

## 2020-09-28 DIAGNOSIS — R079 Chest pain, unspecified: Secondary | ICD-10-CM

## 2020-09-28 DIAGNOSIS — I110 Hypertensive heart disease with heart failure: Secondary | ICD-10-CM | POA: Diagnosis not present

## 2020-09-28 DIAGNOSIS — I5022 Chronic systolic (congestive) heart failure: Secondary | ICD-10-CM | POA: Diagnosis not present

## 2020-09-28 DIAGNOSIS — R000 Tachycardia, unspecified: Secondary | ICD-10-CM | POA: Insufficient documentation

## 2020-09-28 DIAGNOSIS — Z79899 Other long term (current) drug therapy: Secondary | ICD-10-CM | POA: Insufficient documentation

## 2020-09-28 DIAGNOSIS — R4589 Other symptoms and signs involving emotional state: Secondary | ICD-10-CM | POA: Diagnosis not present

## 2020-09-28 LAB — TROPONIN I (HIGH SENSITIVITY)
Troponin I (High Sensitivity): <2 ng/L (ref <18)
Troponin I (High Sensitivity): 2 ng/L (ref <18)

## 2020-09-28 LAB — CBC WITH DIFFERENTIAL/PLATELET
Abs Immature Granulocytes: 0.01 10*3/uL (ref 0.00–0.07)
Basophils Absolute: 0 10*3/uL (ref 0.0–0.1)
Basophils Relative: 1 %
Eosinophils Absolute: 0.1 10*3/uL (ref 0.0–0.5)
Eosinophils Relative: 2 %
HCT: 35.2 % — ABNORMAL LOW (ref 36.0–46.0)
Hemoglobin: 11.7 g/dL — ABNORMAL LOW (ref 12.0–15.0)
Immature Granulocytes: 0 %
Lymphocytes Relative: 38 %
Lymphs Abs: 2.1 10*3/uL (ref 0.7–4.0)
MCH: 29.2 pg (ref 26.0–34.0)
MCHC: 33.2 g/dL (ref 30.0–36.0)
MCV: 87.8 fL (ref 80.0–100.0)
Monocytes Absolute: 0.4 10*3/uL (ref 0.1–1.0)
Monocytes Relative: 8 %
Neutro Abs: 2.9 10*3/uL (ref 1.7–7.7)
Neutrophils Relative %: 51 %
Platelets: 228 10*3/uL (ref 150–400)
RBC: 4.01 MIL/uL (ref 3.87–5.11)
RDW: 12.8 % (ref 11.5–15.5)
WBC: 5.6 10*3/uL (ref 4.0–10.5)
nRBC: 0 % (ref 0.0–0.2)

## 2020-09-28 LAB — BASIC METABOLIC PANEL
Anion gap: 10 (ref 5–15)
BUN: 16 mg/dL (ref 6–20)
CO2: 20 mmol/L — ABNORMAL LOW (ref 22–32)
Calcium: 9.3 mg/dL (ref 8.9–10.3)
Chloride: 104 mmol/L (ref 98–111)
Creatinine, Ser: 0.88 mg/dL (ref 0.44–1.00)
GFR, Estimated: >60 mL/min (ref >60)
Glucose, Bld: 107 mg/dL — ABNORMAL HIGH (ref 70–99)
Potassium: 4.4 mmol/L (ref 3.5–5.1)
Sodium: 134 mmol/L — ABNORMAL LOW (ref 135–145)

## 2020-09-28 LAB — HCG, QUANTITATIVE, PREGNANCY: hCG, Beta Chain, Quant, S: <1 m[IU]/mL (ref <5)

## 2020-09-28 LAB — BRAIN NATRIURETIC PEPTIDE: B Natriuretic Peptide: 14 pg/mL (ref 0.0–100.0)

## 2020-09-28 LAB — TSH: TSH: 1.076 u[IU]/mL (ref 0.350–4.500)

## 2020-09-28 LAB — MAGNESIUM: Magnesium: 2.1 mg/dL (ref 1.7–2.4)

## 2020-09-28 MED ORDER — LIDOCAINE VISCOUS HCL 2 % MT SOLN: 15.0000 mL | Freq: Once | OROMUCOSAL | Status: DC

## 2020-09-28 MED ORDER — ALUM & MAG HYDROXIDE-SIMETH 200-200-20 MG/5ML PO SUSP: 30.0000 mL | Freq: Once | ORAL | Status: DC

## 2020-09-28 MED ORDER — NITROGLYCERIN 0.4 MG SL SUBL
0.4000 mg | SUBLINGUAL_TABLET | Freq: Once | SUBLINGUAL | Status: AC
  Administered 2020-09-28: 0.4 mg via SUBLINGUAL
  Filled 2020-09-28: qty 1

## 2020-09-28 MED ORDER — AMLODIPINE BESYLATE 5 MG PO TABS
5.0000 mg | ORAL_TABLET | Freq: Once | ORAL | Status: AC
  Administered 2020-09-28: 5 mg via ORAL
  Filled 2020-09-28: qty 1

## 2020-09-28 MED ORDER — LIDOCAINE VISCOUS HCL 2 % MT SOLN
15.0000 mL | Freq: Once | OROMUCOSAL | Status: AC
  Administered 2020-09-28: 15 mL via ORAL
  Filled 2020-09-28: qty 15

## 2020-09-28 MED ORDER — ALUM & MAG HYDROXIDE-SIMETH 200-200-20 MG/5ML PO SUSP
30.0000 mL | Freq: Once | ORAL | Status: AC
  Administered 2020-09-28: 30 mL via ORAL
  Filled 2020-09-28: qty 30

## 2020-09-28 NOTE — Discharge Instructions (Signed)
You were seen in the emergency department for your chest pain.  Your physical exam, vital signs, blood work, and imaging studies were very reassuring.  While the exact source of your pain remains unclear, there is not appear to be any emergent problem with your heart or your lungs.  I suspect you had a component of acid reflux contributing to your symptoms.  Please resume taking your Nexium at home daily.  Please call your cardiologist's office first thing tomorrow morning and inform them you were seen in the emergency department and need follow-up tomorrow or Tuesday at the latest.  Return to the emergency department if you develop worsening chest pain, shortness of breath, nausea or vomiting that does not stop, or any other new severe symptoms.

## 2020-09-28 NOTE — ED Provider Notes (Signed)
[facility] EMERGENCY DEPARTMENT Provider Note   CSN: [id number] Arrival date & time: 09/28/20  1645     History Chief Complaint  Patient presents with  . Chest Pain    Tina Fletcher is a 35 y.o. 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  Pain is nonradiating and she denies any shortness of breath or other symptoms.  She states she took 324-hour a milligrams of aspirin at home and was administered 1 nitro tablet by EMS with some relief.  Pain increased to 4/10 where it was 7/10 prior to nitro.  At this time she endorses feeling her heart is racing, states that she felt she was going to pass out when she was at home.  She denies abdominal pain, nausea, vomiting.  Endorses burning sensation in her central chest.  Patient was seen at [facility] yesterday for very similar presentation and underwent a CT angiogram of the chest which was negative for acute intrathoracic abnormality.  She states that she was directed to the emergency department by her cardiologist due to concern for chest pain and rapid heart rate.  Denies any recent prolonged travel or immobilization, she is on hormonal IUD, no history of DVT or malignancy.  Patient states she has seen her cardiologist for this problem in the past and they had her wear a Holter monitor which is being evaluated at this time.  She also endorses to me that she has a history of alcohol abuse and is approximately 4 weeks sober.  She feels she has been more emotional and much more anxious since discontinuing her alcohol usage.  HPI     Past Medical History:  Diagnosis Date  . Abnormal menstrual periods 02/12/2014  . CHF (congestive heart failure) (HCC)   . Herpes simplex without mention of complication   . Hypertension   . Medical history non-contributory   . Migraines   . Nexplanon insertion  08/13/2013   08/13/13 inserted left arm, remove 08/13/16  . Vitamin D deficiency 04/01/2016    Patient Active Problem List   Diagnosis Date Noted  . Hypertensive crisis 08/18/2020  . Hypokalemia 08/18/2020  . Prolonged QT interval 08/18/2020  . Syncope   . Chronic systolic CHF (congestive heart failure) (HCC)   . Cervical cancer screening 10/23/2019  . Systolic CHF, acute (HCC) 05/10/2019  . Vitamin D deficiency 04/01/2016  . Nexplanon insertion 08/13/2013  . Thrombocytopenia (HCC) 04/17/2013  . 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 Underweight 01/08/2013    Past Surgical History:  Procedure Laterality Date  . RIGHT/LEFT HEART CATH AND CORONARY ANGIOGRAPHY N/A 05/14/2019   Procedure: RIGHT/LEFT HEART CATH AND CORONARY ANGIOGRAPHY;  Surgeon: Laurey Morale, MD;  Location: [facility] INVASIVE CV LAB;  Service: Cardiovascular;  Laterality: N/A;     OB History    Gravida  2   Para  2   Term  2   Preterm      AB      Living  2     SAB      IAB      Ectopic      Multiple      Live Births  2           Family History  Problem Relation Age of Onset  . Diabetes Maternal Grandmother   . Cancer Paternal Grandfather        prancreatic  . COPD  Maternal Grandfather   . Cancer Maternal Grandfather        prostate  . Asthma Maternal Grandfather   . Asthma Son   . Bronchitis Son     Social History   Tobacco Use  . Smoking status: Never Smoker  . Smokeless tobacco: Never Used  Vaping Use  . Vaping Use: Never used  Substance Use Topics  . Alcohol use: Not Currently    Comment: occ  . Drug use: No    Home Medications Prior to Admission medications   Medication Sig Start Date End Date Taking? Authorizing Provider  acetaminophen (TYLENOL) 325 MG tablet Take 650 mg by mouth every 6 (six) hours as needed for moderate pain.   Yes [provider]  etonogestrel (NEXPLANON) 68 MG IMPL implant Inject 1 each into the skin continuous.   Yes [provider]   ivabradine (CORLANOR) 5 MG TABS tablet Take 0.5 tablets (2.5 mg total) by mouth 2 (two) times daily with a meal. 07/01/20  Yes Laurey Morale, MD  megestrol (MEGACE) 20 MG tablet Take 1 tablet (20 mg total) by mouth daily. 09/15/20  Yes Hoy Register, MD  sertraline (ZOLOFT) 25 MG tablet Take 1 tablet (25 mg total) by mouth daily. 08/22/20 08/22/21 Yes Laurey Morale, MD  esomeprazole (NEXIUM) 40 MG capsule Take by mouth. Patient not taking: Reported on 09/28/2020 09/18/20 10/18/20  [provider]  naproxen (NAPROSYN) 500 MG tablet Take 500 mg by mouth 2 (two) times daily as needed.    [provider]  potassium chloride SA (KLOR-CON) 20 MEQ tablet Take 1 tablet (20 mEq total) by mouth daily. Patient not taking: Reported on 09/28/2020 08/22/20   Laurey Morale, MD  sucralfate (CARAFATE) 1 GM/10ML suspension Take 10 mLs (1 g total) by mouth 4 (four) times daily -  with meals and at bedtime. Patient not taking: No sig reported 09/21/20   Harlene Salts A, PA-C    Allergies    Patient has no known allergies.  Review of Systems   Review of Systems  Constitutional: Negative for activity change, appetite change, chills, diaphoresis, fatigue and fever.  HENT: Negative.   Eyes: Negative.   Respiratory: Positive for chest tightness. Negative for cough, shortness of breath and wheezing.   Cardiovascular: Positive for chest pain and palpitations. Negative for leg swelling.  Gastrointestinal: Negative.   Genitourinary: Negative.   Musculoskeletal: Negative.   Skin: Negative.   Neurological: Negative.     Physical Exam Updated Vital Signs BP 122/89   Pulse 96   Temp 98.7 F (37.1 C) (Oral)   Resp 20   Ht 5\' 9"  (1.753 m)   Wt 57.6 kg   SpO2 100%   BMI 18.75 kg/m   Physical Exam Vitals and nursing note reviewed.  Constitutional:      Appearance: She is not ill-appearing or toxic-appearing.  HENT:     Head: Normocephalic and atraumatic.     Nose: Nose normal.      Mouth/Throat:     Mouth: Mucous membranes are moist.     Pharynx: Oropharynx is clear. Uvula midline. No oropharyngeal exudate or posterior oropharyngeal erythema.     Tonsils: No tonsillar exudate.  Eyes:     General: Lids are normal. Vision grossly intact.        Right eye: No discharge.        Left eye: No discharge.     Extraocular Movements: Extraocular movements intact.     Conjunctiva/sclera: Conjunctivae normal.  Pupils: Pupils are equal, round, and reactive to light.  Neck:     Trachea: Trachea and phonation normal.  Cardiovascular:     Rate and Rhythm: Regular rhythm. Tachycardia present.     Pulses: Normal pulses.     Heart sounds: Normal heart sounds. No murmur heard.   Pulmonary:     Effort: Pulmonary effort is normal. No tachypnea, bradypnea, accessory muscle usage, prolonged expiration or respiratory distress.     Breath sounds: Normal breath sounds. No wheezing or rales.  Chest:     Chest wall: No mass, lacerations, deformity, swelling, tenderness, crepitus or edema.  Abdominal:     General: Bowel sounds are normal. There is no distension.     Palpations: Abdomen is soft.     Tenderness: There is no abdominal tenderness. There is no right CVA tenderness, left CVA tenderness, guarding or rebound.  Musculoskeletal:        General: No deformity.     Cervical back: Normal range of motion and neck supple. No edema, rigidity or crepitus. No pain with movement, spinous process tenderness or muscular tenderness.     Right lower leg: No edema.     Left lower leg: No edema.  Lymphadenopathy:     Cervical: No cervical adenopathy.  Skin:    General: Skin is warm and dry.  Neurological:     General: No focal deficit present.     Mental Status: She is alert and oriented to person, place, and time. Mental status is at baseline.  Psychiatric:        Mood and Affect: Mood normal. Affect is tearful.     ED Results / Procedures / Treatments   Labs (all labs ordered are  listed, but only abnormal results are displayed) Labs Reviewed  BASIC METABOLIC PANEL - Abnormal; Notable for the following components:      Result Value   Sodium 134 (*)    CO2 20 (*)    Glucose, Bld 107 (*)    All other components within normal limits  CBC WITH DIFFERENTIAL/PLATELET - Abnormal; Notable for the following components:   Hemoglobin 11.7 (*)    HCT 35.2 (*)    All other components within normal limits  BRAIN NATRIURETIC PEPTIDE  TSH  MAGNESIUM  HCG, QUANTITATIVE, PREGNANCY  TROPONIN I (HIGH SENSITIVITY)  TROPONIN I (HIGH SENSITIVITY)    EKG EKG Interpretation  Date/Time:  Sunday September 28 2020 17:11:11 EDT Ventricular Rate:  114 PR Interval:  180 QRS Duration: 80 QT Interval:  331 QTC Calculation: 456 R Axis:   50 Text Interpretation: Sinus tachycardia Borderline repolarization abnormality Baseline wander in lead(s) II III aVF Confirmed by Pricilla Loveless [id number]) on 09/28/2020 8:47:17 PM   Radiology DG Chest Portable 1 View  Result Date: 09/28/2020 CLINICAL DATA:  35 year old female with chest pain EXAM: PORTABLE CHEST 1 VIEW COMPARISON:  Chest radiograph dated 09/27/2020 and CT dated 09/27/2020 FINDINGS: The heart size and mediastinal contours are within normal limits. Both lungs are clear. The visualized skeletal structures are unremarkable. IMPRESSION: No active disease. Electronically Signed   By: Elgie Collard M.D.   On: 09/28/2020 18:18    Procedures Procedures  Medications Ordered in ED Medications  amLODipine (NORVASC) tablet 5 mg (5 mg Oral Given 09/28/20 1754)  nitroGLYCERIN (NITROSTAT) SL tablet 0.4 mg (0.4 mg Sublingual Given 09/28/20 1924)  alum & mag hydroxide-simeth (MAALOX/MYLANTA) 200-200-20 MG/5ML suspension 30 mL (30 mLs Oral Given 09/28/20 2040)    And  lidocaine (XYLOCAINE) 2 %  viscous mouth solution 15 mL (15 mLs Oral Given 09/28/20 2041)    ED Course  I have reviewed the triage vital signs and the nursing notes.  Pertinent labs &  imaging results that were available during my care of the patient were reviewed by me and considered in my medical decision making (see chart for details).    MDM Rules/Calculators/A&P                         35 year old female with history of postinfectious cardiomyopathy who presents to the emergency department for episode of burning chest pain and tachycardia at home.  Sensation of presyncope.  Differential diagnosis for this patient includes but is not limited to ACS, PE, coronary artery dissection, pneumothorax, pneumonia, GERD, anxiety, MSK.  Tachycardic on intake, mildly hypertensive.  Vital signs otherwise normal.  Cardiac exam revealed tachycardia with regular rhythm, pulmonary exam is normal.  Abdominal exam is benign.  Patient is neurovascular tact in all 4 extremities.  She is tearful at time my evaluation for concern of etiology of her symptoms.  While patient is not PERC negative at this time due to tachycardia, she had normal CT angiogram of the chest yesterday which was negative for pulmonary embolus.  CBC with anemia, stable hemoglobin 11.5.  BMP unremarkable, BNP negative   troponin negative.  Chest x-ray negative for acute cardiopulmonary disease.  EKG with sinus tachycardia, no STEMI.  Patient ministered nitroglycerin during new episode of chest pain, without improvement.  GI cocktail administered with some improvement patient symptoms of there is residual central lower chest pressure.  Delta troponin negative, 2.  TSH is normal and magnesium is normal.  While the exact cause of this patient's symptoms remains unclear, there does not appear to be any emergent cardiac issue.  There are no arrhythmias noted on her EKG and there are no electrolyte derangements or thyroid changes to explain her tachycardia.  Given thorough normal work-up today and [facility], do not feel any further work-up is warranted in ED  at this time.  After extensive discussion with patient she did reveal that she has discontinued taking her GERD medication; she states that she has been experiencing chest pain similar to today's intermittently since she discontinued her PPI.  It appears that this patient's anxiety is contributing significantly to her presentation today, as she can frequently be seen turning and staring at her monitor with associated tachycardia during these times.  Anxiety is likely exacerbated due to newly sober status.  No further work-up is warranted in the ED at this time, feel patient is safe to be discharged home; she has been instructed to resume taking PPI.  She has full prescription at home.  Recommend she call her cardiologist, Dr. Shirlee LatchMcLean first thing tomorrow morning for follow-up.  Zonnie voiced understanding of her medical evaluation and treatment plan.  Each of her questions was answered to her expressed satisfaction.  Strict return precautions given.  Patient is well-appearing, stable, and appropriate for discharge at this time.  This chart was dictated using voice recognition software, Dragon. Despite the best efforts of this provider to proofread and correct errors, errors may still occur which can change documentation meaning.  Final Clinical Impression(s) / ED Diagnoses Final diagnoses:  Chest pain, unspecified type    Rx / DC Orders ED Discharge Orders    None       Paris LoreSponseller, Anitria Andon R, PA-C 09/29/20  1545    Pricilla Loveless, MD 10/01/20 [phone]

## 2020-09-28 NOTE — ED Triage Notes (Signed)
EMS reports that patient developed burning sensation in chest that started 45 minutes PTA. Non-radiating. Denies any other symptoms. Patient took 324mg  ASA PTA. One nitro tablet administered PTA by EMS. Effective, pain decreased from 7 to 4.

## 2020-09-29 ENCOUNTER — Telehealth (HOSPITAL_COMMUNITY): Payer: Self-pay

## 2020-09-29 ENCOUNTER — Encounter (HOSPITAL_COMMUNITY): Payer: Self-pay

## 2020-09-29 ENCOUNTER — Emergency Department (HOSPITAL_COMMUNITY)
Admission: EM | Admit: 2020-09-29 | Discharge: 2020-09-29 | Disposition: A | Discharge status: Home / Self Care | Payer: Medicaid Other | Attending: Emergency Medicine | Admitting: Emergency Medicine

## 2020-09-29 ENCOUNTER — Ambulatory Visit (HOSPITAL_COMMUNITY): Payer: Medicaid Other

## 2020-09-29 ENCOUNTER — Other Ambulatory Visit: Payer: Self-pay

## 2020-09-29 ENCOUNTER — Telehealth (HOSPITAL_COMMUNITY): Payer: Self-pay | Admitting: Vascular Surgery

## 2020-09-29 ENCOUNTER — Emergency Department (HOSPITAL_COMMUNITY): Payer: Medicaid Other

## 2020-09-29 DIAGNOSIS — F1390 Sedative, hypnotic, or anxiolytic use, unspecified, uncomplicated: Secondary | ICD-10-CM | POA: Diagnosis not present

## 2020-09-29 DIAGNOSIS — R079 Chest pain, unspecified: Secondary | ICD-10-CM | POA: Diagnosis present

## 2020-09-29 DIAGNOSIS — Z79899 Other long term (current) drug therapy: Secondary | ICD-10-CM | POA: Diagnosis not present

## 2020-09-29 DIAGNOSIS — R000 Tachycardia, unspecified: Secondary | ICD-10-CM | POA: Insufficient documentation

## 2020-09-29 DIAGNOSIS — I110 Hypertensive heart disease with heart failure: Secondary | ICD-10-CM | POA: Insufficient documentation

## 2020-09-29 DIAGNOSIS — I10 Essential (primary) hypertension: Secondary | ICD-10-CM

## 2020-09-29 DIAGNOSIS — I5022 Chronic systolic (congestive) heart failure: Secondary | ICD-10-CM | POA: Insufficient documentation

## 2020-09-29 LAB — RAPID URINE DRUG SCREEN, HOSP PERFORMED
Amphetamines: NOT DETECTED
Barbiturates: NOT DETECTED
Benzodiazepines: POSITIVE — AB
Cocaine: NOT DETECTED
Opiates: NOT DETECTED
Tetrahydrocannabinol: NOT DETECTED

## 2020-09-29 LAB — CBC
HCT: 38.8 % (ref 36.0–46.0)
Hemoglobin: 12.6 g/dL (ref 12.0–15.0)
MCH: 28.7 pg (ref 26.0–34.0)
MCHC: 32.5 g/dL (ref 30.0–36.0)
MCV: 88.4 fL (ref 80.0–100.0)
Platelets: 231 10*3/uL (ref 150–400)
RBC: 4.39 MIL/uL (ref 3.87–5.11)
RDW: 12.5 % (ref 11.5–15.5)
WBC: 5.3 10*3/uL (ref 4.0–10.5)
nRBC: 0 % (ref 0.0–0.2)

## 2020-09-29 LAB — BASIC METABOLIC PANEL
Anion gap: 10 (ref 5–15)
BUN: 12 mg/dL (ref 6–20)
CO2: 19 mmol/L — ABNORMAL LOW (ref 22–32)
Calcium: 9.7 mg/dL (ref 8.9–10.3)
Chloride: 106 mmol/L (ref 98–111)
Creatinine, Ser: 0.77 mg/dL (ref 0.44–1.00)
GFR, Estimated: >60 mL/min (ref >60)
Glucose, Bld: 87 mg/dL (ref 70–99)
Potassium: 3.8 mmol/L (ref 3.5–5.1)
Sodium: 135 mmol/L (ref 135–145)

## 2020-09-29 LAB — HEPATIC FUNCTION PANEL
ALT: 22 U/L (ref 0–44)
AST: 19 U/L (ref 15–41)
Albumin: 5 g/dL (ref 3.5–5.0)
Alkaline Phosphatase: 39 U/L (ref 38–126)
Bilirubin, Direct: 0.1 mg/dL (ref 0.0–0.2)
Indirect Bilirubin: 0.8 mg/dL (ref 0.3–0.9)
Total Bilirubin: 0.9 mg/dL (ref 0.3–1.2)
Total Protein: 8.2 g/dL — ABNORMAL HIGH (ref 6.5–8.1)

## 2020-09-29 LAB — TROPONIN I (HIGH SENSITIVITY)
Troponin I (High Sensitivity): <2 ng/L (ref <18)
Troponin I (High Sensitivity): <2 ng/L (ref <18)

## 2020-09-29 LAB — LACTIC ACID, PLASMA: Lactic Acid, Venous: 1.6 mmol/L (ref 0.5–1.9)

## 2020-09-29 LAB — I-STAT BETA HCG BLOOD, ED (MC, WL, AP ONLY): I-stat hCG, quantitative: <5 m[IU]/mL (ref <5)

## 2020-09-29 LAB — BRAIN NATRIURETIC PEPTIDE: B Natriuretic Peptide: 11.4 pg/mL (ref 0.0–100.0)

## 2020-09-29 MED ORDER — AMLODIPINE BESYLATE 5 MG PO TABS
2.5000 mg | ORAL_TABLET | Freq: Once | ORAL | Status: AC
  Administered 2020-09-29: 2.5 mg via ORAL
  Filled 2020-09-29: qty 1

## 2020-09-29 MED ORDER — FENTANYL CITRATE (PF) 100 MCG/2ML IJ SOLN
50.0000 ug | Freq: Once | INTRAMUSCULAR | Status: AC
  Administered 2020-09-29: 50 ug via INTRAVENOUS
  Filled 2020-09-29: qty 2

## 2020-09-29 MED ORDER — SODIUM CHLORIDE 0.9 % IV BOLUS
1000.0000 mL | Freq: Once | INTRAVENOUS | Status: AC
  Administered 2020-09-29: 1000 mL via INTRAVENOUS

## 2020-09-29 NOTE — Telephone Encounter (Signed)
Niti reached out to me this morning reporting she was seen in the ED several times over the weekend for elevated BP and HR. She reports she was instructed to take her old dose of her Amlodipine by the EDP. She reached out to me reporting her heart rate is 114 and her blood pressure is 128/90 and she is having recurrent mild chest pain. Please have Dr. Shirlee Latch advise on amlodipine and if she can be seen any sooner than next week. Thanks!

## 2020-09-29 NOTE — ED Notes (Signed)
2nd Paged sent to on call cardiologist

## 2020-09-29 NOTE — ED Notes (Signed)
Patient has a urine sample and culture in the main lab 

## 2020-09-29 NOTE — ED Notes (Signed)
RN attempted IV x 2, able to obtain blood, but unable to get access, IV team consulted and provider made aware

## 2020-09-29 NOTE — ED Notes (Signed)
Pt verbalized understanding of d/c and follow up care. Ambulatory with steady gait.  

## 2020-09-29 NOTE — ED Provider Notes (Signed)
Tina Fletcher Provider Note   CSN: [id number] Arrival date & time: 09/29/20  1521     History Chief Complaint  Patient presents with  . Chest Pain    Tina Fletcher is a 35 y.o. female.  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  She had an EF of 15% at her nadir, and echo performed about 3 months ago revealed an EF of 40 to 45%.  She has been to the ED numerous times since the start of the year for various complaints, but this is her third ED visit in the past 3 days for chest pain.  She has been evaluated for ACS and PE.  She presents stating that she is still experiencing chest pain.  She also has exercise intolerance.  Recently she was taken off her blood pressure medication for periods of hypotension, and she has noted that her blood pressure has been elevated at home.  She has mainly had diastolic hypertension with pressures around 100 diastolic.  The history is provided by the patient.  Chest Pain Pain location:  L chest Pain quality: sharp   Pain radiates to:  Does not radiate Pain severity:  Moderate Onset quality:  Sudden Duration:  3 days Timing:  Intermittent Progression:  Waxing and waning Chronicity:  Recurrent Context: at rest   Relieved by:  Nothing Worsened by:  Nothing Ineffective treatments:  None tried Associated symptoms: near-syncope   Associated symptoms: no abdominal pain, no back pain, no cough, no diaphoresis, no fever, no lower extremity edema, no palpitations, no shortness of breath and no vomiting   Risk factors: coronary artery disease and hypertension   Risk factors: no prior DVT/PE        Past Medical History:  Diagnosis Date  . Abnormal menstrual periods 02/12/2014  . CHF (congestive heart failure) (HCC)   . Herpes simplex without mention of complication   . Hypertension   . Medical history non-contributory   . Migraines   . Nexplanon  insertion 08/13/2013   08/13/13 inserted left arm, remove 08/13/16  . Vitamin D deficiency 04/01/2016    Patient Active Problem List   Diagnosis Date Noted  . Hypertensive crisis 08/18/2020  . Hypokalemia 08/18/2020  . Prolonged QT interval 08/18/2020  . Syncope   . Chronic systolic CHF (congestive heart failure) (HCC)   . Cervical cancer screening 10/23/2019  . Systolic CHF, acute (HCC) 05/10/2019  . Vitamin D deficiency 04/01/2016  . Nexplanon insertion 08/13/2013  . Thrombocytopenia (HCC) 04/17/2013  . 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 Underweight 01/08/2013    Past Surgical History:  Procedure Laterality Date  . RIGHT/LEFT HEART CATH AND CORONARY ANGIOGRAPHY N/A 05/14/2019   Procedure: RIGHT/LEFT HEART CATH AND CORONARY ANGIOGRAPHY;  Surgeon: Laurey Morale, MD;  Location: [facility] INVASIVE CV LAB;  Service: Cardiovascular;  Laterality: N/A;     OB History    Gravida  2   Para  2   Term  2   Preterm      AB      Living  2     SAB      IAB      Ectopic      Multiple      Live Births  2           Family History  Problem Relation Age of Onset  . Diabetes Maternal Grandmother   . Cancer Paternal Grandfather  prancreatic  . COPD Maternal Grandfather   . Cancer Maternal Grandfather        prostate  . Asthma Maternal Grandfather   . Asthma Son   . Bronchitis Son     Social History   Tobacco Use  . Smoking status: Never Smoker  . Smokeless tobacco: Never Used  Vaping Use  . Vaping Use: Never used  Substance Use Topics  . Alcohol use: Not Currently    Comment: occ  . Drug use: No    Home Medications Prior to Admission medications   Medication Sig Start Date End Date Taking? Authorizing Provider  acetaminophen (TYLENOL) 325 MG tablet Take 650 mg by mouth every 6 (six) hours as needed for moderate pain.    [provider]  esomeprazole (NEXIUM) 40 MG capsule Take by mouth. Patient not taking: Reported on 09/28/2020 09/18/20 10/18/20   [provider]  etonogestrel (NEXPLANON) 68 MG IMPL implant Inject 1 each into the skin continuous.    [provider]  ivabradine (CORLANOR) 5 MG TABS tablet Take 0.5 tablets (2.5 mg total) by mouth 2 (two) times daily with a meal. 07/01/20   Laurey Morale, MD  megestrol (MEGACE) 20 MG tablet Take 1 tablet (20 mg total) by mouth daily. 09/15/20   Hoy Register, MD  naproxen (NAPROSYN) 500 MG tablet Take 500 mg by mouth 2 (two) times daily as needed.    [provider]  potassium chloride SA (KLOR-CON) 20 MEQ tablet Take 1 tablet (20 mEq total) by mouth daily. Patient not taking: Reported on 09/28/2020 08/22/20   Laurey Morale, MD  sertraline (ZOLOFT) 25 MG tablet Take 1 tablet (25 mg total) by mouth daily. 08/22/20 08/22/21  Laurey Morale, MD  sucralfate (CARAFATE) 1 GM/10ML suspension Take 10 mLs (1 g total) by mouth 4 (four) times daily -  with meals and at bedtime. Patient not taking: No sig reported 09/21/20   Harlene Salts A, PA-C    Allergies    Patient has no known allergies.  Review of Systems   Review of Systems  Constitutional: Positive for appetite change and unexpected weight change. Negative for chills, diaphoresis and fever.       Poor appetite, losing weight  HENT: Negative for ear pain and sore throat.   Eyes: Negative for pain and visual disturbance.  Respiratory: Negative for cough and shortness of breath.   Cardiovascular: Positive for chest pain and near-syncope. Negative for palpitations.  Gastrointestinal: Negative for abdominal pain and vomiting.  Genitourinary: Negative for dysuria and hematuria.  Musculoskeletal: Negative for arthralgias and back pain.  Skin: Negative for color change and rash.  Neurological: Negative for seizures and syncope.  All other systems reviewed and are negative.   Physical Exam Updated Vital Signs BP (!) 136/102   Pulse (!) 106   Temp 98.1 F (36.7 C) (Oral)   Resp 18   Ht 5\' 9"  (1.753 m)   Wt  57.6 kg   SpO2 100%   BMI 18.75 kg/m   Physical Exam Vitals and nursing note reviewed.  Constitutional:      General: She is not in acute distress.    Appearance: She is well-developed. She is ill-appearing.     Comments: Very thin, tearful  HENT:     Head: Normocephalic and atraumatic.  Eyes:     Conjunctiva/sclera: Conjunctivae normal.  Cardiovascular:     Rate and Rhythm: Regular rhythm. Tachycardia present.     Heart sounds: No murmur heard.  Pulmonary:     Effort: Pulmonary effort is normal. No respiratory distress.     Breath sounds: Normal breath sounds.  Abdominal:     Palpations: Abdomen is soft.     Tenderness: There is no abdominal tenderness.  Musculoskeletal:     Cervical back: Neck supple.  Skin:    General: Skin is warm and dry.  Neurological:     General: No focal deficit present.     Mental Status: She is alert.  Psychiatric:     Comments: Appears sad     ED Results / Procedures / Treatments   Labs (all labs ordered are listed, but only abnormal results are displayed) Labs Reviewed  BASIC METABOLIC PANEL - Abnormal; Notable for the following components:      Result Value   CO2 19 (*)    All other components within normal limits  HEPATIC FUNCTION PANEL - Abnormal; Notable for the following components:   Total Protein 8.2 (*)    All other components within normal limits  RAPID URINE DRUG SCREEN, HOSP PERFORMED - Abnormal; Notable for the following components:   Benzodiazepines POSITIVE (*)    All other components within normal limits  CBC  BRAIN NATRIURETIC PEPTIDE  LACTIC ACID, PLASMA  I-STAT BETA HCG BLOOD, ED ([facility], [facility], [facility] ONLY)  TROPONIN I (HIGH SENSITIVITY)  TROPONIN I (HIGH SENSITIVITY)    EKG EKG Interpretation  Date/Time:  Monday September 29 2020 15:33:33 EDT Ventricular Rate:  110 PR Interval:  181 QRS Duration: 87 QT Interval:  322 QTC Calculation: 436 R Axis:   69 Text Interpretation: Sinus tachycardia Right atrial  enlargement Borderline repolarization abnormality: ST depression in inferolateral leads slightly worse than last previous 09/28/20 and definitely worse than recent EKGs prior to 09/28/20 12 Lead; Mason-Likar Confirmed by Pieter Partridge ([id number]) on 09/29/2020 5:17:42 PM   Radiology DG Chest 2 View  Result Date: 09/29/2020 CLINICAL DATA:  Chest pain EXAM: CHEST - 2 VIEW COMPARISON:  One-view chest x-ray 09/28/2020 FINDINGS: The heart size and mediastinal contours are within normal limits. Both lungs are clear. The visualized skeletal structures are unremarkable. IMPRESSION: Negative two view chest x-ray Electronically Signed   By: Marin Roberts M.D.   On: 09/29/2020 16:11   DG Chest Portable 1 View  Result Date: 09/28/2020 CLINICAL DATA:  35 year old female with chest pain EXAM: PORTABLE CHEST 1 VIEW COMPARISON:  Chest radiograph dated 09/27/2020 and CT dated 09/27/2020 FINDINGS: The heart size and mediastinal contours are within normal limits. Both lungs are clear. The visualized skeletal structures are unremarkable. IMPRESSION: No active disease. Electronically Signed   By: Elgie Collard M.D.   On: 09/28/2020 18:18    Procedures Procedures   Medications Ordered in ED Medications  sodium chloride 0.9 % bolus 1,000 mL (has no administration in time range)    ED Course  I have reviewed the triage vital signs and the nursing notes.  Pertinent labs & imaging results that were available during my care of the patient were reviewed by me and considered in my medical decision making (see chart for details).  Clinical Course as of 09/29/20 2123  Mon Sep 29, 2020  2035 I spoke with cardiology. She can start amlodipine 2.5 mg daily. [AW]    Clinical Course User Index [AW] Koleen Distance, MD   MDM Rules/Calculators/A&P                          Durene Cal is  35 years old with a history of cardiomyopathy, EF of 40 to 45%.  She also has a past history of alcohol use disorder, and she has  not had any alcohol in several months.  She presents with chest pain and hypertension.  She was also noted to be tachycardic, and I think this is secondary to dehydration.  She admits to lack of appetite, and she has had an issue with adequate nutrition in the past.  She is also had numerous ED visits including 3 in the past 3 days.  I spoke with her at length, and reassured her about her normal work-up.  As recorded above, cardiology was on board with resuming amlodipine at a reduced dose.  However, I ultimately asked the patient if she was suffering from anxiety or other mental health concern.  She did state that this was a significant factor in her life.  She is currently established with [facility] and is seeing a therapist as well as other doctors.  She does feel good about this process, and she denied needing any additional resources.  She has been assigned a case Production designer, theatre/television/film, and she does have resources.  I reassured her that even though her tests are relatively normal, she does have a reduced EF.  It is possible that she will experience some symptoms on a day-to-day basis that will somewhat limit her activities.  I empathized that this could definitely affect her life as her heart is not functioning as a typical 35 year old heart.  Ultimately, she has had numerous recent thorough work-ups, and I think she is stable for discharge home. Final Clinical Impression(s) / ED Diagnoses Final diagnoses:  Primary hypertension  Chronic systolic CHF (congestive heart failure) (HCC)  Chest pain, unspecified type    Rx / DC Orders ED Discharge Orders    None       Koleen Distance, MD 09/29/20 2126

## 2020-09-29 NOTE — Telephone Encounter (Signed)
Per Dr.McLean he does not think this a heart failure issue. No changes to meds right now.Dr.McLean will contact Dr.Klein to see if pt can be seen before June. Pt currently at [facility] emergency room.

## 2020-09-29 NOTE — ED Triage Notes (Addendum)
Patient states she has been having chest pain x 3 days. Patient states she has been to the ED at [facility] a couple of times for the same. Patient states she went to see her cardiologist and states she was told to come to the ED.  Patient states her doctor's took her off of her BP meds because she started drinking alcohol daily, but states she quit.

## 2020-09-29 NOTE — Telephone Encounter (Signed)
Pt at [facility] emergency room per Geraldine Solar.

## 2020-09-29 NOTE — Telephone Encounter (Signed)
Pt called stating she was in the hosp Friday, Sat and Sun with high bp and high HR.Tina Fletcher She states she needs to be seen today not later than tomorrow .. please advise

## 2020-09-30 ENCOUNTER — Ambulatory Visit: Payer: Self-pay | Admitting: *Deleted

## 2020-09-30 ENCOUNTER — Telehealth (HOSPITAL_COMMUNITY): Payer: Self-pay | Admitting: Psychiatry

## 2020-09-30 ENCOUNTER — Other Ambulatory Visit: Payer: Self-pay

## 2020-09-30 ENCOUNTER — Ambulatory Visit (HOSPITAL_COMMUNITY): Payer: Medicaid Other | Admitting: Psychiatry

## 2020-09-30 ENCOUNTER — Emergency Department (HOSPITAL_COMMUNITY): Payer: Medicaid Other

## 2020-09-30 ENCOUNTER — Encounter (HOSPITAL_COMMUNITY): Payer: Self-pay | Admitting: Emergency Medicine

## 2020-09-30 ENCOUNTER — Telehealth: Payer: Self-pay | Admitting: *Deleted

## 2020-09-30 ENCOUNTER — Emergency Department (HOSPITAL_COMMUNITY)
Admission: EM | Admit: 2020-09-30 | Discharge: 2020-09-30 | Disposition: A | Discharge status: Home / Self Care | Payer: Medicaid Other | Attending: Emergency Medicine | Admitting: Emergency Medicine

## 2020-09-30 ENCOUNTER — Telehealth (HOSPITAL_COMMUNITY): Payer: Self-pay

## 2020-09-30 DIAGNOSIS — R519 Headache, unspecified: Secondary | ICD-10-CM | POA: Diagnosis not present

## 2020-09-30 DIAGNOSIS — Z79899 Other long term (current) drug therapy: Secondary | ICD-10-CM | POA: Insufficient documentation

## 2020-09-30 DIAGNOSIS — I5022 Chronic systolic (congestive) heart failure: Secondary | ICD-10-CM | POA: Insufficient documentation

## 2020-09-30 DIAGNOSIS — R0789 Other chest pain: Secondary | ICD-10-CM | POA: Insufficient documentation

## 2020-09-30 DIAGNOSIS — Z955 Presence of coronary angioplasty implant and graft: Secondary | ICD-10-CM | POA: Diagnosis not present

## 2020-09-30 DIAGNOSIS — I110 Hypertensive heart disease with heart failure: Secondary | ICD-10-CM | POA: Diagnosis not present

## 2020-09-30 LAB — TROPONIN I (HIGH SENSITIVITY): Troponin I (High Sensitivity): 2 ng/L (ref <18)

## 2020-09-30 MED ORDER — KETOROLAC TROMETHAMINE 30 MG/ML IJ SOLN
30.0000 mg | Freq: Once | INTRAMUSCULAR | Status: AC
  Administered 2020-09-30: 30 mg via INTRAVENOUS
  Filled 2020-09-30: qty 1

## 2020-09-30 NOTE — Telephone Encounter (Signed)
'  [name]' Scientist, clinical (histocompatibility and immunogenetics) for [facility]" calling in regards to Pt. States she has been following for several months now for C/O CP, BP, HR and anxiety. States pt walked in to Dr. Kathlyn Sacramento office yesterday, spoke to SW, CM. BP checked at that time , "OK." States also reviewed medications. States pt still takes Amlodipine which had been discontinued by Dr. Shirlee Latch. States Dr. Shirlee Latch referred pt to Dr. Graciela Husbands as he felt heart failure wise he had managed all he could for her. States he feels pt may have POTS Syndrome. Pt does not have appt with Dr. Graciela Husbands until May. Please advise regarding earlier appt.with PCP. Please see earlier encounters.  [name] states PCP may reach out to her if needed.

## 2020-09-30 NOTE — ED Triage Notes (Signed)
Pt c/o chest pain, emesis and high blood pressure. Pt was seen for headache earlier at [facility].

## 2020-09-30 NOTE — Telephone Encounter (Signed)
Pt reports chest pain since Friday. Has been to ED x 3 in 24 hours. ED notes multiple extensive work ups, all negative. H/O SCHF.  States last ED visit was told to contact PCP to be seen ASAP. Pt reports CP, mid chest, comes and goes, duration 1 minute, occurring frequently.Rates at 9/10.  Does not radiate. Reports dizziness and nausea. When occurs. Also reports HR elevated 120-150 "In ED." States "Walked in to cardiologist office, they just talked to me, didn't do a thing." Attempted to reach practice, unable to do so. Assured pt NT would route to practice for PCPs review and final disposition. Pt states "I'm going back to hospital. I don't feel good."  CB# [phone]  Reason for Disposition . [1] Chest pain (or "angina") comes and goes AND [2] is happening more often (increasing in frequency) or getting worse (increasing in severity) (Exception: chest pains that last only a few seconds)  Answer Assessment - Initial Assessment Questions 1. LOCATION: "Where does it hurt?"       Middle 2. RADIATION: "Does the pain go anywhere else?" (e.g., into neck, jaw, arms, back)     no 3. ONSET: "When did the chest pain begin?" (Minutes, hours or days)      Friday 4. PATTERN "Does the pain come and go, or has it been constant since it started?"  "Does it get worse with exertion?"      Comes and goes 5. DURATION: "How long does it last" (e.g., seconds, minutes, hours)     1 minute, occurs frequently 6. SEVERITY: "How bad is the pain?"  (e.g., Scale 1-10; mild, moderate, or severe)    - MILD (1-3): doesn't interfere with normal activities     - MODERATE (4-7): interferes with normal activities or awakens from sleep    - SEVERE (8-10): excruciating pain, unable to do any normal activities       9/10 7. CARDIAC RISK FACTORS: "Do you have any history of heart problems or risk factors for heart disease?" (e.g., angina, prior heart attack; diabetes, high blood pressure, high cholesterol, smoker, or strong family  history of heart disease)     CHF 8. PULMONARY RISK FACTORS: "Do you have any history of lung disease?"  (e.g., blood clots in lung, asthma, emphysema, birth control pills)     no 9. CAUSE: "What do you think is causing the chest pain?"     Unsure 10. OTHER SYMPTOMS: "Do you have any other symptoms?" (e.g., dizziness, nausea, vomiting, sweating, fever, difficulty breathing, cough)       Dizziness and nausea when occurs. HR 115-150 in ED  Protocols used: CHEST PAIN-A-AH

## 2020-09-30 NOTE — Discharge Instructions (Addendum)
Follow-up with your cardiologist in the next few days.  Continue medications as previously recommended.

## 2020-09-30 NOTE — Telephone Encounter (Signed)
Therapist contacted patient via video through caregility platform for scheduled appointment.  Patient is in the hospital.  She and therapist agreed to cancel this appointment and reschedule.

## 2020-09-30 NOTE — Telephone Encounter (Signed)
Spoke to Kimball who reports she was seen at [facility] ER for symptoms of headache, chest pain and high heart rate and was discharged. Izzabell has a scheduled tele-visit with NP [facility] tomorrow afternoon, an appointment with [facility] tomorrow morning at 11 and a follow up with Dr. Shirlee Latch next Tuesday. I will continue to talk with Jayelle and assist in any way I can. Brenna was thankful for this phone call to discuss upcoming appointments and her concerns. Call complete. I will continue to follow.

## 2020-09-30 NOTE — Telephone Encounter (Signed)
Pt has been contacted by Herbert Seta EMT and informed of upcoming appointments.

## 2020-09-30 NOTE — ED Provider Notes (Signed)
[facility] EMERGENCY DEPARTMENT Provider Note   CSN: [id number] Arrival date & time: 09/30/20  3382     History Chief Complaint  Patient presents with  . Chest Pain    Tina Fletcher is a 35 y.o. female.  Patient is a 35 year old female with history of congestive heart failure, hypertension.  Patient presents today for evaluation of chest discomfort and headache.  This is her third ER visit in the last 24 hours.  She was just discharged hours ago from [facility] with similar complaints.  Her work-up was unremarkable and she was sent home.  Patient tells me she has pressure behind her eyes and feels discomfort in her chest.  She has undergone multiple extensive work-ups for the same.  She follows with Dr. Shirlee Latch from cardiology.  The history is provided by the patient.       Past Medical History:  Diagnosis Date  . Abnormal menstrual periods 02/12/2014  . CHF (congestive heart failure) (HCC)   . Herpes simplex without mention of complication   . Hypertension   . Medical history non-contributory   . Migraines   . Nexplanon insertion 08/13/2013   08/13/13 inserted left arm, remove 08/13/16  . Vitamin D deficiency 04/01/2016    Patient Active Problem List   Diagnosis Date Noted  . Hypertensive crisis 08/18/2020  . Hypokalemia 08/18/2020  . Prolonged QT interval 08/18/2020  . Syncope   . Chronic systolic CHF (congestive heart failure) (HCC)   . Cervical cancer screening 10/23/2019  . Systolic CHF, acute (HCC) 05/10/2019  . Vitamin D deficiency 04/01/2016  . Nexplanon insertion 08/13/2013  . Thrombocytopenia (HCC) 04/17/2013  . 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 Underweight 01/08/2013    Past Surgical History:  Procedure Laterality Date  . RIGHT/LEFT HEART CATH AND CORONARY ANGIOGRAPHY N/A 05/14/2019   Procedure: RIGHT/LEFT HEART CATH AND CORONARY ANGIOGRAPHY;  Surgeon: Laurey Morale, MD;  Location: [facility] INVASIVE CV LAB;  Service: Cardiovascular;  Laterality: N/A;      OB History    Gravida  2   Para  2   Term  2   Preterm      AB      Living  2     SAB      IAB      Ectopic      Multiple      Live Births  2           Family History  Problem Relation Age of Onset  . Diabetes Maternal Grandmother   . Cancer Paternal Grandfather        prancreatic  . COPD Maternal Grandfather   . Cancer Maternal Grandfather        prostate  . Asthma Maternal Grandfather   . Asthma Son   . Bronchitis Son     Social History   Tobacco Use  . Smoking status: Never Smoker  . Smokeless tobacco: Never Used  Vaping Use  . Vaping Use: Never used  Substance Use Topics  . Alcohol use: Not Currently    Comment: occ  . Drug use: No    Home Medications Prior to Admission medications   Medication Sig Start Date End Date Taking? Authorizing Provider  acetaminophen (TYLENOL) 325 MG tablet Take 650 mg by mouth every 6 (six) hours as needed for moderate pain.    [provider]  esomeprazole (NEXIUM) 40 MG capsule Take by mouth. Patient not taking: Reported on 09/28/2020 09/18/20 10/18/20  [provider]  etonogestrel (NEXPLANON) 68 MG IMPL implant Inject 1 each into the skin continuous.    [provider]  ivabradine (CORLANOR) 5 MG TABS tablet Take 0.5 tablets (2.5 mg total) by mouth 2 (two) times daily with a meal. 07/01/20   Laurey Morale, MD  megestrol (MEGACE) 20 MG tablet Take 1 tablet (20 mg total) by mouth daily. 09/15/20   Hoy Register, MD  naproxen (NAPROSYN) 500 MG tablet Take 500 mg by mouth 2 (two) times daily as needed.    [provider]  potassium chloride SA (KLOR-CON) 20 MEQ tablet Take 1 tablet (20 mEq total) by mouth daily. Patient not taking: Reported on 09/28/2020 08/22/20   Laurey Morale, MD  sertraline (ZOLOFT) 25 MG tablet Take 1 tablet (25 mg total) by mouth daily. 08/22/20 08/22/21  Laurey Morale, MD  sucralfate (CARAFATE) 1 GM/10ML suspension Take 10 mLs (1 g total) by mouth 4  (four) times daily -  with meals and at bedtime. Patient not taking: No sig reported 09/21/20   Harlene Salts A, PA-C    Allergies    Patient has no known allergies.  Review of Systems   Review of Systems  All other systems reviewed and are negative.   Physical Exam Updated Vital Signs BP (!) 133/97   Pulse 98   Temp 98.1 F (36.7 C)   Resp 16   Ht 5\' 9"  (1.753 m)   Wt 57.6 kg   SpO2 100%   BMI 18.75 kg/m   Physical Exam Vitals and nursing note reviewed.  Constitutional:      General: She is not in acute distress.    Appearance: She is well-developed. She is not diaphoretic.  HENT:     Head: Normocephalic and atraumatic.  Cardiovascular:     Rate and Rhythm: Normal rate and regular rhythm.     Heart sounds: No murmur heard. No friction rub. No gallop.   Pulmonary:     Effort: Pulmonary effort is normal. No respiratory distress.     Breath sounds: Normal breath sounds. No wheezing.  Abdominal:     General: Bowel sounds are normal. There is no distension.     Palpations: Abdomen is soft.     Tenderness: There is no abdominal tenderness.  Musculoskeletal:        General: Normal range of motion.     Cervical back: Normal range of motion and neck supple.     Right lower leg: No tenderness. No edema.     Left lower leg: No tenderness. No edema.  Skin:    General: Skin is warm and dry.  Neurological:     General: No focal deficit present.     Mental Status: She is alert and oriented to person, place, and time.     Cranial Nerves: No cranial nerve deficit.     Motor: No weakness.     ED Results / Procedures / Treatments   Labs (all labs ordered are listed, but only abnormal results are displayed) Labs Reviewed  TROPONIN I (HIGH SENSITIVITY)    EKG None  Radiology DG Chest 2 View  Result Date: 09/29/2020 CLINICAL DATA:  Chest pain EXAM: CHEST - 2 VIEW COMPARISON:  One-view chest x-ray 09/28/2020 FINDINGS: The heart size and mediastinal contours are  within normal limits. Both lungs are clear. The visualized skeletal structures are unremarkable. IMPRESSION: Negative two view chest x-ray Electronically Signed   By: 11/28/2020 M.D.   On: 09/29/2020 16:11   DG  Chest Portable 1 View  Result Date: 09/28/2020 CLINICAL DATA:  35 year old female with chest pain EXAM: PORTABLE CHEST 1 VIEW COMPARISON:  Chest radiograph dated 09/27/2020 and CT dated 09/27/2020 FINDINGS: The heart size and mediastinal contours are within normal limits. Both lungs are clear. The visualized skeletal structures are unremarkable. IMPRESSION: No active disease. Electronically Signed   By: Elgie Collard M.D.   On: 09/28/2020 18:18    Procedures Procedures   Medications Ordered in ED Medications  ketorolac (TORADOL) 30 MG/ML injection 30 mg (has no administration in time range)    ED Course  I have reviewed the triage vital signs and the nursing notes.  Pertinent labs & imaging results that were available during my care of the patient were reviewed by me and considered in my medical decision making (see chart for details).    MDM Rules/Calculators/A&P  Patient is well-known to the emergency department for recent frequent visits involving chest pain.  Tonight's complaint involves chest pain and also headache.  Patient is neurologically intact and head CT is negative.  She is feeling better after receiving Toradol.  Additional troponin performed and is negative.  Patient had a CTA of the chest at an outside facility 3 days ago and I do not feel any further additional testing is indicated.  Patient to keep a record of her blood pressures at home and follow-up with her cardiologist/primary doctor.  Final Clinical Impression(s) / ED Diagnoses Final diagnoses:  None    Rx / DC Orders ED Discharge Orders    None       Geoffery Lyons, MD 09/30/20 [phone]

## 2020-10-01 ENCOUNTER — Ambulatory Visit: Payer: Medicaid Other | Admitting: Nurse Practitioner

## 2020-10-01 ENCOUNTER — Encounter (HOSPITAL_COMMUNITY): Payer: Self-pay | Admitting: Emergency Medicine

## 2020-10-01 ENCOUNTER — Other Ambulatory Visit: Payer: Self-pay

## 2020-10-01 ENCOUNTER — Emergency Department (HOSPITAL_COMMUNITY)
Admission: EM | Admit: 2020-10-01 | Discharge: 2020-10-01 | Disposition: A | Discharge status: Home / Self Care | Payer: Medicaid Other | Attending: Emergency Medicine | Admitting: Emergency Medicine

## 2020-10-01 ENCOUNTER — Telehealth: Payer: Self-pay | Admitting: Nurse Practitioner

## 2020-10-01 DIAGNOSIS — R002 Palpitations: Secondary | ICD-10-CM | POA: Diagnosis not present

## 2020-10-01 DIAGNOSIS — I951 Orthostatic hypotension: Secondary | ICD-10-CM | POA: Insufficient documentation

## 2020-10-01 DIAGNOSIS — R000 Tachycardia, unspecified: Secondary | ICD-10-CM | POA: Diagnosis not present

## 2020-10-01 DIAGNOSIS — Z79899 Other long term (current) drug therapy: Secondary | ICD-10-CM | POA: Insufficient documentation

## 2020-10-01 DIAGNOSIS — I10 Essential (primary) hypertension: Secondary | ICD-10-CM | POA: Diagnosis present

## 2020-10-01 DIAGNOSIS — I110 Hypertensive heart disease with heart failure: Secondary | ICD-10-CM | POA: Insufficient documentation

## 2020-10-01 DIAGNOSIS — I5022 Chronic systolic (congestive) heart failure: Secondary | ICD-10-CM | POA: Insufficient documentation

## 2020-10-01 LAB — CBC WITH DIFFERENTIAL/PLATELET
Abs Immature Granulocytes: 0.01 10*3/uL (ref 0.00–0.07)
Basophils Absolute: 0 10*3/uL (ref 0.0–0.1)
Basophils Relative: 0 %
Eosinophils Absolute: 0.1 10*3/uL (ref 0.0–0.5)
Eosinophils Relative: 1 %
HCT: 35.2 % — ABNORMAL LOW (ref 36.0–46.0)
Hemoglobin: 11.5 g/dL — ABNORMAL LOW (ref 12.0–15.0)
Immature Granulocytes: 0 %
Lymphocytes Relative: 27 %
Lymphs Abs: 1.4 10*3/uL (ref 0.7–4.0)
MCH: 28.6 pg (ref 26.0–34.0)
MCHC: 32.7 g/dL (ref 30.0–36.0)
MCV: 87.6 fL (ref 80.0–100.0)
Monocytes Absolute: 0.4 10*3/uL (ref 0.1–1.0)
Monocytes Relative: 9 %
Neutro Abs: 3.2 10*3/uL (ref 1.7–7.7)
Neutrophils Relative %: 63 %
Platelets: 218 10*3/uL (ref 150–400)
RBC: 4.02 MIL/uL (ref 3.87–5.11)
RDW: 12.5 % (ref 11.5–15.5)
WBC: 5.1 10*3/uL (ref 4.0–10.5)
nRBC: 0 % (ref 0.0–0.2)

## 2020-10-01 LAB — I-STAT BETA HCG BLOOD, ED (MC, WL, AP ONLY): I-stat hCG, quantitative: <5 m[IU]/mL (ref <5)

## 2020-10-01 LAB — BASIC METABOLIC PANEL
Anion gap: 11 (ref 5–15)
BUN: 13 mg/dL (ref 6–20)
CO2: 20 mmol/L — ABNORMAL LOW (ref 22–32)
Calcium: 9.5 mg/dL (ref 8.9–10.3)
Chloride: 105 mmol/L (ref 98–111)
Creatinine, Ser: 0.95 mg/dL (ref 0.44–1.00)
GFR, Estimated: >60 mL/min (ref >60)
Glucose, Bld: 110 mg/dL — ABNORMAL HIGH (ref 70–99)
Potassium: 3.6 mmol/L (ref 3.5–5.1)
Sodium: 136 mmol/L (ref 135–145)

## 2020-10-01 LAB — HCG, QUANTITATIVE, PREGNANCY: hCG, Beta Chain, Quant, S: <1 m[IU]/mL (ref <5)

## 2020-10-01 LAB — TROPONIN I (HIGH SENSITIVITY): Troponin I (High Sensitivity): 3 ng/L (ref <18)

## 2020-10-01 MED ORDER — LORAZEPAM 0.5 MG PO TABS
0.5000 mg | ORAL_TABLET | Freq: Once | ORAL | Status: AC
  Administered 2020-10-01: 0.5 mg via ORAL
  Filled 2020-10-01: qty 1

## 2020-10-01 MED ORDER — SODIUM CHLORIDE 0.9 % IV BOLUS
500.0000 mL | Freq: Once | INTRAVENOUS | Status: AC
  Administered 2020-10-01: 500 mL via INTRAVENOUS

## 2020-10-01 NOTE — Discharge Instructions (Signed)
Keep yourself hydrated.  Take your medications as prescribed.  Follow-up with Dr. 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 Return to the ED with new or worsening symptoms.

## 2020-10-01 NOTE — ED Provider Notes (Signed)
[facility] EMERGENCY DEPARTMENT Provider Note   CSN: [id number] Arrival date & time: 10/01/20  0211     History Chief Complaint  Patient presents with  . Hypertension         Tina Fletcher is a 34 y.o. female.  Patient with history of heart failure with reduced ejection fraction, hypertension, migraines here with palpitations and racing heart sensation.  Patient has been to the ED multiple times over the past several days.  She is also been seen in outside facility [facility].  States today she was woken up from sleep with a sensation of palpitations and racing heart and thought her pulse was as high as 140.  When her pulse got that high she felt burning in her chest.  She admits to feeling anxious.  She states she is having palpitations on and off throughout her various ED visits over the past week.  She had a headache at home that has resolved with Tylenol.  She had burning in her chest at home and has resolved with reduction in her heart rate.  States compliance with her medications.  She has been taking hydrochlorothiazide as prescribed for blood pressure as well as and amlodipine she took as needed for palpitations.  She denies any syncope.  She had one episode of vomiting yesterday one episode of diarrhea as well.  No fever.  No possibility of pregnancy  The history is provided by the patient.  Hypertension Pertinent negatives include no chest pain, no abdominal pain, no headaches and no shortness of breath.       Past Medical History:  Diagnosis Date  . Abnormal menstrual periods 02/12/2014  . CHF (congestive heart failure) (HCC)   . Herpes simplex without mention of complication   . Hypertension   . Medical history non-contributory   . Migraines   . Nexplanon insertion 08/13/2013   08/13/13 inserted left arm, remove 08/13/16  . Vitamin D deficiency 04/01/2016    Patient Active Problem List   Diagnosis Date Noted  . Hypertensive crisis 08/18/2020  . Hypokalemia  08/18/2020  . Prolonged QT interval 08/18/2020  . Syncope   . Chronic systolic CHF (congestive heart failure) (HCC)   . Cervical cancer screening 10/23/2019  . Systolic CHF, acute (HCC) 05/10/2019  . Vitamin D deficiency 04/01/2016  . Nexplanon insertion 08/13/2013  . Thrombocytopenia (HCC) 04/17/2013  . 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 Underweight 01/08/2013    Past Surgical History:  Procedure Laterality Date  . RIGHT/LEFT HEART CATH AND CORONARY ANGIOGRAPHY N/A 05/14/2019   Procedure: RIGHT/LEFT HEART CATH AND CORONARY ANGIOGRAPHY;  Surgeon: Laurey Morale, MD;  Location: [facility] INVASIVE CV LAB;  Service: Cardiovascular;  Laterality: N/A;     OB History    Gravida  2   Para  2   Term  2   Preterm      AB      Living  2     SAB      IAB      Ectopic      Multiple      Live Births  2           Family History  Problem Relation Age of Onset  . Diabetes Maternal Grandmother   . Cancer Paternal Grandfather        prancreatic  . COPD Maternal Grandfather   . Cancer Maternal Grandfather        prostate  . Asthma Maternal Grandfather   . Asthma Son   .  Bronchitis Son     Social History   Tobacco Use  . Smoking status: Never Smoker  . Smokeless tobacco: Never Used  Vaping Use  . Vaping Use: Never used  Substance Use Topics  . Alcohol use: Not Currently    Comment: occ  . Drug use: No    Home Medications Prior to Admission medications   Medication Sig Start Date End Date Taking? Authorizing Provider  acetaminophen (TYLENOL) 325 MG tablet Take 650 mg by mouth every 6 (six) hours as needed for moderate pain.    [provider]  esomeprazole (NEXIUM) 40 MG capsule Take by mouth. Patient not taking: Reported on 09/28/2020 09/18/20 10/18/20  [provider]  etonogestrel (NEXPLANON) 68 MG IMPL implant Inject 1 each into the skin continuous.    [provider]  ivabradine (CORLANOR) 5 MG TABS tablet Take 0.5 tablets (2.5 mg  total) by mouth 2 (two) times daily with a meal. 07/01/20   Laurey Morale, MD  megestrol (MEGACE) 20 MG tablet Take 1 tablet (20 mg total) by mouth daily. 09/15/20   Hoy Register, MD  naproxen (NAPROSYN) 500 MG tablet Take 500 mg by mouth 2 (two) times daily as needed.    [provider]  potassium chloride SA (KLOR-CON) 20 MEQ tablet Take 1 tablet (20 mEq total) by mouth daily. Patient not taking: Reported on 09/28/2020 08/22/20   Laurey Morale, MD  sertraline (ZOLOFT) 25 MG tablet Take 1 tablet (25 mg total) by mouth daily. 08/22/20 08/22/21  Laurey Morale, MD  sucralfate (CARAFATE) 1 GM/10ML suspension Take 10 mLs (1 g total) by mouth 4 (four) times daily -  with meals and at bedtime. Patient not taking: No sig reported 09/21/20   Harlene Salts A, PA-C    Allergies    Patient has no known allergies.  Review of Systems   Review of Systems  Constitutional: Positive for fatigue. Negative for activity change, appetite change and fever.  HENT: Negative for congestion and rhinorrhea.   Respiratory: Positive for chest tightness. Negative for shortness of breath.   Cardiovascular: Positive for palpitations. Negative for chest pain.  Gastrointestinal: Negative for abdominal pain, nausea and vomiting.  Genitourinary: Negative for hematuria.  Musculoskeletal: Negative for arthralgias and myalgias.  Skin: Negative for rash.  Neurological: Positive for dizziness and light-headedness. Negative for weakness and headaches.   all other systems are negative except as noted in the HPI and PMH.   Physical Exam Updated Vital Signs BP (!) 129/93   Pulse 92   Temp 98.6 F (37 C) (Oral)   Resp 18   Ht 5\' 9"  (1.753 m)   Wt 57.6 kg   SpO2 100%   BMI 18.75 kg/m   Physical Exam Vitals and nursing note reviewed.  Constitutional:      General: She is not in acute distress.    Appearance: She is well-developed.     Comments: Anxious appearing  HENT:     Head: Normocephalic and  atraumatic.     Mouth/Throat:     Pharynx: No oropharyngeal exudate.  Eyes:     Conjunctiva/sclera: Conjunctivae normal.     Pupils: Pupils are equal, round, and reactive to light.  Neck:     Comments: No meningismus. Cardiovascular:     Rate and Rhythm: Regular rhythm. Tachycardia present.     Heart sounds: Normal heart sounds. No murmur heard.   Pulmonary:     Effort: Pulmonary effort is normal. No respiratory distress.  Breath sounds: Normal breath sounds.  Abdominal:     Palpations: Abdomen is soft.     Tenderness: There is no abdominal tenderness. There is no guarding or rebound.  Musculoskeletal:        General: No tenderness. Normal range of motion.     Cervical back: Normal range of motion and neck supple.  Skin:    General: Skin is warm.  Neurological:     Mental Status: She is alert and oriented to person, place, and time.     Cranial Nerves: No cranial nerve deficit.     Motor: No abnormal muscle tone.     Coordination: Coordination normal.     Comments:  5/5 strength throughout. CN 2-12 intact.Equal grip strength.   Psychiatric:        Behavior: Behavior normal.     ED Results / Procedures / Treatments   Labs (all labs ordered are listed, but only abnormal results are displayed) Labs Reviewed  CBC WITH DIFFERENTIAL/PLATELET - Abnormal; Notable for the following components:      Result Value   Hemoglobin 11.5 (*)    HCT 35.2 (*)    All other components within normal limits  BASIC METABOLIC PANEL - Abnormal; Notable for the following components:   CO2 20 (*)    Glucose, Bld 110 (*)    All other components within normal limits  HCG, QUANTITATIVE, PREGNANCY  I-STAT BETA HCG BLOOD, ED ([facility], [facility], [facility] ONLY)  TROPONIN I (HIGH SENSITIVITY)    EKG None  Radiology DG Chest 2 View  Result Date: 09/29/2020 CLINICAL DATA:  Chest pain EXAM: CHEST - 2 VIEW COMPARISON:  One-view chest x-ray 09/28/2020 FINDINGS: The heart size and mediastinal contours are within  normal limits. Both lungs are clear. The visualized skeletal structures are unremarkable. IMPRESSION: Negative two view chest x-ray Electronically Signed   By: Marin Roberts M.D.   On: 09/29/2020 16:11   CT Head Wo Contrast  Result Date: 09/30/2020 CLINICAL DATA:  35 year old female with headache, chest pain, hypertensive, vomiting. EXAM: CT HEAD WITHOUT CONTRAST TECHNIQUE: Contiguous axial images were obtained from the base of the skull through the vertex without intravenous contrast. COMPARISON:  Head CT 02/04/2017. FINDINGS: Brain: No midline shift, ventriculomegaly, mass effect, evidence of mass lesion, intracranial hemorrhage or evidence of cortically based acute infarction. Gray-white matter differentiation is within normal limits throughout the brain. Mega cisterna magna (normal variant). No encephalomalacia identified. Vascular: No suspicious intracranial vascular hyperdensity. Skull: Negative. Sinuses/Orbits: Visualized paranasal sinuses and mastoids are clear. Other: Visualized orbits and scalp soft tissues are within normal limits. IMPRESSION: Stable and normal noncontrast Head CT. Electronically Signed   By: Odessa Fleming M.D.   On: 09/30/2020 06:06    Procedures Procedures   Medications Ordered in ED Medications  LORazepam (ATIVAN) tablet 0.5 mg (has no administration in time range)    ED Course  I have reviewed the triage vital signs and the nursing notes.  Pertinent labs & imaging results that were available during my care of the patient were reviewed by me and considered in my medical decision making (see chart for details).    MDM Rules/Calculators/A&P                         Patient with CHF history of hypertension here with palpitations and racing heart sensation.  No chest pain currently.  Recent ED visits reviewed.  Patient had CT angiogram of her chest done at outside facility on  April 2 that was negative for pulmonary embolism. CT head negative at this facility  yesterday.  Labs appear to be at baseline.  Low suspicion for ACS, pulmonary embolism.  Patient is orthostatic by heart rate.  Heart rate goes from 92 lying to 142 standing.  Blood pressure remained stable. Per cardiology documentation, there is some concern patient could have underlying pots syndrome.  This may be contributing to her palpitations and tachycardia. Should be hydrated gently given her history of reduced ejection fraction  Suspect patient has underlying POTS syndrome. Needs to followup with Dr. Graciela Husbands per Dr. Alford Highland plan. Recommend compression hose, gradual position changes, d/w cardiology.   She is tolerating PO and ambulatory. Low suspicion for ACS, PE, malignant arrhythmia.  Return precautions discussed.   ED ECG REPORT   Date: 10/01/2020  Rate: 96  Rhythm: normal sinus rhythm  QRS Axis: normal  Intervals: normal  ST/T Wave abnormalities: normal  Conduction Disutrbances:none  Narrative Interpretation:   Old EKG Reviewed: unchanged  I have personally reviewed the EKG tracing and agree with the computerized printout as noted.  Final Clinical Impression(s) / ED Diagnoses Final diagnoses:  Palpitations  Orthostasis    Rx / DC Orders ED Discharge Orders    None       Maanasa Aderhold, Jeannett Senior, MD 10/01/20 [phone]

## 2020-10-01 NOTE — Telephone Encounter (Signed)
Attempted to contact patient. LVM on alternate phone number [phone] to call the office back. It also looks like she has been in the ED today as well and instructed to follow up with Cardiology

## 2020-10-01 NOTE — ED Triage Notes (Signed)
Pt states she is here because her blood pressure is high.

## 2020-10-02 ENCOUNTER — Other Ambulatory Visit: Payer: Self-pay

## 2020-10-02 ENCOUNTER — Emergency Department (HOSPITAL_COMMUNITY): Payer: Medicaid Other

## 2020-10-02 ENCOUNTER — Emergency Department (HOSPITAL_COMMUNITY)
Admission: EM | Admit: 2020-10-02 | Discharge: 2020-10-02 | Disposition: A | Discharge status: Home / Self Care | Payer: Medicaid Other | Attending: Emergency Medicine | Admitting: Emergency Medicine

## 2020-10-02 ENCOUNTER — Telehealth: Payer: Medicaid Other | Admitting: Internal Medicine

## 2020-10-02 ENCOUNTER — Encounter (HOSPITAL_COMMUNITY): Payer: Self-pay | Admitting: *Deleted

## 2020-10-02 DIAGNOSIS — R072 Precordial pain: Secondary | ICD-10-CM | POA: Diagnosis present

## 2020-10-02 DIAGNOSIS — I5022 Chronic systolic (congestive) heart failure: Secondary | ICD-10-CM | POA: Insufficient documentation

## 2020-10-02 DIAGNOSIS — I110 Hypertensive heart disease with heart failure: Secondary | ICD-10-CM | POA: Insufficient documentation

## 2020-10-02 DIAGNOSIS — Z79899 Other long term (current) drug therapy: Secondary | ICD-10-CM | POA: Diagnosis not present

## 2020-10-02 DIAGNOSIS — R0789 Other chest pain: Secondary | ICD-10-CM

## 2020-10-02 LAB — CBC WITH DIFFERENTIAL/PLATELET
Abs Immature Granulocytes: 0.01 10*3/uL (ref 0.00–0.07)
Basophils Absolute: 0 10*3/uL (ref 0.0–0.1)
Basophils Relative: 0 %
Eosinophils Absolute: 0 10*3/uL (ref 0.0–0.5)
Eosinophils Relative: 1 %
HCT: 32.5 % — ABNORMAL LOW (ref 36.0–46.0)
Hemoglobin: 10.9 g/dL — ABNORMAL LOW (ref 12.0–15.0)
Immature Granulocytes: 0 %
Lymphocytes Relative: 25 %
Lymphs Abs: 1.1 10*3/uL (ref 0.7–4.0)
MCH: 29.3 pg (ref 26.0–34.0)
MCHC: 33.5 g/dL (ref 30.0–36.0)
MCV: 87.4 fL (ref 80.0–100.0)
Monocytes Absolute: 0.4 10*3/uL (ref 0.1–1.0)
Monocytes Relative: 8 %
Neutro Abs: 3 10*3/uL (ref 1.7–7.7)
Neutrophils Relative %: 66 %
Platelets: 227 10*3/uL (ref 150–400)
RBC: 3.72 MIL/uL — ABNORMAL LOW (ref 3.87–5.11)
RDW: 12.4 % (ref 11.5–15.5)
WBC: 4.6 10*3/uL (ref 4.0–10.5)
nRBC: 0 % (ref 0.0–0.2)

## 2020-10-02 LAB — COMPREHENSIVE METABOLIC PANEL
ALT: 20 U/L (ref 0–44)
AST: 18 U/L (ref 15–41)
Albumin: 4.8 g/dL (ref 3.5–5.0)
Alkaline Phosphatase: 36 U/L — ABNORMAL LOW (ref 38–126)
Anion gap: 14 (ref 5–15)
BUN: 11 mg/dL (ref 6–20)
CO2: 18 mmol/L — ABNORMAL LOW (ref 22–32)
Calcium: 9.6 mg/dL (ref 8.9–10.3)
Chloride: 103 mmol/L (ref 98–111)
Creatinine, Ser: 0.88 mg/dL (ref 0.44–1.00)
GFR, Estimated: >60 mL/min (ref >60)
Glucose, Bld: 80 mg/dL (ref 70–99)
Potassium: 3.7 mmol/L (ref 3.5–5.1)
Sodium: 135 mmol/L (ref 135–145)
Total Bilirubin: 1.2 mg/dL (ref 0.3–1.2)
Total Protein: 7.4 g/dL (ref 6.5–8.1)

## 2020-10-02 LAB — TROPONIN I (HIGH SENSITIVITY): Troponin I (High Sensitivity): 2 ng/L (ref <18)

## 2020-10-02 MED ORDER — OXYCODONE-ACETAMINOPHEN 5-325 MG PO TABS
1.0000 | ORAL_TABLET | Freq: Once | ORAL | Status: AC
Start: 2020-10-02 — End: 2020-10-02
  Administered 2020-10-02: 1 via ORAL
  Filled 2020-10-02: qty 1

## 2020-10-02 MED ORDER — SODIUM CHLORIDE 0.9 % IV BOLUS
500.0000 mL | Freq: Once | INTRAVENOUS | Status: AC
  Administered 2020-10-02: 500 mL via INTRAVENOUS

## 2020-10-02 MED ORDER — HYDROXYZINE HCL 25 MG PO TABS
25.0000 mg | ORAL_TABLET | Freq: Once | ORAL | Status: AC
  Administered 2020-10-02: 25 mg via ORAL
  Filled 2020-10-02: qty 1

## 2020-10-02 NOTE — ED Triage Notes (Addendum)
Pt c/o mid chest pain, hypertension, elevated heart rate and feeling as if she was going to pass out when she stood up that started at 0600 this morning. Pt reports this has been going on for weeks now and she was diagnosed with POTS 2 days ago. Pt reports she came to ED today because her chest pain was worse. No radiation of pain.

## 2020-10-02 NOTE — ED Notes (Signed)
Pt ambulated to restroom. 

## 2020-10-02 NOTE — ED Provider Notes (Signed)
[facility] EMERGENCY DEPARTMENT Provider Note   CSN: [id number] Arrival date & time: 10/02/20  0745     History Chief Complaint  Patient presents with  . Chest Pain    Tina Fletcher is a 35 y.o. female.  Patient complains of chest pain.  Patient was diagnosed with pots syndrome recently.  She was evaluated yesterday in another emergency department and was given Vistaril and told to follow-up with her cardiologist  The history is provided by the patient and medical records. No language interpreter was used.  Chest Pain Pain location:  Substernal area Pain quality: aching   Pain radiates to:  Does not radiate Pain severity:  Mild Onset quality:  Gradual Associated symptoms: no abdominal pain, no back pain, no cough, no fatigue and no headache        Past Medical History:  Diagnosis Date  . Abnormal menstrual periods 02/12/2014  . CHF (congestive heart failure) (HCC)   . Herpes simplex without mention of complication   . Hypertension   . Medical history non-contributory   . Migraines   . Nexplanon insertion 08/13/2013   08/13/13 inserted left arm, remove 08/13/16  . Vitamin D deficiency 04/01/2016    Patient Active Problem List   Diagnosis Date Noted  . Hypertensive crisis 08/18/2020  . Hypokalemia 08/18/2020  . Prolonged QT interval 08/18/2020  . Syncope   . Chronic systolic CHF (congestive heart failure) (HCC)   . Cervical cancer screening 10/23/2019  . Systolic CHF, acute (HCC) 05/10/2019  . Vitamin D deficiency 04/01/2016  . Nexplanon insertion 08/13/2013  . Thrombocytopenia (HCC) 04/17/2013  . 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 Underweight 01/08/2013    Past Surgical History:  Procedure Laterality Date  . RIGHT/LEFT HEART CATH AND CORONARY ANGIOGRAPHY N/A 05/14/2019   Procedure: RIGHT/LEFT HEART CATH AND CORONARY ANGIOGRAPHY;  Surgeon: Laurey Morale, MD;  Location: [facility] INVASIVE CV LAB;  Service: Cardiovascular;  Laterality: N/A;     OB History     Gravida  2   Para  2   Term  2   Preterm      AB      Living  2     SAB      IAB      Ectopic      Multiple      Live Births  2           Family History  Problem Relation Age of Onset  . Diabetes Maternal Grandmother   . Cancer Paternal Grandfather        prancreatic  . COPD Maternal Grandfather   . Cancer Maternal Grandfather        prostate  . Asthma Maternal Grandfather   . Asthma Son   . Bronchitis Son     Social History   Tobacco Use  . Smoking status: Never Smoker  . Smokeless tobacco: Never Used  Vaping Use  . Vaping Use: Never used  Substance Use Topics  . Alcohol use: Not Currently  . Drug use: No    Home Medications Prior to Admission medications   Medication Sig Start Date End Date Taking? Authorizing Provider  etonogestrel (NEXPLANON) 68 MG IMPL implant Inject 1 each into the skin continuous.   Yes [provider]  hydrochlorothiazide (HYDRODIURIL) 25 MG tablet Take 1 tablet by mouth daily. 09/30/20 10/20/20 Yes [provider]  ivabradine (CORLANOR) 5 MG TABS tablet Take 0.5 tablets (2.5 mg total) by mouth 2 (two) times daily with a meal.  07/01/20  Yes Laurey Morale, MD  megestrol (MEGACE) 20 MG tablet Take 1 tablet (20 mg total) by mouth daily. 09/15/20  Yes Hoy Register, MD  potassium chloride SA (KLOR-CON) 20 MEQ tablet Take 1 tablet (20 mEq total) by mouth daily. 08/22/20  Yes Laurey Morale, MD  sertraline (ZOLOFT) 25 MG tablet Take 1 tablet (25 mg total) by mouth daily. 08/22/20 08/22/21 Yes Laurey Morale, MD  sucralfate (CARAFATE) 1 GM/10ML suspension Take 10 mLs (1 g total) by mouth 4 (four) times daily -  with meals and at bedtime. Patient not taking: No sig reported 09/21/20   Harlene Salts A, PA-C    Allergies    Fentanyl  Review of Systems   Review of Systems  Constitutional: Negative for appetite change and fatigue.  HENT: Negative for congestion, ear discharge and sinus pressure.   Eyes: Negative  for discharge.  Respiratory: Negative for cough.   Cardiovascular: Positive for chest pain.  Gastrointestinal: Negative for abdominal pain and diarrhea.  Genitourinary: Negative for frequency and hematuria.  Musculoskeletal: Negative for back pain.  Skin: Negative for rash.  Neurological: Negative for seizures and headaches.  Psychiatric/Behavioral: Negative for hallucinations.    Physical Exam Updated Vital Signs BP 130/88   Pulse 91   Temp 98.4 F (36.9 C) (Oral)   Resp 17   Ht 5\' 9"  (1.753 m)   Wt 54.4 kg   SpO2 99%   BMI 17.72 kg/m   Physical Exam Vitals and nursing note reviewed.  Constitutional:      Appearance: She is well-developed.  HENT:     Head: Normocephalic.     Mouth/Throat:     Mouth: Mucous membranes are moist.  Eyes:     General: No scleral icterus.    Conjunctiva/sclera: Conjunctivae normal.  Neck:     Thyroid: No thyromegaly.  Cardiovascular:     Rate and Rhythm: Normal rate and regular rhythm.     Heart sounds: No murmur heard. No friction rub. No gallop.   Pulmonary:     Breath sounds: No stridor. No wheezing or rales.  Chest:     Chest wall: No tenderness.  Abdominal:     General: There is no distension.     Tenderness: There is no abdominal tenderness. There is no rebound.  Musculoskeletal:        General: Normal range of motion.     Cervical back: Neck supple.  Lymphadenopathy:     Cervical: No cervical adenopathy.  Skin:    Findings: No erythema or rash.  Neurological:     Mental Status: She is alert and oriented to person, place, and time.     Motor: No abnormal muscle tone.     Coordination: Coordination normal.  Psychiatric:        Behavior: Behavior normal.     ED Results / Procedures / Treatments   Labs (all labs ordered are listed, but only abnormal results are displayed) Labs Reviewed  CBC WITH DIFFERENTIAL/PLATELET - Abnormal; Notable for the following components:      Result Value   RBC 3.72 (*)    Hemoglobin  10.9 (*)    HCT 32.5 (*)    All other components within normal limits  COMPREHENSIVE METABOLIC PANEL - Abnormal; Notable for the following components:   CO2 18 (*)    Alkaline Phosphatase 36 (*)    All other components within normal limits  TROPONIN I (HIGH SENSITIVITY)  TROPONIN I (HIGH SENSITIVITY)  EKG EKG Interpretation  Date/Time:  Thursday October 02 2020 08:01:12 EDT Ventricular Rate:  110 PR Interval:  171 QRS Duration: 77 QT Interval:  346 QTC Calculation: 468 R Axis:   67 Text Interpretation: Sinus tachycardia Borderline repolarization abnormality Confirmed by Bethann Berkshire [phone]) on 10/02/2020 1:42:47 PM   Radiology DG Chest Port 1 View  Result Date: 10/02/2020 CLINICAL DATA:  35 year old female with mid chest pain, hypertension and tachycardia EXAM: PORTABLE CHEST 1 VIEW COMPARISON:  None. FINDINGS: The lungs are clear and negative for focal airspace consolidation, pulmonary edema or suspicious pulmonary nodule. No pleural effusion or pneumothorax. Cardiac and mediastinal contours are within normal limits. No acute fracture or lytic or blastic osseous lesions. The visualized upper abdominal bowel gas pattern is unremarkable. IMPRESSION: Normal chest x-ray. Electronically Signed   By: Malachy Moan M.D.   On: 10/02/2020 09:02    Procedures Procedures   Medications Ordered in ED Medications  hydrOXYzine (ATARAX/VISTARIL) tablet 25 mg (has no administration in time range)  oxyCODONE-acetaminophen (PERCOCET/ROXICET) 5-325 MG per tablet 1 tablet (1 tablet Oral Given 10/02/20 0954)  sodium chloride 0.9 % bolus 500 mL (0 mLs Intravenous Stopped 10/02/20 1346)    ED Course  I have reviewed the triage vital signs and the nursing notes.  Pertinent labs & imaging results that were available during my care of the patient were reviewed by me and considered in my medical decision making (see chart for details).    MDM Rules/Calculators/A&P                           Patient with pots syndrome and atypical chest pain.  She will take her Vistaril and follow-up with her cardiologist Final Clinical Impression(s) / ED Diagnoses Final diagnoses:  Atypical chest pain    Rx / DC Orders ED Discharge Orders    None       Bethann Berkshire, MD 10/03/20 1343

## 2020-10-02 NOTE — Discharge Instructions (Signed)
Take your Vistaril as prescribed and follow-up with your cardiologist next week as scheduled

## 2020-10-02 NOTE — Addendum Note (Signed)
Encounter addended by: Crissie Figures, RN on: 10/02/2020 11:20 AM  Actions taken: Imaging Exam ended

## 2020-10-04 ENCOUNTER — Emergency Department (HOSPITAL_COMMUNITY)
Admission: EM | Admit: 2020-10-04 | Discharge: 2020-10-04 | Disposition: A | Discharge status: Home / Self Care | Payer: Medicaid Other | Attending: Emergency Medicine | Admitting: Emergency Medicine

## 2020-10-04 ENCOUNTER — Other Ambulatory Visit: Payer: Self-pay

## 2020-10-04 ENCOUNTER — Encounter (HOSPITAL_COMMUNITY): Payer: Self-pay

## 2020-10-04 DIAGNOSIS — I110 Hypertensive heart disease with heart failure: Secondary | ICD-10-CM | POA: Insufficient documentation

## 2020-10-04 DIAGNOSIS — R0789 Other chest pain: Secondary | ICD-10-CM | POA: Diagnosis not present

## 2020-10-04 DIAGNOSIS — R002 Palpitations: Secondary | ICD-10-CM | POA: Diagnosis not present

## 2020-10-04 DIAGNOSIS — R519 Headache, unspecified: Secondary | ICD-10-CM | POA: Insufficient documentation

## 2020-10-04 DIAGNOSIS — Z79899 Other long term (current) drug therapy: Secondary | ICD-10-CM | POA: Insufficient documentation

## 2020-10-04 DIAGNOSIS — I5022 Chronic systolic (congestive) heart failure: Secondary | ICD-10-CM | POA: Insufficient documentation

## 2020-10-04 DIAGNOSIS — R000 Tachycardia, unspecified: Secondary | ICD-10-CM | POA: Diagnosis present

## 2020-10-04 NOTE — ED Triage Notes (Addendum)
Pt states she was dx with "heart clots" here last week. States since last week she's had intermittent headaches, elevated blood pressure, periods of "heart racing." Has not seen cardiologist f/u yet. Compliant with HTN medication. Denies vision changes. Denies syncopal episode or head trauma. Speech clear, no neuro deficits noted. Pt mainly concerned about ongoing episodes of tachycardia.

## 2020-10-04 NOTE — Discharge Instructions (Addendum)
Please follow up with your cardiologist on Tuesday.  Your workups in the ER have been reassuring all week.

## 2020-10-04 NOTE — ED Provider Notes (Signed)
[facility] EMERGENCY DEPARTMENT Provider Note   CSN: [id number] Arrival date & time: 10/04/20  1430     History Chief Complaint  Patient presents with  . Headache  . Tachycardia  . Hypertension    Tina Fletcher is a 35 y.o. female with history of tachycardia, congestive heart failure (EF of 40 to 45% in Jan 2022 echo), follows with cardiology, presented ED with racing heart and chest pressure.  The patient was seen in the ED 25 times in the past 6 months.  This includes nearly daily visits for the past week, including 2 days ago in our ER, and yesterday at [facility].  She has had repetitive sets of troponins drawn in the near daily basis, all of which have been unremarkable.  She had repeat EKGs which show persistent borderline sinus tachycardia, with heart rate between 100-110bpm.  She tells me absolutely nothing is changed is her last visit.  She is concerned because her blood pressures are labile at home.  She is wondering whether she may have POTS.  She has an upcoming cardiology appointment in 3 days on Tuesday.  LHC on 05/14/2019 showing no cardiovascular disease, nonischemic cardiomyopathy.    HPI     Past Medical History:  Diagnosis Date  . Abnormal menstrual periods 02/12/2014  . CHF (congestive heart failure) (HCC)   . Herpes simplex without mention of complication   . Hypertension   . Medical history non-contributory   . Migraines   . Nexplanon insertion 08/13/2013   08/13/13 inserted left arm, remove 08/13/16  . Vitamin D deficiency 04/01/2016    Patient Active Problem List   Diagnosis Date Noted  . Hypertensive crisis 08/18/2020  . Hypokalemia 08/18/2020  . Prolonged QT interval 08/18/2020  . Syncope   . Chronic systolic CHF (congestive heart failure) (HCC)   . Cervical cancer screening 10/23/2019  . Systolic CHF, acute (HCC) 05/10/2019  . Vitamin D deficiency 04/01/2016  . Nexplanon insertion 08/13/2013  . Thrombocytopenia (HCC) 04/17/2013  .  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 Underweight 01/08/2013    Past Surgical History:  Procedure Laterality Date  . RIGHT/LEFT HEART CATH AND CORONARY ANGIOGRAPHY N/A 05/14/2019   Procedure: RIGHT/LEFT HEART CATH AND CORONARY ANGIOGRAPHY;  Surgeon: Laurey Morale, MD;  Location: [facility] INVASIVE CV LAB;  Service: Cardiovascular;  Laterality: N/A;     OB History    Gravida  2   Para  2   Term  2   Preterm      AB      Living  2     SAB      IAB      Ectopic      Multiple      Live Births  2           Family History  Problem Relation Age of Onset  . Diabetes Maternal Grandmother   . Cancer Paternal Grandfather        prancreatic  . COPD Maternal Grandfather   . Cancer Maternal Grandfather        prostate  . Asthma Maternal Grandfather   . Asthma Son   . Bronchitis Son     Social History   Tobacco Use  . Smoking status: Never Smoker  . Smokeless tobacco: Never Used  Vaping Use  . Vaping Use: Never used  Substance Use Topics  . Alcohol use: Not Currently  . Drug use: No    Home Medications Prior to Admission medications   Medication  Sig Start Date End Date Taking? Authorizing Provider  etonogestrel (NEXPLANON) 68 MG IMPL implant Inject 1 each into the skin continuous.    [provider]  hydrochlorothiazide (HYDRODIURIL) 25 MG tablet Take 1 tablet by mouth daily. 09/30/20 10/20/20  [provider]  ivabradine (CORLANOR) 5 MG TABS tablet Take 0.5 tablets (2.5 mg total) by mouth 2 (two) times daily with a meal. 07/01/20   Laurey Morale, MD  megestrol (MEGACE) 20 MG tablet Take 1 tablet (20 mg total) by mouth daily. 09/15/20   Hoy Register, MD  potassium chloride SA (KLOR-CON) 20 MEQ tablet Take 1 tablet (20 mEq total) by mouth daily. 08/22/20   Laurey Morale, MD  sertraline (ZOLOFT) 25 MG tablet Take 1 tablet (25 mg total) by mouth daily. 08/22/20 08/22/21  Laurey Morale, MD  sucralfate (CARAFATE) 1 GM/10ML suspension Take 10 mLs (1 g  total) by mouth 4 (four) times daily -  with meals and at bedtime. Patient not taking: No sig reported 09/21/20   Harlene Salts A, PA-C    Allergies    Fentanyl  Review of Systems   Review of Systems  Constitutional: Negative for chills and fever.  HENT: Negative for ear pain and sore throat.   Eyes: Negative for pain and visual disturbance.  Respiratory: Negative for cough and shortness of breath.   Cardiovascular: Positive for chest pain and palpitations.  Gastrointestinal: Negative for abdominal pain and vomiting.  Genitourinary: Negative for dysuria and hematuria.  Musculoskeletal: Negative for arthralgias and back pain.  Skin: Negative for color change and rash.  Neurological: Negative for seizures and syncope.  All other systems reviewed and are negative.   Physical Exam Updated Vital Signs BP (!) 120/99   Pulse (!) 105   Temp 98.8 F (37.1 C) (Oral)   Resp 19   Ht 5\' 9"  (1.753 m)   Wt 54.4 kg   SpO2 100%   BMI 17.72 kg/m   Physical Exam Constitutional:      General: She is not in acute distress. HENT:     Head: Normocephalic and atraumatic.  Eyes:     Conjunctiva/sclera: Conjunctivae normal.     Pupils: Pupils are equal, round, and reactive to light.  Cardiovascular:     Rate and Rhythm: Regular rhythm. Tachycardia present.  Pulmonary:     Effort: Pulmonary effort is normal. No respiratory distress.  Abdominal:     General: There is no distension.     Tenderness: There is no abdominal tenderness.  Skin:    General: Skin is warm and dry.  Neurological:     General: No focal deficit present.     Mental Status: She is alert. Mental status is at baseline.  Psychiatric:        Mood and Affect: Mood normal.        Behavior: Behavior normal.     ED Results / Procedures / Treatments   Labs (all labs ordered are listed, but only abnormal results are displayed) Labs Reviewed - No data to display  EKG EKG Interpretation  Date/Time:  Saturday October 04 2020 14:46:28 EDT Ventricular Rate:  111 PR Interval:  171 QRS Duration: 97 QT Interval:  346 QTC Calculation: 471 R Axis:   70 Text Interpretation: Sinus tachycardia Right atrial enlargement Probable left ventricular hypertrophy No significant change since last tracing October 02 2020 Confirmed by [id number] [phone]) on 10/04/2020 3:23:30 PM   Radiology No results found.  Procedures Procedures   Medications  Ordered in ED Medications - No data to display  ED Course  I have reviewed the triage vital signs and the nursing notes.  Pertinent labs & imaging results that were available during my care of the patient were reviewed by me and considered in my medical decision making (see chart for details).  Patient here with palpitations, tachycardia.  EKG shows persistent sinus tachycardia noted on numerous prior EKGs.  I reviewed her prior medical records.  I do not believe we need yet another set of troponins today.  She has had a tremendous amount of blood drawn over the past several days in recurring ED visits, all unremarkable.  Hgb was thankfully normal yesterday.  I reassured her that I do not believe there are any other life-threatening medical conditions.  Advised that she continue to hydrate at home.  Follow-up with a cardiologist in 3 days.  Okay for discharge.    Final Clinical Impression(s) / ED Diagnoses Final diagnoses:  Tachycardia    Rx / DC Orders ED Discharge Orders    None       Terald Sleeper, MD 10/04/20 2102

## 2020-10-04 NOTE — ED Triage Notes (Signed)
Took 325mg  ASA today for headache.

## 2020-10-05 ENCOUNTER — Telehealth: Payer: Self-pay | Admitting: Cardiology

## 2020-10-05 ENCOUNTER — Encounter (HOSPITAL_COMMUNITY): Payer: Self-pay | Admitting: Emergency Medicine

## 2020-10-05 ENCOUNTER — Emergency Department (HOSPITAL_COMMUNITY)
Admission: EM | Admit: 2020-10-05 | Discharge: 2020-10-05 | Disposition: A | Discharge status: Home / Self Care | Payer: Medicaid Other | Attending: Emergency Medicine | Admitting: Emergency Medicine

## 2020-10-05 DIAGNOSIS — R079 Chest pain, unspecified: Secondary | ICD-10-CM

## 2020-10-05 DIAGNOSIS — I509 Heart failure, unspecified: Secondary | ICD-10-CM | POA: Diagnosis not present

## 2020-10-05 DIAGNOSIS — R0789 Other chest pain: Secondary | ICD-10-CM | POA: Diagnosis not present

## 2020-10-05 DIAGNOSIS — I110 Hypertensive heart disease with heart failure: Secondary | ICD-10-CM | POA: Insufficient documentation

## 2020-10-05 MED ORDER — SUCRALFATE 1 G PO TABS: 1.0000 g | ORAL_TABLET | Freq: Four times a day (QID) | ORAL | 0 refills | Status: DC | PRN

## 2020-10-05 MED ORDER — ALUM & MAG HYDROXIDE-SIMETH 200-200-20 MG/5ML PO SUSP
30.0000 mL | Freq: Once | ORAL | Status: AC
  Administered 2020-10-05: 30 mL via ORAL
  Filled 2020-10-05: qty 30

## 2020-10-05 MED ORDER — OMEPRAZOLE 20 MG PO CPDR: 20.0000 mg | DELAYED_RELEASE_CAPSULE | Freq: Every day | ORAL | 1 refills | Status: DC

## 2020-10-05 NOTE — Telephone Encounter (Signed)
Pt called for labile BP - she feels bad in the night with chest pain and check s BP and elevated, she has been to ER several times.  Was there at 0400 today her BP in ER 143/104 P 116.  EKG ST HR 108, LVH by voltage. troponins neg, labs normal.  Was in ER 4/3; 4/4.4/5 4/6; 4/7; and 4/10  She was placed on protonix.  CXR neg.  X several.  CT head neg.   BP now with BP 108 systolic and P 100.   She will hold HCTZ until tonight then take if BP >110.  I will send note to Dr. Shirlee Latch and office to have pt seen.  Pt agreeable.

## 2020-10-05 NOTE — Discharge Instructions (Addendum)
You were evaluated in the Emergency Department and after careful evaluation, we did not find any emergent condition requiring admission or further testing in the hospital.  Your exam/testing today is overall reassuring.  Symptoms may be related to acid reflux.  Please take the omeprazole medication daily to prevent pain.  You can also use the Carafate medication up to 4 times daily throughout the day for discomfort.  Please keep your follow-up with cardiology.  Please return to the Emergency Department if you experience any worsening of your condition.   Thank you for allowing [place] to be a part of your care.

## 2020-10-05 NOTE — ED Notes (Signed)
ED Provider at bedside. 

## 2020-10-05 NOTE — ED Triage Notes (Signed)
Pt with c/o chest pain that started 1.5 hrs ago when she was resting.

## 2020-10-05 NOTE — ED Provider Notes (Signed)
[facility]EMER[facility]Y [facility] Department Provider Note MRN:  [id number]  Arrival date & time: 10/05/20     Chief Complaint   Chest Pain   History of Present Illness   Tina Fletcher is a 34 y.o. year-old female with a history of CHF presenting to the ED with chief complaint of chest.  1 month of intermittent chest pain, worse when laying flat.  Has been seen in the emergency department multiple times with multiple negative work-ups.  Pain described as a burning discomfort.  Denies shortness of breath, no abdominal pain, no headache or vision change.  Patient is concerned that she has pots syndrome, concerned about her high blood pressure at home as well.  Blood pressures seem to change frequently  Review of Systems  A complete 10 system review of systems was obtained and all systems are negative except as noted in the HPI and PMH.   Patient's Health History    Past Medical History:  Diagnosis Date  . Abnormal menstrual periods 02/12/2014  . CHF (congestive heart failure) (HCC)   . Herpes simplex without mention of complication   . Hypertension   . Medical history non-contributory   . Migraines   . Nexplanon insertion 08/13/2013   08/13/13 inserted left arm, remove 08/13/16  . Vitamin D deficiency 04/01/2016    Past Surgical History:  Procedure Laterality Date  . RIGHT/LEFT HEART CATH AND CORONARY ANGIOGRAPHY N/A 05/14/2019   Procedure: RIGHT/LEFT HEART CATH AND CORONARY ANGIOGRAPHY;  Surgeon: Laurey Morale, MD;  Location: [facility] INVASIVE CV LAB;  Service: Cardiovascular;  Laterality: N/A;    Family History  Problem Relation Age of Onset  . Diabetes Maternal Grandmother   . Cancer Paternal Grandfather        prancreatic  . COPD Maternal Grandfather   . Cancer Maternal Grandfather        prostate  . Asthma Maternal Grandfather   . Asthma Son   . Bronchitis Son     Social History   Socioeconomic History  . Marital status: Single    Spouse name: Not on  file  . Number of children: Not on file  . Years of education: Not on file  . Highest education level: Not on file  Occupational History  . Not on file  Tobacco Use  . Smoking status: Never Smoker  . Smokeless tobacco: Never Used  Vaping Use  . Vaping Use: Never used  Substance and Sexual Activity  . Alcohol use: Not Currently  . Drug use: No  . Sexual activity: Yes    Birth control/protection: Implant  Other Topics Concern  . Not on file  Social History Narrative  . Not on file   Social Determinants of Health   Financial Resource Strain: Low Risk   . Difficulty of Paying Living Expenses: Not hard at all  Food Insecurity: No Food Insecurity  . Worried About Programme researcher, broadcasting/film/video in the Last Year: Never true  . Ran Out of Food in the Last Year: Never true  Transportation Needs: No Transportation Needs  . Lack of Transportation (Medical): No  . Lack of Transportation (Non-Medical): No  Physical Activity: Inactive  . Days of Exercise per Week: 0 days  . Minutes of Exercise per Session: 0 min  Stress: Not on file  Social Connections: Not on file  Intimate Partner Violence: Not on file     Physical Exam   Vitals:   10/05/20 0415  BP: (!) 143/104  Pulse: (!) 116  Resp: 16  Temp: 98.5 F (36.9 C)  SpO2: 100%    CONSTITUTIONAL: Chronically-appearing, NAD NEURO:  Alert and oriented x 3, no focal deficits EYES:  eyes equal and reactive ENT/NECK:  no LAD, no JVD CARDIO: Tachycardic rate, well-perfused, normal S1 and S2 PULM:  CTAB no wheezing or rhonchi GI/GU:  normal bowel sounds, non-distended, non-tender MSK/SPINE:  No gross deformities, no edema SKIN:  no rash, atraumatic PSYCH:  Appropriate speech and behavior  *Additional and/or pertinent findings included in MDM below  Diagnostic and Interventional Summary    EKG Interpretation  Date/Time:  Sunday October 05 2020 04:13:25 EDT Ventricular Rate:  108 PR Interval:  169 QRS Duration: 87 QT  Interval:  353 QTC Calculation: 474 R Axis:   70 Text Interpretation: Sinus tachycardia Consider right atrial enlargement LVH by voltage Confirmed by Nakeeta Sebastiani ([id number]) on 10/05/2020 4:16:10 AM      Labs Reviewed - No data to display  No orders to display    Medications  alum & mag hydroxide-simeth (MAALOX/MYLANTA) 200-200-20 MG/5ML suspension 30 mL (30 mLs Oral Given 10/05/20 0436)     Procedures  /  Critical Care Procedures  ED Course and Medical Decision Making  I have reviewed the triage vital signs, the nursing notes, and pertinent available records from the EMR.  Listed above are laboratory and imaging tests that I personally ordered, reviewed, and interpreted and then considered in my medical decision making (see below for details).  Overall a very anxious patient experiencing chest pain, intermittent elevated blood pressures at home.  Has had numerous work-ups with negative troponins, negative D-dimers.  She has no evidence of DVT on exam, no shortness of breath, no hypoxia, highly doubt DVT.  EKG is largely unchanged.  She has a baseline resting sinus tachycardia, heart rate about 100 on my exam.  I see no benefit or indication for repeat testing today.  Her symptoms actually sound consistent with GERD, she is not taking the Carafate prescribed to her last month, she has not a PPI.  Will prescribe both and advise cardiology follow-up.       Keneth Borg M. Natalio Salois, MD  Emergency Medicine [facility] [email or web address]  Final Clinical Impressions(s) / ED Diagnoses     ICD-10-CM   1. Chest pain, unspecified type  R07.9     ED Discharge Orders         Ordered    sucralfate (CARAFATE) 1 g tablet  4 times daily PRN        10/05/20 0442    omeprazole (PRILOSEC) 20 MG capsule  Daily        04 /10/22 0442           Discharge Instructions Discussed with and Provided to Patient:     Discharge Instructions     You were evaluated in the Emergency  Department and after careful evaluation, we did not find any emergent condition requiring admission or further testing in the hospital.  Your exam/testing today is overall reassuring.  Symptoms may be related to acid reflux.  Please take the omeprazole medication daily to prevent pain.  You can also use the Carafate medication up to 4 times daily throughout the day for discomfort.  Please keep your follow-up with cardiology.  Please return to the Emergency Department if you experience any worsening of your condition.   Thank you for allowing 04-16-1978 to be a part of your care.      [place], MD 10/05/20 [phone]

## 2020-10-06 ENCOUNTER — Telehealth (HOSPITAL_COMMUNITY): Payer: Self-pay

## 2020-10-06 NOTE — Telephone Encounter (Signed)
Arrange followup in pharmacy clinic to go over current meds and check BP.

## 2020-10-06 NOTE — Telephone Encounter (Signed)
Spoke with Tina Fletcher this morning who reports her BP is 117/90 and was inquiring on how to take her medications. We had a discussion regarding her medicines and I assisted with med rec over the phone. Current meds prescribed listed below:   Corlanor 5mg -  1/2 tablet BID Sertraline 25mg - 1 tablet once daily  Megestrol 20mg - 1 tablet once daily HCTZ 25mg - 1/2 tablet once daily  Hydroxyzine 25mg - AS NEEDED  Potassium - 1 tablet once daily   NOT TAKING- Carafate Prilosec    I reminded [name] of her upcoming appointment tomorrow with Dr. and she agreed with same.

## 2020-10-06 NOTE — Telephone Encounter (Signed)
Medications reviewed with paramedicine and pt has an appt with Dr.Klein tomorrow. See note from [facility] with paramedicine below.    "Spoke with [name] this morning who reports her BP is 117/90 and was inquiring on how to take her medications. We had a discussion regarding her medicines and I assisted with med rec over the phone. Current meds prescribed listed below:     Corlanor 5mg -  1/2 tablet BID Sertraline 25mg - 1 tablet once daily  Megestrol 20mg - 1 tablet once daily HCTZ 25mg - 1/2 tablet once daily  Hydroxyzine 25mg - AS NEEDED  Potassium - 1 tablet once daily    NOT TAKING- Carafate Prilosec      I reminded Tina Fletcher of her upcoming appointment tomorrow with Dr. and she agreed with same. "

## 2020-10-07 ENCOUNTER — Encounter (HOSPITAL_COMMUNITY): Payer: Medicaid Other | Admitting: Cardiology

## 2020-10-07 ENCOUNTER — Encounter: Payer: Self-pay | Admitting: Internal Medicine

## 2020-10-07 ENCOUNTER — Ambulatory Visit (INDEPENDENT_AMBULATORY_CARE_PROVIDER_SITE_OTHER): Payer: Medicaid Other | Admitting: Internal Medicine

## 2020-10-07 ENCOUNTER — Other Ambulatory Visit: Payer: Self-pay

## 2020-10-07 VITALS — BP 130/70 | HR 95 | Ht 69.0 in | Wt 112.0 lb

## 2020-10-07 DIAGNOSIS — I5022 Chronic systolic (congestive) heart failure: Secondary | ICD-10-CM

## 2020-10-07 DIAGNOSIS — I951 Orthostatic hypotension: Secondary | ICD-10-CM | POA: Diagnosis not present

## 2020-10-07 DIAGNOSIS — R42 Dizziness and giddiness: Secondary | ICD-10-CM | POA: Diagnosis not present

## 2020-10-07 MED ORDER — METOPROLOL TARTRATE 25 MG PO TABS: 25.0000 mg | ORAL_TABLET | Freq: Two times a day (BID) | ORAL | 3 refills | Status: DC

## 2020-10-07 NOTE — Patient Instructions (Signed)
Medication Instructions:  Your physician has recommended you make the following change in your medication:   ** Begin Metoprolol 25mg  - 1 tablet by mouth twice daily.  *If you need a refill on your cardiac medications before your next appointment, please call your pharmacy*   Lab Work: None ordered.  If you have labs (blood work) drawn today and your tests are completely normal, you will receive your results only by: MyChart Message (if you have MyChart) OR . A paper copy in the mail If you have any lab test that is abnormal or we need to change your treatment, we will call you to review the results.   Testing/Procedures: None ordered.    Follow-Up: At [facility], you and your health needs are our priority.  As part of our continuing mission to provide you with exceptional heart care, we have created designated Provider Care Teams.  These Care Teams include your primary Cardiologist (physician) and Advanced Practice Providers (APPs -  Physician Assistants and Nurse Practitioners) who all work together to provide you with the care you need, when you need it.  We recommend signing up for the patient portal called "MyChart".  Sign up information is provided on this After Visit Summary.  MyChart is used to connect with patients for Virtual Visits (Telemedicine).  Patients are able to view lab/test results, encounter notes, upcoming appointments, etc.  Non-urgent messages can be sent to your provider as well.   To learn more about what you can do with MyChart, go to [facility].    Your next appointment:   Follow up with Dr [email or web address] as needed.

## 2020-10-07 NOTE — Progress Notes (Signed)
ELECTROPHYSIOLOGY CONSULT NOTE  Patient ID: Tina Fletcher, MRN: [id number], DOB/AGE: [birth year] 35 y.o. Admit date: (Not on file) Date of Consult: 10/07/2020  Primary Physician: Hoy Register, MD Primary Cardiologist: DM     Tina Fletcher is a 34 y.o. female who is being seen today for the evaluation of syncope  at the request of DM    HPI Tina Fletcher is a 35 y.o. female referred for syncope in the setting of hypertensive crisis   Initial presentation has been associated with alcohol.  ER attestation 09/14/2020 "acknowledged cessation of heavy alcohol consumption about 1 week ago was presenting with ongoing episodes of lightheadedness, shakiness." Covid infection 9/21  Multiple recent ER evaluations for chest pain and tachypalpitations  08/25/20 "She has been seen 7 more times since then and today is her ninth visit in the last 8 days" 09/30/20 " This is her third ER visit in the last 24 hours.  She was just discharged hours ago from [facility] with similar complaints"  As suggested by the above, there has been a challenging problem of recurrent ER visits for almost always a chest pain syndrome associated with palpitations.  The cause of the chest pain has not been elucidated.  The patient acknowledges that the pain makes her anxious and that this then triggers a cycle of ever greater anxiety.  She returns to the ER sometimes multiple times a day for the same complaint, not withstanding the fact that they have not been able to identify a mechanism.  The symptoms abate and when they recur she returns  She says that she stopped drinking alcohol in February, the ER attestation above suggest that there is some variability in the history.  She denies other drug use  She has 2 children who live at home   DATE TEST EF   11/20 Echo   15 %   11/20 LHC  No obstruc CAD  11/20 cMRI  16 % LGE neg  3.21 Echo  30-35%   1/22 Echo  40-45    Date Cr K Hgb  4/22 0.88 3.7  10.9<<12.6         Event Recorder personnally reviewed   12/21 Rare PAC PVC  3/22 Rare PAC PVC   Past Medical History:  Diagnosis Date  . Abnormal menstrual periods 02/12/2014  . CHF (congestive heart failure) (HCC)   . Herpes simplex without mention of complication   . Hypertension   . Medical history non-contributory   . Migraines   . Nexplanon insertion 08/13/2013   08/13/13 inserted left arm, remove 08/13/16  . Vitamin D deficiency 04/01/2016      Surgical History:  Past Surgical History:  Procedure Laterality Date  . RIGHT/LEFT HEART CATH AND CORONARY ANGIOGRAPHY N/A 05/14/2019   Procedure: RIGHT/LEFT HEART CATH AND CORONARY ANGIOGRAPHY;  Surgeon: Laurey Morale, MD;  Location: [facility] INVASIVE CV LAB;  Service: Cardiovascular;  Laterality: N/A;     Home Meds: Current Meds  Medication Sig  . etonogestrel (NEXPLANON) 68 MG IMPL implant Inject 1 each into the skin continuous.  . hydrochlorothiazide (HYDRODIURIL) 25 MG tablet Take 1 tablet by mouth daily.  . ivabradine (CORLANOR) 5 MG TABS tablet Take 0.5 tablets (2.5 mg total) by mouth 2 (two) times daily with a meal.  . megestrol (MEGACE) 20 MG tablet Take 1 tablet (20 mg total) by mouth daily.  Marland Kitchen omeprazole (PRILOSEC) 20 MG capsule Take 1 capsule (20 mg total) by mouth daily.  Marland Kitchen  potassium chloride SA (KLOR-CON) 20 MEQ tablet Take 1 tablet (20 mEq total) by mouth daily.  . sertraline (ZOLOFT) 25 MG tablet Take 1 tablet (25 mg total) by mouth daily.  . sucralfate (CARAFATE) 1 g tablet Take 1 tablet (1 g total) by mouth 4 (four) times daily as needed.    Allergies:  Allergies  Allergen Reactions  . Fentanyl Other (See Comments)    Headaches    Social History   Socioeconomic History  . Marital status: Single    Spouse name: Not on file  . Number of children: Not on file  . Years of education: Not on file  . Highest education level: Not on file  Occupational History  . Not on file  Tobacco Use  . Smoking status:  Never Smoker  . Smokeless tobacco: Never Used  Vaping Use  . Vaping Use: Never used  Substance and Sexual Activity  . Alcohol use: Not Currently  . Drug use: No  . Sexual activity: Yes    Birth control/protection: Implant  Other Topics Concern  . Not on file  Social History Narrative  . Not on file   Social Determinants of Health   Financial Resource Strain: Low Risk   . Difficulty of Paying Living Expenses: Not hard at all  Food Insecurity: No Food Insecurity  . Worried About Programme researcher, broadcasting/film/video in the Last Year: Never true  . Ran Out of Food in the Last Year: Never true  Transportation Needs: No Transportation Needs  . Lack of Transportation (Medical): No  . Lack of Transportation (Non-Medical): No  Physical Activity: Inactive  . Days of Exercise per Week: 0 days  . Minutes of Exercise per Session: 0 min  Stress: Not on file  Social Connections: Not on file  Intimate Partner Violence: Not on file     Family History  Problem Relation Age of Onset  . Diabetes Maternal Grandmother   . Cancer Paternal Grandfather        prancreatic  . COPD Maternal Grandfather   . Cancer Maternal Grandfather        prostate  . Asthma Maternal Grandfather   . Asthma Son   . Bronchitis Son      ROS:  Please see the history of present illness.     All other systems reviewed and negative.    Physical Exam:  Blood pressure 130/70, pulse 95, height 5\' 9"  (1.753 m), weight 112 lb (50.8 kg), SpO2 97 %. General: Well developed, well nourished female in no acute distress. Head: Normocephalic, atraumatic, sclera non-icteric, no xanthomas, nares are without discharge. EENT: normal  Lymph Nodes:  none Neck: Negative for carotid bruits. JVD not elevated. Back:without scoliosis kyphosis  Lungs: Clear bilaterally to auscultation without wheezes, rales, or rhonchi. Breathing is unlabored. Heart: RRR with S1 S2. no murmur . No rubs, or gallops appreciated. Abdomen: Soft, non-tender,  non-distended with normoactive bowel sounds. No hepatomegaly. No rebound/guarding. No obvious abdominal masses. Msk:  Strength and tone appear normal for age. Extremities: No clubbing or cyanosis. No  edema.  Distal pedal pulses are 2+ and equal bilaterally.  Arachnodactyly Skin: Warm and Dry Neuro: Alert and oriented X 3. CN III-XII intact Grossly normal sensory and motor function . Psych:  Responds to questions appropriately with a normal affect.      Labs: Cardiac Enzymes No results for input(s): CKTOTAL, CKMB, TROPONINI in the last 72 hours. CBC Lab Results  Component Value Date   WBC 4.6 10/02/2020  HGB 10.9 (L) 10/02/2020   HCT 32.5 (L) 10/02/2020   MCV 87.4 10/02/2020   PLT 227 10/02/2020   PROTIME: No results for input(s): LABPROT, INR in the last 72 hours. Chemistry  Recent Labs  Lab 10/02/20 0905  NA 135  K 3.7  CL 103  CO2 18*  BUN 11  CREATININE 0.88  CALCIUM 9.6  PROT 7.4  BILITOT 1.2  ALKPHOS 36*  ALT 20  AST 18  GLUCOSE 80   Lipids Lab Results  Component Value Date   CHOL 180 03/19/2016   HDL 80 03/19/2016   LDLCALC 68 03/19/2016   TRIG 158 (H) 03/19/2016   BNP No results found for: PROBNP Thyroid Function Tests: No results for input(s): TSH, T4TOTAL, T3FREE, THYROIDAB in the last 72 hours.  Invalid input(s): FREET3 Miscellaneous Lab Results  Component Value Date   DDIMER <0.27 06/06/2020    Radiology/Studies:  DG Chest 2 View  Result Date: 09/29/2020 CLINICAL DATA:  Chest pain EXAM: CHEST - 2 VIEW COMPARISON:  One-view chest x-ray 09/28/2020 FINDINGS: The heart size and mediastinal contours are within normal limits. Both lungs are clear. The visualized skeletal structures are unremarkable. IMPRESSION: Negative two view chest x-ray Electronically Signed   By: Marin Roberts M.D.   On: 09/29/2020 16:11   CT Head Wo Contrast  Result Date: 09/30/2020 CLINICAL DATA:  35 year old female with headache, chest pain, hypertensive,  vomiting. EXAM: CT HEAD WITHOUT CONTRAST TECHNIQUE: Contiguous axial images were obtained from the base of the skull through the vertex without intravenous contrast. COMPARISON:  Head CT 02/04/2017. FINDINGS: Brain: No midline shift, ventriculomegaly, mass effect, evidence of mass lesion, intracranial hemorrhage or evidence of cortically based acute infarction. Gray-white matter differentiation is within normal limits throughout the brain. Mega cisterna magna (normal variant). No encephalomalacia identified. Vascular: No suspicious intracranial vascular hyperdensity. Skull: Negative. Sinuses/Orbits: Visualized paranasal sinuses and mastoids are clear. Other: Visualized orbits and scalp soft tissues are within normal limits. IMPRESSION: Stable and normal noncontrast Head CT. Electronically Signed   By: Odessa Fleming M.D.   On: 09/30/2020 06:06   DG Chest Port 1 View  Result Date: 10/02/2020 CLINICAL DATA:  35 year old female with mid chest pain, hypertension and tachycardia EXAM: PORTABLE CHEST 1 VIEW COMPARISON:  None. FINDINGS: The lungs are clear and negative for focal airspace consolidation, pulmonary edema or suspicious pulmonary nodule. No pleural effusion or pneumothorax. Cardiac and mediastinal contours are within normal limits. No acute fracture or lytic or blastic osseous lesions. The visualized upper abdominal bowel gas pattern is unremarkable. IMPRESSION: Normal chest x-ray. Electronically Signed   By: Malachy Moan M.D.   On: 10/02/2020 09:02   DG Chest Portable 1 View  Result Date: 09/28/2020 CLINICAL DATA:  36 year old female with chest pain EXAM: PORTABLE CHEST 1 VIEW COMPARISON:  Chest radiograph dated 09/27/2020 and CT dated 09/27/2020 FINDINGS: The heart size and mediastinal contours are within normal limits. Both lungs are clear. The visualized skeletal structures are unremarkable. IMPRESSION: No active disease. Electronically Signed   By: Elgie Collard M.D.   On: 09/28/2020 18:18   DG  Chest Portable 1 View  Result Date: 09/21/2020 CLINICAL DATA:  Chest pain starting last night EXAM: PORTABLE CHEST 1 VIEW COMPARISON:  Portable exam 1702 hours compared to 09/17/2020 FINDINGS: Normal heart size, mediastinal contours, and pulmonary vascularity. Lungs clear. No pulmonary infiltrate, pleural effusion, or pneumothorax. Osseous structures unremarkable. IMPRESSION: No acute abnormalities. Electronically Signed   By: Ulyses Southward M.D.   On: 09/21/2020  17:22   DG Chest Port 1 View  Result Date: 09/15/2020 CLINICAL DATA:  Pain, chest pain. Nonradiating. History of congestive heart failure and hypertension. EXAM: PORTABLE CHEST 1 VIEW COMPARISON:  Chest x-ray 09/11/2020, CT chest 05/10/2019 FINDINGS: The heart size and mediastinal contours are within normal limits. No focal consolidation. No pulmonary edema. Nonspecific blunting of bilateral costophrenic angles. No pleural effusion. No pneumothorax. No acute osseous abnormality. IMPRESSION: No active disease. Electronically Signed   By: Tish Frederickson M.D.   On: 09/15/2020 20:59   LONG TERM MONITOR (3-14 DAYS)  Result Date: 10/06/2020 Patch Wear Time:  14 days and 0 hours (2022-03-18T11:32:56-0400 to 2022-04-01T11:33:00-0400) Patient had a min HR of 51 bpm, max HR of 144 bpm, and avg HR of 80 bpm. Predominant underlying rhythm was Sinus Rhythm. Isolated SVEs were rare (<1.0%), and no SVE Couplets or SVE Triplets were present. Isolated VEs were rare (<1.0%), VE Couplets were rare (<1.0%), and no VE Triplets were present. Conclusion: 1. Predominantly NSR. 2. 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  I am not  well able to  clarify the history of syncope.  She does describe postural tachycardia but in the context of structural heart disease a diagnosis of POTS cannot be made.  Moreover it rather interestingly, she notes that her blood pressure which she measures many times a day at home, goes up when she stands concomitantly with her elevated heart rate.  The orthostatic vital signs in the chart apart from today's failed to demonstrate a significant change with standing and electrocardiograms are all consistent with sinus tachycardia (I looked at 10-15) based on P wave morphology  The triggers to go to the emergency room are pain and palpitations.  I have suggested to her that the major therapeutic intervention needs to be to interrupt the cycle.  There are variety of potential targets, the first is the pain itself and I wonder whether she would benefit from referral to the pain clinic.  The second is the neuropsychiatric response to whatever the trigger is and I suspect that this is both amplifying and reinforcing and this potentially is our most amenable target.  In this regard I applaud her decision to go to mental health appointment which is scheduled next Tuesday (and for those who on her behalf have made these appointments) In addition, I wonder whether anti adrenergic therapy might help interrupt the self reinforcing and escalating process.  To this end I have prescribed her metoprolol 25 twice daily and asked her to take a half initially.  I have discussed this with Dr. DM.  We are glad to see her in the future as needed.     Wednesday

## 2020-10-09 ENCOUNTER — Telehealth: Payer: Self-pay | Admitting: Internal Medicine

## 2020-10-09 NOTE — Telephone Encounter (Signed)
Spoke with pt who reports she has been taking Metoprolol 25mg  - 1/2 tablet twice daily which has helped her chest pain and heart rate but it has caused her to be hypotensive.  Pt states she has not taken any of the Metoprolol today and BP is 116/83 with HR of 74. Pt advised to continue holding medication. Will forward information to Dr for review and recommendations.

## 2020-10-09 NOTE — Telephone Encounter (Signed)
Pt c/o medication issue:  1. Name of Medication: metoprolol tartrate (LOPRESSOR) 25 MG tablet  2. How are you currently taking this medication (dosage and times per day)? 1 twice a day  3. Are you having a reaction (difficulty breathing--STAT)? no  4. What is your medication issue? Patient called to say that the medication is causing her blood pressure to be low. Pharmacist that comes out to her house took her blood pressure it was 112/39 hr was 81.

## 2020-10-10 MED ORDER — METOPROLOL TARTRATE 25 MG PO TABS: 25.0000 mg | ORAL_TABLET | Freq: Every evening | ORAL | 3 refills | Status: DC

## 2020-10-10 NOTE — Telephone Encounter (Signed)
Spoke with pt and advised per Dr Graciela Husbands stop HCTZ (Hydrochlorothiazide) and take Metoprolol Tartrate 25mg  - 1 tablet by mouth at night.  Continue to monitor BP.  Pt verbalizes understanding and states she will most likely start with trying 1/2 tablet of Metoprolol at night and increase to 1 tablet.  Pt will call if she has any additional problems.

## 2020-10-13 ENCOUNTER — Emergency Department (HOSPITAL_COMMUNITY)
Admission: EM | Admit: 2020-10-13 | Discharge: 2020-10-13 | Disposition: A | Discharge status: Home / Self Care | Payer: Medicaid Other | Attending: Emergency Medicine | Admitting: Emergency Medicine

## 2020-10-13 ENCOUNTER — Other Ambulatory Visit: Payer: Self-pay

## 2020-10-13 ENCOUNTER — Encounter (HOSPITAL_COMMUNITY): Payer: Self-pay | Admitting: *Deleted

## 2020-10-13 DIAGNOSIS — I5023 Acute on chronic systolic (congestive) heart failure: Secondary | ICD-10-CM | POA: Diagnosis not present

## 2020-10-13 DIAGNOSIS — I110 Hypertensive heart disease with heart failure: Secondary | ICD-10-CM | POA: Insufficient documentation

## 2020-10-13 DIAGNOSIS — R079 Chest pain, unspecified: Secondary | ICD-10-CM

## 2020-10-13 DIAGNOSIS — Z79899 Other long term (current) drug therapy: Secondary | ICD-10-CM | POA: Insufficient documentation

## 2020-10-13 NOTE — Discharge Instructions (Addendum)
Call your primary care doctor or specialist as discussed in the next 2-3 days.   Return immediately back to the ER if:  Your symptoms worsen within the next 12-24 hours. You develop new symptoms such as new fevers, persistent vomiting, new pain, shortness of breath, or new weakness or numbness, or if you have any other concerns.  

## 2020-10-13 NOTE — ED Triage Notes (Signed)
Pt c/o right side intermittent chest pain that started yesterday, recently seen for same,

## 2020-10-13 NOTE — ED Provider Notes (Signed)
[facility] EMERGENCY DEPARTMENT Provider Note   CSN: [id number] Arrival date & time: 10/13/20  0941     History Chief Complaint  Patient presents with  . Chest Pain    Tina Fletcher is a 35 y.o. female.  Patient presents to the ER for right-sided chest pain describes a sharp and aching and very intermittent.  She states it comes and goes last about 15 to 20 minutes at a time.  She states that she has been seen by multiple providers for this but no one is told about the causes.  She is followed by cardiology and electrophysiology.  She is on multiple ER visits without a clear diagnosis of her pain.  She states the symptoms occurred again early this morning.  She was told by her cardiologist to start metoprolol but she is has stopped taking the medication as she states that it makes her blood pressure dropped about 100 systolic.  She otherwise denies any fevers or cough any vomiting or diarrhea.  Currently the pain is resolving.        Past Medical History:  Diagnosis Date  . Abnormal menstrual periods 02/12/2014  . CHF (congestive heart failure) (HCC)   . Herpes simplex without mention of complication   . Hypertension   . Medical history non-contributory   . Migraines   . Nexplanon insertion 08/13/2013   08/13/13 inserted left arm, remove 08/13/16  . Vitamin D deficiency 04/01/2016    Patient Active Problem List   Diagnosis Date Noted  . Hypertensive crisis 08/18/2020  . Hypokalemia 08/18/2020  . Prolonged QT interval 08/18/2020  . Syncope   . Chronic systolic CHF (congestive heart failure) (HCC)   . Cervical cancer screening 10/23/2019  . Systolic CHF, acute (HCC) 05/10/2019  . Vitamin D deficiency 04/01/2016  . Nexplanon insertion 08/13/2013  . Thrombocytopenia (HCC) 04/17/2013  . 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 Underweight 01/08/2013    Past Surgical History:  Procedure Laterality Date  . RIGHT/LEFT HEART CATH AND CORONARY ANGIOGRAPHY N/A 05/14/2019    Procedure: RIGHT/LEFT HEART CATH AND CORONARY ANGIOGRAPHY;  Surgeon: Laurey Morale, MD;  Location: [facility] INVASIVE CV LAB;  Service: Cardiovascular;  Laterality: N/A;     OB History    Gravida  2   Para  2   Term  2   Preterm      AB      Living  2     SAB      IAB      Ectopic      Multiple      Live Births  2           Family History  Problem Relation Age of Onset  . Diabetes Maternal Grandmother   . Cancer Paternal Grandfather        prancreatic  . COPD Maternal Grandfather   . Cancer Maternal Grandfather        prostate  . Asthma Maternal Grandfather   . Asthma Son   . Bronchitis Son     Social History   Tobacco Use  . Smoking status: Never Smoker  . Smokeless tobacco: Never Used  Vaping Use  . Vaping Use: Never used  Substance Use Topics  . Alcohol use: Not Currently  . Drug use: No    Home Medications Prior to Admission medications   Medication Sig Start Date End Date Taking? Authorizing Provider  etonogestrel (NEXPLANON) 68 MG IMPL implant Inject 1 each into the skin continuous.  [provider]  ivabradine (CORLANOR) 5 MG TABS tablet Take 0.5 tablets (2.5 mg total) by mouth 2 (two) times daily with a meal. 07/01/20   Laurey Morale, MD  megestrol (MEGACE) 20 MG tablet Take 1 tablet (20 mg total) by mouth daily. 09/15/20   Hoy Register, MD  metoprolol tartrate (LOPRESSOR) 25 MG tablet Take 1 tablet (25 mg total) by mouth at bedtime. 10/10/20   Duke Salvia, MD  omeprazole (PRILOSEC) 20 MG capsule Take 1 capsule (20 mg total) by mouth daily. 10/05/20   Sabas Sous, MD  potassium chloride SA (KLOR-CON) 20 MEQ tablet Take 1 tablet (20 mEq total) by mouth daily. 08/22/20   Laurey Morale, MD  sertraline (ZOLOFT) 25 MG tablet Take 1 tablet (25 mg total) by mouth daily. 08/22/20 08/22/21  Laurey Morale, MD  sucralfate (CARAFATE) 1 g tablet Take 1 tablet (1 g total) by mouth 4 (four) times daily as needed. 10/05/20   Sabas Sous, MD    Allergies    Fentanyl  Review of Systems   Review of Systems  Constitutional: Negative for fever.  HENT: Negative for ear pain.   Eyes: Negative for pain.  Respiratory: Negative for cough.   Cardiovascular: Positive for chest pain.  Gastrointestinal: Negative for abdominal pain.  Genitourinary: Negative for flank pain.  Musculoskeletal: Negative for back pain.  Skin: Negative for rash.  Neurological: Negative for headaches.    Physical Exam Updated Vital Signs BP 119/86   Pulse 83   Temp 98.3 F (36.8 C)   Resp 16   Ht 5\' 9"  (1.753 m)   Wt 50.8 kg   SpO2 98%   BMI 16.54 kg/m   Physical Exam Constitutional:      General: She is not in acute distress.    Appearance: Normal appearance.  HENT:     Head: Normocephalic.     Nose: Nose normal.  Eyes:     Extraocular Movements: Extraocular movements intact.  Cardiovascular:     Rate and Rhythm: Normal rate.  Pulmonary:     Effort: Pulmonary effort is normal.  Musculoskeletal:        General: Normal range of motion.     Cervical back: Normal range of motion.  Neurological:     General: No focal deficit present.     Mental Status: She is alert. Mental status is at baseline.     ED Results / Procedures / Treatments   Labs (all labs ordered are listed, but only abnormal results are displayed) Labs Reviewed - No data to display  EKG EKG Interpretation  Date/Time:  Monday October 13 2020 09:56:53 EDT Ventricular Rate:  100 PR Interval:  182 QRS Duration: 99 QT Interval:  372 QTC Calculation: 480 R Axis:   69 Text Interpretation: Sinus tachycardia Borderline prolonged QT interval Baseline wander in lead(s) V5 Confirmed by 09-26-2002 ([id number]) on 10/13/2020 10:10:48 AM   Radiology No results found.  Procedures Procedures   Medications Ordered in ED Medications - No data to display  ED Course  I have reviewed the triage vital signs and the nursing notes.  Pertinent labs & imaging results that  were available during my care of the patient were reviewed by me and considered in my medical decision making (see chart for details).    MDM Rules/Calculators/A&P                          Initial  vital signs show heart rate between 70 to 89 bpm.  However EKG performed heart rate is 100 bpm.  No ST elevations depressions no T wave inversions noted.  Patient does become anxious when I entered the room and speak with her heart rate appears to elevate.  When we perform diagnostic testing such as EKG heart rate appears to elevate as well.  I have very low suspicion for acute coronary syndrome pneumonia pulmonary embolism or other differentials considered.  Advising her to continue to take metoprolol, if it drops her blood pressure less than 90 systolic to take just half a tablet.  Advised her to call her cardiology discussed this with them within the next 2 to 3 days.  Advising immediate return for worsening symptoms or any additional concerns.  Final Clinical Impression(s) / ED Diagnoses Final diagnoses:  Nonspecific chest pain    Rx / DC Orders ED Discharge Orders    None       Cheryll Cockayne, MD 10/13/20 1025

## 2020-10-14 ENCOUNTER — Other Ambulatory Visit: Payer: Self-pay | Admitting: Family Medicine

## 2020-10-14 ENCOUNTER — Other Ambulatory Visit (HOSPITAL_COMMUNITY): Payer: Self-pay | Admitting: Cardiology

## 2020-10-14 DIAGNOSIS — R636 Underweight: Secondary | ICD-10-CM

## 2020-10-14 NOTE — Telephone Encounter (Signed)
Requested medications are due for refill today.  yes  Requested medications are on the active medications list.  yes  Last refill. 09/15/2020  Future visit scheduled.   yes  Notes to clinic.  Unclear if this is supposed to be an ongoing medication.

## 2020-10-15 ENCOUNTER — Encounter (HOSPITAL_COMMUNITY): Payer: Self-pay

## 2020-10-15 ENCOUNTER — Institutional Professional Consult (permissible substitution): Payer: Medicaid Other | Admitting: Cardiology

## 2020-10-17 ENCOUNTER — Telehealth (HOSPITAL_COMMUNITY): Payer: Self-pay

## 2020-10-17 NOTE — Telephone Encounter (Signed)
Spoke to [place] she reports doing well on her current medication regimen and has no complaints of chest pain, dizziness, or near syncopal episodes. Lidia is aware of upcoming appointments.   BP was 104/76 and HR was 67 at 0823.

## 2020-10-21 ENCOUNTER — Encounter (HOSPITAL_COMMUNITY): Payer: Medicaid Other

## 2020-10-22 ENCOUNTER — Other Ambulatory Visit: Payer: Self-pay

## 2020-10-22 ENCOUNTER — Emergency Department (HOSPITAL_COMMUNITY)
Admission: EM | Admit: 2020-10-22 | Discharge: 2020-10-22 | Disposition: A | Discharge status: Home / Self Care | Payer: Medicaid Other | Attending: Emergency Medicine | Admitting: Emergency Medicine

## 2020-10-22 ENCOUNTER — Encounter (HOSPITAL_COMMUNITY): Payer: Self-pay | Admitting: Emergency Medicine

## 2020-10-22 DIAGNOSIS — R079 Chest pain, unspecified: Secondary | ICD-10-CM | POA: Diagnosis present

## 2020-10-22 DIAGNOSIS — I5023 Acute on chronic systolic (congestive) heart failure: Secondary | ICD-10-CM | POA: Insufficient documentation

## 2020-10-22 DIAGNOSIS — Z79899 Other long term (current) drug therapy: Secondary | ICD-10-CM | POA: Insufficient documentation

## 2020-10-22 DIAGNOSIS — G8929 Other chronic pain: Secondary | ICD-10-CM | POA: Diagnosis not present

## 2020-10-22 DIAGNOSIS — I110 Hypertensive heart disease with heart failure: Secondary | ICD-10-CM | POA: Insufficient documentation

## 2020-10-22 NOTE — ED Triage Notes (Signed)
History of CHF.  Woke up at 8 am with chest pain to left upper chest, rates pain 7/10.  Sharp intermittent pain.  Denies SOB. Non radiating.  No cough.

## 2020-10-22 NOTE — Discharge Instructions (Addendum)
This chest pain appears to be the pain you have been having previously.  It does not look like it has a life-threatening cause.  Continue to follow-up with your doctors as needed.

## 2020-10-22 NOTE — ED Provider Notes (Signed)
[facility] EMERGENCY DEPARTMENT Provider Note   CSN: [id number] Arrival date & time: 10/22/20  1025     History Chief Complaint  Patient presents with  . Chest Pain    Tina Fletcher is a 35 y.o. female.  HPI Patient presents with chest pain.  Left upper chest.  Somewhat sharp.  History of same.  This is her ninth visit to the ER this month for the same.  Has had extensive work-up without clear cause found.  Does have a history of CHF.  Appears to be nonischemic.  He has had a heart cath previously did not show coronary artery disease.  Ejection fraction had been down to 15% but improved to 40%.  At times will feel her heart race.  Particularly when she stands up.  Has been seen by cardiology without clear cause found.  Has worn a recent Zio patch without arrhythmia.  Woke up with the pain this morning.  Similar to pain she is have.  States it is also at the site where she was wearing the patch earlier.  Patient wonders if there could be an anxiety component to this 2.  No shortness of breath.  Patient states she took her medicines this morning.    Past Medical History:  Diagnosis Date  . Abnormal menstrual periods 02/12/2014  . CHF (congestive heart failure) (HCC)   . Herpes simplex without mention of complication   . Hypertension   . Medical history non-contributory   . Migraines   . Nexplanon insertion 08/13/2013   08/13/13 inserted left arm, remove 08/13/16  . Vitamin D deficiency 04/01/2016    Patient Active Problem List   Diagnosis Date Noted  . Hypertensive crisis 08/18/2020  . Hypokalemia 08/18/2020  . Prolonged QT interval 08/18/2020  . Syncope   . Chronic systolic CHF (congestive heart failure) (HCC)   . Cervical cancer screening 10/23/2019  . Systolic CHF, acute (HCC) 05/10/2019  . Vitamin D deficiency 04/01/2016  . Nexplanon insertion 08/13/2013  . Thrombocytopenia (HCC) 04/17/2013  . 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 Underweight 01/08/2013    Past  Surgical History:  Procedure Laterality Date  . RIGHT/LEFT HEART CATH AND CORONARY ANGIOGRAPHY N/A 05/14/2019   Procedure: RIGHT/LEFT HEART CATH AND CORONARY ANGIOGRAPHY;  Surgeon: Laurey Morale, MD;  Location: [facility] INVASIVE CV LAB;  Service: Cardiovascular;  Laterality: N/A;     OB History    Gravida  2   Para  2   Term  2   Preterm      AB      Living  2     SAB      IAB      Ectopic      Multiple      Live Births  2           Family History  Problem Relation Age of Onset  . Diabetes Maternal Grandmother   . Cancer Paternal Grandfather        prancreatic  . COPD Maternal Grandfather   . Cancer Maternal Grandfather        prostate  . Asthma Maternal Grandfather   . Asthma Son   . Bronchitis Son     Social History   Tobacco Use  . Smoking status: Never Smoker  . Smokeless tobacco: Never Used  Vaping Use  . Vaping Use: Never used  Substance Use Topics  . Alcohol use: Not Currently  . Drug use: No    Home Medications Prior to  Admission medications   Medication Sig Start Date End Date Taking? Authorizing Provider  etonogestrel (NEXPLANON) 68 MG IMPL implant Inject 1 each into the skin continuous.    [provider]  ivabradine (CORLANOR) 5 MG TABS tablet Take 0.5 tablets (2.5 mg total) by mouth 2 (two) times daily with a meal. 07/01/20   Laurey Morale, MD  megestrol (MEGACE) 20 MG tablet TAKE 1 TABLET BY MOUTH ONCE A DAY. 10/15/20   Hoy Register, MD  metoprolol tartrate (LOPRESSOR) 25 MG tablet Take 1 tablet (25 mg total) by mouth at bedtime. 10/10/20   Duke Salvia, MD  omeprazole (PRILOSEC) 20 MG capsule Take 1 capsule (20 mg total) by mouth daily. 10/05/20   Sabas Sous, MD  potassium chloride (KLOR-CON) 10 MEQ tablet TAKE 2 TABLETS DAILY. 10/15/20   Laurey Morale, MD  potassium chloride SA (KLOR-CON) 20 MEQ tablet Take 1 tablet (20 mEq total) by mouth daily. 08/22/20   Laurey Morale, MD  sertraline (ZOLOFT) 25 MG tablet Take  1 tablet (25 mg total) by mouth daily. 08/22/20 08/22/21  Laurey Morale, MD  sucralfate (CARAFATE) 1 g tablet Take 1 tablet (1 g total) by mouth 4 (four) times daily as needed. 10/05/20   Sabas Sous, MD    Allergies    Fentanyl  Review of Systems   Review of Systems  Constitutional: Negative for diaphoresis and fever.  HENT: Negative for congestion.   Respiratory: Negative for shortness of breath.   Cardiovascular: Positive for chest pain. Negative for palpitations.  Genitourinary: Negative for dysuria.  Musculoskeletal: Negative for back pain.  Skin: Negative for pallor and wound.  Neurological: Negative for weakness.  Psychiatric/Behavioral: Negative for confusion.    Physical Exam Updated Vital Signs BP (!) 126/93 (BP Location: Right Arm)   Pulse 71   Temp 98.2 F (36.8 C) (Oral)   Resp 18   Ht 5\' 9"  (1.753 m)   Wt 54.4 kg   SpO2 100%   BMI 17.72 kg/m   Physical Exam Vitals and nursing note reviewed.  HENT:     Head: Atraumatic.  Cardiovascular:     Rate and Rhythm: Normal rate and regular rhythm.     Heart sounds: No murmur heard.   Pulmonary:     Breath sounds: No wheezing or rhonchi.  Chest:     Chest wall: No tenderness.  Abdominal:     Tenderness: There is no abdominal tenderness.  Musculoskeletal:     Cervical back: Neck supple.     Right lower leg: No edema.     Left lower leg: No edema.  Skin:    Capillary Refill: Capillary refill takes less than 2 seconds.  Neurological:     Mental Status: She is alert and oriented to person, place, and time.     ED Results / Procedures / Treatments   Labs (all labs ordered are listed, but only abnormal results are displayed) Labs Reviewed - No data to display  EKG EKG Interpretation  Date/Time:  Wednesday October 22 2020 10:38:49 EDT Ventricular Rate:  72 PR Interval:  154 QRS Duration: 110 QT Interval:  433 QTC Calculation: 474 R Axis:   73 Text Interpretation: Sinus rhythm No significant  change since last tracing Confirmed by 10-29-1985 [phone]) on 10/22/2020 10:44:08 AM   Radiology No results found.  Procedures Procedures   Medications Ordered in ED Medications - No data to display  ED Course  I have reviewed the triage vital  signs and the nursing notes.  Pertinent labs & imaging results that were available during my care of the patient were reviewed by me and considered in my medical decision making (see chart for details).    MDM Rules/Calculators/A&P                          Patient with acute on chronic chest pain.  Anterior chest.  Sharp.  EKG reassuring and stable.  Reviewed previous work-up.  Has had heart catheterization that was negative for coronary artery disease.  Has had echocardiograms that are been improving.  Has 1 recent cardiac patch.  Lungs clear.  Likely an anxiety component on this 2.  Not an arrhythmia at this time.  I think patient is stable for discharge home with outpatient follow-up. Initial differential diagnosis included pneumonia, acute MI, pneumothorax, Boerhaave's. Final Clinical Impression(s) / ED Diagnoses Final diagnoses:  Nonspecific chest pain    Rx / DC Orders ED Discharge Orders    None       Benjiman Core, MD 10/22/20 1058

## 2020-10-23 ENCOUNTER — Emergency Department (HOSPITAL_COMMUNITY): Payer: Medicaid Other

## 2020-10-23 ENCOUNTER — Other Ambulatory Visit: Payer: Self-pay

## 2020-10-23 ENCOUNTER — Emergency Department (HOSPITAL_COMMUNITY)
Admission: EM | Admit: 2020-10-23 | Discharge: 2020-10-24 | Disposition: A | Discharge status: Home / Self Care | Payer: Medicaid Other | Attending: Emergency Medicine | Admitting: Emergency Medicine

## 2020-10-23 ENCOUNTER — Encounter (HOSPITAL_COMMUNITY): Payer: Self-pay | Admitting: Emergency Medicine

## 2020-10-23 DIAGNOSIS — Z79899 Other long term (current) drug therapy: Secondary | ICD-10-CM | POA: Insufficient documentation

## 2020-10-23 DIAGNOSIS — I5023 Acute on chronic systolic (congestive) heart failure: Secondary | ICD-10-CM | POA: Diagnosis not present

## 2020-10-23 DIAGNOSIS — R072 Precordial pain: Secondary | ICD-10-CM | POA: Diagnosis not present

## 2020-10-23 DIAGNOSIS — I110 Hypertensive heart disease with heart failure: Secondary | ICD-10-CM | POA: Insufficient documentation

## 2020-10-23 DIAGNOSIS — R002 Palpitations: Secondary | ICD-10-CM | POA: Insufficient documentation

## 2020-10-23 DIAGNOSIS — R079 Chest pain, unspecified: Secondary | ICD-10-CM

## 2020-10-23 LAB — CBC
HCT: 31.5 % — ABNORMAL LOW (ref 36.0–46.0)
Hemoglobin: 10.2 g/dL — ABNORMAL LOW (ref 12.0–15.0)
MCH: 28.7 pg (ref 26.0–34.0)
MCHC: 32.4 g/dL (ref 30.0–36.0)
MCV: 88.7 fL (ref 80.0–100.0)
Platelets: 233 10*3/uL (ref 150–400)
RBC: 3.55 MIL/uL — ABNORMAL LOW (ref 3.87–5.11)
RDW: 13.1 % (ref 11.5–15.5)
WBC: 4.8 10*3/uL (ref 4.0–10.5)
nRBC: 0 % (ref 0.0–0.2)

## 2020-10-23 NOTE — ED Triage Notes (Signed)
Pt c/o chest pain that started about 1 hr PTA, states the pain is consistent with her normal chest pain. Pt also states she began to have headache when the chest pain started.

## 2020-10-24 LAB — BASIC METABOLIC PANEL
Anion gap: 7 (ref 5–15)
BUN: 13 mg/dL (ref 6–20)
CO2: 23 mmol/L (ref 22–32)
Calcium: 8.8 mg/dL — ABNORMAL LOW (ref 8.9–10.3)
Chloride: 106 mmol/L (ref 98–111)
Creatinine, Ser: 0.84 mg/dL (ref 0.44–1.00)
GFR, Estimated: >60 mL/min (ref >60)
Glucose, Bld: 115 mg/dL — ABNORMAL HIGH (ref 70–99)
Potassium: 3.8 mmol/L (ref 3.5–5.1)
Sodium: 136 mmol/L (ref 135–145)

## 2020-10-24 LAB — TROPONIN I (HIGH SENSITIVITY): Troponin I (High Sensitivity): <2 ng/L (ref <18)

## 2020-10-24 NOTE — ED Provider Notes (Signed)
[facility] EMERGENCY DEPARTMENT Provider Note   CSN: [id number] Arrival date & time: 10/23/20  2233     History Chief Complaint  Patient presents with  . Chest Pain    Tina Fletcher is a 35 y.o. female.  Patient is a 35 year old female with history of congestive heart failure and chronic chest pain.  Patient presents today for evaluation of chest discomfort.  This started earlier this evening in the absence of any injury or trauma.  She tells me she woke up this morning and her blood pressure was low, so she skipped her dose of metoprolol.  She is now experiencing palpitations and discomfort in her chest.  Patient has been seen nearly 10 times in the month of April with similar complaints.  She is followed by Dr. Shirlee Latch from cardiology.  The history is provided by the patient.  Chest Pain Pain location:  Substernal area Pain quality: tightness   Pain radiates to:  Does not radiate Pain severity:  Moderate Onset quality:  Sudden Duration:  3 hours Timing:  Constant Progression:  Partially resolved Chronicity:  Recurrent Relieved by:  Nothing Worsened by:  Nothing      Past Medical History:  Diagnosis Date  . Abnormal menstrual periods 02/12/2014  . CHF (congestive heart failure) (HCC)   . Herpes simplex without mention of complication   . Hypertension   . Medical history non-contributory   . Migraines   . Nexplanon insertion 08/13/2013   08/13/13 inserted left arm, remove 08/13/16  . Vitamin D deficiency 04/01/2016    Patient Active Problem List   Diagnosis Date Noted  . Hypertensive crisis 08/18/2020  . Hypokalemia 08/18/2020  . Prolonged QT interval 08/18/2020  . Syncope   . Chronic systolic CHF (congestive heart failure) (HCC)   . Cervical cancer screening 10/23/2019  . Systolic CHF, acute (HCC) 05/10/2019  . Vitamin D deficiency 04/01/2016  . Nexplanon insertion 08/13/2013  . Thrombocytopenia (HCC) 04/17/2013  . 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  Underweight 01/08/2013    Past Surgical History:  Procedure Laterality Date  . RIGHT/LEFT HEART CATH AND CORONARY ANGIOGRAPHY N/A 05/14/2019   Procedure: RIGHT/LEFT HEART CATH AND CORONARY ANGIOGRAPHY;  Surgeon: Laurey Morale, MD;  Location: [facility] INVASIVE CV LAB;  Service: Cardiovascular;  Laterality: N/A;     OB History    Gravida  2   Para  2   Term  2   Preterm      AB      Living  2     SAB      IAB      Ectopic      Multiple      Live Births  2           Family History  Problem Relation Age of Onset  . Diabetes Maternal Grandmother   . Cancer Paternal Grandfather        prancreatic  . COPD Maternal Grandfather   . Cancer Maternal Grandfather        prostate  . Asthma Maternal Grandfather   . Asthma Son   . Bronchitis Son     Social History   Tobacco Use  . Smoking status: Never Smoker  . Smokeless tobacco: Never Used  Vaping Use  . Vaping Use: Never used  Substance Use Topics  . Alcohol use: Not Currently  . Drug use: No    Home Medications Prior to Admission medications   Medication Sig Start Date End Date Taking? Authorizing Provider  etonogestrel (NEXPLANON) 68 MG IMPL implant Inject 1 each into the skin continuous.    [provider]  ivabradine (CORLANOR) 5 MG TABS tablet Take 0.5 tablets (2.5 mg total) by mouth 2 (two) times daily with a meal. 07/01/20   Laurey Morale, MD  megestrol (MEGACE) 20 MG tablet TAKE 1 TABLET BY MOUTH ONCE A DAY. 10/15/20   Hoy Register, MD  metoprolol tartrate (LOPRESSOR) 25 MG tablet Take 1 tablet (25 mg total) by mouth at bedtime. 10/10/20   Duke Salvia, MD  omeprazole (PRILOSEC) 20 MG capsule Take 1 capsule (20 mg total) by mouth daily. 10/05/20   Sabas Sous, MD  potassium chloride (KLOR-CON) 10 MEQ tablet TAKE 2 TABLETS DAILY. 10/15/20   Laurey Morale, MD  potassium chloride SA (KLOR-CON) 20 MEQ tablet Take 1 tablet (20 mEq total) by mouth daily. 08/22/20   Laurey Morale, MD   sertraline (ZOLOFT) 25 MG tablet Take 1 tablet (25 mg total) by mouth daily. 08/22/20 08/22/21  Laurey Morale, MD  sucralfate (CARAFATE) 1 g tablet Take 1 tablet (1 g total) by mouth 4 (four) times daily as needed. 10/05/20   Sabas Sous, MD    Allergies    Fentanyl  Review of Systems   Review of Systems  Cardiovascular: Positive for chest pain.  All other systems reviewed and are negative.   Physical Exam Updated Vital Signs BP (!) 135/97 (BP Location: Right Arm)   Pulse 71   Temp 98.4 F (36.9 C) (Oral)   Resp 18   Ht 5\' 9"  (1.753 m)   Wt 54.4 kg   SpO2 100%   BMI 17.72 kg/m   Physical Exam Vitals and nursing note reviewed.  Constitutional:      General: She is not in acute distress.    Appearance: She is well-developed. She is not diaphoretic.  HENT:     Head: Normocephalic and atraumatic.  Cardiovascular:     Rate and Rhythm: Normal rate and regular rhythm.     Heart sounds: No murmur heard. No friction rub. No gallop.   Pulmonary:     Effort: Pulmonary effort is normal. No respiratory distress.     Breath sounds: Normal breath sounds. No wheezing.  Abdominal:     General: Bowel sounds are normal. There is no distension.     Palpations: Abdomen is soft.     Tenderness: There is no abdominal tenderness.  Musculoskeletal:        General: Normal range of motion.     Cervical back: Normal range of motion and neck supple.     Right lower leg: No tenderness. No edema.     Left lower leg: No tenderness. No edema.  Skin:    General: Skin is warm and dry.  Neurological:     Mental Status: She is alert and oriented to person, place, and time.     ED Results / Procedures / Treatments   Labs (all labs ordered are listed, but only abnormal results are displayed) Labs Reviewed  BASIC METABOLIC PANEL - Abnormal; Notable for the following components:      Result Value   Glucose, Bld 115 (*)    Calcium 8.8 (*)    All other components within normal limits  CBC  - Abnormal; Notable for the following components:   RBC 3.55 (*)    Hemoglobin 10.2 (*)    HCT 31.5 (*)    All other components within normal limits  POC  URINE PREG, ED  TROPONIN I (HIGH SENSITIVITY)    EKG EKG Interpretation  Date/Time:  Thursday October 23 2020 23:25:08 EDT Ventricular Rate:  67 PR Interval:  160 QRS Duration: 90 QT Interval:  446 QTC Calculation: 471 R Axis:   81 Text Interpretation: Normal sinus rhythm Minimal voltage criteria for LVH, may be normal variant ( Sokolow-Lyon ) Borderline ECG No significant change since 10/22/2020 Confirmed by Geoffery Lyons ([id number]) on 10/24/2020 12:24:18 AM   Radiology DG Chest 2 View  Result Date: 10/23/2020 CLINICAL DATA:  Chest pain.  Weakness and dizziness. EXAM: CHEST - 2 VIEW COMPARISON:  Most recent radiograph 10/05/2020. Most recent CT 09/27/2020, most recent prior imaging at [facility]. FINDINGS: The cardiomediastinal contours are normal. The lungs are clear. Pulmonary vasculature is normal. No consolidation, pleural effusion, or pneumothorax. No acute osseous abnormalities are seen. IMPRESSION: Negative radiographs of the chest. Electronically Signed   By: Narda Rutherford M.D.   On: 10/23/2020 23:47    Procedures Procedures   Medications Ordered in ED Medications - No data to display  ED Course  I have reviewed the triage vital signs and the nursing notes.  Pertinent labs & imaging results that were available during my care of the patient were reviewed by me and considered in my medical decision making (see chart for details).    MDM Rules/Calculators/A&P  Patient presenting with complaints of chest pain as described in the HPI.  Patient has been seen on multiple occasions with similar complaints.  All of her work-ups have been unremarkable.  This evening's work-up is also unremarkable including troponin, chest x-ray, and EKG.  Her vital signs are very stable and normal.  I see no indication to perform any further  work-up.  She will be referred back to her cardiologist to discuss.  Final Clinical Impression(s) / ED Diagnoses Final diagnoses:  None    Rx / DC Orders ED Discharge Orders    None       Geoffery Lyons, MD 10/24/20 [phone]

## 2020-10-24 NOTE — Discharge Instructions (Addendum)
Continue medications as previously prescribed.  Follow-up with Dr. Shirlee Latch in the next few days.

## 2020-10-28 ENCOUNTER — Encounter (HOSPITAL_COMMUNITY): Payer: Self-pay

## 2020-10-28 ENCOUNTER — Other Ambulatory Visit: Payer: Self-pay

## 2020-10-28 ENCOUNTER — Other Ambulatory Visit (HOSPITAL_COMMUNITY): Payer: Self-pay

## 2020-10-28 ENCOUNTER — Telehealth (HOSPITAL_COMMUNITY): Payer: Self-pay

## 2020-10-28 ENCOUNTER — Ambulatory Visit (HOSPITAL_COMMUNITY)
Admission: RE | Admit: 2020-10-28 | Discharge: 2020-10-28 | Disposition: A | Discharge status: Home / Self Care | Payer: Medicaid Other | Source: Ambulatory Visit | Attending: Adult Health | Admitting: Adult Health

## 2020-10-28 VITALS — BP 124/80 | HR 88 | Wt 114.8 lb

## 2020-10-28 DIAGNOSIS — F1011 Alcohol abuse, in remission: Secondary | ICD-10-CM | POA: Diagnosis not present

## 2020-10-28 DIAGNOSIS — R42 Dizziness and giddiness: Secondary | ICD-10-CM | POA: Diagnosis not present

## 2020-10-28 DIAGNOSIS — Z8616 Personal history of COVID-19: Secondary | ICD-10-CM | POA: Diagnosis not present

## 2020-10-28 DIAGNOSIS — Z79899 Other long term (current) drug therapy: Secondary | ICD-10-CM | POA: Insufficient documentation

## 2020-10-28 DIAGNOSIS — I428 Other cardiomyopathies: Secondary | ICD-10-CM

## 2020-10-28 DIAGNOSIS — Z885 Allergy status to narcotic agent status: Secondary | ICD-10-CM | POA: Insufficient documentation

## 2020-10-28 DIAGNOSIS — E875 Hyperkalemia: Secondary | ICD-10-CM | POA: Insufficient documentation

## 2020-10-28 DIAGNOSIS — I951 Orthostatic hypotension: Secondary | ICD-10-CM | POA: Insufficient documentation

## 2020-10-28 DIAGNOSIS — I5022 Chronic systolic (congestive) heart failure: Secondary | ICD-10-CM | POA: Diagnosis present

## 2020-10-28 DIAGNOSIS — I10 Essential (primary) hypertension: Secondary | ICD-10-CM | POA: Diagnosis not present

## 2020-10-28 DIAGNOSIS — F419 Anxiety disorder, unspecified: Secondary | ICD-10-CM | POA: Insufficient documentation

## 2020-10-28 DIAGNOSIS — I110 Hypertensive heart disease with heart failure: Secondary | ICD-10-CM | POA: Insufficient documentation

## 2020-10-28 DIAGNOSIS — I429 Cardiomyopathy, unspecified: Secondary | ICD-10-CM | POA: Insufficient documentation

## 2020-10-28 MED ORDER — METOPROLOL TARTRATE 25 MG PO TABS: 12.5000 mg | ORAL_TABLET | Freq: Every evening | ORAL | 3 refills | Status: DC

## 2020-10-28 NOTE — Progress Notes (Signed)
Paramedicine Encounter    Patient ID: Tina Fletcher, female    DOB: 1985-10-15, 35 y.o.   MRN: [id number]   Met with Tina Fletcher today in clinic with Tina Grinder, PA. Tina Fletcher reports feeling good with no complaints today. Tina Fletcher reviewed medications and verified same decreasing metoprolol to 1/2 tablet at bedtime. I filled pill box accordingly. Tina Fletcher knows how to fill her own box as we reviewed same. I plan to see Tina Fletcher in two weeks for home visit. Tina Fletcher has a PCP visit on 5/24 and will see Dr. Aundra Dubin in 3 months. Clinic visit complete.     ACTION: Home visit completed Next visit planned for two weeks

## 2020-10-28 NOTE — Patient Instructions (Signed)
Great to see you!   Please take Lopressor 12.5 mg once nightly.

## 2020-10-28 NOTE — Telephone Encounter (Signed)
Tina Fletcher confirmed she will be at appointment today for 0930. She was informed to bring all medications to visit. I will meet her there. Call complete.

## 2020-10-28 NOTE — Progress Notes (Signed)
Advanced Heart Failure Clinic Note   Referring Physician: PCP: [facility], [facility] PCP-Cardiologist: Dr. Shirlee Fletcher    HPI: 35 y.o. female with past history of HTN presented to [facility] on 05/10/19 with cough, SOB, LE swelling, orthopnea, bendopnea and 20lb weight gain over 3 months. She had a history of increased alcohol use over the several months prior with 7-8 airplane bottles of liquor 3-4 days per week. BNP was elevated and CTA showed pulmonary edema. Echo showed new onset systolic HF with EF 15% and decreased RV systolic function. Cardiology was consulted and she was transferred to [facility].   While admitted she was diuresed with IV lasix. Right and Left heart cath were done. LHC showed no significant coronary artery disease and RHC was within normal limits. Her cardiomyopathy is possibly secondary to recent increased alcohol use or uncontrolled HTN. Viral myocarditis was felt to be a possible etiology with recent cough, however cMRI showed no myocardial LGE, so no definitive evidence for prior MI, infiltrative disease, or myocarditis.  Also of note, she has no family history of cardiomyopathy, negative HIV, and TSH was normal.   Echo in 3/21 showed EF 30-35%, global hypokinesis, mild RV dilation with normal function, mild MR.   On 06/06/20, she had a CBD gummy with a high dosage of CBD in it.  Shortly afterwards, she became lightheaded and dizzy and actually passed out.  She had been out of her meds for several days and her BP was elevated when she went to the ER.  HS-TnI and D dimer were normal. She was discharged home.   Echo in 1/22 showed EF up to 40-45%. She wore a Zio patch in 1/22 with no significant events but only kept it on for about 4 days.   In 2/22, she was admitted with hypertensive urgency with syncope.  BP 218/110.  She had not been taking her evening BP med doses.  BP was controlled and she was discharged.  Later in 2/22, she went to the ER with chest pain.  Troponin  was negative but BP was high.  She was sent home.   Evaluted in clinic, 08/22/20 and was hypertensive. Also very anxious. EKG showed NSR w/ mildly prolonged QTc 474 ms. Labs reviewed and showed hypokalemia. KCl 20 daily was added. Amlodipine 5 mg daily also added. Dr. Shirlee Fletcher also started sertraline 25 mg daily for marked anxiety.   Had return clinic f/u on 3/1. Just prior to that had been seen in the ED on 2/27 for continued anxiety and palpitations. She reported the ED physician gave her something for possible withdrawals, which had helped significantly. She had quit drinking 8 days prior.  BMP was checked on 2/27 and hypokalemia had resolved, K 3.5 (recently as low at 2.9). SCr ok at 0.79. She continued w/ frequent episodes of low BP at home and dizziness as well as fatigue. Coreg was reduced to 3.125 bid and amlodipine discontinued.   She was evaluated 11 times at different EDs for chest pain.  No cardiac findings. Felt to be due to anxiety.   Today she returns for HF follow up.Overall feeling fine. Denies SOB/PND/Orthopnea. Appetite ok. No fever or chills. Weight at home  pounds. Taking all medications. Says she is usually taking metoprolol once a day due to soft BP.  Followed by HF Paramedicine. Lives with her husband and 2 children.    Labs (11/20): K 3.8, creatinine 1.1 Labs (2/21): K 3.6, creatinine 0.89 Labs (12/21): BNP 145, K 3.7, creatinine  0.67, hgb 12.2 Labs (2/22): K 3.4, creatinine 0.79, hgb 11.5 Labs (2/22): K 3.5, Creatinine 0.79 Labs (3/22): K 3.8, Creatinine 0.76  PMH: 1. HTN - Renal artery dopplers (3/21): No evidence for renal artery stenosis.  2. ETOH abuse: Has quit.  3. Chronic systolic CHF: Nonischemic cardiomyopathy.  - LHC/RHC (11/20): No significant CAD; mean RA 1, PA 24/8, mean PCWP 5, CI 3.54.  - Echo (11/20): EF 15%, diffuse hypokinesis, mildly decreased RV systolic function.  - CMRI (11/20): LV mildly dilated with EF 16%, normal RV size with EF 28%, no  myocardial LGE.  - Echo (3/21): EF 30-35%, global hypokinesis, mild RV dilation with normal function, mild MR.  - Echo (1/22): EF 40-45%, normal RV.  4. COVID-19 infection 9/21 5. Prolonged QT interval  Review of Systems: All systems reviewed and negative except as per HPI.    Current Outpatient Medications  Medication Sig Dispense Refill  . etonogestrel (NEXPLANON) 68 MG IMPL implant Inject 1 each into the skin continuous.    . ivabradine (CORLANOR) 5 MG TABS tablet Take 0.5 tablets (2.5 mg total) by mouth 2 (two) times daily with a meal. 90 tablet 3  . megestrol (MEGACE) 20 MG tablet TAKE 1 TABLET BY MOUTH ONCE A DAY. 30 tablet 0  . metoprolol tartrate (LOPRESSOR) 25 MG tablet Take 1 tablet (25 mg total) by mouth at bedtime. 90 tablet 3  . omeprazole (PRILOSEC) 20 MG capsule Take 1 capsule (20 mg total) by mouth daily. 30 capsule 1  . potassium chloride (KLOR-CON) 10 MEQ tablet TAKE 2 TABLETS DAILY. 60 tablet 0  . sertraline (ZOLOFT) 25 MG tablet Take 1 tablet (25 mg total) by mouth daily. 30 tablet 2  . sucralfate (CARAFATE) 1 g tablet Take 1 tablet (1 g total) by mouth 4 (four) times daily as needed. 30 tablet 0   No current facility-administered medications for this encounter.    Allergies  Allergen Reactions  . Fentanyl Other (See Comments)    Headaches      Social History   Socioeconomic History  . Marital status: Single    Spouse name: Not on file  . Number of children: Not on file  . Years of education: Not on file  . Highest education level: Not on file  Occupational History  . Not on file  Tobacco Use  . Smoking status: Never Smoker  . Smokeless tobacco: Never Used  Vaping Use  . Vaping Use: Never used  Substance and Sexual Activity  . Alcohol use: Not Currently  . Drug use: No  . Sexual activity: Yes    Birth control/protection: Implant  Other Topics Concern  . Not on file  Social History Narrative  . Not on file   Social Determinants of Health    Financial Resource Strain: Low Risk   . Difficulty of Paying Living Expenses: Not hard at all  Food Insecurity: No Food Insecurity  . Worried About Programme researcher, broadcasting/film/video in the Last Year: Never true  . Ran Out of Food in the Last Year: Never true  Transportation Needs: No Transportation Needs  . Lack of Transportation (Medical): No  . Lack of Transportation (Non-Medical): No  Physical Activity: Inactive  . Days of Exercise per Week: 0 days  . Minutes of Exercise per Session: 0 min  Stress: Not on file  Social Connections: Not on file  Intimate Partner Violence: Not on file      Family History  Problem Relation Age of Onset  .  Diabetes Maternal Grandmother   . Cancer Paternal Grandfather        prancreatic  . COPD Maternal Grandfather   . Cancer Maternal Grandfather        prostate  . Asthma Maternal Grandfather   . Asthma Son   . Bronchitis Son     Vitals:   10/28/20 0929  BP: 124/80  Pulse: 88  SpO2: 99%  Weight: 52.1 kg   Wt Readings from Last 3 Encounters:  10/28/20 52.1 kg  10/23/20 54.4 kg  10/22/20 54.4 kg    PHYSICAL EXAM: General:  Thin young female. No respiratory difficulty. Appears less anxious today  HEENT: normal Neck: supple. no JVD. Carotids 2+ bilat; no bruits. No lymphadenopathy or thyromegaly appreciated. Cor: PMI nondisplaced. Regular rate & rhythm. No rubs, gallops or murmurs. Lungs: clear Abdomen: soft, nontender, nondistended. No hepatosplenomegaly. No bruits or masses. Good bowel sounds. Extremities: no cyanosis, clubbing, rash, thin extremities no edema Neuro: alert & oriented x 3, cranial nerves grossly intact. moves all 4 extremities w/o difficulty. Affect pleasant.    ASSESSMENT & PLAN:  1. Chronic systolic CHF: Cardiomyopathy of uncertain etiology. No FH cardiomyopathy, negative HIV and normal TSH, no recent pregnancy. She had been drinking heavily for several months prior to diagnosis, ETOH may contribute to her cardiomyopathy.  Uncontrolled HTN likely plays a role. LHC 04/2019 w/ no CAD, filling pressures optimized and preserved cardiac output. Cardiac MRI showed no myocardial LGE, so no definitive evidence for prior MI, infiltrative disease, or myocarditis. Echo in 3/21 showed EF 30-35%, echo in 1/22 with EF up to 40-45%, normal RV.   -started back on lopressor by Dr Graciela Husbands. Will cut back to 12.5 mg at bed time due to reported low BP at home. .   (off Entresto, spiro, Coreg) - continue ivabradine   - Narrow QRS, not CRT candidate. She is out of ICD range with EF 40-45% on last echo.   2. HTN: Stable.  . No evidence for fibromuscular dysplasia on renal artery dopplers.  3. ETOH abuse: She reports she has quit ETOH. 4. 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 Remains stable on repeat EKGs - on daily KCl for hyperkalemia (BMP 3/16 K 3.8)  5. Anxiety: Marked generalized anxiety.   -  Continue sertraline 25 mg daily.  - Needs follow up with PCP.  6. Palpitations, Syncope/ Orthostatic Hypotension  - No recent events.  - ? POTS in this young female. Saw Dr Graciela Husbands and he felt this was anxiety.   - ZIo patch- no arrhythmias.  - Continue lopressor.    Follow up in 3 months. Continue HF Paramedicine.     Tonye Becket, NP 10/28/20

## 2020-11-03 ENCOUNTER — Telehealth (HOSPITAL_COMMUNITY): Payer: Self-pay

## 2020-11-03 NOTE — Telephone Encounter (Signed)
Spoke to Tina Fletcher who reports she is doing well and feeling fine with no complaints. Tina Fletcher reports being compliant with her medications and taking the metoprolol at night has gotten better. She says sometimes she doesn't take it if her blood pressure is less than 110 systolic. Tina Fletcher denied any chest pain, dizziness, shortness of breath or near syncopal episodes. Will discuss need for further paramedicine with PA and LCSW on Wednesday. Tina Fletcher aware. Call complete.

## 2020-11-16 ENCOUNTER — Other Ambulatory Visit: Payer: Self-pay | Admitting: Family Medicine

## 2020-11-16 ENCOUNTER — Other Ambulatory Visit (HOSPITAL_COMMUNITY): Payer: Self-pay | Admitting: Cardiology

## 2020-11-16 DIAGNOSIS — R636 Underweight: Secondary | ICD-10-CM

## 2020-11-16 NOTE — Telephone Encounter (Signed)
Requested medication (s) are due for refill today: yes  Requested medication (s) are on the active medication list: yes  Last refill:  10/15/20  Future visit scheduled: yes  Notes to clinic:  PCP is listed as [facility]- has future appointment at [facility] - please review.   Requested Prescriptions  Pending Prescriptions Disp Refills   megestrol (MEGACE) 20 MG tablet [Pharmacy Med Name: MEGESTROL 20 MG TABS] 30 tablet 0    Sig: TAKE 1 TABLET BY MOUTH ONCE A DAY.      OB/GYN:  Progestins Passed - 11/16/2020  9:51 AM      Passed - Valid encounter within last 12 months    Recent Outpatient Visits           2 months ago Chronic systolic CHF (congestive heart failure) (HCC)   [facility] Hoy Register, MD       Future Appointments             In 2 days Hoy Register, MD [facility]

## 2020-11-17 ENCOUNTER — Telehealth (HOSPITAL_COMMUNITY): Payer: Self-pay

## 2020-11-17 NOTE — Telephone Encounter (Signed)
Spoke to [place] who reports she is feeling well and has no complaints. Eugena reports she is taking her medications as prescribed, we went through her list together and confirmed same. Roxana was aware of her appointments. Dinita has not had any ED visits in almost one month and she reports she has been feeling good and reports she has improved a lot. Roylene agreed with plan to discharge soon but will see PCP tomorrow and I will talk with HF clinic staff as well this week to proceed. Call complete.

## 2020-11-18 ENCOUNTER — Encounter: Payer: Self-pay | Admitting: Family Medicine

## 2020-11-18 ENCOUNTER — Other Ambulatory Visit: Payer: Self-pay

## 2020-11-18 ENCOUNTER — Ambulatory Visit: Payer: Medicaid Other | Attending: Family Medicine | Admitting: Family Medicine

## 2020-11-18 VITALS — BP 102/65 | HR 84 | Ht 69.0 in | Wt 119.0 lb

## 2020-11-18 DIAGNOSIS — M545 Low back pain, unspecified: Secondary | ICD-10-CM | POA: Diagnosis not present

## 2020-11-18 DIAGNOSIS — I5022 Chronic systolic (congestive) heart failure: Secondary | ICD-10-CM

## 2020-11-18 DIAGNOSIS — Z131 Encounter for screening for diabetes mellitus: Secondary | ICD-10-CM

## 2020-11-18 DIAGNOSIS — D649 Anemia, unspecified: Secondary | ICD-10-CM | POA: Diagnosis not present

## 2020-11-18 DIAGNOSIS — R636 Underweight: Secondary | ICD-10-CM | POA: Diagnosis not present

## 2020-11-18 DIAGNOSIS — G8929 Other chronic pain: Secondary | ICD-10-CM

## 2020-11-18 LAB — POCT GLYCOSYLATED HEMOGLOBIN (HGB A1C): Hemoglobin A1C: 5.6 % (ref 4.0–5.6)

## 2020-11-18 MED ORDER — MEGESTROL ACETATE 20 MG PO TABS: 20.0000 mg | ORAL_TABLET | Freq: Every day | ORAL | 1 refills | Status: DC

## 2020-11-18 MED ORDER — LIDOCAINE 5 % EX PTCH: 1.0000 | MEDICATED_PATCH | CUTANEOUS | 0 refills | Status: DC

## 2020-11-18 MED ORDER — IRON (FERROUS SULFATE) 325 (65 FE) MG PO TABS: 325.0000 mg | ORAL_TABLET | Freq: Every day | ORAL | 3 refills | Status: DC

## 2020-11-18 NOTE — Patient Instructions (Signed)
[employer] medicine (25th ed., pp. [phone]). [place], [place]: Elsevier.">  Anemia  Anemia is a condition in which there is not enough red blood cells or hemoglobin in the blood. Hemoglobin is a substance in red blood cells that carries oxygen. When you do not have enough red blood cells or hemoglobin (are anemic), your body cannot get enough oxygen and your organs may not work properly. As a result, you may feel very tired or have other problems. What are the causes? Common causes of anemia include:  Excessive bleeding. Anemia can be caused by excessive bleeding inside or outside the body, including bleeding from the intestines or from heavy menstrual periods in females.  Poor nutrition.  Long-lasting (chronic) kidney, thyroid, and liver disease.  Bone marrow disorders, spleen problems, and blood disorders.  Cancer and treatments for cancer.  HIV (human immunodeficiency virus) and AIDS (acquired immunodeficiency syndrome).  Infections, medicines, and autoimmune disorders that destroy red blood cells. What are the signs or symptoms? Symptoms of this condition include:  Minor weakness.  Dizziness.  Headache, or difficulties concentrating and sleeping.  Heartbeats that feel irregular or faster than normal (palpitations).  Shortness of breath, especially with exercise.  Pale skin, lips, and nails, or cold hands and feet.  Indigestion and nausea. Symptoms may occur suddenly or develop slowly. If your anemia is mild, you may not have symptoms. How is this diagnosed? This condition is diagnosed based on blood tests, your medical history, and a physical exam. In some cases, a test may be needed in which cells are removed from the soft tissue inside of a bone and looked at under a microscope (bone marrow biopsy). Your health care provider may also check your stool (feces) for blood and may do additional testing to look for the cause of your bleeding. Other tests may  include:  Imaging tests, such as a CT scan or MRI.  A procedure to see inside your esophagus and stomach (endoscopy).  A procedure to see inside your colon and rectum (colonoscopy). How is this treated? Treatment for this condition depends on the cause. If you continue to lose a lot of blood, you may need to be treated at a hospital. Treatment may include:  Taking supplements of iron, vitamin Q68, or folic acid.  Taking a hormone medicine (erythropoietin) that can help to stimulate red blood cell growth.  Having a blood transfusion. This may be needed if you lose a lot of blood.  Making changes to your diet.  Having surgery to remove your spleen. Follow these instructions at home:  Take over-the-counter and prescription medicines only as told by your health care provider.  Take supplements only as told by your health care provider.  Follow any diet instructions that you were given by your health care provider.  Keep all follow-up visits as told by your health care provider. This is important. Contact a health care provider if:  You develop new bleeding anywhere in the body. Get help right away if:  You are very weak.  You are short of breath.  You have pain in your abdomen or chest.  You are dizzy or feel faint.  You have trouble concentrating.  You have bloody stools, black stools, or tarry stools.  You vomit repeatedly or you vomit up blood. These symptoms may represent a serious problem that is an emergency. Do not wait to see if the symptoms will go away. Get medical help right away. Call your local emergency services (911 in the [place] Do not  drive yourself to the hospital. Summary  Anemia is a condition in which you do not have enough red blood cells or enough of a substance in your red blood cells that carries oxygen (hemoglobin).  Symptoms may occur suddenly or develop slowly.  If your anemia is mild, you may not have symptoms.  This condition is  diagnosed with blood tests, a medical history, and a physical exam. Other tests may be needed.  Treatment for this condition depends on the cause of the anemia. This information is not intended to replace advice given to you by your health care provider. Make sure you discuss any questions you have with your health care provider. Document Revised: 05/22/2019 Document Reviewed: 05/22/2019 Elsevier Patient Education  2021 Elsevier Inc.  

## 2020-11-18 NOTE — Progress Notes (Signed)
Subjective:  Patient ID: Tina Fletcher, female    DOB: Nov 07, 1985  Age: 35 y.o. MRN: [id number]  CC: Congestive Heart Failure   HPI Tina Fletcher is a 35 year old female with a history of HFrEF (EF 40 to 45% from echo 06/2020, LVH, global hypokinesis), previous alcohol abuse who presents today for follow-up visit.  Interval History: Her back has been hurting worse when she drives in her lower back x 1 month rated as 8/10. Tylenol does not help.  Pain does not radiate down her lower extremities. Her appetite has improved since she was commenced on megestrol at her last office visit 3 months ago and she states she has gained about 7 pounds. Malignancy work-up by means of Pap smear was negative from outside facility and her thyroid labs are normal.  She would like to gain additional weight.  Past Medical History:  Diagnosis Date  . Abnormal menstrual periods 02/12/2014  . CHF (congestive heart failure) (HCC)   . Herpes simplex without mention of complication   . Hypertension   . Medical history non-contributory   . Migraines   . Nexplanon insertion 08/13/2013   08/13/13 inserted left arm, remove 08/13/16  . Vitamin D deficiency 04/01/2016    Past Surgical History:  Procedure Laterality Date  . RIGHT/LEFT HEART CATH AND CORONARY ANGIOGRAPHY N/A 05/14/2019   Procedure: RIGHT/LEFT HEART CATH AND CORONARY ANGIOGRAPHY;  Surgeon: Laurey Morale, MD;  Location: [facility] INVASIVE CV LAB;  Service: Cardiovascular;  Laterality: N/A;    Family History  Problem Relation Age of Onset  . Diabetes Maternal Grandmother   . Cancer Paternal Grandfather        prancreatic  . COPD Maternal Grandfather   . Cancer Maternal Grandfather        prostate  . Asthma Maternal Grandfather   . Asthma Son   . Bronchitis Son     Allergies  Allergen Reactions  . Fentanyl Other (See Comments)    Headaches    Outpatient Medications Prior to Visit  Medication Sig Dispense Refill  . etonogestrel  (NEXPLANON) 68 MG IMPL implant Inject 1 each into the skin continuous.    . ivabradine (CORLANOR) 5 MG TABS tablet Take 0.5 tablets (2.5 mg total) by mouth 2 (two) times daily with a meal. 90 tablet 3  . metoprolol tartrate (LOPRESSOR) 25 MG tablet Take 0.5 tablets (12.5 mg total) by mouth at bedtime. 90 tablet 3  . omeprazole (PRILOSEC) 20 MG capsule Take 1 capsule (20 mg total) by mouth daily. 30 capsule 1  . potassium chloride (KLOR-CON) 10 MEQ tablet TAKE 2 TABLETS DAILY. 60 tablet 0  . sertraline (ZOLOFT) 25 MG tablet TAKE ONE TABLET BY MOUTH ONCE DAILY. 30 tablet 0  . megestrol (MEGACE) 20 MG tablet TAKE 1 TABLET BY MOUTH ONCE A DAY. 30 tablet 0  . sucralfate (CARAFATE) 1 g tablet Take 1 tablet (1 g total) by mouth 4 (four) times daily as needed. (Patient not taking: No sig reported) 30 tablet 0   No facility-administered medications prior to visit.     ROS Review of Systems  Constitutional: Positive for unexpected weight change. Negative for activity change, appetite change and fatigue.  HENT: Negative for congestion, sinus pressure and sore throat.   Eyes: Negative for visual disturbance.  Respiratory: Negative for cough, chest tightness, shortness of breath and wheezing.   Cardiovascular: Negative for chest pain and palpitations.  Gastrointestinal: Negative for abdominal distention, abdominal pain and constipation.  Endocrine: Negative for polydipsia.  Genitourinary: Negative for dysuria and frequency.  Musculoskeletal: Positive for back pain. Negative for arthralgias.  Skin: Negative for rash.  Neurological: Negative for tremors, light-headedness and numbness.  Hematological: Does not bruise/bleed easily.  Psychiatric/Behavioral: Negative for agitation and behavioral problems.    Objective:  BP 102/65   Pulse 84   Ht 5\' 9"  (1.753 m)   Wt 119 lb (54 kg)   SpO2 98%   BMI 17.57 kg/m   BP/Weight 11/18/2020 10/28/2020 10/24/2020  Systolic BP 102 124 117  Diastolic BP 65 80  88  Wt. (Lbs) 119 114.8 -  BMI 17.57 16.95 -      Physical Exam Constitutional:      Appearance: She is well-developed.  Neck:     Vascular: No JVD.  Cardiovascular:     Rate and Rhythm: Normal rate.     Heart sounds: Normal heart sounds. No murmur heard.   Pulmonary:     Effort: Pulmonary effort is normal.     Breath sounds: Normal breath sounds. No wheezing or rales.  Chest:     Chest wall: No tenderness.  Abdominal:     General: Bowel sounds are normal. There is no distension.     Palpations: Abdomen is soft. There is no mass.     Tenderness: There is no abdominal tenderness.  Musculoskeletal:        General: Normal range of motion.     Right lower leg: No edema.     Left lower leg: No edema.  Neurological:     Mental Status: She is alert and oriented to person, place, and time.  Psychiatric:        Mood and Affect: Mood normal.     CMP Latest Ref Rng & Units 10/23/2020 10/02/2020 10/01/2020  Glucose 70 - 99 mg/dL 12/01/2020) 80 656(C)  BUN 6 - 20 mg/dL 13 11 13   Creatinine 0.44 - 1.00 mg/dL 127(N 1.70  Sodium 135 - 145 mmol/L 136 135 136  Potassium 3.5 - 5.1 mmol/L 3.8 3.7 3.6  Chloride 98 - 111 mmol/L 106 103 105  CO2 22 - 32 mmol/L 23 18(L) 20(L)  Calcium 8.9 - 10.3 mg/dL 0.17) 9.6 9.5  Total Protein 6.5 - 8.1 g/dL - 7.4 -  Total Bilirubin 0.3 - 1.2 mg/dL - 1.2 -  Alkaline Phos 38 - 126 U/L - 36(L) -  AST 15 - 41 U/L - 18 -  ALT 0 - 44 U/L - 20 -    Lipid Panel     Component Value Date/Time   CHOL 180 03/19/2016 1111   TRIG 158 (H) 03/19/2016 1111   HDL 80 03/19/2016 1111   CHOLHDL 2.3 03/19/2016 1111   LDLCALC 68 03/19/2016 1111    CBC    Component Value Date/Time   WBC 4.8 10/23/2020 2341   RBC 3.55 (L) 10/23/2020 2341   HGB 10.2 (L) 10/23/2020 2341   HGB 12.7 03/19/2016 1111   HCT 31.5 (L) 10/23/2020 2341   HCT 39.2 03/19/2016 1111   PLT 233 10/23/2020 2341   PLT 222 03/19/2016 1111   MCV 88.7 10/23/2020 2341   MCV 90 03/19/2016 1111    MCH 28.7 10/23/2020 2341   MCHC 32.4 10/23/2020 2341   RDW 13.1 10/23/2020 2341   RDW 14.1 03/19/2016 1111   LYMPHSABS 1.1 10/02/2020 0905   MONOABS 0.4 10/02/2020 0905   EOSABS 0.0 10/02/2020 0905   BASOSABS 0.0 10/02/2020 0905    Lab Results  Component Value Date  HGBA1C 5.6 11/18/2020    Assessment & Plan:  1. Chronic midline low back pain without sciatica Uncontrolled Likely positional Trial of Lidoderm patch and if ineffective consider PT referral - lidocaine (LIDODERM) 5 %; Place 1 patch onto the skin daily. Remove & Discard patch within 12 hours or as directed by MD  Dispense: 30 patch; Refill: 0  2. Screening for diabetes mellitus We will screen for diabetes given Megace use A1c is normal at 5.4 - POCT glycosylated hemoglobin (Hb A1C)  3. Anemia, unspecified type Will work-up anemia to evaluate for possibility of iron deficiency anemia - Iron, Ferrous Sulfate, 325 (65 Fe) MG TABS; Take 325 mg by mouth daily.  Dispense: 60 tablet; Refill: 3 - CBC with Differential/Platelet - Iron, TIBC and Ferritin Panel  4. Underweight Unknown etiology She has gained 7 pounds in the last 3 months Short course of Megace after which we will discontinue - megestrol (MEGACE) 20 MG tablet; Take 1 tablet (20 mg total) by mouth daily.  Dispense: 30 tablet; Refill: 1  5. Chronic systolic CHF (congestive heart failure) (HCC) EF 40 to 45% from echo of 06/2020 Euvolemic Continue Corlanor, metoprolol Follow-up with cardiology    Meds ordered this encounter  Medications  . lidocaine (LIDODERM) 5 %    Sig: Place 1 patch onto the skin daily. Remove & Discard patch within 12 hours or as directed by MD    Dispense:  30 patch    Refill:  0  . Iron, Ferrous Sulfate, 325 (65 Fe) MG TABS    Sig: Take 325 mg by mouth daily.    Dispense:  60 tablet    Refill:  3  . megestrol (MEGACE) 20 MG tablet    Sig: Take 1 tablet (20 mg total) by mouth daily.    Dispense:  30 tablet    Refill:  1     Follow-up: Return in about 6 months (around 05/21/2021) for anemia, weight loss.       Hoy Register, MD, FAAFP. [facility] [place], [place] [phone]   11/18/2020, 5:36 PM

## 2020-11-19 ENCOUNTER — Encounter (HOSPITAL_COMMUNITY): Payer: Self-pay

## 2020-11-19 ENCOUNTER — Telehealth: Payer: Self-pay | Admitting: Internal Medicine

## 2020-11-19 ENCOUNTER — Other Ambulatory Visit: Payer: Self-pay

## 2020-11-19 ENCOUNTER — Emergency Department (HOSPITAL_COMMUNITY)
Admission: EM | Admit: 2020-11-19 | Discharge: 2020-11-19 | Disposition: A | Discharge status: Home / Self Care | Payer: Medicaid Other | Attending: Emergency Medicine | Admitting: Emergency Medicine

## 2020-11-19 ENCOUNTER — Telehealth: Payer: Self-pay

## 2020-11-19 DIAGNOSIS — R0602 Shortness of breath: Secondary | ICD-10-CM | POA: Diagnosis not present

## 2020-11-19 DIAGNOSIS — R079 Chest pain, unspecified: Secondary | ICD-10-CM | POA: Diagnosis not present

## 2020-11-19 DIAGNOSIS — Z5321 Procedure and treatment not carried out due to patient leaving prior to being seen by health care provider: Secondary | ICD-10-CM | POA: Diagnosis not present

## 2020-11-19 DIAGNOSIS — R1110 Vomiting, unspecified: Secondary | ICD-10-CM | POA: Diagnosis not present

## 2020-11-19 LAB — CBC WITH DIFFERENTIAL/PLATELET
Basophils Absolute: 0 10*3/uL (ref 0.0–0.2)
Basos: 1 %
EOS (ABSOLUTE): 0.1 10*3/uL (ref 0.0–0.4)
Eos: 2 %
Hematocrit: 32.8 % — ABNORMAL LOW (ref 34.0–46.6)
Hemoglobin: 10.8 g/dL — ABNORMAL LOW (ref 11.1–15.9)
Immature Grans (Abs): 0.1 10*3/uL (ref 0.0–0.1)
Immature Granulocytes: 1 %
Lymphocytes Absolute: 1.5 10*3/uL (ref 0.7–3.1)
Lymphs: 32 %
MCH: 28.4 pg (ref 26.6–33.0)
MCHC: 32.9 g/dL (ref 31.5–35.7)
MCV: 86 fL (ref 79–97)
Monocytes Absolute: 0.4 10*3/uL (ref 0.1–0.9)
Monocytes: 9 %
Neutrophils Absolute: 2.6 10*3/uL (ref 1.4–7.0)
Neutrophils: 55 %
Platelets: 260 10*3/uL (ref 150–450)
RBC: 3.8 x10E6/uL (ref 3.77–5.28)
RDW: 12.5 % (ref 11.7–15.4)
WBC: 4.7 10*3/uL (ref 3.4–10.8)

## 2020-11-19 LAB — IRON,TIBC AND FERRITIN PANEL
Ferritin: 39 ng/mL (ref 15–150)
Iron Saturation: 27 % (ref 15–55)
Iron: 79 ug/dL (ref 27–159)
Total Iron Binding Capacity: 296 ug/dL (ref 250–450)
UIBC: 217 ug/dL (ref 131–425)

## 2020-11-19 NOTE — ED Triage Notes (Signed)
Right side chest pain with shortness of breath started approximately 15 mins prior to arrival.  She has had one episode of vomiting.

## 2020-11-19 NOTE — Telephone Encounter (Signed)
   Tina Fletcher called [place] tonight for advice regarding her blood pressure and elevated heart rate.   She states that she was given a lidocaine patch yesterday for back pain at a health and wellness visit. Not long after applying the patch, she began to feel lightheaded like she might pass out. This was associated with discomfort in her chest and felt like she was slightly short-winded. BP was 150/101, HR as high as 140, felt like her heart was racing. She reports taking her medications as directed today. She came to the ED here just prior to her call, but left after initial triage, VS, ECG after being told it may be 4 hours for her to be seen. Her VS here were BP 130/96, HR 113, SpO2 100% on RA, RR 24. I reviewed her ECG which shows sinus tachycardia, biatrial enlargement, LVH, and nonspecific ST-T wave changes that were not present on recent tracings. She no longer feels lightheaded or short of breath but still has some chest discomfort.  She has a history of idiopathic NICM with mildly reduced LVEF on most recent echo, as well as hypertension, and severe anxiety for which she was started on low-dose sertraline a couple months ago. After further chart review, it appears she has numerous recent ED visits for similar complaints with negative cardiac evaluations, overall felt to be anxiety related.   I told her that given her history and the data I have available, I feel that this current episode does seem to be most likely anxiety related with probable panic disorder. However, given her ongoing chest pain, I told her that I could not exclude a life-threatening etiology such as PE and if this episode seemed different that prior or concerning enough to her she should be evaluated in the ED. She is going to another ED with a shorter wait time.   Caller verbalized understanding and was grateful for the call back.  Clerance Lav, MD 11/19/2020, 9:06 PM

## 2020-11-19 NOTE — Telephone Encounter (Signed)
PA for Lidocaine 5% patches denied.  OTC Lidocaine 4% patches available.

## 2020-11-20 ENCOUNTER — Emergency Department (HOSPITAL_COMMUNITY)
Admission: EM | Admit: 2020-11-20 | Discharge: 2020-11-20 | Disposition: A | Discharge status: Home / Self Care | Payer: Medicaid Other | Attending: Emergency Medicine | Admitting: Emergency Medicine

## 2020-11-20 ENCOUNTER — Ambulatory Visit: Payer: Self-pay | Admitting: *Deleted

## 2020-11-20 ENCOUNTER — Encounter (HOSPITAL_COMMUNITY): Payer: Self-pay | Admitting: Emergency Medicine

## 2020-11-20 ENCOUNTER — Telehealth: Payer: Medicaid Other | Admitting: Internal Medicine

## 2020-11-20 ENCOUNTER — Other Ambulatory Visit: Payer: Self-pay

## 2020-11-20 DIAGNOSIS — K219 Gastro-esophageal reflux disease without esophagitis: Secondary | ICD-10-CM | POA: Insufficient documentation

## 2020-11-20 DIAGNOSIS — I110 Hypertensive heart disease with heart failure: Secondary | ICD-10-CM | POA: Insufficient documentation

## 2020-11-20 DIAGNOSIS — G8929 Other chronic pain: Secondary | ICD-10-CM | POA: Insufficient documentation

## 2020-11-20 DIAGNOSIS — R079 Chest pain, unspecified: Secondary | ICD-10-CM

## 2020-11-20 DIAGNOSIS — F419 Anxiety disorder, unspecified: Secondary | ICD-10-CM | POA: Insufficient documentation

## 2020-11-20 DIAGNOSIS — Z79899 Other long term (current) drug therapy: Secondary | ICD-10-CM | POA: Insufficient documentation

## 2020-11-20 DIAGNOSIS — I5022 Chronic systolic (congestive) heart failure: Secondary | ICD-10-CM | POA: Insufficient documentation

## 2020-11-20 MED ORDER — ESOMEPRAZOLE MAGNESIUM 40 MG PO CPDR: 40.0000 mg | DELAYED_RELEASE_CAPSULE | Freq: Every day | ORAL | 0 refills | Status: DC

## 2020-11-20 NOTE — ED Triage Notes (Signed)
Pt c/o center chest pain thru to her back x 1 day; pt also c/o elevated BP

## 2020-11-20 NOTE — ED Provider Notes (Signed)
[facility] EMERGENCY DEPARTMENT Provider Note   CSN: [id number] Arrival date & time: 11/20/20  2150     History Chief Complaint  Patient presents with  . Chest Pain    Tina Fletcher is a 35 y.o. female.  HPI   35 y/o female - with non obstructive cardiomyopathy - this is 40-45% EF after last echo - there is essentialy normal cardiac cath 2 years ago - she has chronic CP - feels palpitations frequently and has hypertension with it - and has been seen by cards in the past - including EP - had nothing to offer her - consideration it may be psych related - not having sx at this time.  Past Medical History:  Diagnosis Date  . Abnormal menstrual periods 02/12/2014  . CHF (congestive heart failure) (HCC)   . Herpes simplex without mention of complication   . Hypertension   . Medical history non-contributory   . Migraines   . Nexplanon insertion 08/13/2013   08/13/13 inserted left arm, remove 08/13/16  . Vitamin D deficiency 04/01/2016    Patient Active Problem List   Diagnosis Date Noted  . Hypertensive crisis 08/18/2020  . Hypokalemia 08/18/2020  . Prolonged QT interval 08/18/2020  . Syncope   . Chronic systolic CHF (congestive heart failure) (HCC)   . Cervical cancer screening 10/23/2019  . Systolic CHF, acute (HCC) 05/10/2019  . Vitamin D deficiency 04/01/2016  . Nexplanon insertion 08/13/2013  . Thrombocytopenia (HCC) 04/17/2013  . 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 Underweight 01/08/2013    Past Surgical History:  Procedure Laterality Date  . RIGHT/LEFT HEART CATH AND CORONARY ANGIOGRAPHY N/A 05/14/2019   Procedure: RIGHT/LEFT HEART CATH AND CORONARY ANGIOGRAPHY;  Surgeon: Laurey Morale, MD;  Location: [facility] INVASIVE CV LAB;  Service: Cardiovascular;  Laterality: N/A;     OB History    Gravida  2   Para  2   Term  2   Preterm      AB      Living  2     SAB      IAB      Ectopic      Multiple      Live Births  2           Family History   Problem Relation Age of Onset  . Diabetes Maternal Grandmother   . Cancer Paternal Grandfather        prancreatic  . COPD Maternal Grandfather   . Cancer Maternal Grandfather        prostate  . Asthma Maternal Grandfather   . Asthma Son   . Bronchitis Son     Social History   Tobacco Use  . Smoking status: Never Smoker  . Smokeless tobacco: Never Used  Vaping Use  . Vaping Use: Never used  Substance Use Topics  . Alcohol use: Not Currently  . Drug use: No    Home Medications Prior to Admission medications   Medication Sig Start Date End Date Taking? Authorizing Provider  esomeprazole (NEXIUM) 40 MG capsule Take 1 capsule (40 mg total) by mouth daily. 11/20/20  Yes Eber Hong, MD  etonogestrel (NEXPLANON) 68 MG IMPL implant Inject 1 each into the skin continuous.    [provider]  Iron, Ferrous Sulfate, 325 (65 Fe) MG TABS Take 325 mg by mouth daily. 11/18/20   Hoy Register, MD  ivabradine (CORLANOR) 5 MG TABS tablet Take 0.5 tablets (2.5 mg total) by mouth 2 (two) times daily with  a meal. 07/01/20   Laurey Morale, MD  lidocaine (LIDODERM) 5 % Place 1 patch onto the skin daily. Remove & Discard patch within 12 hours or as directed by MD 11/18/20   Hoy Register, MD  megestrol (MEGACE) 20 MG tablet Take 1 tablet (20 mg total) by mouth daily. 11/18/20   Hoy Register, MD  metoprolol tartrate (LOPRESSOR) 25 MG tablet Take 0.5 tablets (12.5 mg total) by mouth at bedtime. 10/28/20   Clegg, Amy D, NP  omeprazole (PRILOSEC) 20 MG capsule Take 1 capsule (20 mg total) by mouth daily. 10/05/20   Sabas Sous, MD  potassium chloride (KLOR-CON) 10 MEQ tablet TAKE 2 TABLETS DAILY. 10/15/20   Laurey Morale, MD  sertraline (ZOLOFT) 25 MG tablet TAKE ONE TABLET BY MOUTH ONCE DAILY. 11/17/20   Laurey Morale, MD  sucralfate (CARAFATE) 1 g tablet Take 1 tablet (1 g total) by mouth 4 (four) times daily as needed. Patient not taking: No sig reported 10/05/20   Sabas Sous,  MD    Allergies    Fentanyl  Review of Systems   Review of Systems  All other systems reviewed and are negative.   Physical Exam Updated Vital Signs BP (!) 134/99 (BP Location: Right Arm)   Pulse 80   Temp 97.8 F (36.6 C) (Oral)   Resp 19   Ht 1.753 m (5\' 9" )   Wt 54 kg   SpO2 100%   BMI 17.57 kg/m   Physical Exam Vitals and nursing note reviewed.  Constitutional:      General: She is not in acute distress.    Appearance: She is well-developed.  HENT:     Head: Normocephalic and atraumatic.     Mouth/Throat:     Pharynx: No oropharyngeal exudate.  Eyes:     General: No scleral icterus.       Right eye: No discharge.        Left eye: No discharge.     Conjunctiva/sclera: Conjunctivae normal.     Pupils: Pupils are equal, round, and reactive to light.  Neck:     Thyroid: No thyromegaly.     Vascular: No JVD.  Cardiovascular:     Rate and Rhythm: Normal rate and regular rhythm.     Heart sounds: Normal heart sounds. No murmur heard. No friction rub. No gallop.   Pulmonary:     Effort: Pulmonary effort is normal. No respiratory distress.     Breath sounds: Normal breath sounds. No wheezing or rales.  Abdominal:     General: Bowel sounds are normal. There is no distension.     Palpations: Abdomen is soft. There is no mass.     Tenderness: There is no abdominal tenderness.  Musculoskeletal:        General: No tenderness. Normal range of motion.     Cervical back: Normal range of motion and neck supple.  Lymphadenopathy:     Cervical: No cervical adenopathy.  Skin:    General: Skin is warm and dry.     Findings: No erythema or rash.  Neurological:     Mental Status: She is alert.     Coordination: Coordination normal.  Psychiatric:        Behavior: Behavior normal.     ED Results / Procedures / Treatments   Labs (all labs ordered are listed, but only abnormal results are displayed) Labs Reviewed - No data to display  EKG None  Radiology No  results found.  Procedures Procedures   Medications Ordered in ED Medications - No data to display  ED Course  I have reviewed the triage vital signs and the nursing notes.  Pertinent labs & imaging results that were available during my care of the patient were reviewed by me and considered in my medical decision making (see chart for details).    MDM Rules/Calculators/A&P                          Normal exam Pt seen yesterday - at outside hospital - and given Hydrocodone for home, EKG without any abnormal findings.   Final Clinical Impression(s) / ED Diagnoses Final diagnoses:  Chronic chest pain  Anxiety  Gastroesophageal reflux disease without esophagitis    Rx / DC Orders ED Discharge Orders         Ordered    esomeprazole (NEXIUM) 40 MG capsule  Daily        11/20/20 2217           Eber Hong, MD 11/20/20 2223

## 2020-11-20 NOTE — Discharge Instructions (Signed)
Your EKG is normal - no signs of heart attack.  Take the Esomeprazole daily   See your behavioral health doctor - for counseling for anxiety which may be driving your symptoms.

## 2020-11-20 NOTE — ED Notes (Signed)
ED Provider at bedside. 

## 2020-11-20 NOTE — Telephone Encounter (Signed)
  Pt called in regarding a possible reaction to the lidocaine patch prescribed for her back pain.  She ended up going to the ED for chest pain and a fast heart rate yesterday.   "They told me my heart was ok".  "They did all these tests"   "The doctor told me to not use the patch but gave me hydrocodone".   "He gave me 2 extra pills of those for my back and told me to get an appt with my primary doctor to get more if I needed them".  The next available appt with Dr Alvis Lemmings is Jan 27, 2021.   Dr. Alvis Lemmings is the dr pt saw for her back pain and prescribed the lidocaine patch.  I've sent a note to [facility] to see if she can be worked in with Dr. Alvis Lemmings sooner.   Pt was agreeable to this plan.   She needs an ED follow up appt.       (Looking in her chart she went to the [facility] ED but left before being seen)   Reason for Disposition . [1] Caller has URGENT medicine question about med that PCP or specialist prescribed AND [2] triager unable to answer question    Lidocaine patch for back possibly caused an reaction  Answer Assessment - Initial Assessment Questions 1. NAME of MEDICATION: "What medicine are you calling about?"     Lidocaine patch that Dr. Alvis Lemmings gave me  Yesterday.    It made my heart rate go up and my BP too.  I had chest pain and was about to pass out.    I took the patch off.    I went to the ER.    They gave me medicine hydrocodone and 2 extra pills for my back and not to use the patch any more.    2. QUESTION: "What is your question?" (e.g., double dose of medicine, side effect)     I need a refill for the hydrocodone.     They said my heart was fine.    3. PRESCRIBING HCP: "Who prescribed it?" Reason: if prescribed by specialist, call should be referred to that group.     Dr. Alvis Lemmings 4. SYMPTOMS: "Do you have any symptoms?"     Not now   The patch is off and all.    5. SEVERITY: If symptoms are present, ask "Are they mild, moderate or  severe?"     Severe went to the ED 6. PREGNANCY:  "Is there any chance that you are pregnant?" "When was your last menstrual period?"     Not asked  Protocols used: MEDICATION QUESTION CALL-A-AH

## 2020-11-20 NOTE — ED Notes (Signed)
Pt was seen at [facility] yesterday for same; reports she was given a RX for Hydrocodone 5-325 tablets that have given her no relief

## 2020-11-21 NOTE — Telephone Encounter (Signed)
Please place patient on schedule and inform patient.

## 2020-11-26 ENCOUNTER — Encounter (HOSPITAL_COMMUNITY): Payer: Self-pay

## 2020-11-27 ENCOUNTER — Ambulatory Visit: Payer: Self-pay | Admitting: *Deleted

## 2020-11-27 ENCOUNTER — Other Ambulatory Visit (HOSPITAL_COMMUNITY): Payer: Self-pay | Admitting: *Deleted

## 2020-11-27 MED ORDER — IVABRADINE HCL 5 MG PO TABS: 2.5000 mg | ORAL_TABLET | Freq: Two times a day (BID) | ORAL | 3 refills | Status: DC

## 2020-11-27 NOTE — Telephone Encounter (Signed)
Patient calling to ask if taking Aspercreme OTC medication can cause interaction with heart medications. Patient reports she was prescribed lidocaine patch from ED visit and she got OTC Aspercreme to see if it would do the same. Patient wanted to know if it could cause chest pain while taking her heart medication. Patient is not having chest pain now and instructed patient if she has chest pain go back to ED. Instructed patient to contact pharmacist and her PCP for more information of medication interactions. Instructed patient if she uses any patches or creams for pain to clean area on skin after removing prior to replacing a patch or applying more cream. Care advise given. Patient verbalized understanding of care advise and to call PCP or go to UC or ED if symptoms worsen.  Reason for Disposition . [1] Caller has medicine question about med NOT prescribed by PCP AND [2] triager unable to answer question (e.g., compatibility with other med, storage)  Answer Assessment - Initial Assessment Questions 1. NAME of MEDICATION: "What medicine are you calling about?"     Lidocaine patch and aspercreme  2. QUESTION: "What is your question?" (e.g., double dose of medicine, side effect)     Can aspercreme medication interact with heart medication 3. PRESCRIBING HCP: "Who prescribed it?" Reason: if prescribed by specialist, call should be referred to that group.     ED  4. SYMPTOMS: "Do you have any symptoms?"     Chest pain 5. SEVERITY: If symptoms are present, ask "Are they mild, moderate or severe?"     No  6. PREGNANCY:  "Is there any chance that you are pregnant?" "When was your last menstrual period?"     na  Protocols used: MEDICATION QUESTION CALL-A-AH

## 2020-12-02 ENCOUNTER — Encounter (HOSPITAL_COMMUNITY): Payer: Self-pay

## 2020-12-02 ENCOUNTER — Emergency Department (HOSPITAL_COMMUNITY)
Admission: EM | Admit: 2020-12-02 | Discharge: 2020-12-02 | Disposition: A | Discharge status: Home / Self Care | Payer: Medicaid Other | Attending: Emergency Medicine | Admitting: Emergency Medicine

## 2020-12-02 DIAGNOSIS — I5022 Chronic systolic (congestive) heart failure: Secondary | ICD-10-CM | POA: Diagnosis not present

## 2020-12-02 DIAGNOSIS — R0602 Shortness of breath: Secondary | ICD-10-CM | POA: Insufficient documentation

## 2020-12-02 DIAGNOSIS — R072 Precordial pain: Secondary | ICD-10-CM | POA: Insufficient documentation

## 2020-12-02 DIAGNOSIS — Z79899 Other long term (current) drug therapy: Secondary | ICD-10-CM | POA: Insufficient documentation

## 2020-12-02 DIAGNOSIS — I110 Hypertensive heart disease with heart failure: Secondary | ICD-10-CM | POA: Diagnosis not present

## 2020-12-02 DIAGNOSIS — Z9861 Coronary angioplasty status: Secondary | ICD-10-CM | POA: Insufficient documentation

## 2020-12-02 NOTE — ED Provider Notes (Signed)
[facility] EMERGENCY DEPARTMENT Provider Note   CSN: [id number] Arrival date & time: 12/02/20  0231     History Chief Complaint  Patient presents with  . Chest Pain    Tina Fletcher is a 35 y.o. female.  The history is provided by the patient.  Chest Pain Pain location:  Substernal area Pain quality: pressure   Pain radiates to:  Does not radiate Pain severity:  Moderate Onset quality:  Sudden Progression:  Resolved Chronicity:  Chronic Relieved by:  None tried Worsened by:  Nothing Associated symptoms: shortness of breath   Associated symptoms: no fever, no syncope and no vomiting   Patient with a history of chronic chest pain, nonischemic cardiomyopathy, hypertension presents with chest pain.  Patient reports this started over 12 hours ago.  It is now resolving.  She reports shortness of breath.  No syncope.  This is similar to prior episodes.  She reports med compliant     Past Medical History:  Diagnosis Date  . Abnormal menstrual periods 02/12/2014  . CHF (congestive heart failure) (HCC)   . Herpes simplex without mention of complication   . Hypertension   . Medical history non-contributory   . Migraines   . Nexplanon insertion 08/13/2013   08/13/13 inserted left arm, remove 08/13/16  . Vitamin D deficiency 04/01/2016    Patient Active Problem List   Diagnosis Date Noted  . Hypertensive crisis 08/18/2020  . Hypokalemia 08/18/2020  . Prolonged QT interval 08/18/2020  . Syncope   . Chronic systolic CHF (congestive heart failure) (HCC)   . Cervical cancer screening 10/23/2019  . Systolic CHF, acute (HCC) 05/10/2019  . Vitamin D deficiency 04/01/2016  . Nexplanon insertion 08/13/2013  . Thrombocytopenia (HCC) 04/17/2013  . 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 Underweight 01/08/2013    Past Surgical History:  Procedure Laterality Date  . RIGHT/LEFT HEART CATH AND CORONARY ANGIOGRAPHY N/A 05/14/2019   Procedure: RIGHT/LEFT HEART CATH AND CORONARY  ANGIOGRAPHY;  Surgeon: Laurey Morale, MD;  Location: [facility] INVASIVE CV LAB;  Service: Cardiovascular;  Laterality: N/A;     OB History    Gravida  2   Para  2   Term  2   Preterm      AB      Living  2     SAB      IAB      Ectopic      Multiple      Live Births  2           Family History  Problem Relation Age of Onset  . Diabetes Maternal Grandmother   . Cancer Paternal Grandfather        prancreatic  . COPD Maternal Grandfather   . Cancer Maternal Grandfather        prostate  . Asthma Maternal Grandfather   . Asthma Son   . Bronchitis Son     Social History   Tobacco Use  . Smoking status: Never Smoker  . Smokeless tobacco: Never Used  Vaping Use  . Vaping Use: Never used  Substance Use Topics  . Alcohol use: Not Currently  . Drug use: No    Home Medications Prior to Admission medications   Medication Sig Start Date End Date Taking? Authorizing Provider  esomeprazole (NEXIUM) 40 MG capsule Take 1 capsule (40 mg total) by mouth daily. 11/20/20   Eber Hong, MD  etonogestrel (NEXPLANON) 68 MG IMPL implant Inject 1 each into the skin continuous.  [provider]  Iron, Ferrous Sulfate, 325 (65 Fe) MG TABS Take 325 mg by mouth daily. 11/18/20   Hoy Register, MD  ivabradine (CORLANOR) 5 MG TABS tablet Take 0.5 tablets (2.5 mg total) by mouth 2 (two) times daily with a meal. 11/27/20   Laurey Morale, MD  lidocaine (LIDODERM) 5 % Place 1 patch onto the skin daily. Remove & Discard patch within 12 hours or as directed by MD 11/18/20   Hoy Register, MD  megestrol (MEGACE) 20 MG tablet Take 1 tablet (20 mg total) by mouth daily. 11/18/20   Hoy Register, MD  metoprolol tartrate (LOPRESSOR) 25 MG tablet Take 0.5 tablets (12.5 mg total) by mouth at bedtime. 10/28/20   Clegg, Amy D, NP  omeprazole (PRILOSEC) 20 MG capsule Take 1 capsule (20 mg total) by mouth daily. 10/05/20   Sabas Sous, MD  potassium chloride (KLOR-CON) 10 MEQ tablet  TAKE 2 TABLETS DAILY. 10/15/20   Laurey Morale, MD  sertraline (ZOLOFT) 25 MG tablet TAKE ONE TABLET BY MOUTH ONCE DAILY. 11/17/20   Laurey Morale, MD  sucralfate (CARAFATE) 1 g tablet Take 1 tablet (1 g total) by mouth 4 (four) times daily as needed. Patient not taking: No sig reported 10/05/20   Sabas Sous, MD    Allergies    Fentanyl  Review of Systems   Review of Systems  Constitutional: Negative for fever.  Respiratory: Positive for shortness of breath.   Cardiovascular: Positive for chest pain. Negative for syncope.  Gastrointestinal: Negative for vomiting.  Neurological: Negative for syncope.    Physical Exam Updated Vital Signs BP 123/86   Pulse 75   Temp 98.6 F (37 C)   Resp 19   Ht 1.753 m (5\' 9" )   Wt 54.4 kg   SpO2 99%   BMI 17.72 kg/m   Physical Exam CONSTITUTIONAL: Sleeping but easily arousable, no acute distress HEAD: Normocephalic/atraumatic EYES: EOMI NECK: supple no meningeal signs CV: S1/S2 noted, no murmurs/rubs/gallops noted LUNGS: Lungs are clear to auscultation bilaterally, no apparent distress ABDOMEN: soft NEURO: Pt is awake/alert/appropriate, moves all extremitiesx4.  No facial droop.   EXTREMITIES:  full ROM SKIN: warm, color normal PSYCH: no abnormalities of mood noted, alert and oriented to situation  ED Results / Procedures / Treatments   Labs (all labs ordered are listed, but only abnormal results are displayed) Labs Reviewed - No data to display  EKG EKG Interpretation  Date/Time:  Tuesday December 02 2020 02:47:17 EDT Ventricular Rate:  75 PR Interval:  164 QRS Duration: 98 QT Interval:  417 QTC Calculation: 466 R Axis:   72 Text Interpretation: Sinus rhythm No significant change since last tracing Confirmed by 12-05-2005 (Zadie Rhine) on 12/02/2020 2:54:24 AM   Radiology No results found.  Procedures Procedures   Medications Ordered in ED Medications - No data to display  ED Course  I have reviewed the triage  vital signs and the nursing notes.    MDM Rules/Calculators/A&P                          Patient presents for 33rd ER visit in 6 months.  Patient appears to have chronic chest pain.  She is well-known to cardiology.  Previous cardiac cath in 2020 did not reveal any obstructive disease.  Low suspicion for ACS/PE/dissection at this time Final Clinical Impression(s) / ED Diagnoses Final diagnoses:  Precordial pain    Rx / DC Orders ED  Discharge Orders    None       Zadie Rhine, MD 12/02/20 [phone]

## 2020-12-02 NOTE — ED Triage Notes (Signed)
Complaints of chest pain that began at 3:00 pm.

## 2020-12-05 ENCOUNTER — Telehealth (HOSPITAL_COMMUNITY): Payer: Self-pay

## 2020-12-05 NOTE — Telephone Encounter (Signed)
Discharge from paramedicine program complete for Tina Fletcher.   Tina Fletcher agreed with plan and understands to reach out to clinic if she is to require any assistance.   Call complete.   Patient is now discharged from [facility].  Patient has/has not met the following goals:  Yes :Patient expresses basic understanding of medications and what they are for Yes :Patient able to verbalize heart failure specific dietary/fluid restrictions Yes :Patient is aware of who to call if they have medical concerns or if they need to schedule or change appts Yes :Patient has a scale for daily weights and weighs regularly Yes :Patient able to verbalize concerning symptoms when they should call the HF clinic (weight gain ranges, etc) Yes :Patient has a PCP and has seen within the past year or has upcoming appt Yes :Patient has reliable access to getting their medications Yes :Patient has shown they are able to reorder medications reliably Yes :Patient has had admission in past 30 days- if yes how many? No :Patient has had admission in past 90 days- if yes how many?  Discharge Comments:

## 2020-12-15 ENCOUNTER — Emergency Department (HOSPITAL_COMMUNITY)
Admission: EM | Admit: 2020-12-15 | Discharge: 2020-12-15 | Disposition: A | Discharge status: Home / Self Care | Payer: Medicaid Other | Attending: Emergency Medicine | Admitting: Emergency Medicine

## 2020-12-15 ENCOUNTER — Emergency Department (HOSPITAL_COMMUNITY): Payer: Medicaid Other

## 2020-12-15 ENCOUNTER — Encounter (HOSPITAL_COMMUNITY): Payer: Self-pay | Admitting: *Deleted

## 2020-12-15 DIAGNOSIS — I110 Hypertensive heart disease with heart failure: Secondary | ICD-10-CM | POA: Diagnosis not present

## 2020-12-15 DIAGNOSIS — R079 Chest pain, unspecified: Secondary | ICD-10-CM | POA: Diagnosis not present

## 2020-12-15 DIAGNOSIS — R42 Dizziness and giddiness: Secondary | ICD-10-CM | POA: Diagnosis not present

## 2020-12-15 DIAGNOSIS — Z79899 Other long term (current) drug therapy: Secondary | ICD-10-CM | POA: Diagnosis not present

## 2020-12-15 DIAGNOSIS — R112 Nausea with vomiting, unspecified: Secondary | ICD-10-CM | POA: Diagnosis not present

## 2020-12-15 DIAGNOSIS — I5023 Acute on chronic systolic (congestive) heart failure: Secondary | ICD-10-CM | POA: Diagnosis not present

## 2020-12-15 LAB — BASIC METABOLIC PANEL
Anion gap: 8 (ref 5–15)
BUN: 11 mg/dL (ref 6–20)
CO2: 24 mmol/L (ref 22–32)
Calcium: 9.2 mg/dL (ref 8.9–10.3)
Chloride: 105 mmol/L (ref 98–111)
Creatinine, Ser: 0.85 mg/dL (ref 0.44–1.00)
GFR, Estimated: >60 mL/min (ref >60)
Glucose, Bld: 89 mg/dL (ref 70–99)
Potassium: 3.4 mmol/L — ABNORMAL LOW (ref 3.5–5.1)
Sodium: 137 mmol/L (ref 135–145)

## 2020-12-15 LAB — CBC
HCT: 34.8 % — ABNORMAL LOW (ref 36.0–46.0)
Hemoglobin: 11.2 g/dL — ABNORMAL LOW (ref 12.0–15.0)
MCH: 28.7 pg (ref 26.0–34.0)
MCHC: 32.2 g/dL (ref 30.0–36.0)
MCV: 89.2 fL (ref 80.0–100.0)
Platelets: 218 10*3/uL (ref 150–400)
RBC: 3.9 MIL/uL (ref 3.87–5.11)
RDW: 12.9 % (ref 11.5–15.5)
WBC: 4.7 10*3/uL (ref 4.0–10.5)
nRBC: 0 % (ref 0.0–0.2)

## 2020-12-15 LAB — TROPONIN I (HIGH SENSITIVITY)
Troponin I (High Sensitivity): <2 ng/L (ref <18)
Troponin I (High Sensitivity): <2 ng/L (ref <18)

## 2020-12-15 MED ORDER — POTASSIUM CHLORIDE CRYS ER 20 MEQ PO TBCR
40.0000 meq | EXTENDED_RELEASE_TABLET | Freq: Once | ORAL | Status: AC
  Administered 2020-12-15: 40 meq via ORAL
  Filled 2020-12-15: qty 2

## 2020-12-15 NOTE — ED Provider Notes (Signed)
Emergency Medicine Provider Triage Evaluation Note  Tina Fletcher , a 35 y.o. female  was evaluated in triage.  Pt complains of presents with chest pain and a headache x2 days.  Patient states she has had chest pain in the past feels similar.  She denies shortness of breath or pedal edema..  Review of Systems  Positive: Chest pain headaches Negative: Denies shortness of breath or pedal edema  Physical Exam  BP (!) 142/97 (BP Location: Left Arm)   Pulse (!) 108   Temp 98.1 F (36.7 C)   Resp 18   Ht 5\' 9"  (1.753 m)   Wt 56.7 kg   SpO2 100%   BMI 18.46 kg/m  Gen:   Awake, no distress   Resp:  Normal effort  MSK:   Moves extremities without difficulty  Other:    Medical Decision Making  Medically screening exam initiated at 7:19 PM.  Appropriate orders placed.  [name] was informed that the remainder of the evaluation will be completed by another provider, this initial triage assessment does not replace that evaluation, and the importance of remaining in the ED until their evaluation is complete.  Patient presents with chest pain, labwork imaging have been ordered, patient will need further work-up here in the emergency department.   , PA-C 12/15/20 12/17/20, MD 12/18/20 [phone]

## 2020-12-15 NOTE — ED Triage Notes (Signed)
Chest pain onset yesterday, history of recurrent chest pain

## 2020-12-15 NOTE — ED Provider Notes (Signed)
[facility] EMERGENCY DEPARTMENT Provider Note   CSN: [id number] Arrival date & time: 12/15/20  1801     History Chief Complaint  Patient presents with   Chest Pain    Tina Fletcher is a 35 y.o. female.  HPI 35 year old female presents with chest pain.  She has been having burning in her chest since yesterday.  This is a recurrent issue for her and feels like many prior episodes.  However she is also been dealing with right-sided back pain for months and took a friend's oxycodone.  This caused her to get dizzy and then nauseous and vomiting and then she noticed her heart rate going up.  Heart rate has come back down.  Chest burning has remained stable.  The back pain has been chronic for months and does not involve any urinary symptoms, weakness or numbness in the legs, or incontinence.  However Tylenol has not been helping.  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 complication    Hypertension    Medical history non-contributory    Migraines    Nexplanon insertion 08/13/2013   08/13/13 inserted left arm, remove 08/13/16   Vitamin D deficiency 04/01/2016    Patient Active Problem List   Diagnosis Date Noted   Hypertensive crisis 08/18/2020   Hypokalemia 08/18/2020   Prolonged QT interval 08/18/2020   Syncope    Chronic systolic CHF (congestive heart failure) (HCC)    Cervical cancer screening 10/23/2019   Systolic CHF, acute (HCC) 05/10/2019   Vitamin D deficiency 04/01/2016   Nexplanon insertion 08/13/2013   Thrombocytopenia (HCC) 04/17/2013   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   Underweight 01/08/2013    Past Surgical History:  Procedure Laterality Date   RIGHT/LEFT HEART CATH AND CORONARY ANGIOGRAPHY N/A 05/14/2019   Procedure: RIGHT/LEFT HEART CATH AND CORONARY ANGIOGRAPHY;  Surgeon: Laurey Morale, MD;  Location: [facility] INVASIVE CV LAB;  Service:  Cardiovascular;  Laterality: N/A;     OB History     Gravida  2   Para  2   Term  2   Preterm      AB      Living  2      SAB      IAB      Ectopic      Multiple      Live Births  2           Family History  Problem Relation Age of Onset   Diabetes Maternal Grandmother    Cancer Paternal Grandfather        prancreatic   COPD Maternal Grandfather    Cancer Maternal Grandfather        prostate   Asthma Maternal Grandfather    Asthma Son    Bronchitis Son     Social History   Tobacco Use   Smoking status: Never   Smokeless tobacco: Never  Vaping Use   Vaping Use: Never used  Substance Use Topics   Alcohol use: Not Currently   Drug use: No    Home Medications Prior to Admission medications   Medication Sig Start Date End Date Taking? Authorizing Provider  esomeprazole (NEXIUM) 40 MG capsule Take 1 capsule (40 mg total) by mouth daily. 11/20/20   Eber Hong, MD  etonogestrel (NEXPLANON) 68 MG IMPL implant Inject 1 each into the skin continuous.    [provider]  Iron,  Ferrous Sulfate, 325 (65 Fe) MG TABS Take 325 mg by mouth daily. 11/18/20   Hoy Register, MD  ivabradine (CORLANOR) 5 MG TABS tablet Take 0.5 tablets (2.5 mg total) by mouth 2 (two) times daily with a meal. 11/27/20   Laurey Morale, MD  lidocaine (LIDODERM) 5 % Place 1 patch onto the skin daily. Remove & Discard patch within 12 hours or as directed by MD 11/18/20   Hoy Register, MD  megestrol (MEGACE) 20 MG tablet Take 1 tablet (20 mg total) by mouth daily. 11/18/20   Hoy Register, MD  metoprolol tartrate (LOPRESSOR) 25 MG tablet Take 0.5 tablets (12.5 mg total) by mouth at bedtime. 10/28/20   Clegg, Amy D, NP  omeprazole (PRILOSEC) 20 MG capsule Take 1 capsule (20 mg total) by mouth daily. 10/05/20   Sabas Sous, MD  potassium chloride (KLOR-CON) 10 MEQ tablet TAKE 2 TABLETS DAILY. 10/15/20   Laurey Morale, MD  sertraline (ZOLOFT) 25 MG tablet TAKE ONE TABLET BY  MOUTH ONCE DAILY. 11/17/20   Laurey Morale, MD  sucralfate (CARAFATE) 1 g tablet Take 1 tablet (1 g total) by mouth 4 (four) times daily as needed. Patient not taking: No sig reported 10/05/20   Sabas Sous, MD    Allergies    Fentanyl  Review of Systems   Review of Systems  Constitutional:  Negative for fever.  Respiratory:  Negative for cough and shortness of breath.   Cardiovascular:  Positive for chest pain.  Gastrointestinal:  Positive for vomiting. Negative for abdominal pain.  Musculoskeletal:  Positive for back pain.  Neurological:  Positive for dizziness.  All other systems reviewed and are negative.  Physical Exam Updated Vital Signs BP 123/90   Pulse 71   Temp 98.1 F (36.7 C)   Resp (!) 71   Ht 5\' 9"  (1.753 m)   Wt 56.7 kg   SpO2 100%   BMI 18.46 kg/m   Physical Exam Vitals and nursing note reviewed.  Constitutional:      Appearance: She is well-developed.  HENT:     Head: Normocephalic and atraumatic.     Right Ear: External ear normal.     Left Ear: External ear normal.     Nose: Nose normal.  Eyes:     General:        Right eye: No discharge.        Left eye: No discharge.  Cardiovascular:     Rate and Rhythm: Normal rate and regular rhythm.     Heart sounds: Normal heart sounds.  Pulmonary:     Effort: Pulmonary effort is normal.     Breath sounds: Normal breath sounds.  Abdominal:     Palpations: Abdomen is soft.     Tenderness: There is no abdominal tenderness.  Musculoskeletal:     Comments: No significant back tenderness  Skin:    General: Skin is warm and dry.  Neurological:     Mental Status: She is alert.     Comments: 5/5 strength in BLE. Grossly normal sensation  Psychiatric:        Mood and Affect: Mood is not anxious.    ED Results / Procedures / Treatments   Labs (all labs ordered are listed, but only abnormal results are displayed) Labs Reviewed  BASIC METABOLIC PANEL - Abnormal; Notable for the following  components:      Result Value   Potassium 3.4 (*)    All other components within normal limits  CBC - Abnormal; Notable for the following components:   Hemoglobin 11.2 (*)    HCT 34.8 (*)    All other components within normal limits  POC URINE PREG, ED  TROPONIN I (HIGH SENSITIVITY)  TROPONIN I (HIGH SENSITIVITY)    EKG EKG Interpretation  Date/Time:  Monday December 15 2020 18:12:57 EDT Ventricular Rate:  111 PR Interval:  172 QRS Duration: 74 QT Interval:  290 QTC Calculation: 394 R Axis:   62 Text Interpretation: Sinus tachycardia Biatrial enlargement ST & T wave abnormality, consider inferior ischemia ST & T wave abnormality, consider anterolateral ischemia rate is faster compared to June 2022 Confirmed by Pricilla Loveless [phone]) on 12/15/2020 6:22:33 PM  Radiology DG Chest 2 View  Result Date: 12/15/2020 CLINICAL DATA:  Chest pain, onset yesterday. EXAM: CHEST - 2 VIEW COMPARISON:  11/19/2020, CT 09/27/2020 FINDINGS: The cardiomediastinal contours are normal. The lungs are clear. Pulmonary vasculature is normal. No consolidation, pleural effusion, or pneumothorax. No acute osseous abnormalities are seen. IMPRESSION: Negative radiographs of the chest.  No change from prior exams. Electronically Signed   By: Narda Rutherford M.D.   On: 12/15/2020 18:44    Procedures Procedures   Medications Ordered in ED Medications  potassium chloride SA (KLOR-CON) CR tablet 40 mEq (has no administration in time range)    ED Course  I have reviewed the triage vital signs and the nursing notes.  Pertinent labs & imaging results that were available during my care of the patient were reviewed by me and considered in my medical decision making (see chart for details).    MDM Rules/Calculators/A&P                          While the patient was tachycardic on arrival this seems to be more likely due to the vomiting and side effect from oxycodone than it does pathology.  She has presented for  many similar chest pain complaints.  I do not think a PE work-up is needed.  My suspicion for ACS, PE, dissection is pretty low.  We will replete potassium but otherwise she appears stable for discharge home to follow-up with PCP/cardiology. Final Clinical Impression(s) / ED Diagnoses Final diagnoses:  Nonspecific chest pain    Rx / DC Orders ED Discharge Orders     None        Pricilla Loveless, MD 12/15/20 2239

## 2020-12-15 NOTE — Discharge Instructions (Addendum)
If you develop recurrent, continued, or worsening chest pain, shortness of breath, fever, vomiting, abdominal or back pain, or any other new/concerning symptoms then return to the ER for evaluation.  

## 2020-12-17 ENCOUNTER — Other Ambulatory Visit: Payer: Self-pay | Admitting: Family Medicine

## 2020-12-17 DIAGNOSIS — R636 Underweight: Secondary | ICD-10-CM

## 2020-12-17 NOTE — Telephone Encounter (Signed)
  Notes to clinic: Note states that this is short term and will be d/c    Requested Prescriptions  Pending Prescriptions Disp Refills   megestrol (MEGACE) 20 MG tablet [Pharmacy Med Name: MEGESTROL 20MG  TABLET] 30 tablet 0    Sig: TAKE 1 TABLET BY MOUTH ONCE A DAY.      OB/GYN:  Progestins Passed - 12/17/2020 11:03 AM      Passed - Valid encounter within last 12 months    Recent Outpatient Visits           4 weeks ago Screening for diabetes mellitus   [facility] [place], [place], MD   3 months ago Chronic systolic CHF (congestive heart failure) [facility])   [facility], MD

## 2020-12-22 NOTE — Progress Notes (Signed)
Advanced Heart Failure Clinic Note   PCP: [facility] PCP-Cardiologist: Dr. Shirlee Latch    HPI: 35 y.o. female with past history of HTN presented to [facility] on 05/10/19 with cough, SOB, LE swelling, orthopnea, bendopnea and 20lb weight gain over 3 months. She had a history of increased alcohol use over the several months prior with 7-8 airplane bottles of liquor 3-4 days per week. BNP was elevated and CTA showed pulmonary edema. Echo showed new onset systolic HF with EF 15% and decreased RV systolic function. Cardiology was consulted and she was transferred to [facility] [place].   While admitted she was diuresed with IV lasix. Right and Left heart cath were done. LHC showed no significant coronary artery disease and RHC was within normal limits. Her cardiomyopathy is possibly secondary to recent increased alcohol use or uncontrolled HTN. Viral myocarditis was felt to be a possible etiology with recent cough, however cMRI showed no myocardial LGE, so no definitive evidence for prior MI, infiltrative disease, or myocarditis.  Also of note, she has no family history of cardiomyopathy, negative HIV, and TSH was normal.   Echo in 3/21 showed EF 30-35%, global hypokinesis, mild RV dilation with normal function, mild MR.   On 06/06/20, she had a CBD gummy with a high dosage of CBD in it.  Shortly afterwards, she became lightheaded and dizzy and actually passed out.  She had been out of her meds for several days and her BP was elevated when she went to the ER.  HS-TnI and D dimer were normal. She was discharged home.   Echo in 1/22 showed EF up to 40-45%. She wore a Zio patch in 1/22 with no significant events but only kept it on for about 4 days.   In 2/22, she was admitted with hypertensive urgency with syncope.  BP 218/110.  She had not been taking her evening BP med doses.  BP was controlled and she was discharged.  Later in 2/22, she went to the ER with chest pain.  Troponin was negative but BP was  high.  She was sent home.   Evaluted in clinic, 08/22/20 and was hypertensive. Also very anxious. EKG showed NSR w/ mildly prolonged QTc 474 ms. Labs reviewed and showed hypokalemia. KCl 20 daily was added. Amlodipine 5 mg daily also added. Dr. Shirlee Latch also started sertraline 25 mg daily for marked anxiety.   Had return clinic f/u on 3/1. Just prior to that had been seen in the ED on 2/27 for continued anxiety and palpitations. She reported the ED physician gave her something for possible withdrawals, which had helped significantly. She had quit drinking 8 days prior.  BMP was checked on 2/27 and hypokalemia had resolved, K 3.5 (recently as low at 2.9). SCr ok at 0.79. She continued w/ frequent episodes of low BP at home and dizziness as well as fatigue. Coreg was reduced to 3.125 bid and amlodipine discontinued.   She was evaluated 11 times at different EDs for chest pain.  No cardiac findings. Felt to be due to anxiety.   Returned 10/28/20 for HF follow up. She was taking metoprolol once a day due to soft BP. Volume and symptoms stable.  Evaluated in ED 11/19/20, 12/02/20, and 12/15/20 for CP. Cardiac work up unremarkable, EKGs reassuring. Felt to be due to anxiety and possibly GERD.  Today she returns for HF follow up. Overall feeling fine. No more CP since last ED visit last week. Denies increasing SOB, dizziness, edema, or PND/Orthopnea. Appetite  poor, on Megace. No fever or chills. Weight at home 125 pounds. Taking all medications. Lives with her husband and 2 children, no longer followed by HF paramedicine. Was told by ER MD her lidocaine patch for her back pain may be interfering with her medications, causing increase in BP and CP. No ETOH since 08/16/20.  ECG (personally reviewed): SR 74 bpm, qtc 461 ms   Labs (11/20): K 3.8, creatinine 1.1 Labs (2/21): K 3.6, creatinine 0.89 Labs (12/21): BNP 145, K 3.7, creatinine 0.67, hgb 12.2 Labs (2/22): K 3.4, creatinine 0.79, hgb 11.5 Labs (2/22): K 3.5,  Creatinine 0.79 Labs (3/22): K 3.8, Creatinine 0.76 Labs (5/22): K 3.2, creatinine 1.01 Labs (6/22): K 3.4, creatinine 0.85  PMH: 1. HTN - Renal artery dopplers (3/21): No evidence for renal artery stenosis.  2. ETOH abuse: Has quit.  3. Chronic systolic CHF: Nonischemic cardiomyopathy.  - LHC/RHC (11/20): No significant CAD; mean RA 1, PA 24/8, mean PCWP 5, CI 3.54.  - Echo (11/20): EF 15%, diffuse hypokinesis, mildly decreased RV systolic function.  - CMRI (11/20): LV mildly dilated with EF 16%, normal RV size with EF 28%, no myocardial LGE.  - Echo (3/21): EF 30-35%, global hypokinesis, mild RV dilation with normal function, mild MR.  - Echo (1/22): EF 40-45%, normal RV.  4. COVID-19 infection 9/21 5. Prolonged QT interval  Review of Systems: All systems reviewed and negative except as per HPI.   Current Outpatient Medications  Medication Sig Dispense Refill   esomeprazole (NEXIUM) 40 MG capsule Take 1 capsule (40 mg total) by mouth daily. 30 capsule 0   etonogestrel (NEXPLANON) 68 MG IMPL implant Inject 1 each into the skin continuous.     Iron, Ferrous Sulfate, 325 (65 Fe) MG TABS Take 325 mg by mouth daily. 60 tablet 3   ivabradine (CORLANOR) 5 MG TABS tablet Take 0.5 tablets (2.5 mg total) by mouth 2 (two) times daily with a meal. 90 tablet 3   megestrol (MEGACE) 20 MG tablet Take 1 tablet (20 mg total) by mouth daily. 30 tablet 1   metoprolol tartrate (LOPRESSOR) 25 MG tablet Take 0.5 tablets (12.5 mg total) by mouth at bedtime. 90 tablet 3   omeprazole (PRILOSEC) 20 MG capsule Take 1 capsule (20 mg total) by mouth daily. 30 capsule 1   potassium chloride (KLOR-CON) 10 MEQ tablet TAKE 2 TABLETS DAILY. 60 tablet 0   sertraline (ZOLOFT) 25 MG tablet TAKE ONE TABLET BY MOUTH ONCE DAILY. 30 tablet 0   No current facility-administered medications for this encounter.   Allergies  Allergen Reactions   Fentanyl Other (See Comments)    Headaches   Social History    Socioeconomic History   Marital status: Single    Spouse name: Not on file   Number of children: Not on file   Years of education: Not on file   Highest education level: Not on file  Occupational History   Not on file  Tobacco Use   Smoking status: Never   Smokeless tobacco: Never  Vaping Use   Vaping Use: Never used  Substance and Sexual Activity   Alcohol use: Not Currently   Drug use: No   Sexual activity: Yes    Birth control/protection: Implant  Other Topics Concern   Not on file  Social History Narrative   Not on file   Social Determinants of Health   Financial Resource Strain: Low Risk    Difficulty of Paying Living Expenses: Not hard at all  Food Insecurity: No Food Insecurity   Worried About Programme researcher, broadcasting/film/video in the Last Year: Never true   Ran Out of Food in the Last Year: Never true  Transportation Needs: No Transportation Needs   Lack of Transportation (Medical): No   Lack of Transportation (Non-Medical): No  Physical Activity: Inactive   Days of Exercise per Week: 0 days   Minutes of Exercise per Session: 0 min  Stress: Not on file  Social Connections: Not on file  Intimate Partner Violence: Not on file   Family History  Problem Relation Age of Onset   Diabetes Maternal Grandmother    Cancer Paternal Grandfather        prancreatic   COPD Maternal Grandfather    Cancer Maternal Grandfather        prostate   Asthma Maternal Grandfather    Asthma Son    Bronchitis Son    BP 108/80   Pulse 72   Wt 54.6 kg (120 lb 6.4 oz)   SpO2 99%   BMI 17.78 kg/m   Wt Readings from Last 3 Encounters:  12/23/20 54.6 kg (120 lb 6.4 oz)  12/15/20 56.7 kg (125 lb)  12/02/20 54.4 kg (120 lb)   PHYSICAL EXAM: General:  NAD. No resp difficulty, thin. HEENT: Normal Neck: Supple. No JVD. Carotids 2+ bilat; no bruits. No lymphadenopathy or thryomegaly appreciated. Cor: PMI nondisplaced. Regular rate & rhythm. No rubs, gallops or murmurs. Lungs:  Clear Abdomen: Soft, nontender, nondistended. No hepatosplenomegaly. No bruits or masses. Good bowel sounds. Extremities: No cyanosis, clubbing, rash, edema Neuro: Alert & oriented x 3, cranial nerves grossly intact. Moves all 4 extremities w/o difficulty. Affect pleasant.  ASSESSMENT & PLAN:  1. Chronic systolic CHF: Cardiomyopathy of uncertain etiology.  No FH cardiomyopathy, negative HIV and normal TSH, no recent pregnancy.  She had been drinking heavily for several months prior to diagnosis, ETOH may contribute to her cardiomyopathy.  Uncontrolled HTN likely plays a role. LHC 04/2019 w/ no CAD, filling pressures optimized and preserved cardiac output. Cardiac MRI showed no myocardial LGE, so no definitive evidence for prior MI, infiltrative disease, or myocarditis. Echo in 3/21 showed EF 30-35%, echo in 1/22 with EF up to 40-45%, normal RV.   - Continue metoprolol 12.5 mg at bed time (off Entresto, spiro, Coreg due to reported low BP at home) - Can stop ivabradine. HR 72 today. She is agreeable to this, however in the past she has wanted to stay on because she feels an elevated HR exacerbates her anxiety, which precipitates her CP. Counseled her we can add it back if needed. - Narrow QRS 90 ms, not CRT candidate. She is out of ICD range with EF 40-45% on last echo.   2. HTN: Stable.  - No evidence for fibromuscular dysplasia on renal artery dopplers.  3. ETOH abuse: She reports she has quit ETOH since 08/16/20. - Congratulated on abstinence. 4. 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 Remains stable on repeat EKGs, 461 ms today. - On daily KCl for hypokalemia. 5. Anxiety: Marked generalized anxiety.  Feels better on sertraline. - Continue sertraline 25 mg daily.  - Follow up with PCP.  6. Palpitations, Syncope/ Orthostatic Hypotension: ? POTS in this young female. Saw Dr Graciela Husbands and he felt this was anxiety.   - No recent events.  - Zio patch- no arrhythmias.  - Continue lopressor.     Follow up in 3 months. She has been discharged from the Paramedicine program.  Anderson Malta  [place], [place] 12/23/20

## 2020-12-23 ENCOUNTER — Ambulatory Visit (HOSPITAL_COMMUNITY)
Admission: RE | Admit: 2020-12-23 | Discharge: 2020-12-23 | Disposition: A | Discharge status: Home / Self Care | Payer: Medicaid Other | Source: Ambulatory Visit | Attending: Family Medicine | Admitting: Family Medicine

## 2020-12-23 ENCOUNTER — Institutional Professional Consult (permissible substitution): Payer: Medicaid Other | Admitting: Internal Medicine

## 2020-12-23 ENCOUNTER — Other Ambulatory Visit: Payer: Self-pay

## 2020-12-23 ENCOUNTER — Encounter (HOSPITAL_COMMUNITY): Payer: Self-pay

## 2020-12-23 VITALS — BP 108/80 | HR 72 | Wt 120.4 lb

## 2020-12-23 DIAGNOSIS — I429 Cardiomyopathy, unspecified: Secondary | ICD-10-CM | POA: Insufficient documentation

## 2020-12-23 DIAGNOSIS — I5022 Chronic systolic (congestive) heart failure: Secondary | ICD-10-CM

## 2020-12-23 DIAGNOSIS — Z79899 Other long term (current) drug therapy: Secondary | ICD-10-CM | POA: Insufficient documentation

## 2020-12-23 DIAGNOSIS — E876 Hypokalemia: Secondary | ICD-10-CM | POA: Diagnosis not present

## 2020-12-23 DIAGNOSIS — F411 Generalized anxiety disorder: Secondary | ICD-10-CM | POA: Diagnosis not present

## 2020-12-23 DIAGNOSIS — R42 Dizziness and giddiness: Secondary | ICD-10-CM

## 2020-12-23 DIAGNOSIS — Z8616 Personal history of COVID-19: Secondary | ICD-10-CM | POA: Insufficient documentation

## 2020-12-23 DIAGNOSIS — R9431 Abnormal electrocardiogram [ECG] [EKG]: Secondary | ICD-10-CM

## 2020-12-23 DIAGNOSIS — R002 Palpitations: Secondary | ICD-10-CM

## 2020-12-23 DIAGNOSIS — F419 Anxiety disorder, unspecified: Secondary | ICD-10-CM

## 2020-12-23 DIAGNOSIS — I10 Essential (primary) hypertension: Secondary | ICD-10-CM

## 2020-12-23 DIAGNOSIS — I110 Hypertensive heart disease with heart failure: Secondary | ICD-10-CM | POA: Insufficient documentation

## 2020-12-23 DIAGNOSIS — Z885 Allergy status to narcotic agent status: Secondary | ICD-10-CM | POA: Diagnosis not present

## 2020-12-23 DIAGNOSIS — F1011 Alcohol abuse, in remission: Secondary | ICD-10-CM

## 2020-12-23 LAB — BASIC METABOLIC PANEL
Anion gap: 6 (ref 5–15)
BUN: 10 mg/dL (ref 6–20)
CO2: 24 mmol/L (ref 22–32)
Calcium: 9.3 mg/dL (ref 8.9–10.3)
Chloride: 106 mmol/L (ref 98–111)
Creatinine, Ser: 0.82 mg/dL (ref 0.44–1.00)
GFR, Estimated: >60 mL/min (ref >60)
Glucose, Bld: 94 mg/dL (ref 70–99)
Potassium: 4.1 mmol/L (ref 3.5–5.1)
Sodium: 136 mmol/L (ref 135–145)

## 2020-12-23 NOTE — Patient Instructions (Signed)
Stop corlanor   Labs today We will only contact you if something comes back abnormal or we need to make some changes. Otherwise no news is good news!  Keep follow up as scheduled  Do the following things EVERYDAY: Weigh yourself in the morning before breakfast. Write it down and keep it in a log. Take your medicines as prescribed Eat low salt foods--Limit salt (sodium) to 2000 mg per day.  Stay as active as you can everyday Limit all fluids for the day to less than 2 liters  At the [facility], you and your health needs are our priority. As part of our continuing mission to provide you with exceptional heart care, we have created designated Provider Care Teams. These Care Teams include your primary Cardiologist (physician) and Advanced Practice Providers (APPs- Physician Assistants and Nurse Practitioners) who all work together to provide you with the care you need, when you need it.   You may see any of the following providers on your designated Care Team at your next follow up: Dr Arvilla Meres Dr Marca Ancona Dr Brandon Melnick, NP Robbie Lis, [place] Mikki Santee Karle Plumber, PharmD   Please be sure to bring in all your medications bottles to every appointment.   If you have any questions or concerns before your next appointment please send [place] a message through [place] or call our office at [phone].    TO LEAVE A MESSAGE FOR THE NURSE SELECT OPTION 2, PLEASE LEAVE A MESSAGE INCLUDING: YOUR NAME DATE OF BIRTH CALL BACK NUMBER REASON FOR CALL**this is important as we prioritize the call backs  YOU WILL RECEIVE A CALL BACK THE SAME DAY AS LONG AS YOU CALL BEFORE 4:00 PM

## 2021-01-16 ENCOUNTER — Other Ambulatory Visit (HOSPITAL_COMMUNITY): Payer: Self-pay | Admitting: Cardiology

## 2021-01-16 ENCOUNTER — Other Ambulatory Visit: Payer: Self-pay | Admitting: Family Medicine

## 2021-01-16 DIAGNOSIS — R636 Underweight: Secondary | ICD-10-CM

## 2021-01-16 NOTE — Telephone Encounter (Signed)
   Notes to clinic:  verify for continued Short term medication    Requested Prescriptions  Pending Prescriptions Disp Refills   megestrol (MEGACE) 20 MG tablet [Pharmacy Med Name: MEGESTROL 20MG  TABLET] 30 tablet 0    Sig: TAKE 1 TABLET BY MOUTH ONCE A DAY.      OB/GYN:  Progestins Passed - 01/16/2021 12:06 PM      Passed - Valid encounter within last 12 months    Recent Outpatient Visits           1 month ago Screening for diabetes mellitus   [facility] [place], [place], MD   4 months ago Chronic systolic CHF (congestive heart failure) [facility])    [facility], MD

## 2021-01-19 ENCOUNTER — Other Ambulatory Visit (HOSPITAL_COMMUNITY): Payer: Self-pay | Admitting: *Deleted

## 2021-01-20 ENCOUNTER — Other Ambulatory Visit (HOSPITAL_COMMUNITY): Payer: Self-pay | Admitting: *Deleted

## 2021-01-20 MED ORDER — SERTRALINE HCL 25 MG PO TABS: 25.0000 mg | ORAL_TABLET | Freq: Every day | ORAL | 0 refills | Status: DC

## 2021-01-29 ENCOUNTER — Other Ambulatory Visit: Payer: Self-pay

## 2021-01-29 ENCOUNTER — Ambulatory Visit (HOSPITAL_COMMUNITY)
Admission: RE | Admit: 2021-01-29 | Discharge: 2021-01-29 | Disposition: A | Discharge status: Home / Self Care | Payer: Medicaid Other | Source: Ambulatory Visit | Attending: Cardiology | Admitting: Cardiology

## 2021-01-29 ENCOUNTER — Encounter (HOSPITAL_COMMUNITY): Payer: Self-pay | Admitting: Cardiology

## 2021-01-29 VITALS — BP 102/60 | HR 89 | Wt 124.2 lb

## 2021-01-29 DIAGNOSIS — Z79899 Other long term (current) drug therapy: Secondary | ICD-10-CM | POA: Insufficient documentation

## 2021-01-29 DIAGNOSIS — I110 Hypertensive heart disease with heart failure: Secondary | ICD-10-CM | POA: Diagnosis not present

## 2021-01-29 DIAGNOSIS — Z885 Allergy status to narcotic agent status: Secondary | ICD-10-CM | POA: Diagnosis not present

## 2021-01-29 DIAGNOSIS — I429 Cardiomyopathy, unspecified: Secondary | ICD-10-CM | POA: Diagnosis not present

## 2021-01-29 DIAGNOSIS — I5022 Chronic systolic (congestive) heart failure: Secondary | ICD-10-CM | POA: Diagnosis not present

## 2021-01-29 DIAGNOSIS — F1011 Alcohol abuse, in remission: Secondary | ICD-10-CM | POA: Diagnosis not present

## 2021-01-29 DIAGNOSIS — F411 Generalized anxiety disorder: Secondary | ICD-10-CM | POA: Insufficient documentation

## 2021-01-29 DIAGNOSIS — Z8616 Personal history of COVID-19: Secondary | ICD-10-CM | POA: Insufficient documentation

## 2021-01-29 LAB — BASIC METABOLIC PANEL
Anion gap: 7 (ref 5–15)
BUN: 8 mg/dL (ref 6–20)
CO2: 24 mmol/L (ref 22–32)
Calcium: 9 mg/dL (ref 8.9–10.3)
Chloride: 106 mmol/L (ref 98–111)
Creatinine, Ser: 0.83 mg/dL (ref 0.44–1.00)
GFR, Estimated: >60 mL/min (ref >60)
Glucose, Bld: 99 mg/dL (ref 70–99)
Potassium: 3.7 mmol/L (ref 3.5–5.1)
Sodium: 137 mmol/L (ref 135–145)

## 2021-01-29 LAB — BRAIN NATRIURETIC PEPTIDE: B Natriuretic Peptide: 9.4 pg/mL (ref 0.0–100.0)

## 2021-01-29 MED ORDER — SERTRALINE HCL 50 MG PO TABS: 50.0000 mg | ORAL_TABLET | Freq: Every day | ORAL | 5 refills | Status: DC

## 2021-01-29 MED ORDER — METOPROLOL SUCCINATE ER 25 MG PO TB24: 12.5000 mg | ORAL_TABLET | Freq: Every day | ORAL | 3 refills | Status: DC

## 2021-01-29 NOTE — Patient Instructions (Signed)
Labs done today. We will contact you only if your labs are abnormal.  STOP taking Metoprolol Tartrate  START Toprol XL 12.5mg  (1/2 tablet) by mouth daily at bedtime.  INCREASE Zoloft to 50mg  (1 tablet) by mouth daily.   No other medication changes were made. Please continue all current medications as prescribed.  Your physician recommends that you schedule a follow-up appointment in: 3 months with our APP Clinic here in our office  If you have any questions or concerns before your next appointment please send a message through [place] or call our office at [phone].    TO LEAVE A MESSAGE FOR THE NURSE SELECT OPTION 2, PLEASE LEAVE A MESSAGE INCLUDING: YOUR NAME DATE OF BIRTH CALL BACK NUMBER REASON FOR CALL**this is important as we prioritize the call backs  YOU WILL RECEIVE A CALL BACK THE SAME DAY AS LONG AS YOU CALL BEFORE 4:00 PM   Do the following things EVERYDAY: Weigh yourself in the morning before breakfast. Write it down and keep it in a log. Take your medicines as prescribed Eat low salt foods--Limit salt (sodium) to 2000 mg per day.  Stay as active as you can everyday Limit all fluids for the day to less than 2 liters   At the [facility], you and your health needs are our priority. As part of our continuing mission to provide you with exceptional heart care, we have created designated Provider Care Teams. These Care Teams include your primary Cardiologist (physician) and Advanced Practice Providers (APPs- Physician Assistants and Nurse Practitioners) who all work together to provide you with the care you need, when you need it.   You may see any of the following providers on your designated Care Team at your next follow up: Dr [phone] Dr Arvilla Meres, NP Carron Curie, Robbie Lis [place], PharmD   Please be sure to bring in all your medications bottles to every appointment. '

## 2021-01-29 NOTE — Progress Notes (Signed)
Advanced Heart Failure Clinic Note   PCP: [facility] PCP-Cardiologist: Dr. Shirlee Latch    HPI: 35 y.o. female with past history of HTN presented to [facility] on 05/10/19 with cough, SOB, LE swelling, orthopnea, bendopnea and 20lb weight gain over 3 months. She had a history of increased alcohol use over the several months prior with 7-8 airplane bottles of liquor 3-4 days per week. BNP was elevated and CTA showed pulmonary edema. Echo showed new onset systolic HF with EF 15% and decreased RV systolic function. Cardiology was consulted and she was transferred to [facility].   While admitted she was diuresed with IV lasix. Right and Left heart cath were done. LHC showed no significant coronary artery disease and RHC was within normal limits. Her cardiomyopathy is possibly secondary to recent increased alcohol use or uncontrolled HTN. Viral myocarditis was felt to be a possible etiology with recent cough, however cMRI showed no myocardial LGE, so no definitive evidence for prior MI, infiltrative disease, or myocarditis.  Also of note, she has no family history of cardiomyopathy, negative HIV, and TSH was normal.   Echo in 3/21 showed EF 30-35%, global hypokinesis, mild RV dilation with normal function, mild MR.   On 06/06/20, she had a CBD gummy with a high dosage of CBD in it.  Shortly afterwards, she became lightheaded and dizzy and actually passed out.  She had been out of her meds for several days and her BP was elevated when she went to the ER.  HS-TnI and D dimer were normal. She was discharged home.   Echo in 1/22 showed EF up to 40-45%. She wore a Zio patch in 1/22 with no significant events but only kept it on for about 4 days.   In 2/22, she was admitted with hypertensive urgency with syncope.  BP 218/110.  She had not been taking her evening BP med doses.  BP was controlled and she was discharged.  Later in 2/22, she went to the ER with chest pain.  Troponin was negative but BP was  high.  She was sent home.   Evaluated in clinic, 08/22/20 and was hypertensive. Also very anxious. EKG showed NSR w/ mildly prolonged QTc 474 ms. Labs reviewed and showed hypokalemia. KCl 20 daily was added. Amlodipine 5 mg daily also added. I also started sertraline 25 mg daily for marked anxiety.   Had return clinic f/u on 3/1. Just prior to that had been seen in the ED on 2/27 for continued anxiety and palpitations. She reported the ED physician gave her something for possible withdrawals, which had helped significantly. She had quit drinking 8 days prior.  BMP was checked on 2/27 and hypokalemia had resolved, K 3.5 (recently as low at 2.9). SCr ok at 0.79. She continued w/ frequent episodes of low BP at home and dizziness as well as fatigue. Coreg was reduced to 3.125 bid and amlodipine discontinued.   She was evaluated 11 times at different EDs for chest pain.  No cardiac findings. Felt to be due to anxiety.   Returned 10/28/20 for HF follow up. She was taking metoprolol once a day due to soft BP. Volume and symptoms stable.  Evaluated in ED 11/19/20, 12/02/20, and 12/15/20 for CP. Cardiac work up unremarkable, EKGs reassuring. Felt to be due to anxiety and possibly GERD.  Today she returns for HF follow up. She is no longer drinking ETOH.  Anxiety is better and BP has been better-controlled.  She is currently only taking  metoprolol tartrate 12.5 mg qhs.  No ER visits since 6/22.  Rare lightheadedness with standing.  No exertional dyspnea or chest pain.    Labs (11/20): K 3.8, creatinine 1.1 Labs (2/21): K 3.6, creatinine 0.89 Labs (12/21): BNP 145, K 3.7, creatinine 0.67, hgb 12.2 Labs (2/22): K 3.4, creatinine 0.79, hgb 11.5 Labs (2/22): K 3.5, Creatinine 0.79 Labs (3/22): K 3.8, Creatinine 0.76 Labs (5/22): K 3.2, creatinine 1.01 Labs (6/22): K 3.4 => 4.1, creatinine 0.85 => 0.82  PMH: 1. HTN - Renal artery dopplers (3/21): No evidence for renal artery stenosis.  2. ETOH abuse: Has quit.   3. Chronic systolic CHF: Nonischemic cardiomyopathy.  - LHC/RHC (11/20): No significant CAD; mean RA 1, PA 24/8, mean PCWP 5, CI 3.54.  - Echo (11/20): EF 15%, diffuse hypokinesis, mildly decreased RV systolic function.  - CMRI (11/20): LV mildly dilated with EF 16%, normal RV size with EF 28%, no myocardial LGE.  - Echo (3/21): EF 30-35%, global hypokinesis, mild RV dilation with normal function, mild MR.  - Echo (1/22): EF 40-45%, normal RV.  4. COVID-19 infection 9/21 5. Prolonged QT interval - Zio patch 3/22: No significant abnormalities.   Review of Systems: All systems reviewed and negative except as per HPI.   Current Outpatient Medications  Medication Sig Dispense Refill   esomeprazole (NEXIUM) 40 MG capsule Take 1 capsule (40 mg total) by mouth daily. 30 capsule 0   etonogestrel (NEXPLANON) 68 MG IMPL implant Inject 1 each into the skin continuous.     Iron, Ferrous Sulfate, 325 (65 Fe) MG TABS Take 325 mg by mouth daily. 60 tablet 3   megestrol (MEGACE) 20 MG tablet TAKE 1 TABLET BY MOUTH ONCE A DAY. 30 tablet 0   potassium chloride (KLOR-CON) 10 MEQ tablet TAKE 2 TABLETS DAILY. 60 tablet 0   metoprolol succinate (TOPROL XL) 25 MG 24 hr tablet Take 0.5 tablets (12.5 mg total) by mouth at bedtime. 45 tablet 3   sertraline (ZOLOFT) 50 MG tablet Take 1 tablet (50 mg total) by mouth daily. 30 tablet 5   No current facility-administered medications for this encounter.   Allergies  Allergen Reactions   Fentanyl Other (See Comments)    Headaches   Social History   Socioeconomic History   Marital status: Single    Spouse name: Not on file   Number of children: Not on file   Years of education: Not on file   Highest education level: Not on file  Occupational History   Not on file  Tobacco Use   Smoking status: Never   Smokeless tobacco: Never  Vaping Use   Vaping Use: Never used  Substance and Sexual Activity   Alcohol use: Not Currently   Drug use: No   Sexual  activity: Yes    Birth control/protection: Implant  Other Topics Concern   Not on file  Social History Narrative   Not on file   Social Determinants of Health   Financial Resource Strain: Low Risk    Difficulty of Paying Living Expenses: Not hard at all  Food Insecurity: No Food Insecurity   Worried About Programme researcher, broadcasting/film/video in the Last Year: Never true   Ran Out of Food in the Last Year: Never true  Transportation Needs: No Transportation Needs   Lack of Transportation (Medical): No   Lack of Transportation (Non-Medical): No  Physical Activity: Inactive   Days of Exercise per Week: 0 days   Minutes of Exercise per Session:  0 min  Stress: Not on file  Social Connections: Not on file  Intimate Partner Violence: Not on file   Family History  Problem Relation Age of Onset   Diabetes Maternal Grandmother    Cancer Paternal Grandfather        prancreatic   COPD Maternal Grandfather    Cancer Maternal Grandfather        prostate   Asthma Maternal Grandfather    Asthma Son    Bronchitis Son    BP 102/60   Pulse 89   Wt 56.3 kg (124 lb 3.2 oz)   SpO2 100%   BMI 18.34 kg/m   Wt Readings from Last 3 Encounters:  01/29/21 56.3 kg (124 lb 3.2 oz)  12/23/20 54.6 kg (120 lb 6.4 oz)  12/15/20 56.7 kg (125 lb)   PHYSICAL EXAM: General: NAD Neck: No JVD, no thyromegaly or thyroid nodule.  Lungs: Clear to auscultation bilaterally with normal respiratory effort. CV: Nondisplaced PMI.  Heart regular S1/S2, no S3/S4, no murmur.  No peripheral edema.  No carotid bruit.  Normal pedal pulses.  Abdomen: Soft, nontender, no hepatosplenomegaly, no distention.  Skin: Intact without lesions or rashes.  Neurologic: Alert and oriented x 3.  Psych: Normal affect. Extremities: No clubbing or cyanosis.  HEENT: Normal.   ASSESSMENT & PLAN:  1. Chronic systolic CHF: Cardiomyopathy of uncertain etiology.  No FH cardiomyopathy, negative HIV and normal TSH, no recent pregnancy.  She had been  drinking heavily for several months prior to diagnosis, ETOH may contribute to her cardiomyopathy.  Uncontrolled HTN likely plays a role. LHC 04/2019 w/ no CAD, filling pressures optimized and preserved cardiac output. Cardiac MRI showed no myocardial LGE, so no definitive evidence for prior MI, infiltrative disease, or myocarditis. Echo in 3/21 showed EF 30-35%, echo in 1/22 with EF up to 40-45%, normal RV.  She has not been drinking for several months.  BP now running low and only taking metoprolol tartrate.  - Stop metoprolol tartrate, start Toprol XL 12.5 mg qhs.  - No BP room for Entresto, spironolactone.  - Narrow QRS 90 ms, not CRT candidate. She is out of ICD range with EF 40-45% on last echo.   2. HTN: BP now running low, suspect lower now that she is no longer drinking ETOH.  - No evidence for fibromuscular dysplasia on renal artery dopplers.  3. ETOH abuse: She reports she has quit ETOH since 08/16/20. - Congratulated on abstinence. 4. 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  - Check BMET/Mg today.  5. Anxiety: Marked generalized anxiety.  Feels better on sertraline but still anxious. - Increase sertraline to 50 mg daily.   - Follow up with PCP.  6. Palpitations, Syncope/ Orthostatic Hypotension: ? POTS in this young female. Saw Dr Graciela Husbands and he felt this was anxiety.   Zio patch in 3/22 with no arrhythmias.  - Continue Toprol XL.   Follow up in 3 months with APP.   Marca Ancona, MD 01/29/21

## 2021-01-30 ENCOUNTER — Encounter (HOSPITAL_COMMUNITY): Payer: Self-pay | Admitting: *Deleted

## 2021-02-23 ENCOUNTER — Telehealth: Payer: Self-pay

## 2021-02-23 MED ORDER — DICLOFENAC SODIUM 1 % EX GEL: 4.0000 g | Freq: Four times a day (QID) | CUTANEOUS | 1 refills | Status: DC

## 2021-02-23 NOTE — Telephone Encounter (Signed)
I have sent a prescription for Voltaren gel to her pharmacy.

## 2021-02-23 NOTE — Telephone Encounter (Signed)
Routing to PCP for review.

## 2021-02-23 NOTE — Telephone Encounter (Signed)
Pt was advised by the hospital to schedule an appt with Dr. Newlin/   Pt stated that she was prescribed lidocaine (LIDODERM) 5 % but cannot use them due to being on ivabradine (CORLANOR) tablets  She stated she is not suppose to use those patches if she is taking Corlanor / please advise asap of an alternative// pt states she has back pain

## 2021-02-24 ENCOUNTER — Other Ambulatory Visit: Payer: Self-pay | Admitting: Family Medicine

## 2021-02-24 DIAGNOSIS — R636 Underweight: Secondary | ICD-10-CM

## 2021-02-25 NOTE — Telephone Encounter (Signed)
Patient aware of message

## 2021-02-25 NOTE — Telephone Encounter (Signed)
Requested medications are due for refill today.  unknown  Requested medications are on the active medications list.  yes  Last refill. 01/17/2021 - Megace, 11/20/2020 - Nexium  Future visit scheduled.   no  Notes to clinic. Per note of 11/18/2020 megace was a short term medication.Refill was verified be for last refill. Nexium Rx was signed by Dr. Hyacinth Meeker.

## 2021-03-05 ENCOUNTER — Emergency Department (HOSPITAL_COMMUNITY): Payer: Medicaid Other

## 2021-03-05 ENCOUNTER — Other Ambulatory Visit: Payer: Self-pay

## 2021-03-05 ENCOUNTER — Emergency Department (HOSPITAL_COMMUNITY)
Admission: EM | Admit: 2021-03-05 | Discharge: 2021-03-05 | Disposition: A | Discharge status: Home / Self Care | Payer: Medicaid Other | Attending: Emergency Medicine | Admitting: Emergency Medicine

## 2021-03-05 DIAGNOSIS — R0789 Other chest pain: Secondary | ICD-10-CM | POA: Diagnosis not present

## 2021-03-05 DIAGNOSIS — I110 Hypertensive heart disease with heart failure: Secondary | ICD-10-CM | POA: Insufficient documentation

## 2021-03-05 DIAGNOSIS — Z79899 Other long term (current) drug therapy: Secondary | ICD-10-CM | POA: Diagnosis not present

## 2021-03-05 DIAGNOSIS — I5022 Chronic systolic (congestive) heart failure: Secondary | ICD-10-CM | POA: Insufficient documentation

## 2021-03-05 DIAGNOSIS — R000 Tachycardia, unspecified: Secondary | ICD-10-CM | POA: Insufficient documentation

## 2021-03-05 DIAGNOSIS — R0602 Shortness of breath: Secondary | ICD-10-CM | POA: Insufficient documentation

## 2021-03-05 DIAGNOSIS — R110 Nausea: Secondary | ICD-10-CM | POA: Insufficient documentation

## 2021-03-05 DIAGNOSIS — R079 Chest pain, unspecified: Secondary | ICD-10-CM | POA: Diagnosis present

## 2021-03-05 LAB — CBC WITH DIFFERENTIAL/PLATELET
Abs Immature Granulocytes: 0.01 10*3/uL (ref 0.00–0.07)
Basophils Absolute: 0 10*3/uL (ref 0.0–0.1)
Basophils Relative: 1 %
Eosinophils Absolute: 0.1 10*3/uL (ref 0.0–0.5)
Eosinophils Relative: 2 %
HCT: 35.4 % — ABNORMAL LOW (ref 36.0–46.0)
Hemoglobin: 11.2 g/dL — ABNORMAL LOW (ref 12.0–15.0)
Immature Granulocytes: 0 %
Lymphocytes Relative: 41 %
Lymphs Abs: 1.6 10*3/uL (ref 0.7–4.0)
MCH: 27.7 pg (ref 26.0–34.0)
MCHC: 31.6 g/dL (ref 30.0–36.0)
MCV: 87.4 fL (ref 80.0–100.0)
Monocytes Absolute: 0.4 10*3/uL (ref 0.1–1.0)
Monocytes Relative: 10 %
Neutro Abs: 1.8 10*3/uL (ref 1.7–7.7)
Neutrophils Relative %: 46 %
Platelets: 184 10*3/uL (ref 150–400)
RBC: 4.05 MIL/uL (ref 3.87–5.11)
RDW: 12.7 % (ref 11.5–15.5)
WBC: 4 10*3/uL (ref 4.0–10.5)
nRBC: 0 % (ref 0.0–0.2)

## 2021-03-05 LAB — BRAIN NATRIURETIC PEPTIDE: B Natriuretic Peptide: 16 pg/mL (ref 0.0–100.0)

## 2021-03-05 LAB — COMPREHENSIVE METABOLIC PANEL
ALT: 23 U/L (ref 0–44)
AST: 22 U/L (ref 15–41)
Albumin: 4.2 g/dL (ref 3.5–5.0)
Alkaline Phosphatase: 44 U/L (ref 38–126)
Anion gap: 6 (ref 5–15)
BUN: 11 mg/dL (ref 6–20)
CO2: 24 mmol/L (ref 22–32)
Calcium: 8.8 mg/dL — ABNORMAL LOW (ref 8.9–10.3)
Chloride: 108 mmol/L (ref 98–111)
Creatinine, Ser: 0.81 mg/dL (ref 0.44–1.00)
GFR, Estimated: >60 mL/min (ref >60)
Glucose, Bld: 102 mg/dL — ABNORMAL HIGH (ref 70–99)
Potassium: 3.7 mmol/L (ref 3.5–5.1)
Sodium: 138 mmol/L (ref 135–145)
Total Bilirubin: 0.6 mg/dL (ref 0.3–1.2)
Total Protein: 6.9 g/dL (ref 6.5–8.1)

## 2021-03-05 LAB — RAPID URINE DRUG SCREEN, HOSP PERFORMED
Amphetamines: NOT DETECTED
Barbiturates: NOT DETECTED
Benzodiazepines: NOT DETECTED
Cocaine: NOT DETECTED
Opiates: POSITIVE — AB
Tetrahydrocannabinol: NOT DETECTED

## 2021-03-05 LAB — POC URINE PREG, ED: Preg Test, Ur: NEGATIVE

## 2021-03-05 LAB — TROPONIN I (HIGH SENSITIVITY)
Troponin I (High Sensitivity): <2 ng/L (ref <18)
Troponin I (High Sensitivity): 2 ng/L (ref <18)

## 2021-03-05 LAB — HCG, SERUM, QUALITATIVE: Preg, Serum: NEGATIVE

## 2021-03-05 LAB — LIPASE, BLOOD: Lipase: 30 U/L (ref 11–51)

## 2021-03-05 LAB — ETHANOL: Alcohol, Ethyl (B): <10 mg/dL (ref <10)

## 2021-03-05 MED ORDER — HYDROMORPHONE HCL 1 MG/ML IJ SOLN
0.5000 mg | Freq: Once | INTRAMUSCULAR | Status: AC
  Administered 2021-03-05: 0.5 mg via INTRAVENOUS
  Filled 2021-03-05: qty 1

## 2021-03-05 MED ORDER — IBUPROFEN 100 MG/5ML PO SUSP: 600.0000 mg | Freq: Once | ORAL | Status: DC

## 2021-03-05 MED ORDER — HYDROCODONE-ACETAMINOPHEN 5-325 MG PO TABS: 1.0000 | ORAL_TABLET | Freq: Four times a day (QID) | ORAL | 0 refills | Status: DC | PRN

## 2021-03-05 MED ORDER — IOHEXOL 350 MG/ML SOLN
80.0000 mL | Freq: Once | INTRAVENOUS | Status: AC | PRN
  Administered 2021-03-05: 80 mL via INTRAVENOUS

## 2021-03-05 NOTE — Discharge Instructions (Signed)
Follow-up with cardiology.  Today's work-up without evidence of any acute findings in the lung or chest.  No evidence of blood clot.  No evidence of an acute cardiac event.  Take the pain medication as directed.

## 2021-03-05 NOTE — ED Provider Notes (Signed)
[facility] EMERGENCY DEPARTMENT Provider Note   CSN: [id number] Arrival date & time: 03/05/21  0749     History Chief Complaint  Patient presents with   Chest Pain    Tina Fletcher is a 35 y.o. female.  Patient with onset of intermittent chest pain at 2200 last evening.  Longest lasting period of time is 15 to 30 seconds.  Some of the pain is been on the right side sometimes is on the left side.  Sometimes both.  Patient denied any vomiting to me.  Did state there was some nausea and some shortness of breath.  Patient with a recent extensive work-up for chronic systolic congestive heart failure seen by cardiology the last time on August 4.  The exact cause of this was not clear.  They felt that it could have been an alcoholic cardiomyopathy.  No evidence of infection.  Patient has been taken off all her Lasix.  No leg swelling.  Seems to have improved from that.      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 complication    Hypertension    Medical history non-contributory    Migraines    Nexplanon insertion 08/13/2013   08/13/13 inserted left arm, remove 08/13/16   Vitamin D deficiency 04/01/2016    Patient Active Problem List   Diagnosis Date Noted   Hypertensive crisis 08/18/2020   Hypokalemia 08/18/2020   Prolonged QT interval 08/18/2020   Syncope    Chronic systolic CHF (congestive heart failure) (HCC)    Cervical cancer screening 10/23/2019   Systolic CHF, acute (HCC) 05/10/2019   Vitamin D deficiency 04/01/2016   Nexplanon insertion 08/13/2013   Thrombocytopenia (HCC) 04/17/2013   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   Underweight 01/08/2013    Past Surgical History:  Procedure Laterality Date   RIGHT/LEFT HEART CATH AND CORONARY ANGIOGRAPHY N/A 05/14/2019   Procedure: RIGHT/LEFT HEART CATH AND CORONARY ANGIOGRAPHY;  Surgeon: Laurey Morale, MD;   Location: [facility] INVASIVE CV LAB;  Service: Cardiovascular;  Laterality: N/A;     OB History     Gravida  2   Para  2   Term  2   Preterm      AB      Living  2      SAB      IAB      Ectopic      Multiple      Live Births  2           Family History  Problem Relation Age of Onset   Diabetes Maternal Grandmother    Cancer Paternal Grandfather        prancreatic   COPD Maternal Grandfather    Cancer Maternal Grandfather        prostate   Asthma Maternal Grandfather    Asthma Son    Bronchitis Son     Social History   Tobacco Use   Smoking status: Never   Smokeless tobacco: Never  Vaping Use   Vaping Use: Never used  Substance Use Topics   Alcohol use: Not Currently   Drug use: No    Home Medications Prior to Admission medications   Medication Sig Start Date End Date Taking? Authorizing Provider  diclofenac Sodium (VOLTAREN) 1 % GEL Apply 4 g topically 4 (four) times daily. Patient taking differently: Apply 4 g topically 4 (four) times daily  as needed (painful muscles). 02/23/21  Yes Hoy Register, MD  etonogestrel (NEXPLANON) 68 MG IMPL implant Inject 1 each into the skin continuous.   Yes [provider]  HYDROcodone-acetaminophen (NORCO/VICODIN) 5-325 MG tablet Take 1-2 tablets by mouth every 6 (six) hours as needed. 03/05/21  Yes Vanetta Mulders, MD  ibuprofen (ADVIL) 800 MG tablet Take 800 mg by mouth 3 (three) times daily as needed for fever or headache. 12/26/20  Yes [provider]  Iron, Ferrous Sulfate, 325 (65 Fe) MG TABS Take 325 mg by mouth daily. 11/18/20  Yes Hoy Register, MD  metoprolol succinate (TOPROL XL) 25 MG 24 hr tablet Take 0.5 tablets (12.5 mg total) by mouth at bedtime. Patient taking differently: Take 12.5 mg by mouth daily. 01/29/21 01/29/22 Yes Laurey Morale, MD  potassium chloride (KLOR-CON) 10 MEQ tablet TAKE 2 TABLETS DAILY. Patient taking differently: Take 20 mEq by mouth daily. 10/15/20  Yes Laurey Morale, MD  sertraline (ZOLOFT) 50 MG tablet Take 1 tablet (50 mg total) by mouth daily. 01/29/21  Yes Laurey Morale, MD    Allergies    Fentanyl  Review of Systems   Review of Systems  Constitutional:  Negative for chills and fever.  HENT:  Negative for ear pain and sore throat.   Eyes:  Negative for pain and visual disturbance.  Respiratory:  Positive for shortness of breath. Negative for cough.   Cardiovascular:  Positive for chest pain. Negative for palpitations and leg swelling.  Gastrointestinal:  Positive for nausea. Negative for abdominal pain and vomiting.  Genitourinary:  Negative for dysuria and hematuria.  Musculoskeletal:  Negative for arthralgias and back pain.  Skin:  Negative for color change and rash.  Neurological:  Negative for seizures and syncope.  All other systems reviewed and are negative.  Physical Exam Updated Vital Signs BP (!) 113/94   Pulse 84   Temp 98.3 F (36.8 C) (Oral)   Resp (!) 22   Ht 1.753 m (5\' 9" )   Wt 59 kg   SpO2 100%   BMI 19.20 kg/m   Physical Exam Vitals and nursing note reviewed.  Constitutional:      General: She is not in acute distress.    Appearance: Normal appearance. She is well-developed.  HENT:     Head: Normocephalic and atraumatic.  Eyes:     Extraocular Movements: Extraocular movements intact.     Conjunctiva/sclera: Conjunctivae normal.     Pupils: Pupils are equal, round, and reactive to light.  Cardiovascular:     Rate and Rhythm: Normal rate and regular rhythm.     Heart sounds: No murmur heard. Pulmonary:     Effort: Pulmonary effort is normal. No respiratory distress.     Breath sounds: Normal breath sounds.  Chest:     Chest wall: No tenderness.  Abdominal:     Palpations: Abdomen is soft.     Tenderness: There is no abdominal tenderness.  Musculoskeletal:        General: No swelling. Normal range of motion.     Cervical back: Normal range of motion and neck supple.  Skin:    General: Skin  is warm and dry.     Capillary Refill: Capillary refill takes less than 2 seconds.  Neurological:     General: No focal deficit present.     Mental Status: She is alert and oriented to person, place, and time.    ED Results / Procedures / Treatments   Labs (all  labs ordered are listed, but only abnormal results are displayed) Labs Reviewed  RAPID URINE DRUG SCREEN, HOSP PERFORMED - Abnormal; Notable for the following components:      Result Value   Opiates POSITIVE (*)    All other components within normal limits  COMPREHENSIVE METABOLIC PANEL - Abnormal; Notable for the following components:   Glucose, Bld 102 (*)    Calcium 8.8 (*)    All other components within normal limits  CBC WITH DIFFERENTIAL/PLATELET - Abnormal; Notable for the following components:   Hemoglobin 11.2 (*)    HCT 35.4 (*)    All other components within normal limits  ETHANOL  LIPASE, BLOOD  BRAIN NATRIURETIC PEPTIDE  HCG, SERUM, QUALITATIVE  POC URINE PREG, ED  TROPONIN I (HIGH SENSITIVITY)  TROPONIN I (HIGH SENSITIVITY)    EKG EKG Interpretation  Date/Time:  Thursday March 05 2021 07:58:58 EDT Ventricular Rate:  127 PR Interval:  153 QRS Duration: 75 QT Interval:  286 QTC Calculation: 416 R Axis:   113 Text Interpretation: Sinus tachycardia Probable left atrial enlargement Right axis deviation Nonspecific repol abnormality, diffuse leads Confirmed by Vanetta Mulders [phone]) on 03/05/2021 8:26:49 AM  Radiology CT Angio Chest PE W/Cm &/Or Wo Cm  Result Date: 03/05/2021 CLINICAL DATA:  Intermittent chest pain since 19 hours last night. 8/10 in intensity. History of hypertension and CHF. Nausea and shortness of breath. EXAM: CT ANGIOGRAPHY CHEST WITH CONTRAST TECHNIQUE: Multidetector CT imaging of the chest was performed using the standard protocol during bolus administration of intravenous contrast. Multiplanar CT image reconstructions and MIPs were obtained to evaluate the vascular anatomy.  CONTRAST:  35mL OMNIPAQUE IOHEXOL 350 MG/ML SOLN COMPARISON:  09/27/2020 FINDINGS: Cardiovascular: Satisfactory opacification of the pulmonary arteries to the segmental level. No evidence of pulmonary embolism. Normal heart size. No pericardial effusion. Mediastinum/Nodes: No enlarged mediastinal, hilar, or axillary lymph nodes. Thyroid gland, trachea, and esophagus demonstrate no significant findings. Lungs/Pleura: Lungs are clear. No pleural effusion or pneumothorax. Upper Abdomen: No acute abnormality. Musculoskeletal: No chest wall abnormality. No acute or significant osseous findings. Review of the MIP images confirms the above findings. IMPRESSION: No acute abnormality of the chest. Electronically Signed   By: Acquanetta Belling M.D.   On: 03/05/2021 12:59   DG Chest Port 1 View  Result Date: 03/05/2021 CLINICAL DATA:  Chest pain EXAM: PORTABLE CHEST 1 VIEW COMPARISON:  Chest radiograph 12/15/2020 FINDINGS: The cardiomediastinal silhouette is normal. The lungs clear, with no focal consolidation or pulmonary edema. There is no pleural effusion or pneumothorax. There is no acute osseous abnormality. IMPRESSION: No radiographic evidence of acute cardiopulmonary process. Electronically Signed   By: Lesia Hausen M.D.   On: 03/05/2021 08:58    Procedures Procedures   Medications Ordered in ED Medications  HYDROmorphone (DILAUDID) injection 0.5 mg (0.5 mg Intravenous Given 03/05/21 1042)  iohexol (OMNIPAQUE) 350 MG/ML injection 80 mL (80 mLs Intravenous Contrast Given 03/05/21 1204)    ED Course  I have reviewed the triage vital signs and the nursing notes.  Pertinent labs & imaging results that were available during my care of the patient were reviewed by me and considered in my medical decision making (see chart for details).    MDM Rules/Calculators/A&P                           Extensive work-up to include troponins x2 negative.  Urine drug screen positive for opiates but negative otherwise.   Patient's alcohol  level was 0.  Pregnancy test negative.  Chest x-ray without any acute findings but due to the shortness of breath and the nature of the chest pain and being a little bit tachycardic on her EKG CT angio chest was done negative for pulmonary embolus negative for any acute findings.  Exact cause of the chest pain not clear but does not seem to be pulmonary or cardiac in nature.  Patient stable for discharge home follow-up with cardiology Final Clinical Impression(s) / ED Diagnoses Final diagnoses:  Atypical chest pain    Rx / DC Orders ED Discharge Orders          Ordered    HYDROcodone-acetaminophen (NORCO/VICODIN) 5-325 MG tablet  Every 6 hours PRN        03/05/21 1403             Vanetta Mulders, MD 03/05/21 1410

## 2021-03-05 NOTE — ED Triage Notes (Signed)
Patient states intermittent CP since 1900 last night and now chest pain 8/10. Patient tearful in triage. Hx of HTN and CHF. Patient states nausea and SHOB.

## 2021-03-10 ENCOUNTER — Other Ambulatory Visit: Payer: Self-pay | Admitting: Family Medicine

## 2021-03-10 NOTE — Telephone Encounter (Signed)
Relation to pt: self  Call back number: [phone]  Pharmacy:  [facility] [place] [place] - [address] Phone:  [phone]  Fax:  [phone]      Reason for call:  Patient requesting a HYDROcodone-acetaminophen (NORCO/VICODIN) 5-325 MG tablet, patient states she is completley out. Patient saw PCP on 11/18/2020. Patient contacted Pharmacy and was advised to call PCP office because she is out.

## 2021-03-10 NOTE — Telephone Encounter (Signed)
We don't prescribe narcotics at this office

## 2021-03-10 NOTE — Telephone Encounter (Signed)
Requested medication (s) are due for refill today: see request  for enough tabs until next refill   Requested medication (s) are on the active medication list: yes  Last refill:  03/05/21 #12 0 refills  Future visit scheduled: no  Notes to clinic:  not delegated per protocol . Last ordered by Darlyn Chamber, MD     Requested Prescriptions  Pending Prescriptions Disp Refills   HYDROcodone-acetaminophen (NORCO/VICODIN) 5-325 MG tablet 12 tablet 0    Sig: Take 1-2 tablets by mouth every 6 (six) hours as needed.     Not Delegated - Analgesics:  Opioid Agonist Combinations Failed - 03/10/2021  4:58 PM      Failed - This refill cannot be delegated      Passed - Urine Drug Screen completed in last 360 days      Passed - Valid encounter within last 6 months    Recent Outpatient Visits           3 months ago Screening for diabetes mellitus   [facility] [place], [place], MD   5 months ago Chronic systolic CHF (congestive heart failure) [facility])   [facility] Hoy Register, MD

## 2021-03-10 NOTE — Telephone Encounter (Signed)
Requested medication (s) are due for refill today: Yes  Requested medication (s) are on the active medication list: Yes  Last refill:  03/05/21  Future visit scheduled: No  Notes to clinic:  Unable to refill per protocol, last refill by another provider.      Requested Prescriptions  Pending Prescriptions Disp Refills   HYDROcodone-acetaminophen (NORCO/VICODIN) 5-325 MG tablet 12 tablet 0    Sig: Take 1-2 tablets by mouth every 6 (six) hours as needed.     Not Delegated - Analgesics:  Opioid Agonist Combinations Failed - 03/10/2021  1:05 PM      Failed - This refill cannot be delegated      Passed - Urine Drug Screen completed in last 360 days      Passed - Valid encounter within last 6 months    Recent Outpatient Visits           3 months ago Screening for diabetes mellitus   [facility] [place] [place], MD   5 months ago Chronic systolic CHF (congestive heart failure) [facility])    [facility] Hoy Register, MD

## 2021-03-10 NOTE — Telephone Encounter (Signed)
Patient says is completely out of HYDROcodone-acetaminophen (NORCO/VICODIN) 5-325 MG , asking for short supply until refill comes in

## 2021-04-04 ENCOUNTER — Emergency Department (HOSPITAL_COMMUNITY): Payer: Medicaid Other

## 2021-04-04 ENCOUNTER — Other Ambulatory Visit: Payer: Self-pay

## 2021-04-04 ENCOUNTER — Telehealth: Payer: Self-pay | Admitting: Family Medicine

## 2021-04-04 ENCOUNTER — Encounter (HOSPITAL_COMMUNITY): Payer: Self-pay | Admitting: Emergency Medicine

## 2021-04-04 ENCOUNTER — Emergency Department (HOSPITAL_COMMUNITY)
Admission: EM | Admit: 2021-04-04 | Discharge: 2021-04-04 | Disposition: A | Discharge status: Home / Self Care | Payer: Medicaid Other | Attending: Emergency Medicine | Admitting: Emergency Medicine

## 2021-04-04 DIAGNOSIS — E876 Hypokalemia: Secondary | ICD-10-CM | POA: Insufficient documentation

## 2021-04-04 DIAGNOSIS — D649 Anemia, unspecified: Secondary | ICD-10-CM | POA: Diagnosis not present

## 2021-04-04 DIAGNOSIS — R109 Unspecified abdominal pain: Secondary | ICD-10-CM | POA: Diagnosis not present

## 2021-04-04 DIAGNOSIS — R0789 Other chest pain: Secondary | ICD-10-CM | POA: Insufficient documentation

## 2021-04-04 DIAGNOSIS — I5022 Chronic systolic (congestive) heart failure: Secondary | ICD-10-CM | POA: Diagnosis not present

## 2021-04-04 DIAGNOSIS — Z79899 Other long term (current) drug therapy: Secondary | ICD-10-CM | POA: Diagnosis not present

## 2021-04-04 DIAGNOSIS — I110 Hypertensive heart disease with heart failure: Secondary | ICD-10-CM | POA: Diagnosis not present

## 2021-04-04 LAB — CBC
HCT: 37.8 % (ref 36.0–46.0)
Hemoglobin: 11.9 g/dL — ABNORMAL LOW (ref 12.0–15.0)
MCH: 27.5 pg (ref 26.0–34.0)
MCHC: 31.5 g/dL (ref 30.0–36.0)
MCV: 87.5 fL (ref 80.0–100.0)
Platelets: 221 10*3/uL (ref 150–400)
RBC: 4.32 MIL/uL (ref 3.87–5.11)
RDW: 12.9 % (ref 11.5–15.5)
WBC: 4.8 10*3/uL (ref 4.0–10.5)
nRBC: 0 % (ref 0.0–0.2)

## 2021-04-04 LAB — BASIC METABOLIC PANEL
Anion gap: 7 (ref 5–15)
BUN: 14 mg/dL (ref 6–20)
CO2: 23 mmol/L (ref 22–32)
Calcium: 9.1 mg/dL (ref 8.9–10.3)
Chloride: 106 mmol/L (ref 98–111)
Creatinine, Ser: 0.88 mg/dL (ref 0.44–1.00)
GFR, Estimated: >60 mL/min (ref >60)
Glucose, Bld: 90 mg/dL (ref 70–99)
Potassium: 3.4 mmol/L — ABNORMAL LOW (ref 3.5–5.1)
Sodium: 136 mmol/L (ref 135–145)

## 2021-04-04 LAB — TROPONIN I (HIGH SENSITIVITY)
Troponin I (High Sensitivity): <2 ng/L (ref <18)
Troponin I (High Sensitivity): <2 ng/L (ref <18)

## 2021-04-04 MED ORDER — CYCLOBENZAPRINE HCL 10 MG PO TABS
10.0000 mg | ORAL_TABLET | Freq: Once | ORAL | Status: AC
  Administered 2021-04-04: 10 mg via ORAL
  Filled 2021-04-04: qty 1

## 2021-04-04 MED ORDER — LIDOCAINE VISCOUS HCL 2 % MT SOLN
15.0000 mL | Freq: Once | OROMUCOSAL | Status: AC
  Administered 2021-04-04: 15 mL via ORAL
  Filled 2021-04-04: qty 15

## 2021-04-04 MED ORDER — OMEPRAZOLE 20 MG PO CPDR: 20.0000 mg | DELAYED_RELEASE_CAPSULE | Freq: Every day | ORAL | 0 refills | Status: DC

## 2021-04-04 MED ORDER — ALUM & MAG HYDROXIDE-SIMETH 200-200-20 MG/5ML PO SUSP
30.0000 mL | Freq: Once | ORAL | Status: AC
  Administered 2021-04-04: 30 mL via ORAL
  Filled 2021-04-04: qty 30

## 2021-04-04 NOTE — ED Triage Notes (Signed)
Pt to the ED with complaints of central chest pain that radiates down her left arm described as sharp and aching.  Pt states the pain began last night and she had a Hx of CHF and Htn.

## 2021-04-04 NOTE — ED Provider Notes (Addendum)
[facility] EMERGENCY DEPARTMENT Provider Note   CSN: [id number] Arrival date & time: 04/04/21  1610     History Chief Complaint  Patient presents with   Chest Pain    Tina Fletcher is a 35 y.o. female.  HPI  Patient with significant medical history of hypertension, alcohol use, diastolic CHF EF of 40 to 45 presents with chief complaint of substernal chest pain.  Patient states pain started last night, came on suddenly, she was watching TV and pain started, pain radiating down to her left arm, describes as a sharp-like sensation, pain is intermittent, does worsen when she is ambulating, improved while she is resting, she denies shortness of breath, dyspnea, orthopnea or worsening pedal edema, is not associated with nausea, vomiting, lightheaded or dizziness no diaphoresis present.  States that she has had this pain in the past, states it feels unchanged from prior, she does have history of acid reflux and states she is unsure if this is the cause . she states she has been unable to take her acid pills and her pain medication.   She denies IV drug use, denies history of smoking, she is nondiabetic, has no other complaints at this time.  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 complication    Hypertension    Medical history non-contributory    Migraines    Nexplanon insertion 08/13/2013   08/13/13 inserted left arm, remove 08/13/16   Vitamin D deficiency 04/01/2016    Patient Active Problem List   Diagnosis Date Noted   Hypertensive crisis 08/18/2020   Hypokalemia 08/18/2020   Prolonged QT interval 08/18/2020   Syncope    Chronic systolic CHF (congestive heart failure) (HCC)    Cervical cancer screening 10/23/2019   Systolic CHF, acute (HCC) 05/10/2019   Vitamin D deficiency 04/01/2016   Nexplanon insertion 08/13/2013   Thrombocytopenia (HCC) 04/17/2013   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   Underweight 01/08/2013    Past Surgical History:  Procedure Laterality Date   RIGHT/LEFT HEART CATH AND CORONARY ANGIOGRAPHY N/A 05/14/2019   Procedure: RIGHT/LEFT HEART CATH AND CORONARY ANGIOGRAPHY;  Surgeon: Laurey Morale, MD;  Location: [facility] INVASIVE CV LAB;  Service: Cardiovascular;  Laterality: N/A;     OB History     Gravida  2   Para  2   Term  2   Preterm      AB      Living  2      SAB      IAB      Ectopic      Multiple      Live Births  2           Family History  Problem Relation Age of Onset   Diabetes Maternal Grandmother    Cancer Paternal Grandfather        prancreatic   COPD Maternal Grandfather    Cancer Maternal Grandfather        prostate   Asthma Maternal Grandfather    Asthma Son    Bronchitis Son     Social History   Tobacco Use   Smoking status: Never   Smokeless tobacco: Never  Vaping Use   Vaping Use: Never used  Substance Use Topics   Alcohol use: Not Currently   Drug use: No    Home Medications Prior to Admission medications   Medication Sig Start Date  End Date Taking? Authorizing Provider  etonogestrel (NEXPLANON) 68 MG IMPL implant Inject 1 each into the skin continuous.   Yes [provider]  HYDROcodone-acetaminophen (NORCO/VICODIN) 5-325 MG tablet Take 1-2 tablets by mouth every 6 (six) hours as needed. 03/05/21  Yes Vanetta Mulders, MD  ibuprofen (ADVIL) 800 MG tablet Take 800 mg by mouth 3 (three) times daily as needed for fever or headache. 12/26/20  Yes [provider]  Iron, Ferrous Sulfate, 325 (65 Fe) MG TABS Take 325 mg by mouth daily. 11/18/20  Yes Hoy Register, MD  metoprolol succinate (TOPROL XL) 25 MG 24 hr tablet Take 0.5 tablets (12.5 mg total) by mouth at bedtime. Patient taking differently: Take 12.5 mg by mouth daily. 01/29/21 01/29/22 Yes Laurey Morale, MD  omeprazole (PRILOSEC) 20 MG capsule Take 1 capsule (20 mg total) by mouth daily. 04/04/21  05/04/21 Yes Carroll Sage, PA-C  potassium chloride (KLOR-CON) 10 MEQ tablet TAKE 2 TABLETS DAILY. Patient taking differently: Take 20 mEq by mouth daily. 10/15/20  Yes Laurey Morale, MD  sertraline (ZOLOFT) 50 MG tablet Take 1 tablet (50 mg total) by mouth daily. 01/29/21  Yes Laurey Morale, MD  diclofenac Sodium (VOLTAREN) 1 % GEL Apply 4 g topically 4 (four) times daily. Patient not taking: Reported on 04/04/2021 02/23/21   Hoy Register, MD    Allergies    Fentanyl  Review of Systems   Review of Systems  Constitutional:  Negative for chills and fever.  HENT:  Negative for congestion.   Respiratory:  Negative for shortness of breath.   Cardiovascular:  Positive for chest pain. Negative for palpitations and leg swelling.  Gastrointestinal:  Negative for abdominal pain, diarrhea, nausea and vomiting.  Genitourinary:  Negative for enuresis.  Musculoskeletal:  Negative for back pain.  Skin:  Negative for rash.  Neurological:  Negative for dizziness.  Hematological:  Does not bruise/bleed easily.   Physical Exam Updated Vital Signs BP (!) 128/91   Pulse 85   Temp 98.1 F (36.7 C) (Oral)   Resp 13   Ht 5\' 9"  (1.753 m)   Wt 59 kg   SpO2 100%   BMI 19.20 kg/m   Physical Exam Vitals and nursing note reviewed.  Constitutional:      General: She is not in acute distress.    Appearance: She is not ill-appearing.  HENT:     Head: Normocephalic and atraumatic.     Nose: No congestion.  Eyes:     Conjunctiva/sclera: Conjunctivae normal.  Cardiovascular:     Rate and Rhythm: Normal rate and regular rhythm.     Pulses: Normal pulses.     Heart sounds: No murmur heard.   No friction rub. No gallop.  Pulmonary:     Effort: No respiratory distress.     Breath sounds: No wheezing, rhonchi or rales.  Chest:     Chest wall: No tenderness.  Abdominal:     Palpations: Abdomen is soft.     Tenderness: There is abdominal tenderness. There is no right CVA tenderness or left  CVA tenderness.     Comments: Abdomen nondistended, normal bowel sounds, dull to percussion, she does have tenderness to palpation epigastric region, no guarding, rebound tenderness, peritoneal sign, negative Murphy sign.  Musculoskeletal:     Right lower leg: No edema.     Left lower leg: No edema.  Skin:    General: Skin is warm and dry.  Neurological:     Mental Status: She  is alert.  Psychiatric:        Mood and Affect: Mood normal.    ED Results / Procedures / Treatments   Labs (all labs ordered are listed, but only abnormal results are displayed) Labs Reviewed  BASIC METABOLIC PANEL - Abnormal; Notable for the following components:      Result Value   Potassium 3.4 (*)    All other components within normal limits  CBC - Abnormal; Notable for the following components:   Hemoglobin 11.9 (*)    All other components within normal limits  POC URINE PREG, ED  TROPONIN I (HIGH SENSITIVITY)  TROPONIN I (HIGH SENSITIVITY)    EKG EKG Interpretation  Date/Time:  Saturday April 04 2021 16:22:11 EDT Ventricular Rate:  99 PR Interval:  181 QRS Duration: 88 QT Interval:  362 QTC Calculation: 465 R Axis:   65 Text Interpretation: Sinus rhythm Borderline T abnormalities, inferior leads Confirmed by Bethann Berkshire [phone]) on 04/04/2021 7:41:57 PM  Radiology DG Chest 2 View  Result Date: 04/04/2021 CLINICAL DATA:  Chest pain radiating down left arm EXAM: CHEST - 2 VIEW COMPARISON:  03/05/2021 FINDINGS: The heart size and mediastinal contours are within normal limits. Both lungs are clear. The visualized skeletal structures are unremarkable. IMPRESSION: No active cardiopulmonary disease. Electronically Signed   By: Sharlet Salina M.D.   On: 04/04/2021 16:49    Procedures Procedures   Medications Ordered in ED Medications  alum & mag hydroxide-simeth (MAALOX/MYLANTA) 200-200-20 MG/5ML suspension 30 mL (30 mLs Oral Given 04/04/21 1803)    And  lidocaine (XYLOCAINE) 2 % viscous  mouth solution 15 mL (15 mLs Oral Given 04/04/21 1803)  cyclobenzaprine (FLEXERIL) tablet 10 mg (10 mg Oral Given 04/04/21 1802)    ED Course  I have reviewed the triage vital signs and the nursing notes.  Pertinent labs & imaging results that were available during my care of the patient were reviewed by me and considered in my medical decision making (see chart for details).    MDM Rules/Calculators/A&P                          Initial impression-presents with left-sided chest pain.  She is alert, does not appear acute stress, vital signs are reassuring.  Unclear etiology possible GERD versus muscular strain versus underlying cardiac abnormality will obtain basic lab work provide patient with pain medications and reassess.  Work-up-CBC shows normocytic anemia he will 11.9 above her baseline, BMP shows hypokalemia 3.4, first troponin less than 2, second troponin less than 2.  EKG sinus rhythm without signs of ischemia, chest x-ray unremarkable.  Reassessment-patient  reassessed, states she is feeling much better  will continue to monitor.  Patient  reassessed updated lab and imaging, states she will has occasional intermittent chest pain that last approximately 1 minute  and then resolve, it is not pleuritic in nature she is not hypoxic, nontachypneic, she has no other complaints, vital signs remained stable she is agreeable for discharge.  Rule out- I have low suspicion for ACS as history is atypical, EKG was sinus rhythm without signs of ischemia, patient had a delta troponin.  Patient has a heart score of 2.  low suspicion for PE as patient denies pleuritic chest pain, shortness of breath, patient denies leg pain, no pedal edema noted on exam, patient was PERC negative.  Low suspicion for AAA or aortic dissection as history is atypical, patient has low risk factors.  Low suspicion for  systemic infection as patient is nontoxic-appearing, vital signs reassuring, no obvious source infection noted  on exam.   Plan-  Chest pain improved-unclear etiology likely this is GERD, but differential diagnosis includes costochondritis, muscular strain, angina I feel is unlikely due to presentation.  We will have her follow-up with cardiology gave her strict return precautions.  Vital signs have remained stable, no indication for hospital admission. Patient given at home care as well strict return precautions.  Patient verbalized that they understood agreed to said plan.  Final Clinical Impression(s) / ED Diagnoses Final diagnoses:  Atypical chest pain    Rx / DC Orders ED Discharge Orders          Ordered    omeprazole (PRILOSEC) 20 MG capsule  Daily        04/04/21 1951             Carroll Sage, PA-C 04/04/21 1953    Carroll Sage, PA-C 04/04/21 Angelia Mould, MD 04/04/21 2015

## 2021-04-04 NOTE — Discharge Instructions (Addendum)
Lab work imaging are reassuring.  After you back on your acid pill please take as prescribed.  Please follow-up your PCP for further evaluation.  Come back to the emergency department if you develop chest pain, shortness of breath, severe abdominal pain, uncontrolled nausea, vomiting, diarrhea.

## 2021-04-05 NOTE — Telephone Encounter (Signed)
Requested medication (s) are due for refill today: yes  Requested medication (s) are on the active medication list: yes  Last refill:  03/05/21  Future visit scheduled: yes  Notes to clinic:  Who is this pt's PCP?   Requested Prescriptions  Pending Prescriptions Disp Refills   megestrol (MEGACE) 20 MG tablet [Pharmacy Med Name: MEGESTROL 20MG  TABLET] 30 tablet 0    Sig: TAKE 1 TABLET BY MOUTH ONCE A DAY.     OB/GYN:  Progestins Passed - 04/04/2021 10:18 AM      Passed - Valid encounter within last 12 months    Recent Outpatient Visits           4 months ago Screening for diabetes mellitus   [facility] [place], [place], MD   6 months ago Chronic systolic CHF (congestive heart failure) (HCC)   [facility] [place], [place]       Future Appointments             In 4 days Hoy Register, PA-C [facility]             esomeprazole (NEXIUM) 40 MG capsule [Pharmacy Med Name: ESOMEPRAZOLE MAG DR 40 MG CAP] 30 capsule 0    Sig: TAKE 1 CAPSULE BY MOUTH EVERY MORNING BEFORE BREAKFAST     Gastroenterology: Proton Pump Inhibitors Passed - 04/04/2021 10:18 AM      Passed - Valid encounter within last 12 months    Recent Outpatient Visits           4 months ago Screening for diabetes mellitus   [facility] [place], [place], MD   6 months ago Chronic systolic CHF (congestive heart failure) [facility])   [facility], MD       Future Appointments             In 4 days [place], [place] [facility]

## 2021-04-08 NOTE — Progress Notes (Signed)
Patient ID: Tina Fletcher, female   DOB: [birth year], 35 y.o.   MRN: [id number]     Tina Fletcher, is a 35 y.o. female  XLK:[id number]  [id number]  DOB - Jan 18, 1986  Chief Complaint  Patient presents with   Follow-up       Subjective:   Tina Fletcher is a 35 y.o. female here today for a follow up visit After ED visit 04/04/2021.  She had also been to ED for CP on 03/05/2021.  She has a cardiologist and sees them next month.  Drug screen +opiates at ED.  She is requesting hydrocodone for chronic LBP today.  She has only been taking metoprolol succinate when she feels like she needs it and never more than once a day. She is prescribed K= daily.  Doing ok on zoloft.  No Alcohol since 07/2020.   She took metoprolol about before seeing me  From ED A/P: Initial impression-presents with left-sided chest pain.  She is alert, does not appear acute stress, vital signs are reassuring.  Unclear etiology possible GERD versus muscular strain versus underlying cardiac abnormality will obtain basic lab work provide patient with pain medications and reassess.   Work-up-CBC shows normocytic anemia he will 11.9 above her baseline, BMP shows hypokalemia 3.4, first troponin less than 2, second troponin less than 2.  EKG sinus rhythm without signs of ischemia, chest x-ray unremarkable.   Reassessment-patient  reassessed, states she is feeling much better  will continue to monitor.   Patient  reassessed updated lab and imaging, states she will has occasional intermittent chest pain that last approximately 1 minute  and then resolve, it is not pleuritic in nature she is not hypoxic, nontachypneic, she has no other complaints, vital signs remained stable she is agreeable for discharge.   Rule out- I have low suspicion for ACS as history is atypical, EKG was sinus rhythm without signs of ischemia, patient had a delta troponin.  Patient has a heart score of 2.  low suspicion for PE as  patient denies pleuritic chest pain, shortness of breath, patient denies leg pain, no pedal edema noted on exam, patient was PERC negative.  Low suspicion for AAA or aortic dissection as history is atypical, patient has low risk factors.  Low suspicion for systemic infection as patient is nontoxic-appearing, vital signs reassuring, no obvious source infection noted on exam.     Plan-   Chest pain improved-unclear etiology likely this is GERD, but differential diagnosis includes costochondritis, muscular strain, angina I feel is unlikely due to presentation.  We will have her follow-up with cardiology gave her strict return precautions.    . Patient has No headache, No chest pain, No abdominal pain - No Nausea, No new weakness tingling or numbness, No Cough - SOB.  No problems updated.  ALLERGIES: Allergies  Allergen Reactions   Fentanyl Other (See Comments)    Headaches    PAST MEDICAL HISTORY: 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 complication    Hypertension    Medical history non-contributory    Migraines    Nexplanon insertion 08/13/2013   08/13/13 inserted left arm, remove 08/13/16   Vitamin D deficiency 04/01/2016    MEDICATIONS AT HOME: Prior to Admission medications   Medication Sig Start Date End Date Taking? Authorizing Provider  etonogestrel (NEXPLANON) 68 MG IMPL implant Inject 1 each into the skin  continuous.   Yes [provider]  HYDROcodone-acetaminophen (NORCO/VICODIN) 5-325 MG tablet Take 1-2 tablets by mouth every 6 (six) hours as needed. 03/05/21  Yes Vanetta Mulders, MD  omeprazole (PRILOSEC) 20 MG capsule Take 1 capsule (20 mg total) by mouth daily. 04/04/21 05/04/21 Yes Carroll Sage, PA-C  diclofenac Sodium (VOLTAREN) 1 % GEL Apply 4 g topically 4 (four) times daily. Patient not taking: No sig reported 02/23/21   Hoy Register, MD   ibuprofen (ADVIL) 800 MG tablet Take 800 mg by mouth 3 (three) times daily as needed for fever or headache. Patient not taking: Reported on 04/09/2021 12/26/20   [provider]  Iron, Ferrous Sulfate, 325 (65 Fe) MG TABS Take 325 mg by mouth daily. 04/09/21   Anders Simmonds, PA-C  metoprolol succinate (TOPROL XL) 25 MG 24 hr tablet Take 0.5 tablets (12.5 mg total) by mouth at bedtime. 04/09/21 04/09/22  Anders Simmonds, PA-C  potassium chloride (KLOR-CON) 10 MEQ tablet Take 2 tablets (20 mEq total) by mouth daily. 04/09/21   Anders Simmonds, PA-C  sertraline (ZOLOFT) 50 MG tablet Take 1 tablet (50 mg total) by mouth daily. 04/09/21   Carlean Crowl, Marzella Schlein, PA-C    ROS: Neg HEENT Neg resp Neg cardiac Neg GI Neg GU Neg MS Neg psych Neg neuro  Objective:   Vitals:   04/09/21 1422  BP: (!) 139/97  Pulse: 100  SpO2: 100%  Weight: 124 lb (56.2 kg)  Height: 5\' 9"  (1.753 m)   Exam General appearance : Awake, alert, not in any distress. Speech Clear. Not toxic looking HEENT: Atraumatic and Normocephalic, pupils equally reactive to light and accomodation Neck: Supple, no JVD. No cervical lymphadenopathy.  Chest: Good air entry bilaterally, CTAB.  No rales/rhonchi/wheezing CVS: S1 S2 regular, no murmurs.  Abdomen: Bowel sounds present, Non tender and not distended with no gaurding, rigidity or rebound. Extremities: B/L Lower Ext shows no edema, both legs are warm to touch Neurology: Awake alert, and oriented X 3, CN II-XII intact, Non focal Skin: No Rash  Data Review Lab Results  Component Value Date   HGBA1C 5.6 11/18/2020   HGBA1C 5.4 05/11/2019   HGBA1C 5.0 03/19/2016    Assessment & Plan   1. Anemia, unspecified type - CBC with Differential/Platelet - Iron, Ferrous Sulfate, 325 (65 Fe) MG TABS; Take 325 mg by mouth daily.  Dispense: 60 tablet; Refill: 3  2. Chronic systolic CHF (congestive heart failure) (HCC) Followed by cardiology - Basic metabolic  panel - metoprolol succinate (TOPROL XL) 25 MG 24 hr tablet; Take 0.5 tablets (12.5 mg total) by mouth at bedtime.  Dispense: 45 tablet; Refill: 3  3. Chronic midline low back pain without sciatica We do not prescribe hydrocodone here which is what she is requesting.   - Ambulatory referral to Pain Clinic  4. Hypokalemia - potassium chloride (KLOR-CON) 10 MEQ tablet; Take 2 tablets (20 mEq total) by mouth daily.  Dispense: 60 tablet; Refill: 2  5. Hypertension, unspecified type Follow with cardiology-she took her metoprolol 10 mins before seeing me and did not take any last night - Basic metabolic panel - metoprolol succinate (TOPROL XL) 25 MG 24 hr tablet; Take 0.5 tablets (12.5 mg total) by mouth at bedtime.  Dispense: 45 tablet; Refill: 3  6. Anxiety and depression - sertraline (ZOLOFT) 50 MG tablet; Take 1 tablet (50 mg total) by mouth daily.  Dispense: 30 tablet; Refill: 5  7. Encounter for examination following treatment at hospital  Patient have been counseled extensively about nutrition and exercise. Other issues discussed during this visit include: low cholesterol diet, weight control and daily exercise, foot care, annual eye examinations at Ophthalmology, importance of adherence with medications and regular follow-up. We also discussed long term complications of uncontrolled diabetes and hypertension.   Return in about 3 months (around 07/10/2021) for PLEASE ASSIGN PCP and make appt..  The patient was given clear instructions to go to ER or return to medical center if symptoms don't improve, worsen or new problems develop. The patient verbalized understanding. The patient was told to call to get lab results if they haven't heard anything in the next week.      Georgian Co, PA-C [facility] [place], [place] [phone]   04/09/2021, 2:42 PM

## 2021-04-09 ENCOUNTER — Encounter: Payer: Self-pay | Admitting: Physician Assistant

## 2021-04-09 ENCOUNTER — Other Ambulatory Visit: Payer: Self-pay

## 2021-04-09 ENCOUNTER — Ambulatory Visit: Payer: Medicaid Other | Attending: Physician Assistant | Admitting: Physician Assistant

## 2021-04-09 VITALS — BP 139/97 | HR 100 | Ht 69.0 in | Wt 124.0 lb

## 2021-04-09 DIAGNOSIS — D649 Anemia, unspecified: Secondary | ICD-10-CM

## 2021-04-09 DIAGNOSIS — F419 Anxiety disorder, unspecified: Secondary | ICD-10-CM

## 2021-04-09 DIAGNOSIS — Z09 Encounter for follow-up examination after completed treatment for conditions other than malignant neoplasm: Secondary | ICD-10-CM

## 2021-04-09 DIAGNOSIS — I110 Hypertensive heart disease with heart failure: Secondary | ICD-10-CM | POA: Insufficient documentation

## 2021-04-09 DIAGNOSIS — E876 Hypokalemia: Secondary | ICD-10-CM

## 2021-04-09 DIAGNOSIS — Z7182 Exercise counseling: Secondary | ICD-10-CM | POA: Diagnosis not present

## 2021-04-09 DIAGNOSIS — G8929 Other chronic pain: Secondary | ICD-10-CM

## 2021-04-09 DIAGNOSIS — Z79899 Other long term (current) drug therapy: Secondary | ICD-10-CM | POA: Diagnosis not present

## 2021-04-09 DIAGNOSIS — Z7901 Long term (current) use of anticoagulants: Secondary | ICD-10-CM | POA: Diagnosis not present

## 2021-04-09 DIAGNOSIS — M545 Low back pain, unspecified: Secondary | ICD-10-CM | POA: Diagnosis not present

## 2021-04-09 DIAGNOSIS — I10 Essential (primary) hypertension: Secondary | ICD-10-CM

## 2021-04-09 DIAGNOSIS — R0789 Other chest pain: Secondary | ICD-10-CM | POA: Diagnosis present

## 2021-04-09 DIAGNOSIS — F32A Depression, unspecified: Secondary | ICD-10-CM | POA: Diagnosis not present

## 2021-04-09 DIAGNOSIS — I5022 Chronic systolic (congestive) heart failure: Secondary | ICD-10-CM

## 2021-04-09 MED ORDER — SERTRALINE HCL 50 MG PO TABS: 50.0000 mg | ORAL_TABLET | Freq: Every day | ORAL | 5 refills | Status: DC

## 2021-04-09 MED ORDER — IRON (FERROUS SULFATE) 325 (65 FE) MG PO TABS: 325.0000 mg | ORAL_TABLET | Freq: Every day | ORAL | 3 refills | Status: AC

## 2021-04-09 MED ORDER — METOPROLOL SUCCINATE ER 25 MG PO TB24: 12.5000 mg | ORAL_TABLET | Freq: Every day | ORAL | 3 refills | Status: AC

## 2021-04-09 MED ORDER — POTASSIUM CHLORIDE ER 10 MEQ PO TBCR: 20.0000 meq | EXTENDED_RELEASE_TABLET | Freq: Every day | ORAL | 2 refills | Status: DC

## 2021-04-10 LAB — CBC WITH DIFFERENTIAL/PLATELET
Basophils Absolute: 0 10*3/uL (ref 0.0–0.2)
Basos: 1 %
EOS (ABSOLUTE): 0.1 10*3/uL (ref 0.0–0.4)
Eos: 2 %
Hematocrit: 32.7 % — ABNORMAL LOW (ref 34.0–46.6)
Hemoglobin: 10.7 g/dL — ABNORMAL LOW (ref 11.1–15.9)
Immature Grans (Abs): 0 10*3/uL (ref 0.0–0.1)
Immature Granulocytes: 1 %
Lymphocytes Absolute: 1.9 10*3/uL (ref 0.7–3.1)
Lymphs: 43 %
MCH: 27.7 pg (ref 26.6–33.0)
MCHC: 32.7 g/dL (ref 31.5–35.7)
MCV: 85 fL (ref 79–97)
Monocytes Absolute: 0.4 10*3/uL (ref 0.1–0.9)
Monocytes: 9 %
Neutrophils Absolute: 2 10*3/uL (ref 1.4–7.0)
Neutrophils: 44 %
Platelets: 197 10*3/uL (ref 150–450)
RBC: 3.86 x10E6/uL (ref 3.77–5.28)
RDW: 12.5 % (ref 11.7–15.4)
WBC: 4.4 10*3/uL (ref 3.4–10.8)

## 2021-04-10 LAB — BASIC METABOLIC PANEL
BUN/Creatinine Ratio: 15 (ref 9–23)
BUN: 13 mg/dL (ref 6–20)
CO2: 21 mmol/L (ref 20–29)
Calcium: 9.2 mg/dL (ref 8.7–10.2)
Chloride: 106 mmol/L (ref 96–106)
Creatinine, Ser: 0.89 mg/dL (ref 0.57–1.00)
Glucose: 74 mg/dL (ref 70–99)
Potassium: 4 mmol/L (ref 3.5–5.2)
Sodium: 140 mmol/L (ref 134–144)
eGFR: 88 mL/min/{1.73_m2} (ref >59)

## 2021-04-17 ENCOUNTER — Encounter (HOSPITAL_COMMUNITY): Payer: Self-pay

## 2021-04-17 ENCOUNTER — Other Ambulatory Visit: Payer: Self-pay

## 2021-04-17 ENCOUNTER — Emergency Department (HOSPITAL_COMMUNITY)
Admission: EM | Admit: 2021-04-17 | Discharge: 2021-04-18 | Disposition: A | Discharge status: Home / Self Care | Payer: Medicaid Other | Attending: Emergency Medicine | Admitting: Emergency Medicine

## 2021-04-17 ENCOUNTER — Emergency Department (HOSPITAL_COMMUNITY): Payer: Medicaid Other

## 2021-04-17 DIAGNOSIS — I110 Hypertensive heart disease with heart failure: Secondary | ICD-10-CM | POA: Insufficient documentation

## 2021-04-17 DIAGNOSIS — Z79899 Other long term (current) drug therapy: Secondary | ICD-10-CM | POA: Diagnosis not present

## 2021-04-17 DIAGNOSIS — M549 Dorsalgia, unspecified: Secondary | ICD-10-CM | POA: Diagnosis not present

## 2021-04-17 DIAGNOSIS — T50901A Poisoning by unspecified drugs, medicaments and biological substances, accidental (unintentional), initial encounter: Secondary | ICD-10-CM | POA: Diagnosis present

## 2021-04-17 DIAGNOSIS — R0789 Other chest pain: Secondary | ICD-10-CM | POA: Diagnosis not present

## 2021-04-17 DIAGNOSIS — Z955 Presence of coronary angioplasty implant and graft: Secondary | ICD-10-CM | POA: Diagnosis not present

## 2021-04-17 DIAGNOSIS — R072 Precordial pain: Secondary | ICD-10-CM

## 2021-04-17 DIAGNOSIS — I5022 Chronic systolic (congestive) heart failure: Secondary | ICD-10-CM | POA: Insufficient documentation

## 2021-04-17 DIAGNOSIS — X58XXXA Exposure to other specified factors, initial encounter: Secondary | ICD-10-CM | POA: Diagnosis not present

## 2021-04-17 DIAGNOSIS — T6591XA Toxic effect of unspecified substance, accidental (unintentional), initial encounter: Secondary | ICD-10-CM

## 2021-04-17 DIAGNOSIS — R000 Tachycardia, unspecified: Secondary | ICD-10-CM | POA: Insufficient documentation

## 2021-04-17 LAB — DIFFERENTIAL
Abs Immature Granulocytes: 0.01 10*3/uL (ref 0.00–0.07)
Basophils Absolute: 0 10*3/uL (ref 0.0–0.1)
Basophils Relative: 1 %
Eosinophils Absolute: 0.1 10*3/uL (ref 0.0–0.5)
Eosinophils Relative: 2 %
Immature Granulocytes: 0 %
Lymphocytes Relative: 36 %
Lymphs Abs: 2 10*3/uL (ref 0.7–4.0)
Monocytes Absolute: 0.3 10*3/uL (ref 0.1–1.0)
Monocytes Relative: 6 %
Neutro Abs: 3.1 10*3/uL (ref 1.7–7.7)
Neutrophils Relative %: 55 %

## 2021-04-17 LAB — BASIC METABOLIC PANEL
Anion gap: 9 (ref 5–15)
BUN: 16 mg/dL (ref 6–20)
CO2: 25 mmol/L (ref 22–32)
Calcium: 9.7 mg/dL (ref 8.9–10.3)
Chloride: 103 mmol/L (ref 98–111)
Creatinine, Ser: 0.94 mg/dL (ref 0.44–1.00)
GFR, Estimated: >60 mL/min (ref >60)
Glucose, Bld: 109 mg/dL — ABNORMAL HIGH (ref 70–99)
Potassium: 3.5 mmol/L (ref 3.5–5.1)
Sodium: 137 mmol/L (ref 135–145)

## 2021-04-17 LAB — CBC
HCT: 42.1 % (ref 36.0–46.0)
Hemoglobin: 13.4 g/dL (ref 12.0–15.0)
MCH: 27.8 pg (ref 26.0–34.0)
MCHC: 31.8 g/dL (ref 30.0–36.0)
MCV: 87.3 fL (ref 80.0–100.0)
Platelets: 237 10*3/uL (ref 150–400)
RBC: 4.82 MIL/uL (ref 3.87–5.11)
RDW: 12.9 % (ref 11.5–15.5)
WBC: 5.6 10*3/uL (ref 4.0–10.5)
nRBC: 0 % (ref 0.0–0.2)

## 2021-04-17 LAB — D-DIMER, QUANTITATIVE: D-Dimer, Quant: 0.55 ug/mL-FEU — ABNORMAL HIGH (ref 0.00–0.50)

## 2021-04-17 LAB — TROPONIN I (HIGH SENSITIVITY): Troponin I (High Sensitivity): <2 ng/L (ref <18)

## 2021-04-17 MED ORDER — SODIUM CHLORIDE 0.9 % IV BOLUS
1000.0000 mL | Freq: Once | INTRAVENOUS | Status: AC
Start: 2021-04-17 — End: 2021-04-17
  Administered 2021-04-17: 1000 mL via INTRAVENOUS

## 2021-04-17 MED ORDER — IOHEXOL 350 MG/ML SOLN
100.0000 mL | Freq: Once | INTRAVENOUS | Status: AC | PRN
  Administered 2021-04-17: 75 mL via INTRAVENOUS

## 2021-04-17 NOTE — ED Triage Notes (Signed)
Pt arrived via POV c/o intermittent chest pain and back pain. Pt reports taking 4 Hydrocodones PTA and feels like she is sick now maybe took too many. Pt reports she took the pain medicine to get relief from her back pain. Pt did not intentionally try to overdose or harm herself she reports.

## 2021-04-17 NOTE — ED Notes (Signed)
Switched boxes out when pt came back from X-ray and cardiac monitor is now working.

## 2021-04-17 NOTE — ED Notes (Signed)
Cardiac monitor is broke in pts room. Nurse notified. Will plug up to EKG machine when pt returns from x-ray.

## 2021-04-17 NOTE — ED Provider Notes (Signed)
[facility] EMERGENCY DEPARTMENT Provider Note   CSN: [id number] Arrival date & time: 04/17/21  2053     History Chief Complaint  Patient presents with   Ingestion    Tina Fletcher is a 35 y.o. female.   Ingestion Associated symptoms include chest pain. Pertinent negatives include no abdominal pain, no headaches and no shortness of breath.   Patient with history of CHF, atypical chest pain, migraines presents with accidental ingestion.  Patient reports she has been having back pain in the last few weeks, she was recently given a prescription for pain killing medicine.  She mistakenly took 4 earlier this afternoon roughly an hour or 2 ago, she reports she vomited immediately afterwards.  She is also been having chest pain intermittently, reports this is been the pain she has been experiencing for the last 8 months.  Denies any changes in the pain severity, location, duration, character.  She reports the back pain is also been constant and exacerbated by movement.  Unable to identify any alleviating factors.  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 complication    Hypertension    Medical history non-contributory    Migraines    Nexplanon insertion 08/13/2013   08/13/13 inserted left arm, remove 08/13/16   Vitamin D deficiency 04/01/2016    Patient Active Problem List   Diagnosis Date Noted   Hypertensive crisis 08/18/2020   Hypokalemia 08/18/2020   Prolonged QT interval 08/18/2020   Syncope    Chronic systolic CHF (congestive heart failure) (HCC)    Cervical cancer screening 10/23/2019   Systolic CHF, acute (HCC) 05/10/2019   Vitamin D deficiency 04/01/2016   Nexplanon insertion 08/13/2013   Thrombocytopenia (HCC) 04/17/2013   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   Underweight 01/08/2013    Past Surgical History:  Procedure Laterality Date   RIGHT/LEFT HEART  CATH AND CORONARY ANGIOGRAPHY N/A 05/14/2019   Procedure: RIGHT/LEFT HEART CATH AND CORONARY ANGIOGRAPHY;  Surgeon: Laurey Morale, MD;  Location: [facility] INVASIVE CV LAB;  Service: Cardiovascular;  Laterality: N/A;     OB History     Gravida  2   Para  2   Term  2   Preterm      AB      Living  2      SAB      IAB      Ectopic      Multiple      Live Births  2           Family History  Problem Relation Age of Onset   Diabetes Maternal Grandmother    Cancer Paternal Grandfather        prancreatic   COPD Maternal Grandfather    Cancer Maternal Grandfather        prostate   Asthma Maternal Grandfather    Asthma Son    Bronchitis Son     Social History   Tobacco Use   Smoking status: Never   Smokeless tobacco: Never  Vaping Use   Vaping Use: Never used  Substance Use Topics   Alcohol use: Not Currently   Drug use: No    Home Medications Prior to Admission medications   Medication Sig Start Date End Date Taking? Authorizing Provider  diclofenac Sodium (VOLTAREN) 1 % GEL Apply 4 g topically 4 (four) times daily. Patient not taking: No sig reported 02/23/21  Hoy Register, MD  etonogestrel (NEXPLANON) 68 MG IMPL implant Inject 1 each into the skin continuous.    [provider]  HYDROcodone-acetaminophen (NORCO/VICODIN) 5-325 MG tablet Take 1-2 tablets by mouth every 6 (six) hours as needed. 03/05/21   Vanetta Mulders, MD  ibuprofen (ADVIL) 800 MG tablet Take 800 mg by mouth 3 (three) times daily as needed for fever or headache. Patient not taking: Reported on 04/09/2021 12/26/20   [provider]  Iron, Ferrous Sulfate, 325 (65 Fe) MG TABS Take 325 mg by mouth daily. 04/09/21   Anders Simmonds, PA-C  metoprolol succinate (TOPROL XL) 25 MG 24 hr tablet Take 0.5 tablets (12.5 mg total) by mouth at bedtime. 04/09/21 04/09/22  Anders Simmonds, PA-C  omeprazole (PRILOSEC) 20 MG capsule Take 1 capsule (20 mg total) by mouth daily. 04/04/21  05/04/21  Carroll Danaysha Kirn, PA-C  potassium chloride (KLOR-CON) 10 MEQ tablet Take 2 tablets (20 mEq total) by mouth daily. 04/09/21   Anders Simmonds, PA-C  sertraline (ZOLOFT) 50 MG tablet Take 1 tablet (50 mg total) by mouth daily. 04/09/21   Anders Simmonds, PA-C    Allergies    Fentanyl  Review of Systems   Review of Systems  Constitutional:  Negative for fatigue and fever.  Respiratory:  Negative for shortness of breath.   Cardiovascular:  Positive for chest pain. Negative for leg swelling.  Gastrointestinal:  Positive for nausea and vomiting. Negative for abdominal pain.  Genitourinary:  Negative for dysuria.  Musculoskeletal:  Positive for back pain.  Neurological:  Negative for syncope, weakness and headaches.   Physical Exam Updated Vital Signs Ht 5\' 9"  (1.753 m)   Wt 56.2 kg   BMI 18.30 kg/m   Physical Exam Vitals and nursing note reviewed. Exam conducted with a chaperone present.  Constitutional:      Appearance: Normal appearance.     Comments: Patient is awake and oriented.  HENT:     Head: Normocephalic and atraumatic.  Eyes:     General: No scleral icterus.       Right eye: No discharge.        Left eye: No discharge.     Extraocular Movements: Extraocular movements intact.     Pupils: Pupils are equal, round, and reactive to light.  Cardiovascular:     Rate and Rhythm: Regular rhythm. Tachycardia present.     Pulses: Normal pulses.     Heart sounds: Normal heart sounds. No murmur heard.   No friction rub. No gallop.     Comments: DP, PT, radial pulses are 2+ bilaterally Pulmonary:     Effort: Pulmonary effort is normal. No respiratory distress.     Breath sounds: Normal breath sounds.  Abdominal:     General: Abdomen is flat. Bowel sounds are normal. There is no distension.     Palpations: Abdomen is soft.     Tenderness: There is no abdominal tenderness.  Musculoskeletal:     Comments: No edema to the lower extremities  Skin:    General:  Skin is warm and dry.     Coloration: Skin is not jaundiced.  Neurological:     Mental Status: She is alert. Mental status is at baseline.     Coordination: Coordination normal.   ED Results / Procedures / Treatments   Labs (all labs ordered are listed, but only abnormal results are displayed) Labs Reviewed  BASIC METABOLIC PANEL  CBC  DIFFERENTIAL  TROPONIN I (HIGH SENSITIVITY)  EKG None  Radiology No results found.  Procedures Procedures   Medications Ordered in ED Medications - No data to display  ED Course  I have reviewed the triage vital signs and the nursing notes.  Pertinent labs & imaging results that were available during my care of the patient were reviewed by me and considered in my medical decision making (see chart for details).    MDM Rules/Calculators/A&P                           Patient is mildly tachycardic, vitals are stable.  Unable to [facility] out patient, she is having chest pain or shortness of breath so we will check a dimer.  Overall, low suspicion given she has been having intermittent chest pain for 8 months.  We will check a BNP given she has a documented history of CHF.  She is primarily here due to ingestion, given she vomited it up and only ingested 4 pills.   Patient is tearful on examination.  Heart rate is still mildly elevated though not technically tachycardic.  The dimer was slightly elevated, given this in the setting of chest pain and shortness of breath and back pain we will proceed with CTA to rule out PE.  This is pending at discharge, discussed the patient's case with Dr. Bebe Shaggy who is taking over patient care.  He is aware the plan, assuming the PE study is negative she can be discharged home.  Discussed HPI, physical exam and plan of care for this patient with attending Coralee Pesa. The attending physician evaluated this patient as part of a shared visit and agrees with plan of care.   Final Clinical Impression(s) / ED  Diagnoses Final diagnoses:  None    Rx / DC Orders ED Discharge Orders     None        Theron Arista, PA-C 04/17/21 2314    Rozelle Logan, DO 04/17/21 2325

## 2021-04-18 LAB — HEPATIC FUNCTION PANEL
ALT: 20 U/L (ref 0–44)
AST: 17 U/L (ref 15–41)
Albumin: 4.4 g/dL (ref 3.5–5.0)
Alkaline Phosphatase: 48 U/L (ref 38–126)
Bilirubin, Direct: 0.1 mg/dL (ref 0.0–0.2)
Indirect Bilirubin: 0.7 mg/dL (ref 0.3–0.9)
Total Bilirubin: 0.8 mg/dL (ref 0.3–1.2)
Total Protein: 7.1 g/dL (ref 6.5–8.1)

## 2021-04-18 LAB — SALICYLATE LEVEL: Salicylate Lvl: <7 mg/dL — ABNORMAL LOW (ref 7.0–30.0)

## 2021-04-18 LAB — TROPONIN I (HIGH SENSITIVITY): Troponin I (High Sensitivity): <2 ng/L (ref <18)

## 2021-04-18 LAB — ETHANOL: Alcohol, Ethyl (B): <10 mg/dL (ref <10)

## 2021-04-18 LAB — ACETAMINOPHEN LEVEL: Acetaminophen (Tylenol), Serum: <10 ug/mL — ABNORMAL LOW (ref 10–30)

## 2021-04-18 MED ORDER — PROCHLORPERAZINE EDISYLATE 10 MG/2ML IJ SOLN
10.0000 mg | Freq: Once | INTRAMUSCULAR | Status: AC
  Administered 2021-04-18: 10 mg via INTRAVENOUS
  Filled 2021-04-18: qty 2

## 2021-04-18 NOTE — ED Provider Notes (Signed)
CT chest negative.  Labs overall reassuring.  Patient denies SI.  She will be discharged home.   Tina Rhine, MD 04/18/21 [phone]

## 2021-04-18 NOTE — ED Notes (Signed)
ED Provider at bedside. 

## 2021-04-21 NOTE — Telephone Encounter (Signed)
Pt called in about medication megestrol (MEGACE) 20 MG tablet  and wanted to know why it was refused, please advise.

## 2021-04-22 NOTE — Telephone Encounter (Signed)
Pt requesting refill, last saw Marylene Land.

## 2021-04-22 NOTE — Telephone Encounter (Signed)
Please see my notes from 11/18/20. Megace was prescribed for a short course after which it was to be discontinued especially due to her CHF and I made her aware of this. If she has further questions they can be addressed at an OV with me. Thanks

## 2021-04-23 ENCOUNTER — Encounter (HOSPITAL_COMMUNITY): Payer: Self-pay

## 2021-04-23 NOTE — Telephone Encounter (Signed)
Pt was called and informed that her medication was denied due to her CHF

## 2021-04-30 NOTE — Progress Notes (Signed)
Advanced Heart Failure Clinic Note   PCP: Pcp, No PCP-Cardiologist: Dr. Shirlee Latch    HPI: 35 y.o. female with past history of HTN presented to [facility] on 05/10/19 with cough, SOB, LE swelling, orthopnea, bendopnea and 20lb weight gain over 3 months. She had a history of increased alcohol use over the several months prior with 7-8 airplane bottles of liquor 3-4 days per week. BNP was elevated and CTA showed pulmonary edema. Echo showed new onset systolic HF with EF 15% and decreased RV systolic function. Cardiology was consulted and she was transferred to [facility].   While admitted she was diuresed with IV lasix. Right and Left heart cath were done. LHC showed no significant coronary artery disease and RHC was within normal limits. Her cardiomyopathy is possibly secondary to recent increased alcohol use or uncontrolled HTN. Viral myocarditis was felt to be a possible etiology with recent cough, however cMRI showed no myocardial LGE, so no definitive evidence for prior MI, infiltrative disease, or myocarditis.  Also of note, she has no family history of cardiomyopathy, negative HIV, and TSH was normal.   Echo in 3/21 showed EF 30-35%, global hypokinesis, mild RV dilation with normal function, mild MR.   On 06/06/20, she had a CBD gummy with a high dosage of CBD in it.  Shortly afterwards, she became lightheaded and dizzy and actually passed out.  She had been out of her meds for several days and her BP was elevated when she went to the ER.  HS-TnI and D dimer were normal. She was discharged home.   Echo in 1/22 showed EF up to 40-45%. She wore a Zio patch in 1/22 with no significant events but only kept it on for about 4 days.   In 2/22, she was admitted with hypertensive urgency with syncope.  BP 218/110.  She had not been taking her evening BP med doses.  BP was controlled and she was discharged.  Later in 2/22, she went to the ER with chest pain.  Troponin was negative but BP was high.  She was sent  home.   Evaluated in clinic, 08/22/20 and was hypertensive. Also very anxious. EKG showed NSR w/ mildly prolonged QTc 474 ms. Labs reviewed and showed hypokalemia. KCl 20 daily was added. Amlodipine 5 mg daily also added. I also started sertraline 25 mg daily for marked anxiety.   Had return clinic f/u on 3/1. Just prior to that had been seen in the ED on 2/27 for continued anxiety and palpitations. She reported the ED physician gave her something for possible withdrawals, which had helped significantly. She had quit drinking 8 days prior.  BMP was checked on 2/27 and hypokalemia had resolved, K 3.5 (recently as low at 2.9). SCr ok at 0.79. She continued w/ frequent episodes of low BP at home and dizziness as well as fatigue. Coreg was reduced to 3.125 bid and amlodipine discontinued.   She was evaluated 11 times at different EDs for chest pain.  No cardiac findings. Felt to be due to anxiety.   Returned 10/28/20 for HF follow up. She was taking metoprolol once a day due to soft BP. Volume and symptoms stable.  Evaluated in ED 11/19/20, 12/02/20, and 12/15/20 for CP. Cardiac work up unremarkable, EKGs reassuring. Felt to be due to anxiety and possibly GERD.  3 more ED visits for CP. No cardiac findings.  Today she returns for HF follow up. Overall feeling fine, still feels anxious. Occasional palpitations. Denies increasing SOB, CP, dizziness,  edema, or PND/Orthopnea. Appetite ok. No fever or chills. Weight at home 120 pounds. Taking all medications. Remains abstinent from ETOH.  Labs (11/20): K 3.8, creatinine 1.1 Labs (2/21): K 3.6, creatinine 0.89 Labs (12/21): BNP 145, K 3.7, creatinine 0.67, hgb 12.2 Labs (2/22): K 3.4, creatinine 0.79, hgb 11.5 Labs (2/22): K 3.5, Creatinine 0.79 Labs (3/22): K 3.8, Creatinine 0.76 Labs (5/22): K 3.2, creatinine 1.01 Labs (6/22): K 3.4 => 4.1, creatinine 0.85 => 0.82 Labs (10/22): K 3.5, creatinine 0.94  PMH: 1. HTN - Renal artery dopplers (3/21): No  evidence for renal artery stenosis.  2. ETOH abuse: Has quit.  3. Chronic systolic CHF: Nonischemic cardiomyopathy.  - LHC/RHC (11/20): No significant CAD; mean RA 1, PA 24/8, mean PCWP 5, CI 3.54.  - Echo (11/20): EF 15%, diffuse hypokinesis, mildly decreased RV systolic function.  - CMRI (11/20): LV mildly dilated with EF 16%, normal RV size with EF 28%, no myocardial LGE.  - Echo (3/21): EF 30-35%, global hypokinesis, mild RV dilation with normal function, mild MR.  - Echo (1/22): EF 40-45%, normal RV.  4. COVID-19 infection 9/21 5. Prolonged QT interval - Zio patch 3/22: No significant abnormalities.   Review of Systems: All systems reviewed and negative except as per HPI.   Current Outpatient Medications  Medication Sig Dispense Refill   diclofenac Sodium (VOLTAREN) 1 % GEL Apply 4 g topically 4 (four) times daily. 100 g 1   etonogestrel (NEXPLANON) 68 MG IMPL implant Inject 1 each into the skin continuous.     HYDROcodone-acetaminophen (NORCO/VICODIN) 5-325 MG tablet Take 1-2 tablets by mouth every 6 (six) hours as needed. 12 tablet 0   Iron, Ferrous Sulfate, 325 (65 Fe) MG TABS Take 325 mg by mouth daily. 60 tablet 3   metoprolol succinate (TOPROL XL) 25 MG 24 hr tablet Take 0.5 tablets (12.5 mg total) by mouth at bedtime. 45 tablet 3   omeprazole (PRILOSEC) 20 MG capsule Take 1 capsule (20 mg total) by mouth daily. 30 capsule 0   potassium chloride (KLOR-CON) 10 MEQ tablet Take 2 tablets (20 mEq total) by mouth daily. 60 tablet 2   sertraline (ZOLOFT) 50 MG tablet Take 1 tablet (50 mg total) by mouth daily. 30 tablet 5   No current facility-administered medications for this encounter.   Allergies  Allergen Reactions   Fentanyl Other (See Comments)    Headaches   Social History   Socioeconomic History   Marital status: Single    Spouse name: Not on file   Number of children: Not on file   Years of education: Not on file   Highest education level: Not on file   Occupational History   Not on file  Tobacco Use   Smoking status: Never   Smokeless tobacco: Never  Vaping Use   Vaping Use: Never used  Substance and Sexual Activity   Alcohol use: Not Currently   Drug use: No   Sexual activity: Yes    Birth control/protection: Implant  Other Topics Concern   Not on file  Social History Narrative   Not on file   Social Determinants of Health   Financial Resource Strain: Low Risk    Difficulty of Paying Living Expenses: Not hard at all  Food Insecurity: No Food Insecurity   Worried About Programme researcher, broadcasting/film/video in the Last Year: Never true   Ran Out of Food in the Last Year: Never true  Transportation Needs: No Transportation Needs   Lack of Transportation (  Medical): No   Lack of Transportation (Non-Medical): No  Physical Activity: Inactive   Days of Exercise per Week: 0 days   Minutes of Exercise per Session: 0 min  Stress: Not on file  Social Connections: Not on file  Intimate Partner Violence: Not on file   Family History  Problem Relation Age of Onset   Diabetes Maternal Grandmother    Cancer Paternal Grandfather        prancreatic   COPD Maternal Grandfather    Cancer Maternal Grandfather        prostate   Asthma Maternal Grandfather    Asthma Son    Bronchitis Son    BP 106/78   Pulse 86   Wt 55.4 kg   SpO2 100%   BMI 18.05 kg/m   Wt Readings from Last 3 Encounters:  05/04/21 55.4 kg  04/17/21 56.2 kg  04/09/21 56.2 kg   PHYSICAL EXAM: General:  NAD. No resp difficulty, thin HEENT: Normal Neck: Supple. No JVD. Carotids 2+ bilat; no bruits. No lymphadenopathy or thryomegaly appreciated. Cor: PMI nondisplaced. Regular rate & rhythm. No rubs, gallops or murmurs. Lungs: Clear Abdomen: Soft, nontender, nondistended. No hepatosplenomegaly. No bruits or masses. Good bowel sounds. Extremities: No cyanosis, clubbing, rash, edema Neuro: Alert & oriented x 3, cranial nerves grossly intact. Moves all 4 extremities w/o  difficulty. Affect pleasant.  ASSESSMENT & PLAN:  1. Chronic systolic CHF: Cardiomyopathy of uncertain etiology.  No FH cardiomyopathy, negative HIV and normal TSH, no recent pregnancy.  She had been drinking heavily for several months prior to diagnosis, ETOH may contribute to her cardiomyopathy.  Uncontrolled HTN likely plays a role. LHC 04/2019 w/ no CAD, filling pressures optimized and preserved cardiac output. Cardiac MRI showed no myocardial LGE, so no definitive evidence for prior MI, infiltrative disease, or myocarditis. Echo in 3/21 showed EF 30-35%, echo in 1/22 with EF up to 40-45%, normal RV.  She has not been drinking for several months.  NYHA I-II, she is not volume overloaded on exam. - Continue Toprol XL 12.5 mg qhs.  - No BP room for Entresto, spironolactone.  - Narrow QRS 90 ms, not CRT candidate. She is out of ICD range with EF 40-45% on last echo.   2. HTN: BP now running low, suspect lower now that she is no longer drinking ETOH.  - No evidence for fibromuscular dysplasia on renal artery dopplers.  3. ETOH abuse: She reports she has quit ETOH since 08/16/20. - Congratulated on abstinence. 4. 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 QT/QTc 358/464 ms from ECG on 04/20/21. - CMET from 04/18/21 reviewed and OK. 5. Anxiety: Marked generalized anxiety.  Feels better on sertraline but still anxious. - Increase sertraline to 75 mg daily.  Follow QTc. - Follow up with PCP.  6. Palpitations, Syncope/ Orthostatic Hypotension: ? POTS in this young female. Saw Dr Graciela Husbands and he felt this was anxiety.   Zio patch in 3/22 with no arrhythmias.  - Continue Toprol XL.   Follow up in 3 months with Dr. Shirlee Latch + echo.   Anderson Malta Stanton, FNP 05/04/21

## 2021-05-01 ENCOUNTER — Telehealth (HOSPITAL_COMMUNITY): Payer: Self-pay

## 2021-05-01 NOTE — Telephone Encounter (Signed)
Called to confirm/remind patient of their appointment at the [facility] Clinic on 05/04/21.   Patient reminded to bring all medications and/or complete list.  Confirmed patient has transportation. Gave directions, instructed to utilize valet parking.  Confirmed appointment prior to ending call.

## 2021-05-04 ENCOUNTER — Ambulatory Visit (HOSPITAL_COMMUNITY)
Admission: RE | Admit: 2021-05-04 | Discharge: 2021-05-04 | Disposition: A | Discharge status: Home / Self Care | Payer: Medicaid Other | Source: Ambulatory Visit | Attending: Family Medicine | Admitting: Family Medicine

## 2021-05-04 ENCOUNTER — Other Ambulatory Visit: Payer: Self-pay

## 2021-05-04 ENCOUNTER — Encounter (HOSPITAL_COMMUNITY): Payer: Self-pay

## 2021-05-04 VITALS — BP 106/78 | HR 86 | Wt 122.2 lb

## 2021-05-04 DIAGNOSIS — I10 Essential (primary) hypertension: Secondary | ICD-10-CM

## 2021-05-04 DIAGNOSIS — F411 Generalized anxiety disorder: Secondary | ICD-10-CM | POA: Insufficient documentation

## 2021-05-04 DIAGNOSIS — F419 Anxiety disorder, unspecified: Secondary | ICD-10-CM

## 2021-05-04 DIAGNOSIS — R55 Syncope and collapse: Secondary | ICD-10-CM | POA: Insufficient documentation

## 2021-05-04 DIAGNOSIS — R9431 Abnormal electrocardiogram [ECG] [EKG]: Secondary | ICD-10-CM | POA: Insufficient documentation

## 2021-05-04 DIAGNOSIS — F32A Depression, unspecified: Secondary | ICD-10-CM

## 2021-05-04 DIAGNOSIS — Z8616 Personal history of COVID-19: Secondary | ICD-10-CM | POA: Diagnosis not present

## 2021-05-04 DIAGNOSIS — F1011 Alcohol abuse, in remission: Secondary | ICD-10-CM | POA: Insufficient documentation

## 2021-05-04 DIAGNOSIS — R002 Palpitations: Secondary | ICD-10-CM | POA: Insufficient documentation

## 2021-05-04 DIAGNOSIS — E876 Hypokalemia: Secondary | ICD-10-CM | POA: Insufficient documentation

## 2021-05-04 DIAGNOSIS — I429 Cardiomyopathy, unspecified: Secondary | ICD-10-CM | POA: Diagnosis not present

## 2021-05-04 DIAGNOSIS — I5022 Chronic systolic (congestive) heart failure: Secondary | ICD-10-CM | POA: Insufficient documentation

## 2021-05-04 DIAGNOSIS — Z79899 Other long term (current) drug therapy: Secondary | ICD-10-CM | POA: Diagnosis not present

## 2021-05-04 DIAGNOSIS — I110 Hypertensive heart disease with heart failure: Secondary | ICD-10-CM | POA: Diagnosis present

## 2021-05-04 MED ORDER — OMEPRAZOLE 20 MG PO CPDR: 20.0000 mg | DELAYED_RELEASE_CAPSULE | Freq: Every day | ORAL | 5 refills | Status: DC

## 2021-05-04 MED ORDER — SERTRALINE HCL 50 MG PO TABS: 75.0000 mg | ORAL_TABLET | Freq: Every day | ORAL | 5 refills | Status: DC

## 2021-05-04 NOTE — Patient Instructions (Addendum)
INCREASE Sertraline to 75 mg 1 1/2 tablet daily   Your physician recommends that you schedule a follow-up appointment in: 3 months with echocardiogram  At the [facility] Clinic, you and your health needs are our priority. As part of our continuing mission to provide you with exceptional heart care, we have created designated Provider Care Teams. These Care Teams include your primary Cardiologist (physician) and Advanced Practice Providers (APPs- Physician Assistants and Nurse Practitioners) who all work together to provide you with the care you need, when you need it.   You may see any of the following providers on your designated Care Team at your next follow up: Dr Arvilla Meres Dr Carron Curie, NP Robbie Lis, [place] [facility] [place], [place] Karle Plumber, PharmD   Please be sure to bring in all your medications bottles to every appointment.   If you have any questions or concerns before your next appointment please send [place] a message through [place] or call our office at [phone].    TO LEAVE A MESSAGE FOR THE NURSE SELECT OPTION 2, PLEASE LEAVE A MESSAGE INCLUDING: YOUR NAME DATE OF BIRTH CALL BACK NUMBER REASON FOR CALL**this is important as we prioritize the call backs  YOU WILL RECEIVE A CALL BACK THE SAME DAY AS LONG AS YOU CALL BEFORE 4:00 PM

## 2021-05-14 ENCOUNTER — Ambulatory Visit: Payer: Self-pay

## 2021-05-25 ENCOUNTER — Other Ambulatory Visit: Payer: Self-pay

## 2021-05-25 ENCOUNTER — Emergency Department (HOSPITAL_COMMUNITY): Payer: Medicaid Other

## 2021-05-25 ENCOUNTER — Encounter (HOSPITAL_COMMUNITY): Payer: Self-pay | Admitting: *Deleted

## 2021-05-25 DIAGNOSIS — Z20822 Contact with and (suspected) exposure to covid-19: Secondary | ICD-10-CM | POA: Insufficient documentation

## 2021-05-25 DIAGNOSIS — J101 Influenza due to other identified influenza virus with other respiratory manifestations: Secondary | ICD-10-CM | POA: Insufficient documentation

## 2021-05-25 DIAGNOSIS — Z79899 Other long term (current) drug therapy: Secondary | ICD-10-CM | POA: Insufficient documentation

## 2021-05-25 DIAGNOSIS — M549 Dorsalgia, unspecified: Secondary | ICD-10-CM | POA: Insufficient documentation

## 2021-05-25 DIAGNOSIS — I5022 Chronic systolic (congestive) heart failure: Secondary | ICD-10-CM | POA: Insufficient documentation

## 2021-05-25 DIAGNOSIS — I110 Hypertensive heart disease with heart failure: Secondary | ICD-10-CM | POA: Diagnosis not present

## 2021-05-25 DIAGNOSIS — R000 Tachycardia, unspecified: Secondary | ICD-10-CM | POA: Insufficient documentation

## 2021-05-25 DIAGNOSIS — R109 Unspecified abdominal pain: Secondary | ICD-10-CM | POA: Insufficient documentation

## 2021-05-25 DIAGNOSIS — R0602 Shortness of breath: Secondary | ICD-10-CM | POA: Diagnosis present

## 2021-05-25 DIAGNOSIS — Z955 Presence of coronary angioplasty implant and graft: Secondary | ICD-10-CM | POA: Diagnosis not present

## 2021-05-25 NOTE — ED Triage Notes (Signed)
Pt c/o chest pain that started over an hour ago while watching tv; pt states the pain is in the middle and does not radiate

## 2021-05-26 ENCOUNTER — Emergency Department (HOSPITAL_COMMUNITY)
Admission: EM | Admit: 2021-05-26 | Discharge: 2021-05-26 | Disposition: A | Discharge status: Home / Self Care | Payer: Medicaid Other | Attending: Emergency Medicine | Admitting: Emergency Medicine

## 2021-05-26 DIAGNOSIS — J101 Influenza due to other identified influenza virus with other respiratory manifestations: Secondary | ICD-10-CM

## 2021-05-26 LAB — HEPATIC FUNCTION PANEL
ALT: 16 U/L (ref 0–44)
AST: 19 U/L (ref 15–41)
Albumin: 4.4 g/dL (ref 3.5–5.0)
Alkaline Phosphatase: 46 U/L (ref 38–126)
Bilirubin, Direct: 0.1 mg/dL (ref 0.0–0.2)
Indirect Bilirubin: 0.5 mg/dL (ref 0.3–0.9)
Total Bilirubin: 0.6 mg/dL (ref 0.3–1.2)
Total Protein: 6.9 g/dL (ref 6.5–8.1)

## 2021-05-26 LAB — BASIC METABOLIC PANEL
Anion gap: 9 (ref 5–15)
BUN: 6 mg/dL (ref 6–20)
CO2: 24 mmol/L (ref 22–32)
Calcium: 8.8 mg/dL — ABNORMAL LOW (ref 8.9–10.3)
Chloride: 102 mmol/L (ref 98–111)
Creatinine, Ser: 0.85 mg/dL (ref 0.44–1.00)
GFR, Estimated: >60 mL/min (ref >60)
Glucose, Bld: 107 mg/dL — ABNORMAL HIGH (ref 70–99)
Potassium: 3.2 mmol/L — ABNORMAL LOW (ref 3.5–5.1)
Sodium: 135 mmol/L (ref 135–145)

## 2021-05-26 LAB — CBC
HCT: 34.8 % — ABNORMAL LOW (ref 36.0–46.0)
Hemoglobin: 11 g/dL — ABNORMAL LOW (ref 12.0–15.0)
MCH: 28.2 pg (ref 26.0–34.0)
MCHC: 31.6 g/dL (ref 30.0–36.0)
MCV: 89.2 fL (ref 80.0–100.0)
Platelets: 166 10*3/uL (ref 150–400)
RBC: 3.9 MIL/uL (ref 3.87–5.11)
RDW: 13 % (ref 11.5–15.5)
WBC: 5.6 10*3/uL (ref 4.0–10.5)
nRBC: 0 % (ref 0.0–0.2)

## 2021-05-26 LAB — RESP PANEL BY RT-PCR (FLU A&B, COVID) ARPGX2
Influenza A by PCR: POSITIVE — AB
Influenza B by PCR: NEGATIVE
SARS Coronavirus 2 by RT PCR: NEGATIVE

## 2021-05-26 LAB — BRAIN NATRIURETIC PEPTIDE: B Natriuretic Peptide: 24 pg/mL (ref 0.0–100.0)

## 2021-05-26 LAB — LIPASE, BLOOD: Lipase: 24 U/L (ref 11–51)

## 2021-05-26 LAB — TROPONIN I (HIGH SENSITIVITY)
Troponin I (High Sensitivity): <2 ng/L (ref <18)
Troponin I (High Sensitivity): <2 ng/L (ref <18)

## 2021-05-26 MED ORDER — HYDROMORPHONE HCL 1 MG/ML IJ SOLN
1.0000 mg | Freq: Once | INTRAMUSCULAR | Status: AC
  Administered 2021-05-26: 1 mg via INTRAVENOUS

## 2021-05-26 MED ORDER — ONDANSETRON HCL 4 MG/2ML IJ SOLN
4.0000 mg | Freq: Once | INTRAMUSCULAR | Status: AC
  Administered 2021-05-26: 4 mg via INTRAVENOUS

## 2021-05-26 MED ORDER — OSELTAMIVIR PHOSPHATE 75 MG PO CAPS: 75.0000 mg | ORAL_CAPSULE | Freq: Two times a day (BID) | ORAL | 0 refills | Status: DC

## 2021-05-26 MED ORDER — OSELTAMIVIR PHOSPHATE 75 MG PO CAPS
75.0000 mg | ORAL_CAPSULE | Freq: Once | ORAL | Status: AC
  Administered 2021-05-26: 75 mg via ORAL

## 2021-05-26 NOTE — ED Provider Notes (Signed)
[facility] EMERGENCY DEPARTMENT Provider Note   CSN: [id number] Arrival date & time: 05/25/21  2243     History Chief Complaint  Patient presents with   Chest Pain    Tina Fletcher is a 35 y.o. female.  Patient presents to the emergency department for evaluation of chest pain, abdominal pain, back pain.  Patient reports that symptoms began approximately an hour ago.  She is experiencing sharp pain in the center of her abdomen that radiates through into the back and then goes up into the chest.      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 complication    Hypertension    Medical history non-contributory    Migraines    Nexplanon insertion 08/13/2013   08/13/13 inserted left arm, remove 08/13/16   Vitamin D deficiency 04/01/2016    Patient Active Problem List   Diagnosis Date Noted   Hypertensive crisis 08/18/2020   Hypokalemia 08/18/2020   Prolonged QT interval 08/18/2020   Syncope    Chronic systolic CHF (congestive heart failure) (HCC)    Cervical cancer screening 10/23/2019   Systolic CHF, acute (HCC) 05/10/2019   Vitamin D deficiency 04/01/2016   Nexplanon insertion 08/13/2013   Thrombocytopenia (HCC) 04/17/2013   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   Underweight 01/08/2013    Past Surgical History:  Procedure Laterality Date   RIGHT/LEFT HEART CATH AND CORONARY ANGIOGRAPHY N/A 05/14/2019   Procedure: RIGHT/LEFT HEART CATH AND CORONARY ANGIOGRAPHY;  Surgeon: Laurey Morale, MD;  Location: [facility] INVASIVE CV LAB;  Service: Cardiovascular;  Laterality: N/A;     OB History     Gravida  2   Para  2   Term  2   Preterm      AB      Living  2      SAB      IAB      Ectopic      Multiple      Live Births  2           Family History  Problem Relation Age of Onset   Diabetes Maternal Grandmother    Cancer Paternal Grandfather         prancreatic   COPD Maternal Grandfather    Cancer Maternal Grandfather        prostate   Asthma Maternal Grandfather    Asthma Son    Bronchitis Son     Social History   Tobacco Use   Smoking status: Never   Smokeless tobacco: Never  Vaping Use   Vaping Use: Never used  Substance Use Topics   Alcohol use: Not Currently   Drug use: No    Home Medications Prior to Admission medications   Medication Sig Start Date End Date Taking? Authorizing Provider  oseltamivir (TAMIFLU) 75 MG capsule Take 1 capsule (75 mg total) by mouth every 12 (twelve) hours. 05/26/21  Yes Retal Tonkinson, Canary Brim, MD  diclofenac Sodium (VOLTAREN) 1 % GEL Apply 4 g topically 4 (four) times daily. 02/23/21   Hoy Register, MD  etonogestrel (NEXPLANON) 68 MG IMPL implant Inject 1 each into the skin continuous.    [provider]  HYDROcodone-acetaminophen (NORCO/VICODIN) 5-325 MG tablet Take 1-2 tablets by mouth every 6 (six) hours as needed. 03/05/21   Vanetta Mulders, MD  Iron, Ferrous Sulfate, 325 (65 Fe) MG TABS Take 325 mg by mouth daily.  04/09/21   Anders Simmonds, PA-C  metoprolol succinate (TOPROL XL) 25 MG 24 hr tablet Take 0.5 tablets (12.5 mg total) by mouth at bedtime. 04/09/21 04/09/22  Anders Simmonds, PA-C  omeprazole (PRILOSEC) 20 MG capsule Take 1 capsule (20 mg total) by mouth daily. 05/04/21 06/03/21  Jacklynn Ganong, FNP  potassium chloride (KLOR-CON) 10 MEQ tablet Take 2 tablets (20 mEq total) by mouth daily. 04/09/21   Anders Simmonds, PA-C  sertraline (ZOLOFT) 50 MG tablet Take 1.5 tablets (75 mg total) by mouth daily. 05/04/21   Jacklynn Ganong, FNP    Allergies    Fentanyl  Review of Systems   Review of Systems  Cardiovascular:  Positive for chest pain.  Gastrointestinal:  Positive for abdominal pain.  Musculoskeletal:  Positive for back pain.  All other systems reviewed and are negative.  Physical Exam Updated Vital Signs BP (!) 130/94   Pulse (!) 121    Temp 99.9 F (37.7 C)   Resp 18   Ht 5\' 9"  (1.753 m)   Wt 59 kg   LMP 05/22/2021   SpO2 97%   BMI 19.20 kg/m   Physical Exam Vitals and nursing note reviewed.  Constitutional:      General: She is not in acute distress.    Appearance: Normal appearance. She is well-developed.  HENT:     Head: Normocephalic and atraumatic.     Right Ear: Hearing normal.     Left Ear: Hearing normal.     Nose: Nose normal.  Eyes:     Conjunctiva/sclera: Conjunctivae normal.     Pupils: Pupils are equal, round, and reactive to light.  Cardiovascular:     Rate and Rhythm: Regular rhythm. Tachycardia present.     Heart sounds: S1 normal and S2 normal. No murmur heard.   No friction rub. No gallop.  Pulmonary:     Effort: Pulmonary effort is normal. No respiratory distress.     Breath sounds: Normal breath sounds.  Chest:     Chest wall: No tenderness.  Abdominal:     General: Bowel sounds are normal.     Palpations: Abdomen is soft.     Tenderness: There is no abdominal tenderness. There is no guarding or rebound. Negative signs include Murphy's sign and McBurney's sign.     Hernia: No hernia is present.  Musculoskeletal:        General: Normal range of motion.     Cervical back: Normal range of motion and neck supple.  Skin:    General: Skin is warm and dry.     Findings: No rash.  Neurological:     Mental Status: She is alert and oriented to person, place, and time.     GCS: GCS eye subscore is 4. GCS verbal subscore is 5. GCS motor subscore is 6.     Cranial Nerves: No cranial nerve deficit.     Sensory: No sensory deficit.     Coordination: Coordination normal.  Psychiatric:        Mood and Affect: Affect is tearful.        Speech: Speech normal.        Behavior: Behavior normal.        Thought Content: Thought content normal.    ED Results / Procedures / Treatments   Labs (all labs ordered are listed, but only abnormal results are displayed) Labs Reviewed  RESP PANEL BY  RT-PCR (FLU A&B, COVID) ARPGX2 - Abnormal; Notable for the following  components:      Result Value   Influenza A by PCR POSITIVE (*)    All other components within normal limits  BASIC METABOLIC PANEL - Abnormal; Notable for the following components:   Potassium 3.2 (*)    Glucose, Bld 107 (*)    Calcium 8.8 (*)    All other components within normal limits  CBC - Abnormal; Notable for the following components:   Hemoglobin 11.0 (*)    HCT 34.8 (*)    All other components within normal limits  HEPATIC FUNCTION PANEL  LIPASE, BLOOD  BRAIN NATRIURETIC PEPTIDE  URINALYSIS, ROUTINE W REFLEX MICROSCOPIC  POC URINE PREG, ED  TROPONIN I (HIGH SENSITIVITY)  TROPONIN I (HIGH SENSITIVITY)    EKG EKG Interpretation  Date/Time:  Monday May 25 2021 22:50:20 EST Ventricular Rate:  132 PR Interval:  144 QRS Duration: 66 QT Interval:  278 QTC Calculation: 411 R Axis:   76 Text Interpretation: Sinus tachycardia Biatrial enlargement ST & T wave abnormality, consider inferior ischemia ST & T wave abnormality, consider anterolateral ischemia Abnormal ECG No significant change since last tracing Confirmed by Gilda Crease [phone]) on 05/26/2021 12:30:38 AM  Radiology DG Chest 2 View  Result Date: 05/25/2021 CLINICAL DATA:  Chest pain. EXAM: CHEST - 2 VIEW COMPARISON:  Chest radiograph dated 04/18/2021. FINDINGS: The heart size and mediastinal contours are within normal limits. Both lungs are clear. The visualized skeletal structures are unremarkable. IMPRESSION: No active cardiopulmonary disease. Electronically Signed   By: Elgie Collard M.D.   On: 05/25/2021 23:59    Procedures Procedures   Medications Ordered in ED Medications  oseltamivir (TAMIFLU) capsule 75 mg (has no administration in time range)  HYDROmorphone (DILAUDID) injection 1 mg (1 mg Intravenous Given 05/26/21 0059)  ondansetron (ZOFRAN) injection 4 mg (4 mg Intravenous Given 05/26/21 0059)    ED Course  I  have reviewed the triage vital signs and the nursing notes.  Pertinent labs & imaging results that were available during my care of the patient were reviewed by me and considered in my medical decision making (see chart for details).    MDM Rules/Calculators/A&P                           Patient presents because she is not feeling well.  She has been experiencing abdominal pain that goes into her back and up into her chest.  She had a borderline low-grade fever at arrival and was mildly tachycardic.  Reviewing her records reveals numerous work-ups for noncardiac chest pain.  Symptoms are atypical for cardiac at this time.  Work-up is reassuring.  Additionally, abdominal exam is benign.  Lab work is also reassuring, no leukocytosis, no LFT elevation.  Patient did test positive for influenza which would explain her symptoms of tachycardia, low-grade fever.  We will treat with Tamiflu.  No further work-up necessary.  Final Clinical Impression(s) / ED Diagnoses Final diagnoses:  Influenza A    Rx / DC Orders ED Discharge Orders          Ordered    oseltamivir (TAMIFLU) 75 MG capsule  Every 12 hours        05/26/21 0228             Gilda Crease, MD 05/26/21 [phone]

## 2021-06-03 ENCOUNTER — Emergency Department (HOSPITAL_COMMUNITY)
Admission: EM | Admit: 2021-06-03 | Discharge: 2021-06-03 | Disposition: A | Discharge status: Home / Self Care | Payer: Medicaid Other | Attending: Emergency Medicine | Admitting: Emergency Medicine

## 2021-06-03 ENCOUNTER — Encounter (HOSPITAL_COMMUNITY): Payer: Self-pay | Admitting: *Deleted

## 2021-06-03 ENCOUNTER — Emergency Department (HOSPITAL_COMMUNITY): Payer: Medicaid Other

## 2021-06-03 ENCOUNTER — Other Ambulatory Visit: Payer: Self-pay

## 2021-06-03 DIAGNOSIS — Z79899 Other long term (current) drug therapy: Secondary | ICD-10-CM | POA: Diagnosis not present

## 2021-06-03 DIAGNOSIS — I5023 Acute on chronic systolic (congestive) heart failure: Secondary | ICD-10-CM | POA: Diagnosis not present

## 2021-06-03 DIAGNOSIS — R000 Tachycardia, unspecified: Secondary | ICD-10-CM | POA: Insufficient documentation

## 2021-06-03 DIAGNOSIS — I110 Hypertensive heart disease with heart failure: Secondary | ICD-10-CM | POA: Diagnosis not present

## 2021-06-03 DIAGNOSIS — R079 Chest pain, unspecified: Secondary | ICD-10-CM | POA: Diagnosis present

## 2021-06-03 DIAGNOSIS — R0789 Other chest pain: Secondary | ICD-10-CM | POA: Insufficient documentation

## 2021-06-03 LAB — CBC WITH DIFFERENTIAL/PLATELET
Abs Immature Granulocytes: 0.02 10*3/uL (ref 0.00–0.07)
Basophils Absolute: 0 10*3/uL (ref 0.0–0.1)
Basophils Relative: 1 %
Eosinophils Absolute: 0.1 10*3/uL (ref 0.0–0.5)
Eosinophils Relative: 2 %
HCT: 34.9 % — ABNORMAL LOW (ref 36.0–46.0)
Hemoglobin: 11.5 g/dL — ABNORMAL LOW (ref 12.0–15.0)
Immature Granulocytes: 1 %
Lymphocytes Relative: 43 %
Lymphs Abs: 1.7 10*3/uL (ref 0.7–4.0)
MCH: 29.2 pg (ref 26.0–34.0)
MCHC: 33 g/dL (ref 30.0–36.0)
MCV: 88.6 fL (ref 80.0–100.0)
Monocytes Absolute: 0.3 10*3/uL (ref 0.1–1.0)
Monocytes Relative: 8 %
Neutro Abs: 1.8 10*3/uL (ref 1.7–7.7)
Neutrophils Relative %: 45 %
Platelets: 225 10*3/uL (ref 150–400)
RBC: 3.94 MIL/uL (ref 3.87–5.11)
RDW: 12.6 % (ref 11.5–15.5)
WBC: 3.9 10*3/uL — ABNORMAL LOW (ref 4.0–10.5)
nRBC: 0 % (ref 0.0–0.2)

## 2021-06-03 LAB — COMPREHENSIVE METABOLIC PANEL
ALT: 13 U/L (ref 0–44)
AST: 16 U/L (ref 15–41)
Albumin: 4.6 g/dL (ref 3.5–5.0)
Alkaline Phosphatase: 49 U/L (ref 38–126)
Anion gap: 9 (ref 5–15)
BUN: 12 mg/dL (ref 6–20)
CO2: 26 mmol/L (ref 22–32)
Calcium: 8.9 mg/dL (ref 8.9–10.3)
Chloride: 101 mmol/L (ref 98–111)
Creatinine, Ser: 0.79 mg/dL (ref 0.44–1.00)
GFR, Estimated: >60 mL/min (ref >60)
Glucose, Bld: 99 mg/dL (ref 70–99)
Potassium: 3.3 mmol/L — ABNORMAL LOW (ref 3.5–5.1)
Sodium: 136 mmol/L (ref 135–145)
Total Bilirubin: 0.6 mg/dL (ref 0.3–1.2)
Total Protein: 7.5 g/dL (ref 6.5–8.1)

## 2021-06-03 LAB — D-DIMER, QUANTITATIVE: D-Dimer, Quant: 0.29 ug/mL-FEU (ref 0.00–0.50)

## 2021-06-03 LAB — TROPONIN I (HIGH SENSITIVITY): Troponin I (High Sensitivity): <2 ng/L (ref <18)

## 2021-06-03 MED ORDER — SODIUM CHLORIDE 0.9 % IV BOLUS
1000.0000 mL | Freq: Once | INTRAVENOUS | Status: AC
  Administered 2021-06-03: 1000 mL via INTRAVENOUS

## 2021-06-03 NOTE — ED Provider Notes (Signed)
[facility] EMERGENCY DEPARTMENT Provider Note   CSN: [id number] Arrival date & time: 06/03/21  1120     History Chief Complaint  Patient presents with   Chest Pain    Tina Fletcher is a 35 y.o. female.  Patient presents chief complaint of chest pain.  Describes a sharp aching pain in the mid to right side of the chest that lasted about 3 to 4 seconds earlier today.  Since then she has had no recurrence of chest pain.  She states she had similar episodes in the pain in the past which resolved by themselves.  She sees cardiology but has not had any answer as to what has caused her pain.  She otherwise denies any fevers or cough no vomiting or diarrhea currently states she is symptom-free.  Denies any leg swelling.      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 complication    Hypertension    Medical history non-contributory    Migraines    Nexplanon insertion 08/13/2013   08/13/13 inserted left arm, remove 08/13/16   Vitamin D deficiency 04/01/2016    Patient Active Problem List   Diagnosis Date Noted   Hypertensive crisis 08/18/2020   Hypokalemia 08/18/2020   Prolonged QT interval 08/18/2020   Syncope    Chronic systolic CHF (congestive heart failure) (HCC)    Cervical cancer screening 10/23/2019   Systolic CHF, acute (HCC) 05/10/2019   Vitamin D deficiency 04/01/2016   Nexplanon insertion 08/13/2013   Thrombocytopenia (HCC) 04/17/2013   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 INVASIVE CV LAB;  Service: Cardiovascular;  Laterality: N/A;     OB History     Gravida  2   Para  2   Term  2   Preterm      AB       Living  2      SAB      IAB      Ectopic      Multiple      Live Births  2           Family History  Problem Relation Age of Onset   Diabetes Maternal Grandmother    Cancer Paternal Grandfather        prancreatic   COPD Maternal Grandfather    Cancer Maternal Grandfather        prostate   Asthma Maternal Grandfather    Asthma Son    Bronchitis Son     Social History   Tobacco Use   Smoking status: Never   Smokeless tobacco: Never  Vaping Use   Vaping Use: Never used  Substance Use Topics   Alcohol use: Not Currently   Drug use: No    Home Medications Prior to Admission medications   Medication Sig Start Date End Date Taking? Authorizing Provider  diclofenac Sodium (VOLTAREN) 1 % GEL Apply 4 g topically 4 (four) times daily. 02/23/21  Yes Hoy Register, MD  etonogestrel (NEXPLANON) 68 MG IMPL implant Inject 1 each into the skin continuous.   Yes [provider]  HYDROcodone-acetaminophen (NORCO/VICODIN) 5-325 MG tablet Take 1-2 tablets by mouth every 6 (six) hours  as needed. Patient taking differently: Take 1-2 tablets by mouth every 6 (six) hours as needed for moderate pain. 03/05/21  Yes Vanetta Mulders, MD  Iron, Ferrous Sulfate, 325 (65 Fe) MG TABS Take 325 mg by mouth daily. 04/09/21  Yes Georgian Co M, PA-C  metoprolol succinate (TOPROL XL) 25 MG 24 hr tablet Take 0.5 tablets (12.5 mg total) by mouth at bedtime. 04/09/21 04/09/22 Yes McClung, Marzella Schlein, PA-C  omeprazole (PRILOSEC) 20 MG capsule Take 1 capsule (20 mg total) by mouth daily. 05/04/21 06/03/21 Yes Milford, Anderson Malta, FNP  potassium chloride (KLOR-CON) 10 MEQ tablet Take 2 tablets (20 mEq total) by mouth daily. 04/09/21  Yes Georgian Co M, PA-C  sertraline (ZOLOFT) 50 MG tablet Take 1.5 tablets (75 mg total) by mouth daily. 05/04/21  Yes Milford, Anderson Malta, FNP  Vitamin D, Ergocalciferol, (DRISDOL) 1.25 MG (50000 UNIT) CAPS capsule Take 50,000 Units by mouth once a week.  04/30/21  Yes [provider]  oseltamivir (TAMIFLU) 75 MG capsule Take 1 capsule (75 mg total) by mouth every 12 (twelve) hours. Patient not taking: Reported on 06/03/2021 05/26/21   Gilda Crease, MD    Allergies    Fentanyl  Review of Systems   Review of Systems  Constitutional:  Negative for fever.  HENT:  Negative for ear pain.   Eyes:  Negative for pain.  Respiratory:  Negative for cough.   Cardiovascular:  Positive for chest pain.  Gastrointestinal:  Negative for abdominal pain.  Genitourinary:  Negative for flank pain.  Musculoskeletal:  Negative for back pain.  Skin:  Negative for rash.  Neurological:  Negative for headaches.   Physical Exam Updated Vital Signs BP (!) 150/96 (BP Location: Right Arm)   Pulse (!) 118   Temp 97.9 F (36.6 C) (Oral)   Resp 16   LMP 05/22/2021   SpO2 99%   Physical Exam Constitutional:      General: She is not in acute distress.    Appearance: Normal appearance.  HENT:     Head: Normocephalic.     Nose: Nose normal.  Eyes:     Extraocular Movements: Extraocular movements intact.  Cardiovascular:     Rate and Rhythm: Tachycardia present.  Pulmonary:     Effort: Pulmonary effort is normal.  Musculoskeletal:        General: Normal range of motion.     Cervical back: Normal range of motion.  Neurological:     General: No focal deficit present.     Mental Status: She is alert. Mental status is at baseline.    ED Results / Procedures / Treatments   Labs (all labs ordered are listed, but only abnormal results are displayed) Labs Reviewed  CBC WITH DIFFERENTIAL/PLATELET - Abnormal; Notable for the following components:      Result Value   WBC 3.9 (*)    Hemoglobin 11.5 (*)    HCT 34.9 (*)    All other components within normal limits  COMPREHENSIVE METABOLIC PANEL - Abnormal; Notable for the following components:   Potassium 3.3 (*)    All other components within normal limits  D-DIMER, QUANTITATIVE   TROPONIN I (HIGH SENSITIVITY)    EKG None  Radiology DG Chest Port 1 View  Result Date: 06/03/2021 CLINICAL DATA:  Chest pain with elevated blood pressure EXAM: PORTABLE CHEST 1 VIEW COMPARISON:  None. FINDINGS: The heart size and mediastinal contours are within normal limits. Both lungs are clear. The visualized skeletal structures are unremarkable. IMPRESSION: No  active disease. Electronically Signed   By: Larose Hires D.O.   On: 06/03/2021 12:36    Procedures Procedures   Medications Ordered in ED Medications  sodium chloride 0.9 % bolus 1,000 mL (1,000 mLs Intravenous New Bag/Given 06/03/21 1212)    ED Course  I have reviewed the triage vital signs and the nursing notes.  Pertinent labs & imaging results that were available during my care of the patient were reviewed by me and considered in my medical decision making (see chart for details).    MDM Rules/Calculators/A&P                           Presents with tachycardia and chest pain and is very atypical.  EKG is unremarkable no ST elevations depressions, rhythm is sinus.  Laboratory work-up unremarkable chemistry normal white count normal hemoglobin normal D-dimer negative.  Patient's heart rate spontaneously improved and now is about 80 to 90 bpm at rest.  She continues to be symptom-free.  Recommending outpatient follow-up with a cardiologist within the week.  Recommending immediate return for worsening pain fevers or any additional concerns.   Final Clinical Impression(s) / ED Diagnoses Final diagnoses:  Nonspecific chest pain    Rx / DC Orders ED Discharge Orders     None        Cheryll Cockayne, MD 06/03/21 1318

## 2021-06-03 NOTE — Discharge Instructions (Addendum)
Call your primary care doctor or specialist as discussed in the next 2-3 days.   Return immediately back to the ER if:  Your symptoms worsen within the next 12-24 hours. You develop new symptoms such as new fevers, persistent vomiting, new pain, shortness of breath, or new weakness or numbness, or if you have any other concerns.  

## 2021-06-03 NOTE — ED Triage Notes (Signed)
Pt states her BP was elevated at home with 150/108 and c/o intermittent chest pain

## 2021-06-04 IMAGING — DX DG CHEST 1V PORT
1 series · 1 of 1 positions shown · non-contrast
Comparison: Portable exam 1462 hours compared to 09/17/2020

CLINICAL DATA: Chest pain starting last night

EXAM:
PORTABLE CHEST 1 VIEW

[chest ap]
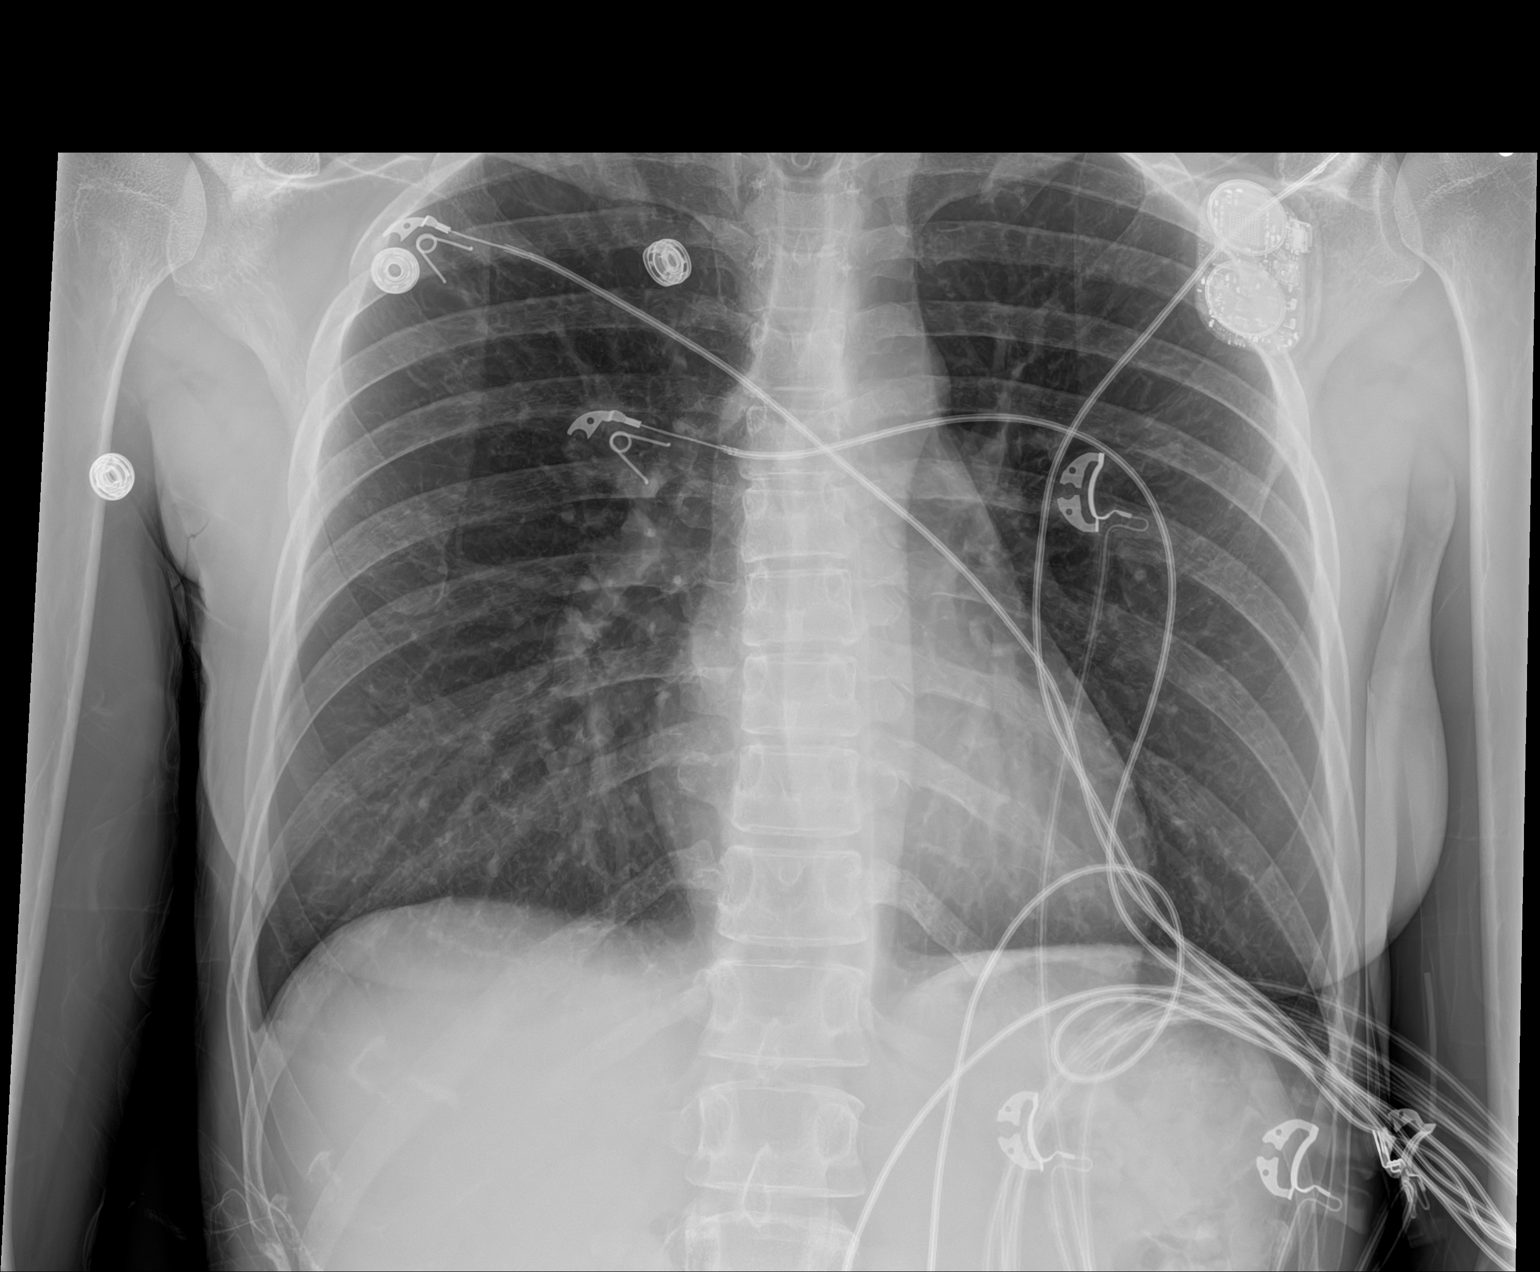

[1 of 1 positions shown; findings below may reference images not displayed]

FINDINGS: Normal heart size, mediastinal contours, and pulmonary vascularity.

Lungs clear.

No pulmonary infiltrate, pleural effusion, or pneumothorax.

Osseous structures unremarkable.
IMPRESSION: No acute abnormalities.

## 2021-06-11 IMAGING — DX DG CHEST 1V PORT
1 series · 1 of 1 positions shown · non-contrast
Comparison: Chest radiograph dated 09/27/2020 and CT dated
09/27/2020

CLINICAL DATA: 35-year-old female with chest pain

EXAM:
PORTABLE CHEST 1 VIEW

[chest ap]
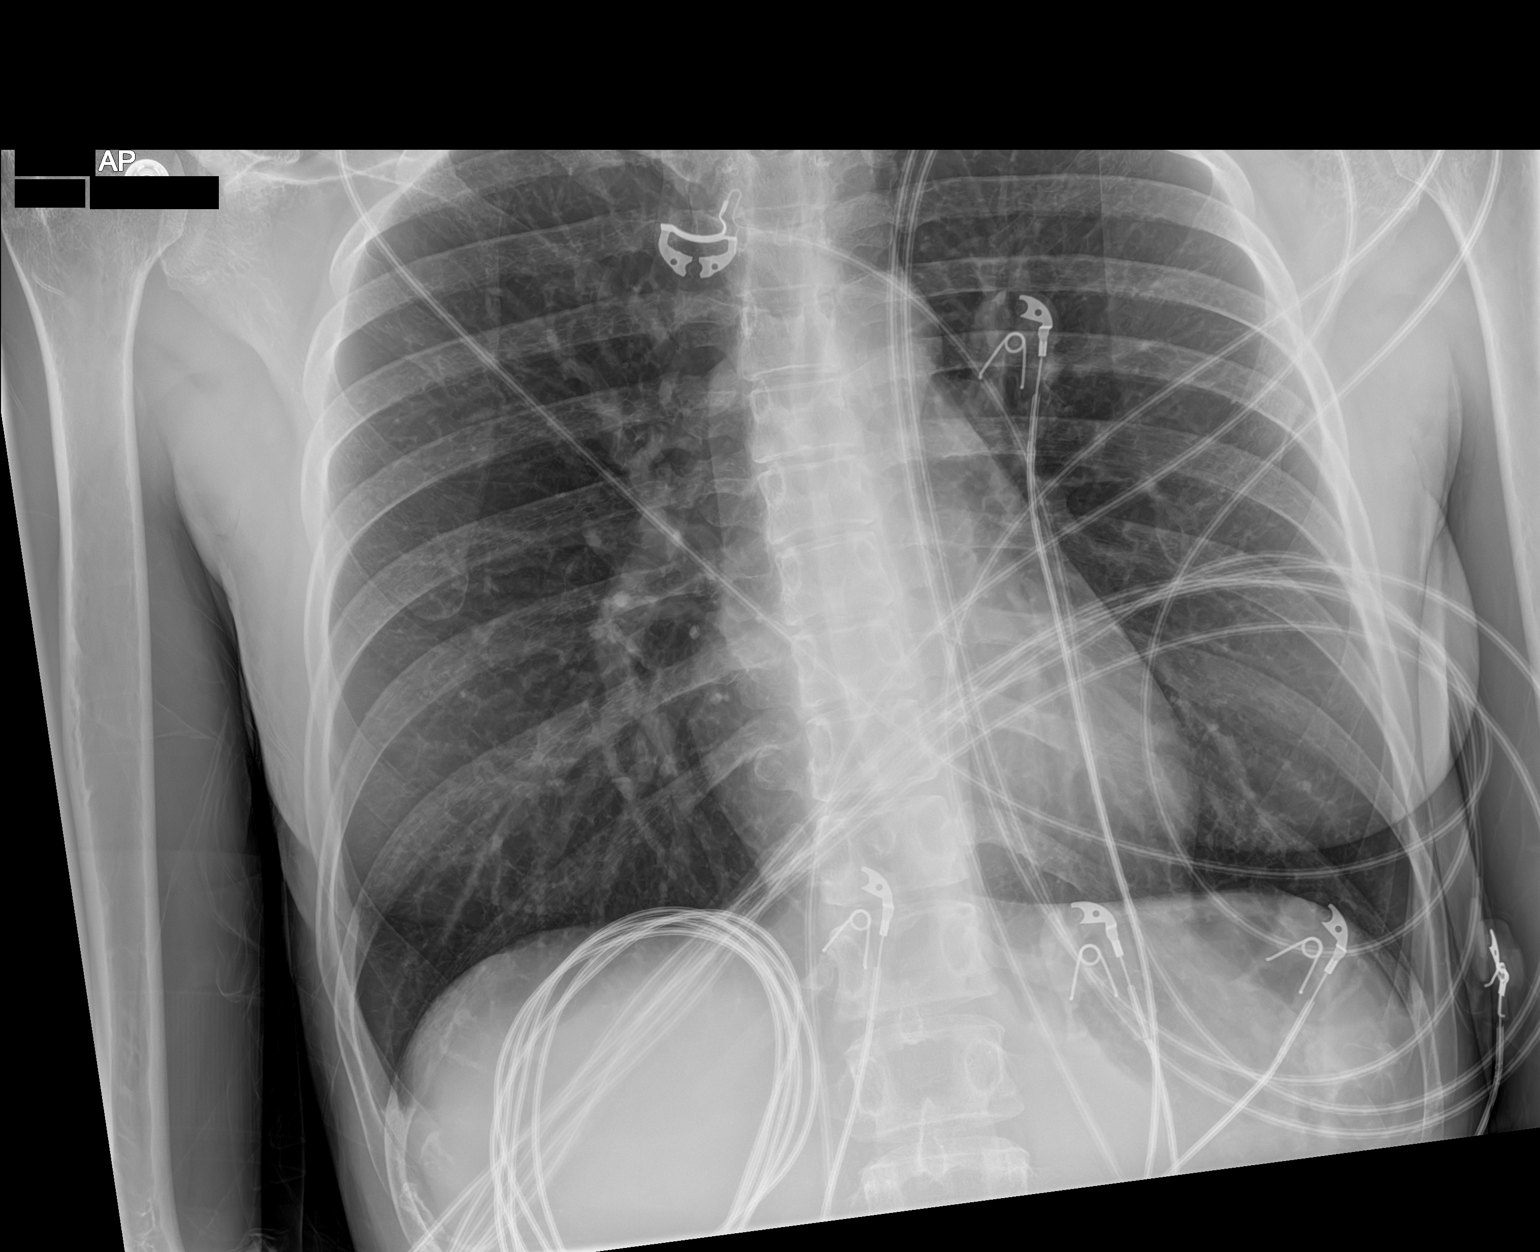

[1 of 1 positions shown; findings below may reference images not displayed]

FINDINGS: The heart size and mediastinal contours are within normal limits.
Both lungs are clear. The visualized skeletal structures are
unremarkable.
IMPRESSION: No active disease.

## 2021-06-12 IMAGING — CR DG CHEST 2V
2 series · 2 of 2 positions shown · non-contrast
Comparison: One-view chest x-ray 09/28/2020

CLINICAL DATA: Chest pain

EXAM:
CHEST - 2 VIEW

[w chest pa]
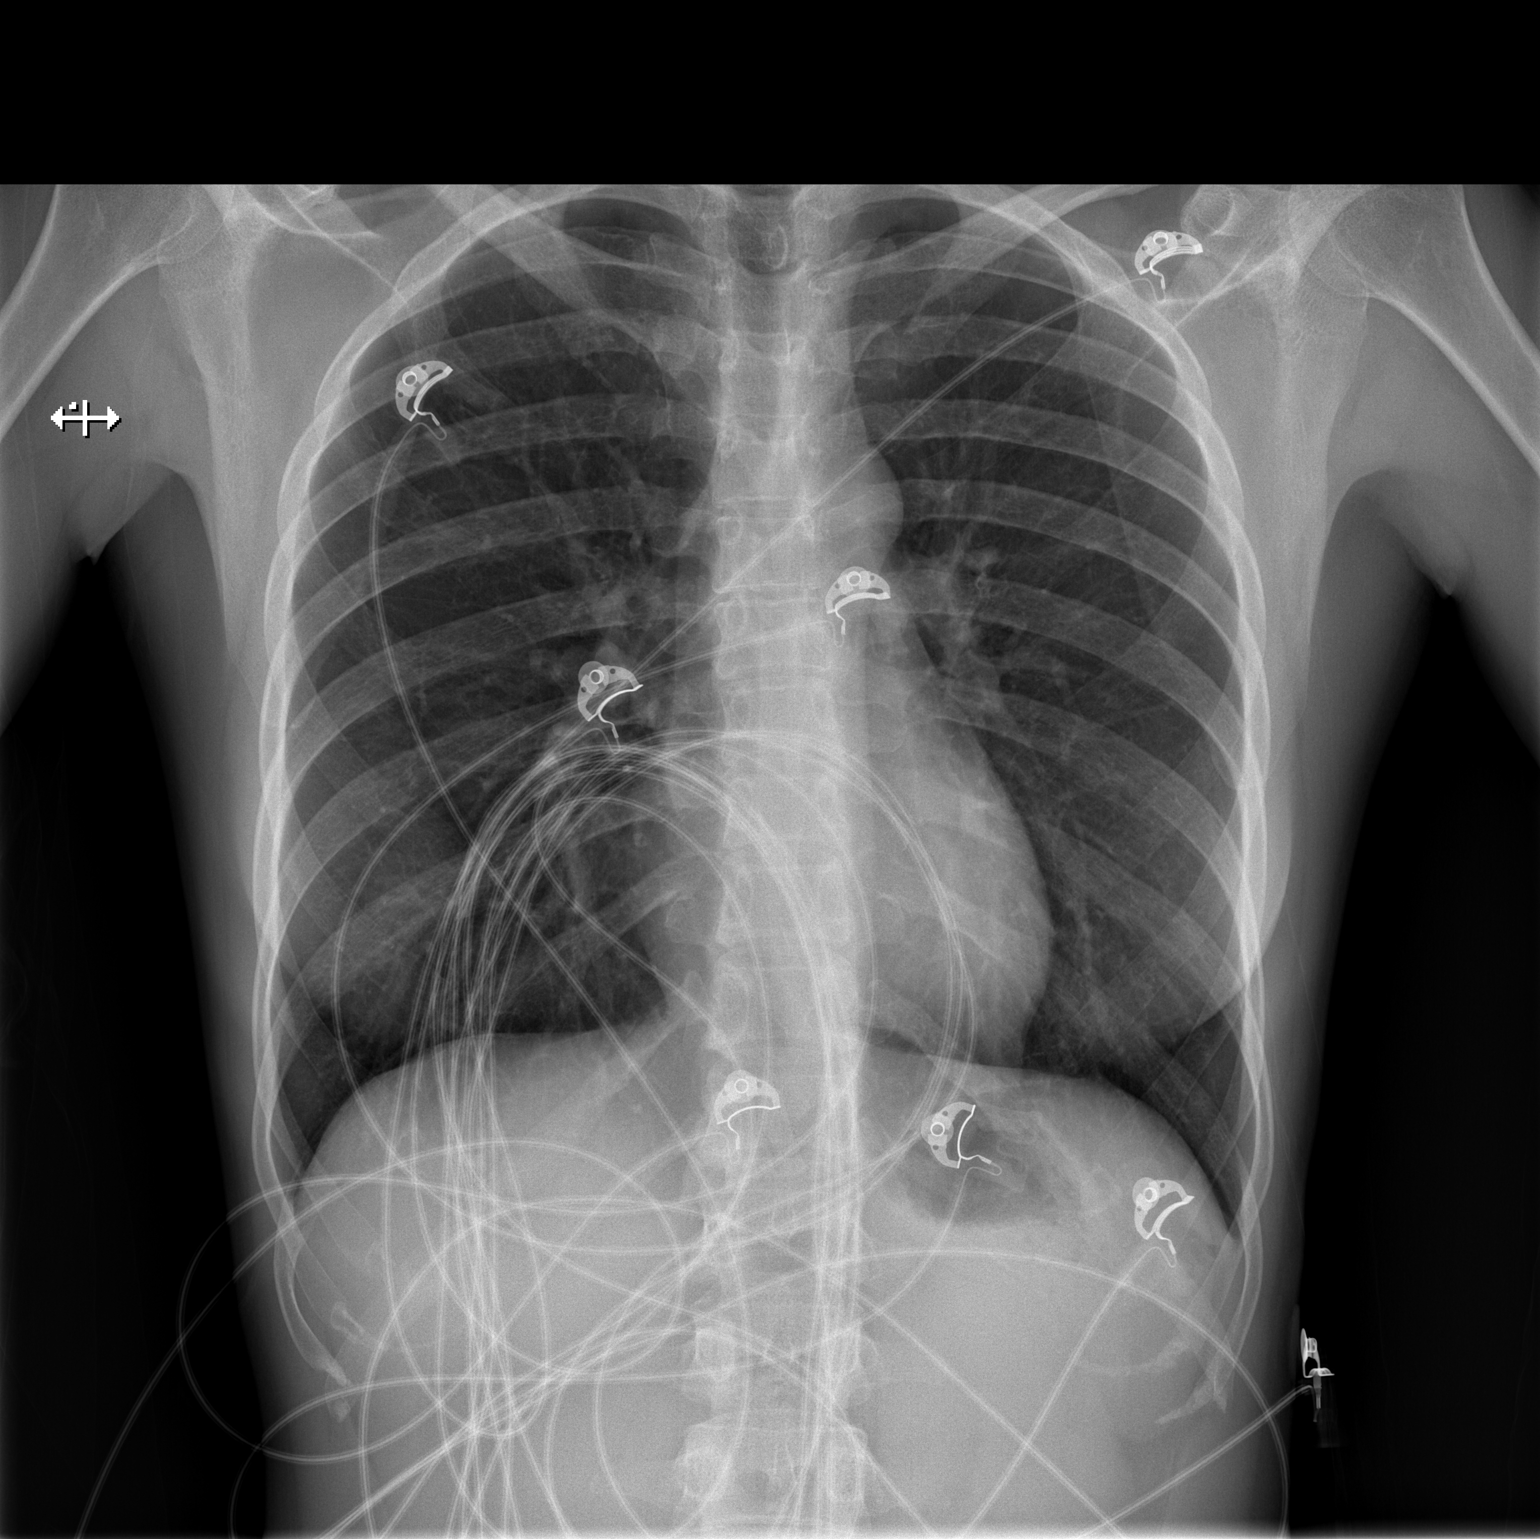

[w chest lat]
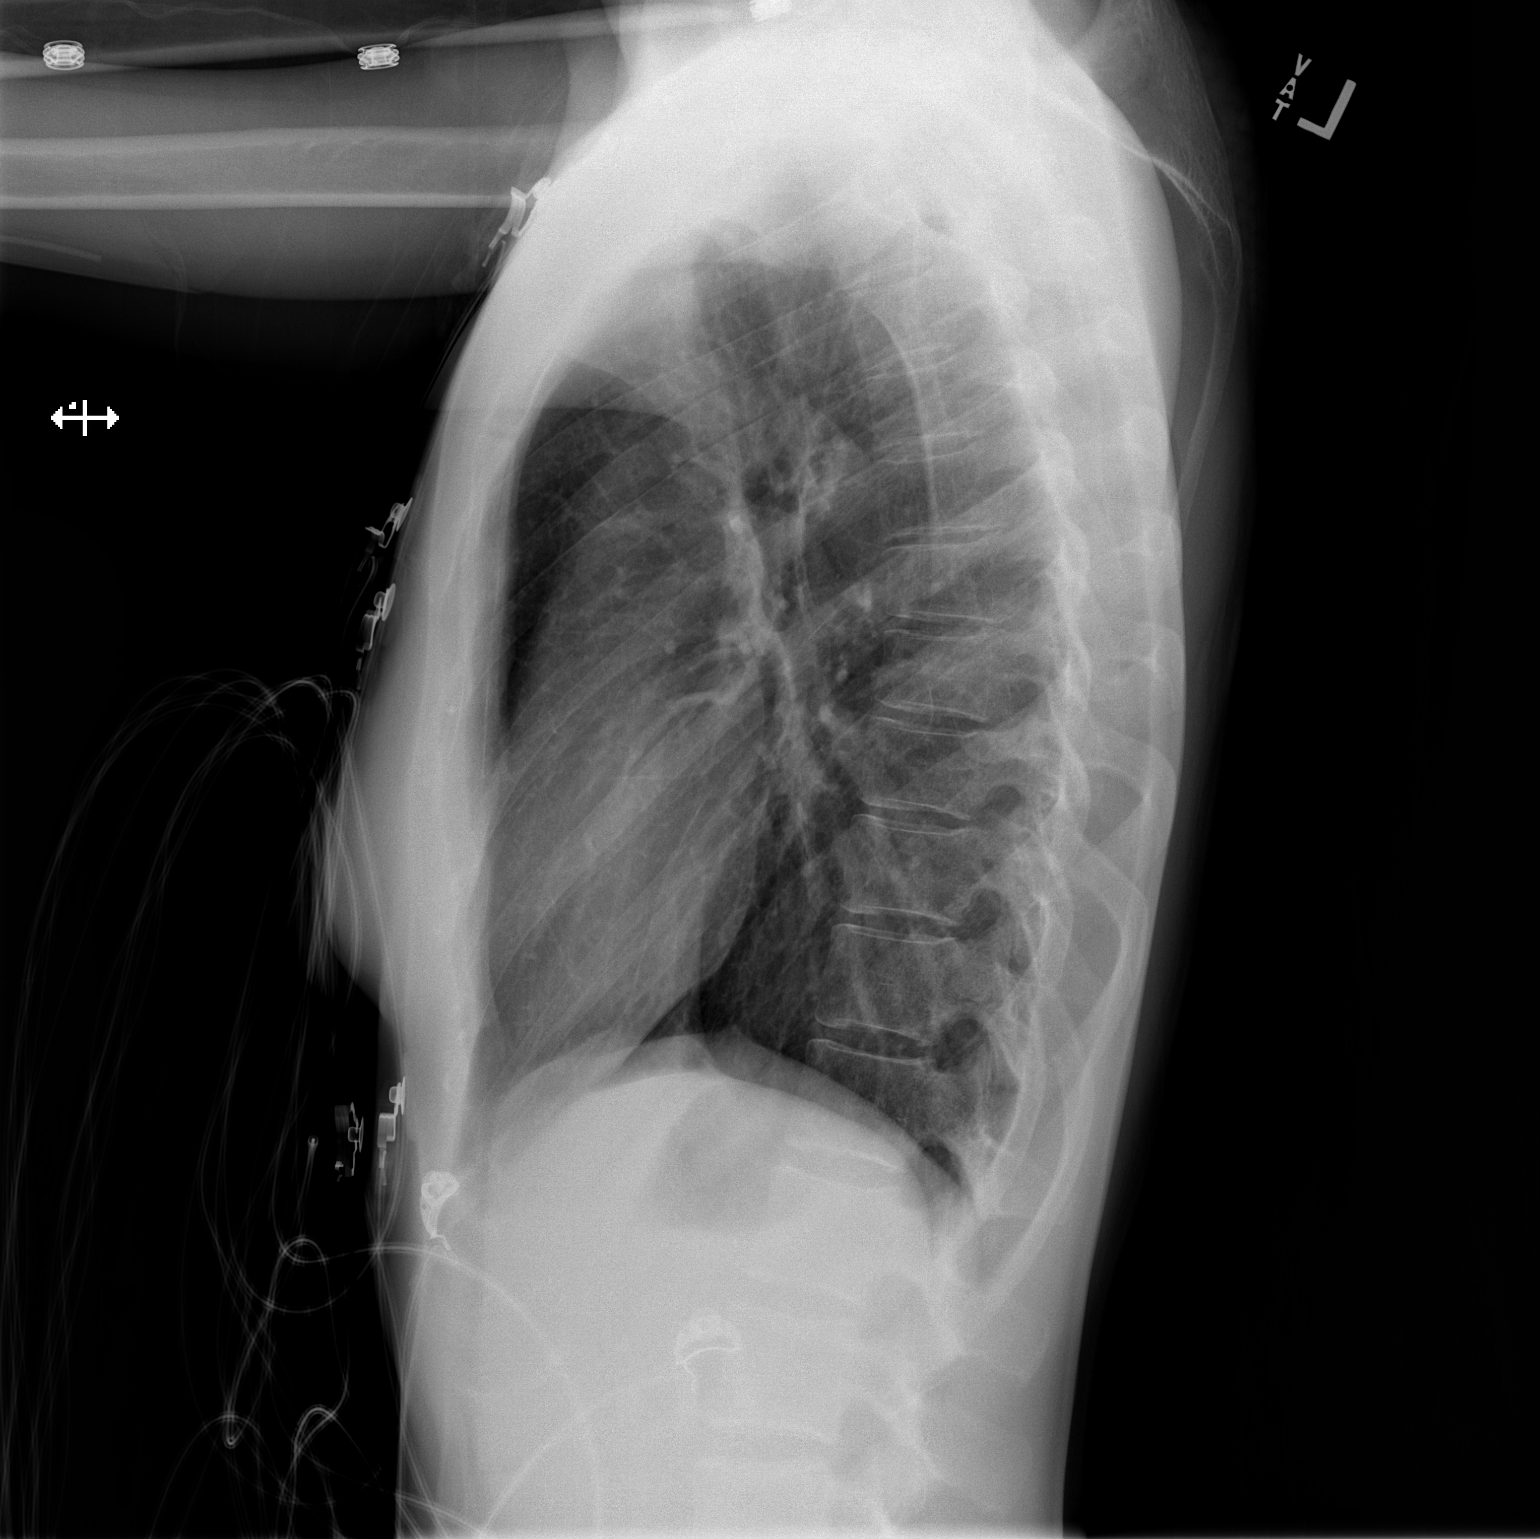

[2 of 2 positions shown; findings below may reference images not displayed]

FINDINGS: The heart size and mediastinal contours are within normal limits.
Both lungs are clear. The visualized skeletal structures are
unremarkable.
IMPRESSION: Negative two view chest x-ray

## 2021-06-20 ENCOUNTER — Encounter (HOSPITAL_COMMUNITY): Payer: Self-pay

## 2021-06-20 ENCOUNTER — Emergency Department (HOSPITAL_COMMUNITY)
Admission: EM | Admit: 2021-06-20 | Discharge: 2021-06-20 | Disposition: A | Discharge status: Home / Self Care | Payer: Medicaid Other | Attending: Emergency Medicine | Admitting: Emergency Medicine

## 2021-06-20 ENCOUNTER — Emergency Department (HOSPITAL_COMMUNITY): Payer: Medicaid Other

## 2021-06-20 DIAGNOSIS — R079 Chest pain, unspecified: Secondary | ICD-10-CM | POA: Insufficient documentation

## 2021-06-20 DIAGNOSIS — I5022 Chronic systolic (congestive) heart failure: Secondary | ICD-10-CM | POA: Insufficient documentation

## 2021-06-20 DIAGNOSIS — Z79899 Other long term (current) drug therapy: Secondary | ICD-10-CM | POA: Insufficient documentation

## 2021-06-20 DIAGNOSIS — I110 Hypertensive heart disease with heart failure: Secondary | ICD-10-CM | POA: Diagnosis not present

## 2021-06-20 LAB — CBC WITH DIFFERENTIAL/PLATELET
Abs Immature Granulocytes: 0.01 10*3/uL (ref 0.00–0.07)
Basophils Absolute: 0 10*3/uL (ref 0.0–0.1)
Basophils Relative: 0 %
Eosinophils Absolute: 0.1 10*3/uL (ref 0.0–0.5)
Eosinophils Relative: 2 %
HCT: 36 % (ref 36.0–46.0)
Hemoglobin: 11.5 g/dL — ABNORMAL LOW (ref 12.0–15.0)
Immature Granulocytes: 0 %
Lymphocytes Relative: 25 %
Lymphs Abs: 1.2 10*3/uL (ref 0.7–4.0)
MCH: 28.3 pg (ref 26.0–34.0)
MCHC: 31.9 g/dL (ref 30.0–36.0)
MCV: 88.7 fL (ref 80.0–100.0)
Monocytes Absolute: 0.5 10*3/uL (ref 0.1–1.0)
Monocytes Relative: 9 %
Neutro Abs: 3.2 10*3/uL (ref 1.7–7.7)
Neutrophils Relative %: 64 %
Platelets: 180 10*3/uL (ref 150–400)
RBC: 4.06 MIL/uL (ref 3.87–5.11)
RDW: 12.5 % (ref 11.5–15.5)
WBC: 5 10*3/uL (ref 4.0–10.5)
nRBC: 0 % (ref 0.0–0.2)

## 2021-06-20 LAB — BASIC METABOLIC PANEL
Anion gap: 8 (ref 5–15)
BUN: 9 mg/dL (ref 6–20)
CO2: 22 mmol/L (ref 22–32)
Calcium: 8.4 mg/dL — ABNORMAL LOW (ref 8.9–10.3)
Chloride: 108 mmol/L (ref 98–111)
Creatinine, Ser: 0.72 mg/dL (ref 0.44–1.00)
GFR, Estimated: >60 mL/min (ref >60)
Glucose, Bld: 91 mg/dL (ref 70–99)
Potassium: 4 mmol/L (ref 3.5–5.1)
Sodium: 138 mmol/L (ref 135–145)

## 2021-06-20 LAB — TROPONIN I (HIGH SENSITIVITY): Troponin I (High Sensitivity): 2 ng/L (ref <18)

## 2021-06-20 LAB — D-DIMER, QUANTITATIVE: D-Dimer, Quant: <0.27 ug/mL-FEU (ref 0.00–0.50)

## 2021-06-20 NOTE — Discharge Instructions (Signed)
Return to ED with any new or worsening symptoms such as shortness of breath, chest pain, lower extremity swelling, passing out Follow-up with your cardiologist in the next 7 to 10 days to evaluate your chest pain Attempt to utilize deep breathing exercises and mindfulness to alleviate any further anxiety

## 2021-06-20 NOTE — ED Triage Notes (Signed)
Chest pain for 30 minutes woke her up.  Sharp pain in the middle of her chest.  Skin warm and dry. Pt tearful in triage
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
## 2021-06-30 ENCOUNTER — Ambulatory Visit: Payer: Medicaid Other | Attending: Family Medicine

## 2021-06-30 NOTE — Therapy (Incomplete)
OUTPATIENT PHYSICAL THERAPY THORACOLUMBAR EVALUATION   Patient Name: Tina Fletcher MRN: [id number] DOB:March 11, 1986, 36 y.o., female Today's Date: 06/30/2021    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 complication    Hypertension    Medical history non-contributory    Migraines    Nexplanon insertion 08/13/2013   08/13/13 inserted left arm, remove 08/13/16   Vitamin D deficiency 04/01/2016   Past Surgical History:  Procedure Laterality Date   RIGHT/LEFT HEART CATH AND CORONARY ANGIOGRAPHY N/A 05/14/2019   Procedure: RIGHT/LEFT HEART CATH AND CORONARY ANGIOGRAPHY;  Surgeon: Laurey Morale, MD;  Location: [facility] INVASIVE CV LAB;  Service: Cardiovascular;  Laterality: N/A;   Patient Active Problem List   Diagnosis Date Noted   Hypertensive crisis 08/18/2020   Hypokalemia 08/18/2020   Prolonged QT interval 08/18/2020   Syncope    Chronic systolic CHF (congestive heart failure) (HCC)    Cervical cancer screening 10/23/2019   Systolic CHF, acute (HCC) 05/10/2019   Vitamin D deficiency 04/01/2016   Nexplanon insertion 08/13/2013   Thrombocytopenia (HCC) 04/17/2013   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 Yes VAS scale: ***/10 Pain location: *** Pain orientation: {Pain Orientation:25161}  PAIN TYPE: {type:313116} Pain description: {PAIN DESCRIPTION:21022940}  Aggravating  factors: *** Relieving factors: ***  PRECAUTIONS: {Therapy precautions:24002}  WEIGHT BEARING RESTRICTIONS {Yes ***/No:24003}  FALLS:  Has patient fallen in last 6 months? {yes/no:20286}, Number of falls: ***  LIVING ENVIRONMENT: Lives with: {[facility] lives with:25569::"lives with their family"} Lives in: {Lives in:25570} Stairs: {yes/no:20286}; {Stairs:24000} Has following equipment at home: {Assistive devices:23999}  OCCUPATION: ***  PLOF: {PLOF:24004}  PATIENT GOALS ***   OBJECTIVE:   DIAGNOSTIC FINDINGS:  No findings re: low back  PATIENT SURVEYS:  {rehab surveys:24030}  SCREENING FOR RED FLAGS: Bowel or bladder incontinence: {Yes/No:304960894} Spinal tumors: {Yes/No:304960894} Cauda equina syndrome: {Yes/No:304960894} Compression fracture: {Yes/No:304960894} Abdominal aneurysm: {Yes/No:304960894}  COGNITION:  Overall cognitive status: Within functional limits for tasks assessed     SENSATION:  Light touch: {intact/deficits:24005}   MUSCLE LENGTH: Hamstrings: Right ***  deg; Left *** deg Maisie Fus test: Right *** deg; Left *** deg  POSTURE:  ***  PALPATION: ***  LUMBARAROM/PROM  A/PROM A/PROM  06/30/2021  Flexion   Extension   Right lateral flexion   Left lateral flexion   Right rotation   Left rotation    (Blank rows = not tested)  LE AROM/PROM:  A/PROM Right 06/30/2021 Left 06/30/2021  Hip flexion    Hip extension    Hip abduction    Hip adduction    Hip internal rotation    Hip external rotation    Knee flexion    Knee extension    Ankle dorsiflexion    Ankle plantarflexion    Ankle inversion    Ankle eversion     (Blank rows = not tested)  LE MMT:  MMT Right 06/30/2021 Left 06/30/2021  Hip flexion    Hip extension    Hip abduction    Hip adduction    Hip internal rotation    Hip external rotation    Knee flexion    Knee extension    Ankle dorsiflexion    Ankle plantarflexion    Ankle inversion    Ankle eversion     (Blank  rows = not tested)  LUMBAR SPECIAL TESTS:  {lumbar special test:25242}  FUNCTIONAL TESTS:  {Functional tests:24029}  GAIT: Distance walked: *** Assistive device utilized: {Assistive devices:23999} Level of assistance: {Levels of assistance:24026} Comments: ***    TODAY'S TREATMENT  ***   PATIENT EDUCATION:  Education details: Eval findings, POC, HEP, advised re: sleeping positions and support for comfort Person educated: {Person educated:25204} Education method: {Education Method:25205} Education comprehension: {Education Comprehension:25206}   HOME EXERCISE PROGRAM: ***  ASSESSMENT:  CLINICAL IMPRESSION: Patient is a *** y.o. *** who was seen today for physical therapy evaluation and treatment for ***. Objective impairments include {opptimpairments:25111}. These impairments are limiting patient from {activity limitations:25113}. Personal factors including {Personal factors:25162} are also affecting patient's functional outcome. Patient will benefit from skilled PT to address above impairments and improve overall function.  REHAB POTENTIAL: {rehabpotential:25112}  CLINICAL DECISION MAKING: {clinical decision making:25114}  EVALUATION COMPLEXITY: {Evaluation complexity:25115}   GOALS:  SHORT TERM GOALS:  STG Name Target Date Goal status  1 *** Baseline:  07/21/21 INITIAL  2 *** Baseline:  07/21/21   INITIAL  3 *** Baseline: {follow up:25551} {GOALSTATUS:25110}  4 *** Baseline: {follow up:25551} {GOALSTATUS:25110}  5 *** Baseline: {follow up:25551} {GOALSTATUS:25110}  6 *** Baseline: {follow up:25551} {GOALSTATUS:25110}  7 *** Baseline: {follow up:25551} {GOALSTATUS:25110}   LONG TERM GOALS:   LTG Name Target Date Goal status  1 Pt wll be Ind in a final HEP to maintain achieved LOF Baseline: {:PHR,WPLUS8} INITIAL  2 *** Baseline: {follow up:25551} INITIAL  3 *** Baseline: {follow up:25551} INITIAL  4 *** Baseline: {follow up:25551}  {GOALSTATUS:25110}  5 *** Baseline: {follow up:25551} {GOALSTATUS:25110}  6 *** Baseline: {follow up:25551} {GOALSTATUS:25110}  7 *** Baseline: {follow up:25551} {GOALSTATUS:25110}   PLAN: PT FREQUENCY: {rehab frequency:25116}  PT DURATION: {rehab duration:25117}  PLANNED INTERVENTIONS: {rehab planned interventions:25118::"Therapeutic exercises","Therapeutic activity","Neuro Muscular re-education","Balance training","Gait training","Patient/Family education","Joint mobilization"}  PLAN FOR NEXT SESSION: Review HEP, progress there ex as indicated    TRUE Garciamartinez 06/30/2021, 5:59 AM

## 2021-08-06 ENCOUNTER — Other Ambulatory Visit: Payer: Self-pay

## 2021-08-06 ENCOUNTER — Encounter (HOSPITAL_COMMUNITY): Payer: Self-pay | Admitting: Cardiology

## 2021-08-06 ENCOUNTER — Ambulatory Visit (HOSPITAL_COMMUNITY)
Admission: RE | Admit: 2021-08-06 | Discharge: 2021-08-06 | Disposition: A | Discharge status: Home / Self Care | Payer: Medicaid Other | Source: Ambulatory Visit | Attending: Cardiology | Admitting: Cardiology

## 2021-08-06 ENCOUNTER — Ambulatory Visit (HOSPITAL_BASED_OUTPATIENT_CLINIC_OR_DEPARTMENT_OTHER)
Admission: RE | Admit: 2021-08-06 | Discharge: 2021-08-06 | Disposition: A | Discharge status: Home / Self Care | Payer: Medicaid Other | Source: Ambulatory Visit | Attending: Cardiology | Admitting: Cardiology

## 2021-08-06 VITALS — BP 116/78 | HR 82 | Wt 130.2 lb

## 2021-08-06 DIAGNOSIS — I5022 Chronic systolic (congestive) heart failure: Secondary | ICD-10-CM | POA: Insufficient documentation

## 2021-08-06 DIAGNOSIS — Z79899 Other long term (current) drug therapy: Secondary | ICD-10-CM | POA: Insufficient documentation

## 2021-08-06 DIAGNOSIS — F419 Anxiety disorder, unspecified: Secondary | ICD-10-CM | POA: Diagnosis not present

## 2021-08-06 DIAGNOSIS — F411 Generalized anxiety disorder: Secondary | ICD-10-CM | POA: Insufficient documentation

## 2021-08-06 DIAGNOSIS — R002 Palpitations: Secondary | ICD-10-CM | POA: Insufficient documentation

## 2021-08-06 DIAGNOSIS — F1011 Alcohol abuse, in remission: Secondary | ICD-10-CM | POA: Insufficient documentation

## 2021-08-06 DIAGNOSIS — I110 Hypertensive heart disease with heart failure: Secondary | ICD-10-CM | POA: Insufficient documentation

## 2021-08-06 DIAGNOSIS — I951 Orthostatic hypotension: Secondary | ICD-10-CM | POA: Insufficient documentation

## 2021-08-06 DIAGNOSIS — I428 Other cardiomyopathies: Secondary | ICD-10-CM | POA: Diagnosis not present

## 2021-08-06 LAB — ECHOCARDIOGRAM COMPLETE
AR max vel: 2.31 cm2
AV Peak grad: 6.3 mmHg
Ao pk vel: 1.25 m/s
Area-P 1/2: 4.21 cm2
Calc EF: 53.2 %
S' Lateral: 3.4 cm
Single Plane A2C EF: 53.8 %
Single Plane A4C EF: 52.3 %

## 2021-08-06 NOTE — Progress Notes (Signed)
Advanced Heart Failure Clinic Note   PCP: [facility] PCP-Cardiologist: Dr. Aundra Dubin    HPI: 35 y.o. female with past history of HTN presented to [facility] on 05/10/19 with cough, SOB, LE swelling, orthopnea, bendopnea and 20lb weight gain over 3 months. She had a history of increased alcohol use over the several months prior with 7-8 airplane bottles of liquor 3-4 days per week. BNP was elevated and CTA showed pulmonary edema. Echo showed new onset systolic HF with EF 0000000 and decreased RV systolic function. Cardiology was consulted and she was transferred to [facility].   While admitted she was diuresed with IV lasix. Right and Left heart cath were done. LHC showed no significant coronary artery disease and RHC was within normal limits. Her cardiomyopathy is possibly secondary to recent increased alcohol use or uncontrolled HTN. Viral myocarditis was felt to be a possible etiology with recent cough, however cMRI showed no myocardial LGE, so no definitive evidence for prior MI, infiltrative disease, or myocarditis.  Also of note, she has no family history of cardiomyopathy, negative HIV, and TSH was normal.   Echo in 3/21 showed EF 30-35%, global hypokinesis, mild RV dilation with normal function, mild MR.   On 06/06/20, she had a CBD gummy with a high dosage of CBD in it.  Shortly afterwards, she became lightheaded and dizzy and actually passed out.  She had been out of her meds for several days and her BP was elevated when she went to the ER.  HS-TnI and D dimer were normal. She was discharged home.   Echo in 1/22 showed EF up to 40-45%. She wore a Zio patch in 1/22 with no significant events but only kept it on for about 4 days.   In 2/22, she was admitted with hypertensive urgency with syncope.  BP 218/110.  She had not been taking her evening BP med doses.  BP was controlled and she was discharged.  Later in 2/22, she went to the ER with chest pain.  Troponin was negative but BP was  high.  She was sent home.   Evaluated in clinic, 08/22/20 and was hypertensive. Also very anxious. EKG showed NSR w/ mildly prolonged QTc 474 ms. Labs reviewed and showed hypokalemia. KCl 20 daily was added. Amlodipine 5 mg daily also added. I also started sertraline 25 mg daily for marked anxiety.   Had return clinic f/u on 3/1. Just prior to that had been seen in the ED on 2/27 for continued anxiety and palpitations. She reported the ED physician gave her something for possible withdrawals, which had helped significantly. She had quit drinking 8 days prior.  BMP was checked on 2/27 and hypokalemia had resolved, K 3.5 (recently as low at 2.9). SCr ok at 0.79. She continued w/ frequent episodes of low BP at home and dizziness as well as fatigue. Coreg was reduced to 3.125 bid and amlodipine discontinued.   She was evaluated 11 times at different EDs for chest pain.  No cardiac findings. Felt to be due to anxiety.   Returned 10/28/20 for HF follow up. She was taking metoprolol once a day due to soft BP. Volume and symptoms stable.  Evaluated in ED 11/19/20, 12/02/20, and 12/15/20 for CP. Cardiac work up unremarkable, EKGs reassuring. Felt to be due to anxiety and possibly GERD.  She had 2 more ER visits with chest pain thought to be related to anxiety in 12/22.   She had echo done today showing EF up to  99991111, normal diastolic function, normal strain pattern, normal RV.   She returns for followup of CHF. She is doing better.  Anxiety less prominent on sertraline.  She has not had ETOH for > 1 year.  No lightheadedness.  No exertional dyspnea or chest pain.  No orthopnea/PND.   ECG (personally reviewed): NSR, nonspecific T wave flattening   Labs (11/20): K 3.8, creatinine 1.1 Labs (2/21): K 3.6, creatinine 0.89 Labs (12/21): BNP 145, K 3.7, creatinine 0.67, hgb 12.2 Labs (2/22): K 3.4, creatinine 0.79, hgb 11.5 Labs (2/22): K 3.5, Creatinine 0.79 Labs (3/22): K 3.8, Creatinine 0.76 Labs (5/22): K  3.2, creatinine 1.01 Labs (6/22): K 3.4 => 4.1, creatinine 0.85 => 0.82 Labs (12/22): K 4, creatinine 0.72  PMH: 1. HTN - Renal artery dopplers (3/21): No evidence for renal artery stenosis.  2. ETOH abuse: Has quit.  3. Chronic systolic CHF: Nonischemic cardiomyopathy.  - LHC/RHC (11/20): No significant CAD; mean RA 1, PA 24/8, mean PCWP 5, CI 3.54.  - Echo (11/20): EF 15%, diffuse hypokinesis, mildly decreased RV systolic function.  - CMRI (11/20): LV mildly dilated with EF 16%, normal RV size with EF 28%, no myocardial LGE.  - Echo (3/21): EF 30-35%, global hypokinesis, mild RV dilation with normal function, mild MR.  - Echo (1/22): EF 40-45%, normal RV.  - Echo (2/23): EF 99991111, normal diastolic function, normal strain pattern, normal RV.  4. COVID-19 infection 9/21 5. Prolonged QT interval - Zio patch 3/22: No significant abnormalities.   Review of Systems: All systems reviewed and negative except as per HPI.   Current Outpatient Medications  Medication Sig Dispense Refill   diclofenac Sodium (VOLTAREN) 1 % GEL Apply 4 g topically 4 (four) times daily. 100 g 1   etonogestrel (NEXPLANON) 68 MG IMPL implant Inject 1 each into the skin continuous.     Iron, Ferrous Sulfate, 325 (65 Fe) MG TABS Take 325 mg by mouth daily. 60 tablet 3   meclizine (ANTIVERT) 12.5 MG tablet Take 12.5 mg by mouth as needed for dizziness.     metoprolol succinate (TOPROL XL) 25 MG 24 hr tablet Take 0.5 tablets (12.5 mg total) by mouth at bedtime. 45 tablet 3   omeprazole (PRILOSEC) 20 MG capsule Take 1 capsule (20 mg total) by mouth daily. 30 capsule 5   potassium chloride (KLOR-CON) 10 MEQ tablet Take 2 tablets (20 mEq total) by mouth daily. 60 tablet 2   sertraline (ZOLOFT) 50 MG tablet Take 1.5 tablets (75 mg total) by mouth daily. 45 tablet 5   Vitamin D, Ergocalciferol, (DRISDOL) 1.25 MG (50000 UNIT) CAPS capsule Take 50,000 Units by mouth once a week.     No current facility-administered  medications for this encounter.   Allergies  Allergen Reactions   Fentanyl Other (See Comments)    Headaches   Social History   Socioeconomic History   Marital status: Single    Spouse name: Not on file   Number of children: Not on file   Years of education: Not on file   Highest education level: Not on file  Occupational History   Not on file  Tobacco Use   Smoking status: Never   Smokeless tobacco: Never  Vaping Use   Vaping Use: Never used  Substance and Sexual Activity   Alcohol use: Not Currently   Drug use: No   Sexual activity: Yes    Birth control/protection: Implant  Other Topics Concern   Not on file  Social History Narrative  Not on file   Social Determinants of Health   Financial Resource Strain: Low Risk    Difficulty of Paying Living Expenses: Not hard at all  Food Insecurity: No Food Insecurity   Worried About Charity fundraiser in the Last Year: Never true   [place] in the Last Year: Never true  Transportation Needs: No Transportation Needs   Lack of Transportation (Medical): No   Lack of Transportation (Non-Medical): No  Physical Activity: Inactive   Days of Exercise per Week: 0 days   Minutes of Exercise per Session: 0 min  Stress: Not on file  Social Connections: Not on file  Intimate Partner Violence: Not on file   Family History  Problem Relation Age of Onset   Diabetes Maternal Grandmother    Cancer Paternal Grandfather        prancreatic   COPD Maternal Grandfather    Cancer Maternal Grandfather        prostate   Asthma Maternal Grandfather    Asthma Son    Bronchitis Son    BP 116/78    Pulse 82    Wt 59.1 kg (130 lb 3.2 oz)    SpO2 100%    BMI 19.23 kg/m   Wt Readings from Last 3 Encounters:  08/06/21 59.1 kg (130 lb 3.2 oz)  06/20/21 59 kg (130 lb)  05/25/21 59 kg (130 lb)   PHYSICAL EXAM: General: NAD Neck: No JVD, no thyromegaly or thyroid nodule.  Lungs: Clear to auscultation bilaterally with normal  respiratory effort. CV: Nondisplaced PMI.  Heart regular S1/S2, no S3/S4, no murmur.  No peripheral edema.  No carotid bruit.  Normal pedal pulses.  Abdomen: Soft, nontender, no hepatosplenomegaly, no distention.  Skin: Intact without lesions or rashes.  Neurologic: Alert and oriented x 3.  Psych: Normal affect. Extremities: No clubbing or cyanosis.  HEENT: Normal.   ASSESSMENT & PLAN:  1. Chronic systolic CHF: Nonischemic cardiomyopathy, probably due to ETOH.  No FH cardiomyopathy, negative HIV and normal TSH, no recent pregnancy.  She had been drinking heavily for many months prior to diagnosis, ETOH may be the main cause of her cardiomyopathy.  Ethel 04/2019 w/ no CAD, filling pressures optimized and preserved cardiac output. Cardiac MRI showed no myocardial LGE, so no definitive evidence for prior MI, infiltrative disease, or myocarditis. Echo in 3/21 showed EF 30-35%, echo in 1/22 with EF up to 40-45%, normal RV.  Echo today showed EF up to 50-55%.  I think that her EF has improved as a result of staying off ETOH (no ETOH x 1 year).   - Continue Toprol XL 12.5 mg qhs.  - No BP room for Entresto, spironolactone.  2. HTN: BP now running low, suspect lower now that she is no longer drinking ETOH.  - No evidence for fibromuscular dysplasia on renal artery dopplers.  3. ETOH abuse: She reports she has quit ETOH since 08/16/20. - Congratulated on abstinence. 4. 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 Still with prolonged QTc 490 msec today.  - Check BMET/Mg today.  - She is on beta blocker.  5. Anxiety: Marked generalized anxiety.  Feels better on sertraline but still anxious. - Follow up with PCP.  6. Palpitations, Syncope/ Orthostatic Hypotension: ? POTS in this young female. Saw Dr Caryl Comes and he felt this was anxiety.   Zio patch in 3/22 with no arrhythmias. Minimal palpitations now.  - Continue Toprol XL.   Follow up in 6 months with APP.  Loralie Champagne, MD 08/06/21

## 2021-08-06 NOTE — Patient Instructions (Signed)
Thank you for you visit today.  We have not changed any of your medications.   You have been referred to [facility]. They will call you to arrange an appointment.   Your physician recommends that you schedule a follow-up appointment in: 6 months (August 2023)  ** please call the office in June to arrange your follow up appointment**   If you have any questions or concerns before your next appointment please send [place] a message through [place] or call our office at [phone].    TO LEAVE A MESSAGE FOR THE NURSE SELECT OPTION 2, PLEASE LEAVE A MESSAGE INCLUDING: YOUR NAME DATE OF BIRTH CALL BACK NUMBER REASON FOR CALL**this is important as we prioritize the call backs  YOU WILL RECEIVE A CALL BACK THE SAME DAY AS LONG AS YOU CALL BEFORE 4:00 PM  At the Advanced Heart Failure Clinic, you and your health needs are our priority. As part of our continuing mission to provide you with exceptional heart care, we have created designated Provider Care Teams. These Care Teams include your primary Cardiologist (physician) and Advanced Practice Providers (APPs- Physician Assistants and Nurse Practitioners) who all work together to provide you with the care you need, when you need it.   You may see any of the following providers on your designated Care Team at your next follow up: Dr Arvilla Meres Dr Carron Curie, NP Robbie Lis, [place] [facility] [place], [place] Karle Plumber, PharmD   Please be sure to bring in all your medications bottles to every appointment.

## 2021-08-11 ENCOUNTER — Encounter (HOSPITAL_COMMUNITY): Payer: Self-pay | Admitting: *Deleted

## 2021-08-11 ENCOUNTER — Emergency Department (HOSPITAL_COMMUNITY)
Admission: EM | Admit: 2021-08-11 | Discharge: 2021-08-12 | Disposition: A | Discharge status: Home / Self Care | Payer: Medicaid Other | Source: Home / Self Care | Attending: Emergency Medicine | Admitting: Emergency Medicine

## 2021-08-11 ENCOUNTER — Emergency Department (HOSPITAL_COMMUNITY)
Admission: EM | Admit: 2021-08-11 | Discharge: 2021-08-11 | Disposition: A | Discharge status: Home / Self Care | Payer: Medicaid Other | Attending: Emergency Medicine | Admitting: Emergency Medicine

## 2021-08-11 ENCOUNTER — Other Ambulatory Visit: Payer: Self-pay

## 2021-08-11 ENCOUNTER — Encounter (HOSPITAL_COMMUNITY): Payer: Self-pay

## 2021-08-11 DIAGNOSIS — R1110 Vomiting, unspecified: Secondary | ICD-10-CM | POA: Insufficient documentation

## 2021-08-11 DIAGNOSIS — Z79899 Other long term (current) drug therapy: Secondary | ICD-10-CM | POA: Insufficient documentation

## 2021-08-11 DIAGNOSIS — R197 Diarrhea, unspecified: Secondary | ICD-10-CM | POA: Insufficient documentation

## 2021-08-11 DIAGNOSIS — R112 Nausea with vomiting, unspecified: Secondary | ICD-10-CM | POA: Insufficient documentation

## 2021-08-11 DIAGNOSIS — Z5321 Procedure and treatment not carried out due to patient leaving prior to being seen by health care provider: Secondary | ICD-10-CM | POA: Diagnosis not present

## 2021-08-11 DIAGNOSIS — K529 Noninfective gastroenteritis and colitis, unspecified: Secondary | ICD-10-CM

## 2021-08-11 DIAGNOSIS — I509 Heart failure, unspecified: Secondary | ICD-10-CM | POA: Insufficient documentation

## 2021-08-11 NOTE — ED Triage Notes (Signed)
Diarrhea with vomiting

## 2021-08-11 NOTE — ED Triage Notes (Signed)
Pt here earlier and left after triage. Diarrhea with vomiting.

## 2021-08-11 NOTE — ED Notes (Signed)
Pt visualized drinking water in lobby.

## 2021-08-12 LAB — CBC WITH DIFFERENTIAL/PLATELET
Abs Immature Granulocytes: 0.02 10*3/uL (ref 0.00–0.07)
Basophils Absolute: 0 10*3/uL (ref 0.0–0.1)
Basophils Relative: 0 %
Eosinophils Absolute: 0 10*3/uL (ref 0.0–0.5)
Eosinophils Relative: 0 %
HCT: 40.1 % (ref 36.0–46.0)
Hemoglobin: 12.6 g/dL (ref 12.0–15.0)
Immature Granulocytes: 0 %
Lymphocytes Relative: 9 %
Lymphs Abs: 0.7 10*3/uL (ref 0.7–4.0)
MCH: 27.3 pg (ref 26.0–34.0)
MCHC: 31.4 g/dL (ref 30.0–36.0)
MCV: 86.8 fL (ref 80.0–100.0)
Monocytes Absolute: 0.5 10*3/uL (ref 0.1–1.0)
Monocytes Relative: 7 %
Neutro Abs: 6.4 10*3/uL (ref 1.7–7.7)
Neutrophils Relative %: 84 %
Platelets: 257 10*3/uL (ref 150–400)
RBC: 4.62 MIL/uL (ref 3.87–5.11)
RDW: 12.7 % (ref 11.5–15.5)
WBC: 7.7 10*3/uL (ref 4.0–10.5)
nRBC: 0 % (ref 0.0–0.2)

## 2021-08-12 LAB — URINALYSIS, ROUTINE W REFLEX MICROSCOPIC
Bilirubin Urine: NEGATIVE
Glucose, UA: NEGATIVE mg/dL
Hgb urine dipstick: NEGATIVE
Ketones, ur: 80 mg/dL — AB
Nitrite: NEGATIVE
Protein, ur: 30 mg/dL — AB
Specific Gravity, Urine: 1.024 (ref 1.005–1.030)
pH: 5 (ref 5.0–8.0)

## 2021-08-12 LAB — COMPREHENSIVE METABOLIC PANEL
ALT: 20 U/L (ref 0–44)
AST: 19 U/L (ref 15–41)
Albumin: 4.7 g/dL (ref 3.5–5.0)
Alkaline Phosphatase: 56 U/L (ref 38–126)
Anion gap: 13 (ref 5–15)
BUN: 16 mg/dL (ref 6–20)
CO2: 15 mmol/L — ABNORMAL LOW (ref 22–32)
Calcium: 9.4 mg/dL (ref 8.9–10.3)
Chloride: 106 mmol/L (ref 98–111)
Creatinine, Ser: 0.71 mg/dL (ref 0.44–1.00)
GFR, Estimated: >60 mL/min (ref >60)
Glucose, Bld: 96 mg/dL (ref 70–99)
Potassium: 3.9 mmol/L (ref 3.5–5.1)
Sodium: 134 mmol/L — ABNORMAL LOW (ref 135–145)
Total Bilirubin: 1.2 mg/dL (ref 0.3–1.2)
Total Protein: 7.7 g/dL (ref 6.5–8.1)

## 2021-08-12 LAB — LIPASE, BLOOD: Lipase: 25 U/L (ref 11–51)

## 2021-08-12 LAB — PREGNANCY, URINE: Preg Test, Ur: NEGATIVE

## 2021-08-12 MED ORDER — ONDANSETRON 8 MG PO TBDP: ORAL_TABLET | ORAL | 0 refills | Status: DC

## 2021-08-12 MED ORDER — SODIUM CHLORIDE 0.9 % IV BOLUS
1000.0000 mL | Freq: Once | INTRAVENOUS | Status: AC
  Administered 2021-08-12: 1000 mL via INTRAVENOUS

## 2021-08-12 MED ORDER — KETOROLAC TROMETHAMINE 30 MG/ML IJ SOLN
30.0000 mg | Freq: Once | INTRAMUSCULAR | Status: AC
  Administered 2021-08-12: 30 mg via INTRAVENOUS
  Filled 2021-08-12: qty 1

## 2021-08-12 MED ORDER — ONDANSETRON HCL 4 MG/2ML IJ SOLN
4.0000 mg | Freq: Once | INTRAMUSCULAR | Status: AC
  Administered 2021-08-12: 4 mg via INTRAVENOUS
  Filled 2021-08-12: qty 2

## 2021-08-12 NOTE — Discharge Instructions (Signed)
Begin taking Zofran as prescribed as needed for nausea.  Clear liquid diet for the next 12 hours, then slowly advance to normal as tolerated.  Return to the emergency department if you develop severe abdominal pain, high fevers, bloody stools, or other new and concerning symptoms.

## 2021-08-12 NOTE — ED Notes (Signed)
Has not went to restroom since before being pulled into tx room.

## 2021-08-12 NOTE — ED Provider Notes (Signed)
[facility] EMERGENCY DEPARTMENT Provider Note   CSN: [id number] Arrival date & time: 08/11/21  2112     History  Chief Complaint  Patient presents with   Diarrhea    NADRA HRITZ is a 36 y.o. female.  Patient is a 36 year old female with past medical history of congestive heart failure, chronic chest pain, anxiety.  Patient presenting today for evaluation of nausea, vomiting, and diarrhea.  This started this morning as she was getting her kids ready for school.  She describes several episodes of nonbloody, nonbilious vomiting and diarrhea, but denies abdominal pain.  She denies fevers or chills.  She denies ill contacts or having consumed suspicious foods.  Patient well-known to the emergency department for frequent visits, most involving chest pain.  The history is provided by the patient.      Home Medications Prior to Admission medications   Medication Sig Start Date End Date Taking? Authorizing Provider  diclofenac Sodium (VOLTAREN) 1 % GEL Apply 4 g topically 4 (four) times daily. 02/23/21   Hoy Register, MD  etonogestrel (NEXPLANON) 68 MG IMPL implant Inject 1 each into the skin continuous.    [provider]  Iron, Ferrous Sulfate, 325 (65 Fe) MG TABS Take 325 mg by mouth daily. 04/09/21   Anders Simmonds, PA-C  meclizine (ANTIVERT) 12.5 MG tablet Take 12.5 mg by mouth as needed for dizziness.    [provider]  metoprolol succinate (TOPROL XL) 25 MG 24 hr tablet Take 0.5 tablets (12.5 mg total) by mouth at bedtime. 04/09/21 04/09/22  Anders Simmonds, PA-C  omeprazole (PRILOSEC) 20 MG capsule Take 1 capsule (20 mg total) by mouth daily. 05/04/21 08/06/22  Jacklynn Ganong, FNP  potassium chloride (KLOR-CON) 10 MEQ tablet Take 2 tablets (20 mEq total) by mouth daily. 04/09/21   Anders Simmonds, PA-C  sertraline (ZOLOFT) 50 MG tablet Take 1.5 tablets (75 mg total) by mouth daily. 05/04/21   [place], Anderson Malta, FNP  Vitamin D, Ergocalciferol,  (DRISDOL) 1.25 MG (50000 UNIT) CAPS capsule Take 50,000 Units by mouth once a week. 04/30/21   [provider]      Allergies    Fentanyl    Review of Systems   Review of Systems  All other systems reviewed and are negative.  Physical Exam Updated Vital Signs BP 106/83    Pulse (!) 126    Temp 98 F (36.7 C) (Oral)    Resp 19    Ht 5\' 9"  (1.753 m)    Wt 59.1 kg    SpO2 99%    BMI 19.23 kg/m  Physical Exam Vitals and nursing note reviewed.  Constitutional:      General: She is not in acute distress.    Appearance: She is well-developed. She is not diaphoretic.  HENT:     Head: Normocephalic and atraumatic.  Cardiovascular:     Rate and Rhythm: Normal rate and regular rhythm.     Heart sounds: No murmur heard.   No friction rub. No gallop.  Pulmonary:     Effort: Pulmonary effort is normal. No respiratory distress.     Breath sounds: Normal breath sounds. No wheezing.  Abdominal:     General: Bowel sounds are normal. There is no distension.     Palpations: Abdomen is soft.     Tenderness: There is no abdominal tenderness.  Musculoskeletal:        General: Normal range of motion.     Cervical back: Normal range  of motion and neck supple.  Skin:    General: Skin is warm and dry.  Neurological:     General: No focal deficit present.     Mental Status: She is alert and oriented to person, place, and time.    ED Results / Procedures / Treatments   Labs (all labs ordered are listed, but only abnormal results are displayed) Labs Reviewed  COMPREHENSIVE METABOLIC PANEL  LIPASE, BLOOD  CBC WITH DIFFERENTIAL/PLATELET  PREGNANCY, URINE  URINALYSIS, ROUTINE W REFLEX MICROSCOPIC    EKG None  Radiology No results found.  Procedures Procedures    Medications Ordered in ED Medications  ketorolac (TORADOL) 30 MG/ML injection 30 mg (has no administration in time range)  sodium chloride 0.9 % bolus 1,000 mL (has no administration in time range)  ondansetron  (ZOFRAN) injection 4 mg (has no administration in time range)    ED Course/ Medical Decision Making/ A&P  Patient presenting here with complaints of nausea, vomiting, and diarrhea that started this morning.  Vital signs are stable and physical examination is unremarkable.  Patient's laboratory studies are somewhat consistent with dehydration.  Patient hydrated with 1 L of normal saline and given medication for pain and nausea.  She is feeling significantly improved.  At this point, I feel as though she can safely be discharged with ODT Zofran, clear liquids, and as needed return.  I see no indication for further testing or imaging studies as I highly suspect that this is viral in nature.  Final Clinical Impression(s) / ED Diagnoses Final diagnoses:  None    Rx / DC Orders ED Discharge Orders     None         Geoffery Lyons, MD 08/12/21 [phone]

## 2021-09-14 ENCOUNTER — Other Ambulatory Visit: Payer: Self-pay | Admitting: Physician Assistant

## 2021-09-14 DIAGNOSIS — E876 Hypokalemia: Secondary | ICD-10-CM

## 2021-09-16 NOTE — Telephone Encounter (Signed)
Requested medications are due for refill today.  yes ? ?Requested medications are on the active medications list.  yes ? ?Last refill. 04/09/2021 #60 2 refills ? ?Future visit scheduled.   no ? ?Notes to clinic.  PCP listed as [facility] ? ? ? ?Requested Prescriptions  ?Pending Prescriptions Disp Refills  ? potassium chloride (KLOR-CON) 10 MEQ tablet [Pharmacy Med Name: POTASSIUM CL ER 10 MEQ TABLET] 60 tablet 0  ?  Sig: TAKE 2 TABLETS BY MOUTH DAILY.  ?  ? Endocrinology:  Minerals - Potassium Supplementation Passed - 09/14/2021  4:40 PM  ?  ?  Passed - K in normal range and within 360 days  ?  Potassium  ?Date Value Ref Range Status  ?08/12/2021 3.9 3.5 - 5.1 mmol/L Final  ?  ?  ?  ?  Passed - Cr in normal range and within 360 days  ?  Creatinine, Ser  ?Date Value Ref Range Status  ?08/12/2021 0.71 0.44 - 1.00 mg/dL Final  ? ?Creatinine,U  ?Date Value Ref Range Status  ?01/08/2013 233.6 mg/dL Final  ?  Comment:  ?    ?Cutoff Values for Urine Drug Screen: ?        Drug Class           Cutoff (ng/mL) ?        Amphetamines            1000 ?        Barbiturates             200 ?        Cocaine Metabolites      300 ?        Benzodiazepines          200 ?        Methadone                300 ?        Opiates                 2000 ?        Phencyclidine             25 ?        Propoxyphene             300 ?        Marijuana Metabolites     50 ?  ?For medical purposes only.  ?  ?  ?  ?  Passed - Valid encounter within last 12 months  ?  Recent Outpatient Visits   ? ?      ? 5 months ago Chronic systolic CHF (congestive heart failure) ([place])  ? [place], [place], [place]  ? 10 months ago Screening for diabetes mellitus  ? [place], Charlane Ferretti, [place]  ? 1 year ago Chronic systolic CHF (congestive heart failure) ([place])  ? [place] Charlott Rakes, MD  ? ?  ?  ? ?  ?  ?  ?  ?

## 2021-09-30 ENCOUNTER — Telehealth: Payer: Self-pay | Admitting: Licensed Clinical Social Worker

## 2021-09-30 NOTE — Telephone Encounter (Signed)
Reminder regarding Medicaid re-certification process was mailed to pt, encouraging her to ensure that an up to date phone number, address and email is registered with DSS so that she receives re-certification paperwork.  ?  ?Omeka Holben, MSW, LCSW ?Clinical Social Worker II ?[place] [facility]  ?[phone]- work cell phone (preferred) ?[phone]- desk phone ?

## 2021-10-10 ENCOUNTER — Other Ambulatory Visit: Payer: Self-pay | Admitting: Physician Assistant

## 2021-10-10 DIAGNOSIS — E876 Hypokalemia: Secondary | ICD-10-CM

## 2021-10-12 NOTE — Telephone Encounter (Signed)
Requested medication (s) are due for refill today: yes ? ?Requested medication (s) are on the active medication list: yes ? ?Last refill:  04/09/21 ? ?Future visit scheduled: no ? ?Notes to clinic:  please review if still pt of [facility] or not- Chart indicated [facility]. ? ? ?Requested Prescriptions  ?Pending Prescriptions Disp Refills  ? potassium chloride (KLOR-CON) 10 MEQ tablet [Pharmacy Med Name: POTASSIUM CL ER 10 MEQ TABLET] 60 tablet 0  ?  Sig: TAKE 2 TABLETS BY MOUTH DAILY.  ?  ? Endocrinology:  Minerals - Potassium Supplementation Passed - 10/10/2021  2:58 PM  ?  ?  Passed - K in normal range and within 360 days  ?  Potassium  ?Date Value Ref Range Status  ?08/12/2021 3.9 3.5 - 5.1 mmol/L Final  ?  ?  ?  ?  Passed - Cr in normal range and within 360 days  ?  Creatinine, Ser  ?Date Value Ref Range Status  ?08/12/2021 0.71 0.44 - 1.00 mg/dL Final  ? ?Creatinine,U  ?Date Value Ref Range Status  ?01/08/2013 233.6 mg/dL Final  ?  Comment:  ?    ?Cutoff Values for Urine Drug Screen: ?        Drug Class           Cutoff (ng/mL) ?        Amphetamines            1000 ?        Barbiturates             200 ?        Cocaine Metabolites      300 ?        Benzodiazepines          200 ?        Methadone                300 ?        Opiates                 2000 ?        Phencyclidine             25 ?        Propoxyphene             300 ?        Marijuana Metabolites     50 ?  ?For medical purposes only.  ?  ?  ?  ?  Passed - Valid encounter within last 12 months  ?  Recent Outpatient Visits   ? ?      ? 6 months ago Chronic systolic CHF (congestive heart failure) (HCC)  ? [facility] [place], [place], [place]  ? 10 months ago Screening for diabetes mellitus  ? [facility] [place], Odette Horns, MD  ? 1 year ago Chronic systolic CHF (congestive heart failure) (HCC)  ? [facility] Hoy Register, MD  ? ?  ?  ? ? ?  ?  ?  ? ? ? ? ?

## 2021-10-29 DIAGNOSIS — M79642 Pain in left hand: Secondary | ICD-10-CM | POA: Diagnosis not present

## 2021-10-29 DIAGNOSIS — M545 Low back pain, unspecified: Secondary | ICD-10-CM | POA: Diagnosis not present

## 2021-10-29 DIAGNOSIS — G894 Chronic pain syndrome: Secondary | ICD-10-CM | POA: Diagnosis not present

## 2021-10-29 DIAGNOSIS — M79641 Pain in right hand: Secondary | ICD-10-CM | POA: Diagnosis not present

## 2021-10-29 DIAGNOSIS — M130 Polyarthritis, unspecified: Secondary | ICD-10-CM | POA: Diagnosis not present

## 2021-11-19 ENCOUNTER — Other Ambulatory Visit (HOSPITAL_COMMUNITY): Payer: Self-pay | Admitting: Cardiology

## 2021-11-23 ENCOUNTER — Other Ambulatory Visit: Payer: Self-pay

## 2021-11-23 ENCOUNTER — Emergency Department (HOSPITAL_COMMUNITY)
Admission: EM | Admit: 2021-11-23 | Discharge: 2021-11-23 | Disposition: A | Discharge status: Home / Self Care | Payer: Medicaid Other | Attending: Emergency Medicine | Admitting: Emergency Medicine

## 2021-11-23 ENCOUNTER — Encounter (HOSPITAL_COMMUNITY): Payer: Self-pay | Admitting: *Deleted

## 2021-11-23 DIAGNOSIS — I110 Hypertensive heart disease with heart failure: Secondary | ICD-10-CM | POA: Insufficient documentation

## 2021-11-23 DIAGNOSIS — I509 Heart failure, unspecified: Secondary | ICD-10-CM | POA: Diagnosis not present

## 2021-11-23 DIAGNOSIS — Z79899 Other long term (current) drug therapy: Secondary | ICD-10-CM | POA: Insufficient documentation

## 2021-11-23 DIAGNOSIS — M79641 Pain in right hand: Secondary | ICD-10-CM | POA: Diagnosis not present

## 2021-11-23 MED ORDER — PREDNISONE 20 MG PO TABS: 40.0000 mg | ORAL_TABLET | Freq: Every day | ORAL | 0 refills | Status: DC

## 2021-11-23 MED ORDER — COLCHICINE 0.6 MG PO TABS: 0.6000 mg | ORAL_TABLET | Freq: Two times a day (BID) | ORAL | 0 refills | Status: DC

## 2021-11-23 NOTE — ED Triage Notes (Signed)
Right hand for 2 weeks, gradually getting worse. Denies any injury.  Pain worse in the mornings per pt.

## 2021-11-23 NOTE — Discharge Instructions (Signed)
Prednisone is a steroid that helps to reduce certain types of inflammation and may be used for allergic reactions, some rashes such as poison ivy or dermatitis, for asthma attacks or bronchitis and for certain types of pain.  Please take this medicine exactly as prescribed - 40mg  by mouth daily for 5 days.  This can have certain side effects with some people including feeling like you can't sleep, feeling anxious or feeling like you are on a "high".  It should not cause weight gain if only taken for a short time.  Please be aware that this medication may also cause an elevation in your blood sugar if you are a diabetic so if you are a diabetic you will need to keep a very close eye on your blood sugar, make sure that you are eating an extremely low level of carbohydrates and taking your medications exactly as prescribed.  If you should develop severe high blood sugar or start to feel poorly return to the emergency department immediately   Colchicine twice a day for 7 days  Emergency department for severe worsening pain fever or swelling, otherwise see Dr. or your family doctor within 1 week for recheck.

## 2021-11-23 NOTE — ED Provider Notes (Signed)
[facility] EMERGENCY DEPARTMENT Provider Note   CSN: [id number] Arrival date & time: 11/23/21  1034     History  Chief Complaint  Patient presents with   Hand Pain    Tina Fletcher is a 36 y.o. female.   Hand Pain   This patient is a 36 year old female, she has a history of congestive heart failure, she does not take any diuretics, she has some hypertension on metoprolol but denies having any diabetes, gout or chronic musculoskeletal or autoimmune conditions.  She reports that over the last couple of weeks she has had some pain in her right hand which is generally worse in the morning when she wakes up and seems to get better throughout the day.  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  She denies any other trauma, she denies swelling, she denies numbness or weakness.  She has not had any focal trauma nor has she ever had surgery on the hand.  She denies numbness.  This morning when she woke up the pain was a little more intense than it has been.  Home Medications Prior to Admission medications   Medication Sig Start Date End Date Taking? Authorizing Provider  colchicine 0.6 MG tablet Take 1 tablet (0.6 mg total) by mouth 2 (two) times daily for 7 days. 11/23/21 11/30/21 Yes Eber Hong, MD  predniSONE (DELTASONE) 20 MG tablet Take 2 tablets (40 mg total) by mouth daily. 11/23/21  Yes Eber Hong, MD  diclofenac Sodium (VOLTAREN) 1 % GEL Apply 4 g topically 4 (four) times daily. 02/23/21   Hoy Register, MD  etonogestrel (NEXPLANON) 68 MG IMPL implant Inject 1 each into the skin continuous.    [provider]  Iron, Ferrous Sulfate, 325 (65 Fe) MG TABS Take 325 mg by mouth daily.  04/09/21   Anders Simmonds, PA-C  meclizine (ANTIVERT) 12.5 MG tablet Take 12.5 mg by mouth as needed for dizziness.    [provider]  metoprolol succinate (TOPROL XL) 25 MG 24 hr tablet Take 0.5 tablets (12.5 mg total) by mouth at bedtime. 04/09/21 04/09/22  Anders Simmonds, PA-C  omeprazole (PRILOSEC) 20 MG capsule Take 1 capsule (20 mg total) by mouth daily. 05/04/21 08/06/22  Jacklynn Ganong, FNP  ondansetron (ZOFRAN-ODT) 8 MG disintegrating tablet 8mg  ODT q4 hours prn nausea 08/12/21   08/14/21, MD  potassium chloride (KLOR-CON) 10 MEQ tablet TAKE 2 TABLETS BY MOUTH DAILY. 10/12/21   10/14/21, MD  potassium chloride SA (KLOR-CON M) 20 MEQ tablet TAKE ONE TABLET BY MOUTH ONCE DAILY. 11/19/21   11/21/21, MD  sertraline (ZOLOFT) 50 MG tablet Take 1.5 tablets (75 mg total) by mouth daily. 05/04/21   [place], 13/7/22, FNP  Vitamin D, Ergocalciferol, (DRISDOL) 1.25 MG (50000 UNIT) CAPS capsule Take 50,000 Units by mouth once a week. 04/30/21   [provider]      Allergies    Fentanyl    Review of Systems   Review of Systems  Constitutional:  Negative for fever.  Skin:  Negative for wound.  Neurological:  Negative for weakness and numbness.   Physical Exam Updated Vital Signs BP (!) 124/91 (BP Location: Left Arm)  Pulse (!) 105   Temp 98.4 F (36.9 C) (Oral)   Resp 14   Ht 1.74 m (5' 8.5")   Wt 63.5 kg   SpO2 98%   BMI 20.98 kg/m  Physical Exam Vitals and nursing note reviewed.  Constitutional:      Appearance: She is well-developed. She is not diaphoretic.  HENT:     Head: Normocephalic and atraumatic.  Eyes:     General:        Right eye: No discharge.        Left eye: No discharge.     Conjunctiva/sclera: Conjunctivae normal.  Pulmonary:     Effort: Pulmonary effort is normal. No respiratory distress.  Musculoskeletal:     Comments: There is mild tenderness along the right hand over the thenar eminence.  She is able to flex  and extend the thumb but with tenderness.  She is able to hold it against resistance, there is no tenderness along the extensor or flexor tendons of the thumb.  There is no redness or warmth of the hand.  There is no tenderness in the wrist or the forearm.  She has some difficulty making a fist on the right with all of her fingers but is primarily the thumb.  Skin:    General: Skin is warm and dry.     Findings: No erythema or rash.  Neurological:     Mental Status: She is alert.     Coordination: Coordination normal.    ED Results / Procedures / Treatments   Labs (all labs ordered are listed, but only abnormal results are displayed) Labs Reviewed - No data to display  EKG None  Radiology No results found.  Procedures Procedures    Medications Ordered in ED Medications - No data to display  ED Course/ Medical Decision Making/ A&P                           Medical Decision Making  The patient does report that this is occasionally in the left hand as well but primarily on the right today.  This may be some type of underlying autoimmune process, this could be at type of inflammatory process that would benefit from an anti-inflammatory, she will get a short dose of prednisone and we will place her on colchicine for a week.  She can follow-up with orthopedics or family doctor.  The patient is agreeable.  This does not appear to be consistent with trauma or infection and does not appear to be consistent with a flexor tenosynovitis.        Final Clinical Impression(s) / ED Diagnoses Final diagnoses:  Right hand pain    Rx / DC Orders ED Discharge Orders          Ordered    predniSONE (DELTASONE) 20 MG tablet  Daily        11/23/21 1104    colchicine 0.6 MG tablet  2 times daily        11/23/21 1104              Eber Hong, MD 11/23/21 1105

## 2021-11-26 DIAGNOSIS — G894 Chronic pain syndrome: Secondary | ICD-10-CM | POA: Diagnosis not present

## 2021-12-24 DIAGNOSIS — G894 Chronic pain syndrome: Secondary | ICD-10-CM | POA: Diagnosis not present

## 2021-12-24 DIAGNOSIS — Z79891 Long term (current) use of opiate analgesic: Secondary | ICD-10-CM | POA: Diagnosis not present

## 2021-12-31 ENCOUNTER — Other Ambulatory Visit (HOSPITAL_COMMUNITY): Payer: Self-pay | Admitting: Family Medicine

## 2022-02-13 ENCOUNTER — Emergency Department (HOSPITAL_COMMUNITY)
Admission: EM | Admit: 2022-02-13 | Discharge: 2022-02-14 | Disposition: A | Discharge status: Home / Self Care | Payer: Medicaid Other | Attending: Emergency Medicine | Admitting: Emergency Medicine

## 2022-02-13 ENCOUNTER — Emergency Department (HOSPITAL_COMMUNITY): Payer: Medicaid Other

## 2022-02-13 ENCOUNTER — Encounter (HOSPITAL_COMMUNITY): Payer: Self-pay

## 2022-02-13 ENCOUNTER — Other Ambulatory Visit: Payer: Self-pay

## 2022-02-13 DIAGNOSIS — R079 Chest pain, unspecified: Secondary | ICD-10-CM | POA: Diagnosis not present

## 2022-02-13 DIAGNOSIS — Z79899 Other long term (current) drug therapy: Secondary | ICD-10-CM | POA: Insufficient documentation

## 2022-02-13 DIAGNOSIS — R55 Syncope and collapse: Secondary | ICD-10-CM | POA: Insufficient documentation

## 2022-02-13 DIAGNOSIS — R072 Precordial pain: Secondary | ICD-10-CM | POA: Diagnosis not present

## 2022-02-13 DIAGNOSIS — N9489 Other specified conditions associated with female genital organs and menstrual cycle: Secondary | ICD-10-CM | POA: Diagnosis not present

## 2022-02-13 DIAGNOSIS — R002 Palpitations: Secondary | ICD-10-CM | POA: Insufficient documentation

## 2022-02-13 NOTE — ED Triage Notes (Addendum)
Pt report chest pain that started about an hour ago with nausea, pain located center chest, no radiation. Reports oxycodone 5mg  prior to arrival, pt took zofran earlier that helped with nausea.

## 2022-02-14 LAB — CBC WITH DIFFERENTIAL/PLATELET
Abs Immature Granulocytes: 0.01 10*3/uL (ref 0.00–0.07)
Basophils Absolute: 0 10*3/uL (ref 0.0–0.1)
Basophils Relative: 1 %
Eosinophils Absolute: 0.2 10*3/uL (ref 0.0–0.5)
Eosinophils Relative: 5 %
HCT: 35.1 % — ABNORMAL LOW (ref 36.0–46.0)
Hemoglobin: 11.2 g/dL — ABNORMAL LOW (ref 12.0–15.0)
Immature Granulocytes: 0 %
Lymphocytes Relative: 36 %
Lymphs Abs: 1.6 10*3/uL (ref 0.7–4.0)
MCH: 27.6 pg (ref 26.0–34.0)
MCHC: 31.9 g/dL (ref 30.0–36.0)
MCV: 86.5 fL (ref 80.0–100.0)
Monocytes Absolute: 0.5 10*3/uL (ref 0.1–1.0)
Monocytes Relative: 11 %
Neutro Abs: 2.1 10*3/uL (ref 1.7–7.7)
Neutrophils Relative %: 47 %
Platelets: 251 10*3/uL (ref 150–400)
RBC: 4.06 MIL/uL (ref 3.87–5.11)
RDW: 12.1 % (ref 11.5–15.5)
WBC: 4.4 10*3/uL (ref 4.0–10.5)
nRBC: 0 % (ref 0.0–0.2)

## 2022-02-14 LAB — BASIC METABOLIC PANEL
Anion gap: 6 (ref 5–15)
BUN: 11 mg/dL (ref 6–20)
CO2: 26 mmol/L (ref 22–32)
Calcium: 9.1 mg/dL (ref 8.9–10.3)
Chloride: 105 mmol/L (ref 98–111)
Creatinine, Ser: 0.74 mg/dL (ref 0.44–1.00)
GFR, Estimated: >60 mL/min (ref >60)
Glucose, Bld: 109 mg/dL — ABNORMAL HIGH (ref 70–99)
Potassium: 3.6 mmol/L (ref 3.5–5.1)
Sodium: 137 mmol/L (ref 135–145)

## 2022-02-14 LAB — TROPONIN I (HIGH SENSITIVITY)
Troponin I (High Sensitivity): 3 ng/L (ref <18)
Troponin I (High Sensitivity): 5 ng/L (ref <18)

## 2022-02-14 LAB — HCG, QUANTITATIVE, PREGNANCY: hCG, Beta Chain, Quant, S: <1 m[IU]/mL (ref <5)

## 2022-02-14 NOTE — Discharge Instructions (Addendum)

## 2022-02-14 NOTE — ED Provider Notes (Signed)
[facility] EMERGENCY DEPARTMENT Provider Note   CSN: [id number] Arrival date & time: 02/13/22  2322     History  Chief Complaint  Patient presents with   Chest Pain    Tina Fletcher is a 36 y.o. female.  The history is provided by the patient.  Chest Pain Associated symptoms: no fever   Patient presents with chest pain, palpitations and a syncopal episode.  Patient reports she was in her usual state of health when she ingested liquid marijuana.  She reports soon after she started having palpitations her chest became tight and she had a brief syncopal episode.  Family member is unable to tell me how long the episode lasted.  No injuries from the syncopal episode.  Patient now reports she is feeling improved.  She reports she had a similar reaction previously when she ate THC Gummies  No recent chest pain episodes.     Home Medications Prior to Admission medications   Medication Sig Start Date End Date Taking? Authorizing Provider  colchicine 0.6 MG tablet Take 1 tablet (0.6 mg total) by mouth 2 (two) times daily for 7 days. 11/23/21 11/30/21  Eber Hong, MD  diclofenac Sodium (VOLTAREN) 1 % GEL Apply 4 g topically 4 (four) times daily. 02/23/21   Hoy Register, MD  etonogestrel (NEXPLANON) 68 MG IMPL implant Inject 1 each into the skin continuous.    [provider]  Iron, Ferrous Sulfate, 325 (65 Fe) MG TABS Take 325 mg by mouth daily. 04/09/21   Anders Simmonds, PA-C  meclizine (ANTIVERT) 12.5 MG tablet Take 12.5 mg by mouth as needed for dizziness.    [provider]  metoprolol succinate (TOPROL XL) 25 MG 24 hr tablet Take 0.5 tablets (12.5 mg total) by mouth at bedtime. 04/09/21 04/09/22  Anders Simmonds, PA-C  omeprazole (PRILOSEC) 20 MG capsule Take 1 capsule (20 mg total) by mouth daily. 05/04/21 08/06/22  Jacklynn Ganong, FNP  ondansetron (ZOFRAN-ODT) 8 MG disintegrating tablet 8mg  ODT q4 hours prn nausea 08/12/21   08/14/21, MD   potassium chloride (KLOR-CON) 10 MEQ tablet TAKE 2 TABLETS BY MOUTH DAILY. 10/12/21   10/14/21, MD  potassium chloride SA (KLOR-CON M) 20 MEQ tablet TAKE ONE TABLET BY MOUTH ONCE DAILY. 11/19/21   11/21/21, MD  predniSONE (DELTASONE) 20 MG tablet Take 2 tablets (40 mg total) by mouth daily. 11/23/21   11/25/21, MD  sertraline (ZOLOFT) 50 MG tablet Take 1.5 tablets (75 mg total) by mouth daily. 05/04/21   [place], 13/7/22, FNP  Vitamin D, Ergocalciferol, (DRISDOL) 1.25 MG (50000 UNIT) CAPS capsule Take 50,000 Units by mouth once a week. 04/30/21   [provider]      Allergies    Fentanyl    Review of Systems   Review of Systems  Constitutional:  Negative for fever.  Cardiovascular:  Positive for chest pain.  Neurological:  Positive for syncope.    Physical Exam Updated Vital Signs BP 119/81   Pulse 87   Temp 98.2 F (36.8 C) (Oral)   Resp 14   Ht 1.753 m (5\' 9" )   Wt 62.6 kg   SpO2 99%   BMI 20.38 kg/m  Physical Exam CONSTITUTIONAL: Well developed/well nourished HEAD: Normocephalic/atraumatic EYES: EOMI ENMT: Mucous membranes moist NECK: supple no meningeal signs CV: S1/S2 noted, no murmurs/rubs/gallops noted LUNGS: Lungs are clear to auscultation bilaterally, no apparent distress ABDOMEN: soft, nontender NEURO: Pt is awake/alert/appropriate, moves all extremitiesx4.  No  facial droop.   EXTREMITIES: pulses normal/equal, full ROM, no lower extremity edema or tenderness SKIN: warm, color normal PSYCH: no abnormalities of mood noted, alert and oriented to situation  ED Results / Procedures / Treatments   Labs (all labs ordered are listed, but only abnormal results are displayed) Labs Reviewed  CBC WITH DIFFERENTIAL/PLATELET - Abnormal; Notable for the following components:      Result Value   Hemoglobin 11.2 (*)    HCT 35.1 (*)    All other components within normal limits  BASIC METABOLIC PANEL - Abnormal; Notable for the following  components:   Glucose, Bld 109 (*)    All other components within normal limits  HCG, QUANTITATIVE, PREGNANCY  TROPONIN I (HIGH SENSITIVITY)  TROPONIN I (HIGH SENSITIVITY)    EKG EKG Interpretation  Date/Time:  Saturday February 13 2022 23:45:51 EDT Ventricular Rate:  105 PR Interval:  180 QRS Duration: 79 QT Interval:  363 QTC Calculation: 480 R Axis:   59 Text Interpretation: Sinus tachycardia Probable left atrial enlargement Borderline T wave abnormalities Borderline prolonged QT interval No significant change since last tracing Confirmed by Zadie Rhine ([id number]) on 02/13/2022 11:49:45 PM  Radiology DG Chest Port 1 View  Result Date: 02/14/2022 CLINICAL DATA:  Chest pain. EXAM: PORTABLE CHEST 1 VIEW COMPARISON:  Chest radiograph 06/20/2021 FINDINGS: Mild patient rotation.The cardiomediastinal contours are normal. The lungs are clear. Pulmonary vasculature is normal. No consolidation, pleural effusion, or pneumothorax. No acute osseous abnormalities are seen. IMPRESSION: No acute chest findings. Electronically Signed   By: Narda Rutherford M.D.   On: 02/14/2022 00:00    Procedures Procedures    Medications Ordered in ED Medications - No data to display  ED Course/ Medical Decision Making/ A&P Clinical Course as of 02/14/22 0232  Sun Feb 14, 2022  0232 Labs were overall reassuring.  Patient was advised not to utilize any substances as she keeps having reaction to them causing chest pain and palpitations.  Vitals are overall appropriate.  She is in no acute distress.  She is safe for discharge home [DW]    Clinical Course User Index [DW] Zadie Rhine, MD                           Medical Decision Making Amount and/or Complexity of Data Reviewed Labs: ordered. Radiology: ordered.   This patient presents to the ED for concern of chest pain, this involves an extensive number of treatment options, and is a complaint that carries with it a high risk of complications and  morbidity.  The differential diagnosis includes but is not limited to acute coronary syndrome, aortic dissection, pulmonary embolism, pericarditis, pneumothorax, pneumonia, myocarditis, pleurisy, esophageal rupture    Comorbidities that complicate the patient evaluation: Patient's presentation is complicated by their history of previous history of cardiomyopathy  Social Determinants of Health: Patient's  substance use   increases the complexity of managing their presentation  Additional history obtained: Additional history obtained from family cousin at bedside Records reviewed previous admission documents previous cardiac cath in 2020 was negative  Lab Tests: I Ordered, and personally interpreted labs.  The pertinent results include: Labs overall unremarkable  Imaging Studies ordered: I ordered imaging studies including X-ray chest   I independently visualized and interpreted imaging which showed no acute findings I agree with the radiologist interpretation  Cardiac Monitoring: The patient was maintained on a cardiac monitor.  I personally viewed and interpreted the cardiac monitor which showed  an underlying rhythm of:  sinus rhythm  Test Considered: Patient with previous negative cardiac cath.  No further cardiac work-up warranted.  Low suspicion for acute PE since this occurred after ingesting liquid marijuana   Reevaluation: After the interventions noted above, I reevaluated the patient and found that they have :improved  Complexity of problems addressed: Patient's presentation is most consistent with  acute presentation with potential threat to life or bodily function  Disposition: After consideration of the diagnostic results and the patient's response to treatment,  I feel that the patent would benefit from discharge   .           Final Clinical Impression(s) / ED Diagnoses Final diagnoses:  Precordial pain  Palpitations    Rx / DC Orders ED Discharge  Orders     None         Zadie Rhine, MD 02/14/22 [phone]

## 2022-02-17 ENCOUNTER — Other Ambulatory Visit (HOSPITAL_COMMUNITY): Payer: Self-pay | Admitting: Family Medicine

## 2022-02-18 DIAGNOSIS — G894 Chronic pain syndrome: Secondary | ICD-10-CM | POA: Diagnosis not present

## 2022-03-03 IMAGING — DX DG CHEST 2V
2 series · 2 of 2 positions shown · non-contrast
Comparison: 06/03/2021

CLINICAL DATA: Chest pain

EXAM:
CHEST - 2 VIEW

[chest pa]
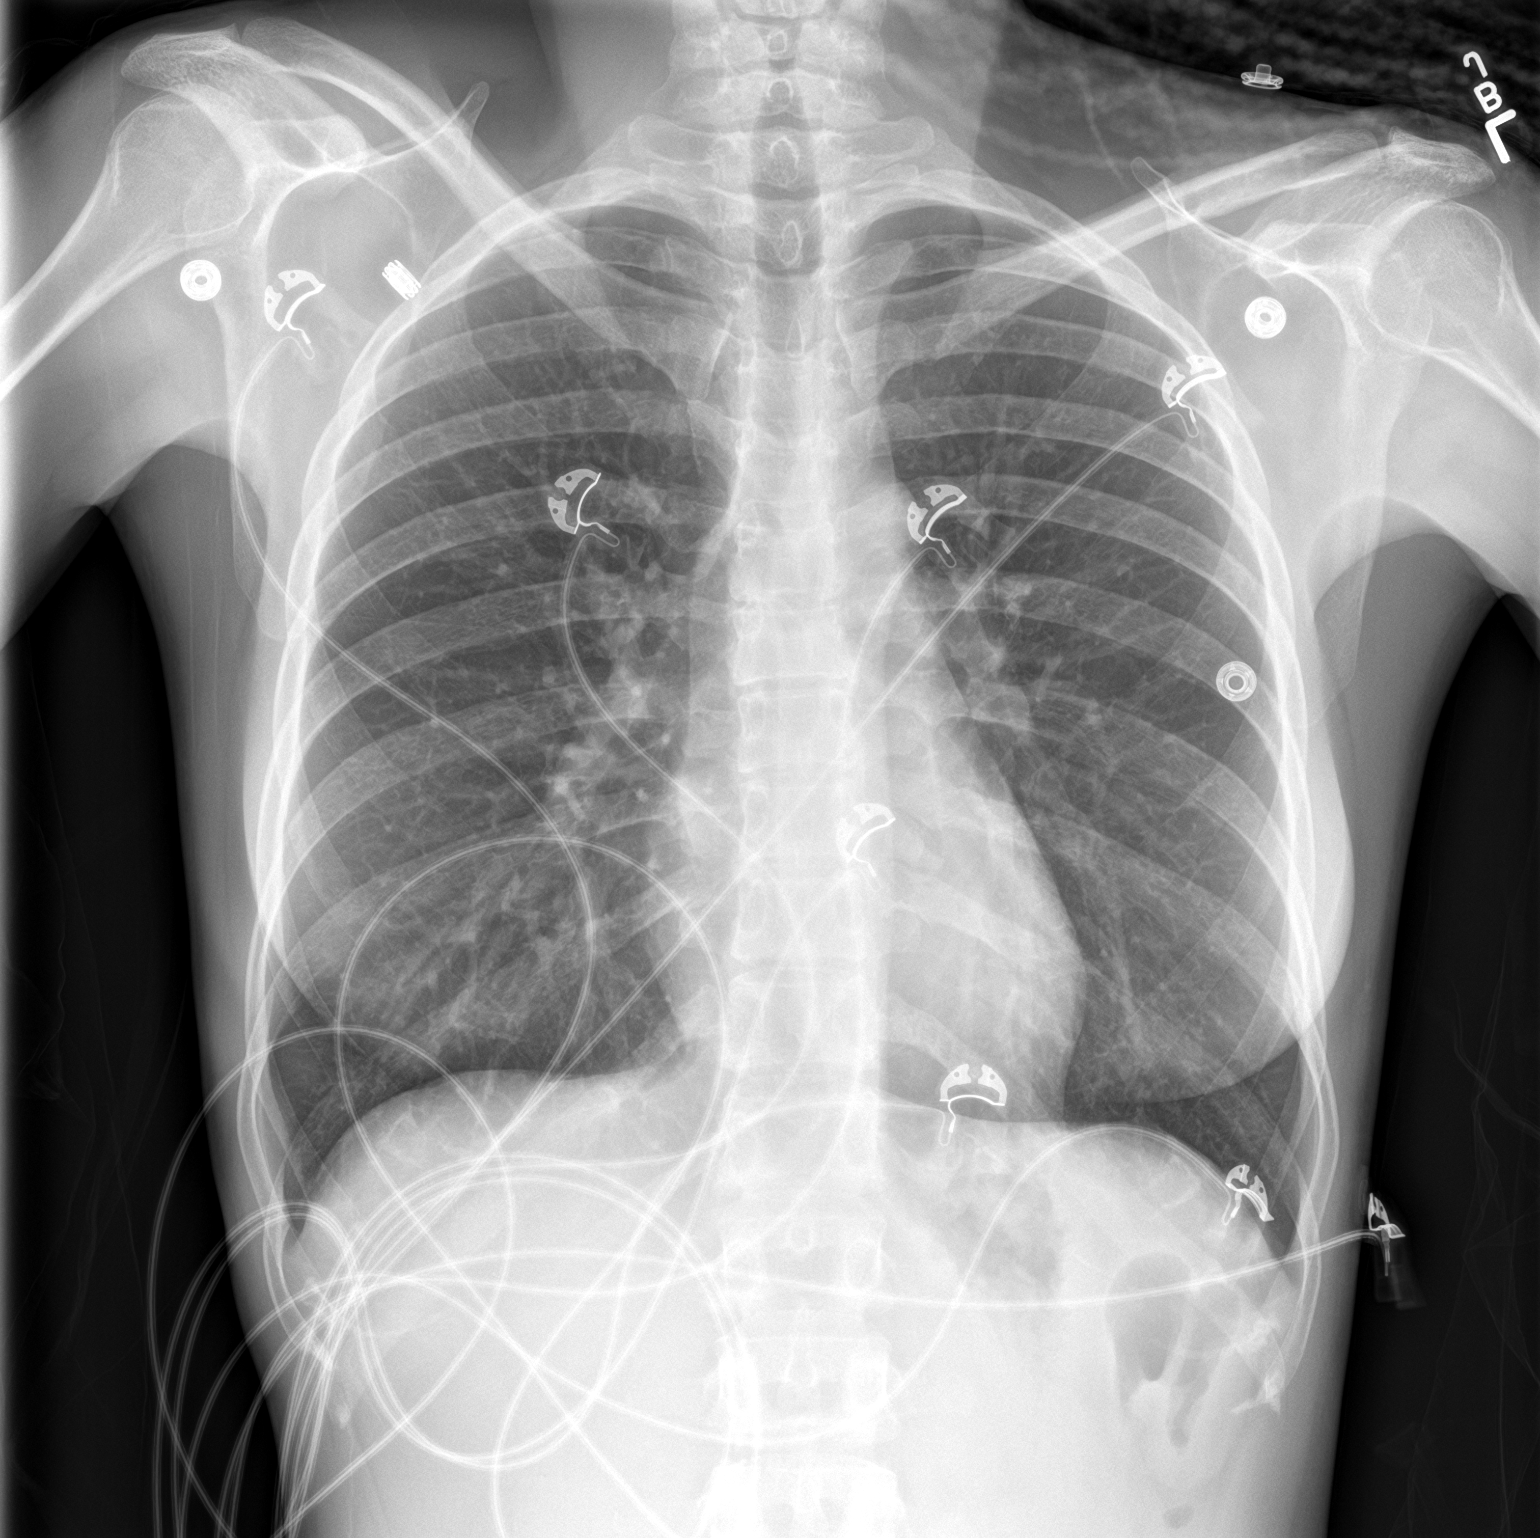

[chest lat]
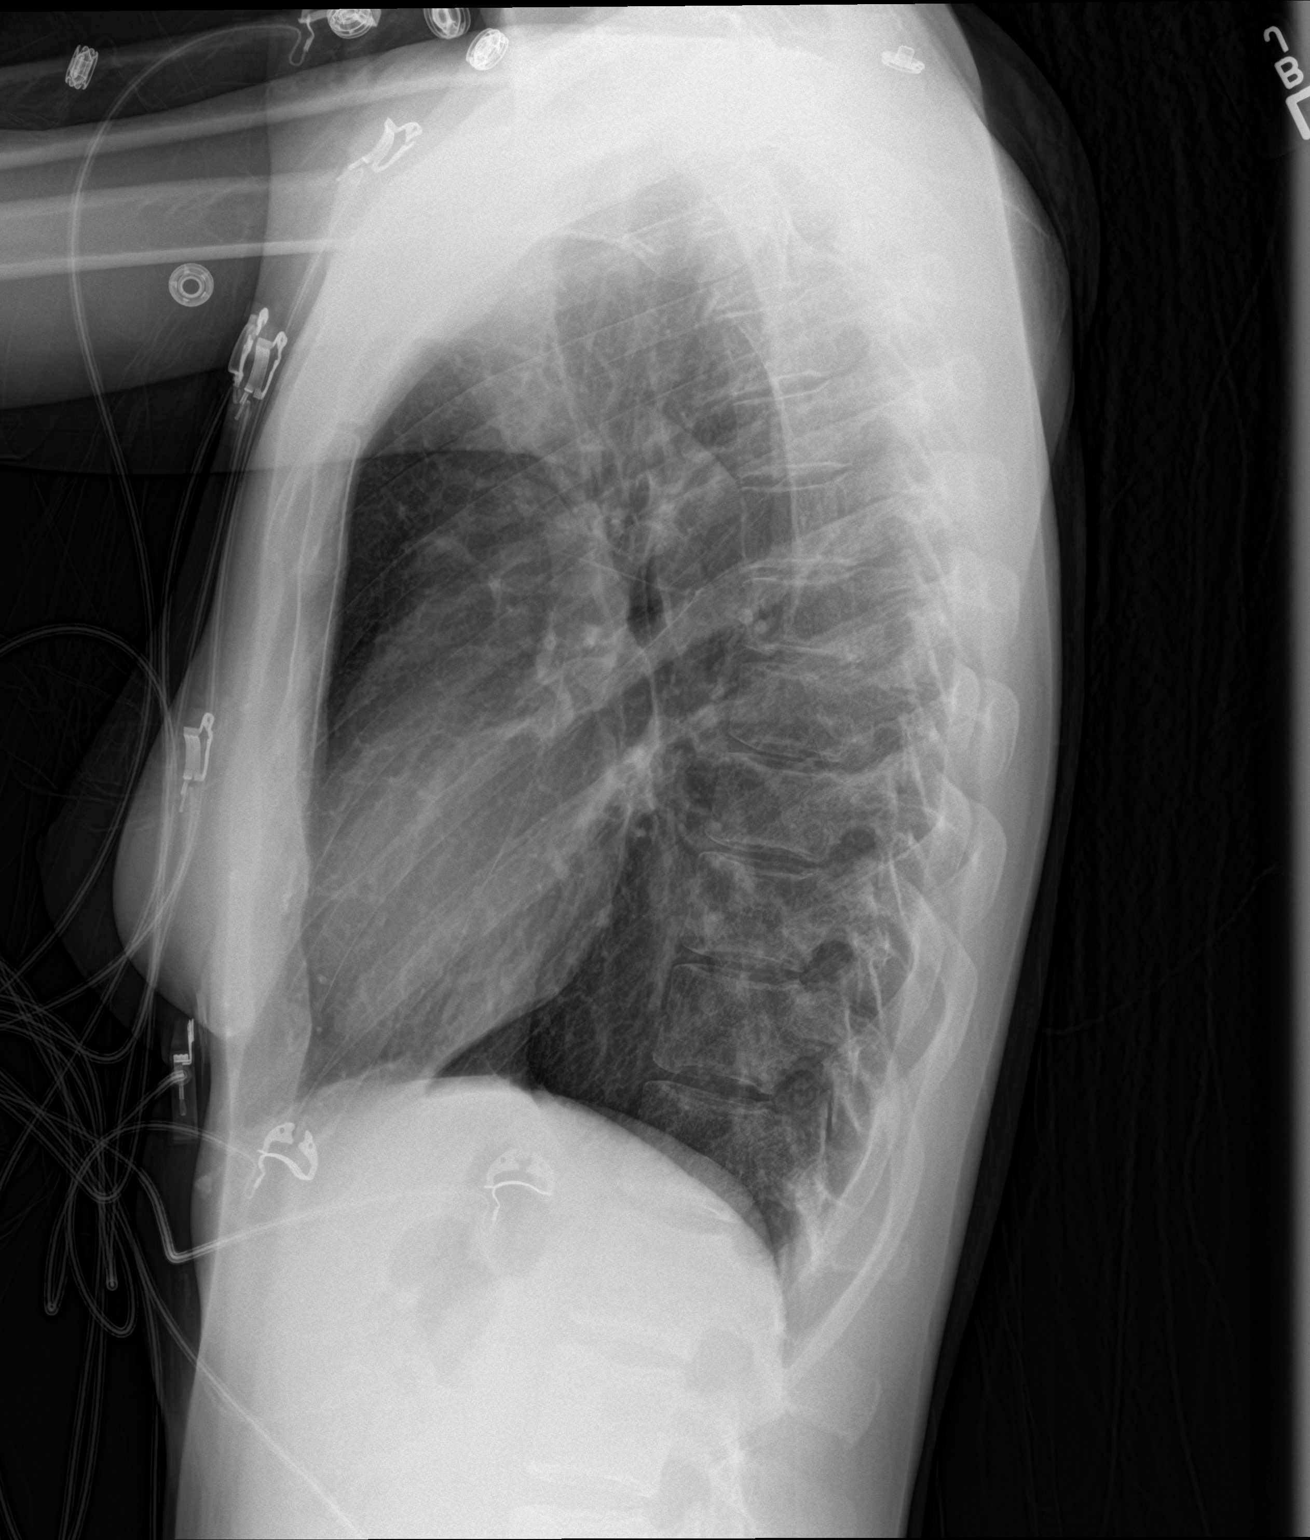

[2 of 2 positions shown; findings below may reference images not displayed]

FINDINGS: The lungs are clear without focal pneumonia, edema, pneumothorax or
pleural effusion. The cardiopericardial silhouette is within normal
limits for size. The visualized bony structures of the thorax show
no acute abnormality. Telemetry leads overlie the chest.
IMPRESSION: No active cardiopulmonary disease.

## 2022-03-04 ENCOUNTER — Telehealth (HOSPITAL_COMMUNITY): Payer: Self-pay

## 2022-03-04 NOTE — Telephone Encounter (Signed)
Called to confirm/remind patient of their appointment at [facility] on 03/08/22.   Patient reminded to bring all medications and/or complete list.  Confirmed patient has transportation. Gave directions, instructed to utilize valet parking.  Confirmed appointment prior to ending call.   

## 2022-03-08 ENCOUNTER — Ambulatory Visit (HOSPITAL_COMMUNITY)
Admission: RE | Admit: 2022-03-08 | Discharge: 2022-03-08 | Disposition: A | Discharge status: Home / Self Care | Payer: Medicaid Other | Source: Ambulatory Visit | Attending: Family Medicine | Admitting: Family Medicine

## 2022-03-08 ENCOUNTER — Encounter (HOSPITAL_COMMUNITY): Payer: Self-pay

## 2022-03-08 VITALS — BP 122/86 | HR 95 | Ht 68.5 in | Wt 134.2 lb

## 2022-03-08 DIAGNOSIS — I110 Hypertensive heart disease with heart failure: Secondary | ICD-10-CM | POA: Diagnosis not present

## 2022-03-08 DIAGNOSIS — R9431 Abnormal electrocardiogram [ECG] [EKG]: Secondary | ICD-10-CM | POA: Diagnosis not present

## 2022-03-08 DIAGNOSIS — R55 Syncope and collapse: Secondary | ICD-10-CM | POA: Insufficient documentation

## 2022-03-08 DIAGNOSIS — F1011 Alcohol abuse, in remission: Secondary | ICD-10-CM | POA: Insufficient documentation

## 2022-03-08 DIAGNOSIS — R002 Palpitations: Secondary | ICD-10-CM | POA: Insufficient documentation

## 2022-03-08 DIAGNOSIS — I252 Old myocardial infarction: Secondary | ICD-10-CM | POA: Insufficient documentation

## 2022-03-08 DIAGNOSIS — Z79899 Other long term (current) drug therapy: Secondary | ICD-10-CM | POA: Insufficient documentation

## 2022-03-08 DIAGNOSIS — G90A Postural orthostatic tachycardia syndrome (POTS): Secondary | ICD-10-CM | POA: Diagnosis not present

## 2022-03-08 DIAGNOSIS — I10 Essential (primary) hypertension: Secondary | ICD-10-CM

## 2022-03-08 DIAGNOSIS — F419 Anxiety disorder, unspecified: Secondary | ICD-10-CM

## 2022-03-08 DIAGNOSIS — I428 Other cardiomyopathies: Secondary | ICD-10-CM | POA: Diagnosis not present

## 2022-03-08 DIAGNOSIS — I5022 Chronic systolic (congestive) heart failure: Secondary | ICD-10-CM | POA: Diagnosis not present

## 2022-03-08 DIAGNOSIS — F411 Generalized anxiety disorder: Secondary | ICD-10-CM | POA: Insufficient documentation

## 2022-03-08 NOTE — Patient Instructions (Signed)
Thank you for coming in today  No labs   Your physician recommends that you schedule a follow-up appointment in:  6 months with Dr. Shirlee Latch call in December to make March 2024 appointment     Do the following things EVERYDAY: Weigh yourself in the morning before breakfast. Write it down and keep it in a log. Take your medicines as prescribed Eat low salt foods--Limit salt (sodium) to 2000 mg per day.  Stay as active as you can everyday Limit all fluids for the day to less than 2 liters  At the [facility], you and your health needs are our priority. As part of our continuing mission to provide you with exceptional heart care, we have created designated Provider Care Teams. These Care Teams include your primary Cardiologist (physician) and Advanced Practice Providers (APPs- Physician Assistants and Nurse Practitioners) who all work together to provide you with the care you need, when you need it.   You may see any of the following providers on your designated Care Team at your next follow up: Dr Arvilla Meres Dr Marca Ancona Dr. Marcos Eke, NP Robbie Lis, [place] [facility] [place], [place] Brynda Peon, NP Karle Plumber, PharmD   Please be sure to bring in all your medications bottles to every appointment.   If you have any questions or concerns before your next appointment please send [place] a message through [place] or call our office at [phone].    TO LEAVE A MESSAGE FOR THE NURSE SELECT OPTION 2, PLEASE LEAVE A MESSAGE INCLUDING: YOUR NAME DATE OF BIRTH CALL BACK NUMBER REASON FOR CALL**this is important as we prioritize the call backs  YOU WILL RECEIVE A CALL BACK THE SAME DAY AS LONG AS YOU CALL BEFORE 4:00 PM

## 2022-03-08 NOTE — Progress Notes (Signed)
Advanced Heart Failure Clinic Note   PCP: [facility] HF Cardiologist: Dr. Shirlee Latch    HPI: Tina Fletcher is a 36 y.o. female with past history of HTN and new systolic heart failure.  Admitted 11/20 with new acute systolic heart failure. Echo showed new onset systolic HF with EF 15% and decreased RV systolic function. She had a history of increased alcohol use over the several months prior with 7-8 airplane bottles of liquor 3-4 days per week. She was diuresed with IV lasix. R/LHC showed no significant coronary artery disease and RHC was within normal limits. Her cardiomyopathy is possibly secondary to recent increased alcohol use or uncontrolled HTN. Viral myocarditis was felt to be a possible etiology, however cMRI showed no myocardial LGE, so no definitive evidence for prior MI, infiltrative disease, or myocarditis.  Also of note, she has no family history of cardiomyopathy, negative HIV, and TSH was normal.   Echo in 3/21 showed EF 30-35%, global hypokinesis, mild RV dilation with normal function, mild MR.   On 06/06/20, she had a CBD gummy with a high dosage of CBD in it.  Shortly afterwards, she became lightheaded and dizzy and actually passed out.  She had been out of her meds for several days and her BP was elevated when she went to the ER.  HS-TnI and D dimer were normal. She was discharged home.   Echo in 1/22 showed EF up to 40-45%. She wore a Zio patch in 1/22 with no significant events but only kept it on for about 4 days.   In 2/22, she was admitted with hypertensive urgency with syncope.  BP 218/110.  She had not been taking her evening BP med doses.  BP was controlled and she was discharged.  Later in 2/22, she went to the ER with chest pain.  Troponin was negative but BP was high.  She was sent home.   Evaluated in clinic, 08/22/20 and was hypertensive. Also very anxious. EKG showed NSR w/ mildly prolonged QTc 474 ms. Labs reviewed and showed hypokalemia.  KCl 20 daily was added. Amlodipine 5 mg daily also added. I also started sertraline 25 mg daily for marked anxiety.   Had return clinic f/u on 3/1. Just prior to that had been seen in the ED on 2/27 for continued anxiety and palpitations. She reported the ED physician gave her something for possible withdrawals, which had helped significantly. She had quit drinking 8 days prior.   She was evaluated 11 times at different EDs for chest pain.  No cardiac findings. Felt to be due to anxiety.   Evaluated in ED 11/19/20, 12/02/20, and 12/15/20 for CP. Cardiac work up unremarkable, EKGs reassuring. Felt to be due to anxiety and possibly GERD.  She had 2 more ER visits with chest pain thought to be related to anxiety in 12/22.   Echo 2/23 showed EF up to 50-55%, normal diastolic function, normal strain pattern, normal RV.   She returns for followup of CHF. She is doing better.  Anxiety less prominent on sertraline.  She has not had ETOH for > 1 year.  No lightheadedness.  No exertional dyspnea or chest pain.  No orthopnea/PND.   Today she returns for HF follow up with her son. Overall feeling fine. She is not short of breath with activity. She continues to feel occasional palpitations that are not bothersome to her. Denies abnormal bleeding, CP, dizziness, edema, or PND/Orthopnea. Appetite ok. No fever or chills. Weight at home  135 pounds. Taking all medications. She is 1 year and 7 months sober from ETOH.  ECG (personally reviewed from 02/15/22): ST, QRS 463 msec  Labs (11/20): K 3.8, creatinine 1.1 Labs (2/21): K 3.6, creatinine 0.89 Labs (12/21): BNP 145, K 3.7, creatinine 0.67, hgb 12.2 Labs (2/22): K 3.4, creatinine 0.79, hgb 11.5 Labs (2/22): K 3.5, Creatinine 0.79 Labs (3/22): K 3.8, Creatinine 0.76 Labs (5/22): K 3.2, creatinine 1.01 Labs (6/22): K 3.4 => 4.1, creatinine 0.85 => 0.82 Labs (12/22): K 4, creatinine 0.72 Labs (8/23): K 3.6, creatinine 0.74  PMH: 1. HTN - Renal artery dopplers  (3/21): No evidence for renal artery stenosis.  2. ETOH abuse: Has quit.  3. Chronic systolic CHF: Nonischemic cardiomyopathy.  - LHC/RHC (11/20): No significant CAD; mean RA 1, PA 24/8, mean PCWP 5, CI 3.54.  - Echo (11/20): EF 15%, diffuse hypokinesis, mildly decreased RV systolic function.  - CMRI (11/20): LV mildly dilated with EF 16%, normal RV size with EF 28%, no myocardial LGE.  - Echo (3/21): EF 30-35%, global hypokinesis, mild RV dilation with normal function, mild MR.  - Echo (1/22): EF 40-45%, normal RV.  - Echo (2/23): EF 50-55%, normal diastolic function, normal strain pattern, normal RV.  4. COVID-19 infection 9/21 5. Prolonged QT interval - Zio patch 3/22: No significant abnormalities.   Review of Systems: All systems reviewed and negative except as per HPI.   Current Outpatient Medications  Medication Sig Dispense Refill   etonogestrel (NEXPLANON) 68 MG IMPL implant Inject 1 each into the skin continuous.     Iron, Ferrous Sulfate, 325 (65 Fe) MG TABS Take 325 mg by mouth daily. 60 tablet 3   meclizine (ANTIVERT) 12.5 MG tablet Take 12.5 mg by mouth as needed for dizziness.     metoprolol succinate (TOPROL XL) 25 MG 24 hr tablet Take 0.5 tablets (12.5 mg total) by mouth at bedtime. 45 tablet 3   omeprazole (PRILOSEC) 20 MG capsule TAKE (1) CAPSULE BY MOUTH EVERY DAY. 30 capsule 11   ondansetron (ZOFRAN-ODT) 8 MG disintegrating tablet 8mg  ODT q4 hours prn nausea 10 tablet 0   potassium chloride SA (KLOR-CON M) 20 MEQ tablet TAKE ONE TABLET BY MOUTH ONCE DAILY. 90 tablet 0   sertraline (ZOLOFT) 50 MG tablet Take 1.5 tablets (75 mg total) by mouth daily. 45 tablet 5   Vitamin D, Ergocalciferol, (DRISDOL) 1.25 MG (50000 UNIT) CAPS capsule Take 50,000 Units by mouth once a week.     oxyCODONE (OXY IR/ROXICODONE) 5 MG immediate release tablet Take 5 mg by mouth 2 (two) times daily as needed.     No current facility-administered medications for this encounter.   Allergies   Allergen Reactions   Fentanyl Other (See Comments)    Headaches   Social History   Socioeconomic History   Marital status: Single    Spouse name: Not on file   Number of children: Not on file   Years of education: Not on file   Highest education level: Not on file  Occupational History   Not on file  Tobacco Use   Smoking status: Never   Smokeless tobacco: Never  Vaping Use   Vaping Use: Never used  Substance and Sexual Activity   Alcohol use: Not Currently   Drug use: No   Sexual activity: Yes    Birth control/protection: Implant  Other Topics Concern   Not on file  Social History Narrative   Not on file   Social Determinants of Health  Financial Resource Strain: Low Risk  (08/26/2020)   Overall Financial Resource Strain (CARDIA)    Difficulty of Paying Living Expenses: Not hard at all  Food Insecurity: No Food Insecurity (08/26/2020)   Hunger Vital Sign    Worried About Running Out of Food in the Last Year: Never true    Ran Out of Food in the Last Year: Never true  Transportation Needs: No Transportation Needs (08/26/2020)   PRAPARE - Administrator, Civil Service (Medical): No    Lack of Transportation (Non-Medical): No  Physical Activity: Inactive (08/26/2020)   Exercise Vital Sign    Days of Exercise per Week: 0 days    Minutes of Exercise per Session: 0 min  Stress: No Stress Concern Present (09/24/2019)   [employer] of Occupational Health - Occupational Stress Questionnaire    Feeling of Stress : Not at all  Social Connections: Moderately Integrated (09/24/2019)   Social Connection and Isolation Panel [NHANES]    Frequency of Communication with Friends and Family: More than three times a week    Frequency of Social Gatherings with Friends and Family: More than three times a week    Attends Religious Services: More than 4 times per year    Active Member of [employer] or Organizations: No    Attends Banker Meetings: Never    Marital  Status: Living with partner  Intimate Partner Violence: Not At Risk (09/24/2019)   Humiliation, Afraid, Rape, and Kick questionnaire    Fear of Current or Ex-Partner: No    Emotionally Abused: No    Physically Abused: No    Sexually Abused: No   Family History  Problem Relation Age of Onset   Diabetes Maternal Grandmother    Cancer Paternal Grandfather        prancreatic   COPD Maternal Grandfather    Cancer Maternal Grandfather        prostate   Asthma Maternal Grandfather    Asthma Son    Bronchitis Son    BP 122/86   Pulse 95   Ht 5' 8.5" (1.74 m)   Wt 60.9 kg (134 lb 3.2 oz)   SpO2 99%   BMI 20.11 kg/m   Wt Readings from Last 3 Encounters:  03/08/22 60.9 kg (134 lb 3.2 oz)  02/13/22 62.6 kg (138 lb)  11/23/21 63.5 kg (140 lb)   PHYSICAL EXAM: General:  NAD. No resp difficulty HEENT: Normal Neck: Supple. No JVD. Carotids 2+ bilat; no bruits. No lymphadenopathy or thryomegaly appreciated. Cor: PMI nondisplaced. Regular rate & rhythm. No rubs, gallops or murmurs. Lungs: Clear Abdomen: Soft, nontender, nondistended. No hepatosplenomegaly. No bruits or masses. Good bowel sounds. Extremities: No cyanosis, clubbing, rash, edema Neuro: Alert & oriented x 3, cranial nerves grossly intact. Moves all 4 extremities w/o difficulty. Affect pleasant.  ASSESSMENT & PLAN: 1. Chronic systolic CHF: Nonischemic cardiomyopathy, probably due to ETOH.  No FH cardiomyopathy, negative HIV and normal TSH, no recent pregnancy.  She had been drinking heavily for many months prior to diagnosis, ETOH may be the main cause of her cardiomyopathy.  LHC 04/2019 w/ no CAD, filling pressures optimized and preserved cardiac output. Cardiac MRI showed no myocardial LGE, so no definitive evidence for prior MI, infiltrative disease, or myocarditis. Echo in 3/21 showed EF 30-35%, echo in 1/22 with EF up to 40-45%, normal RV.  Echo 2/23 showed EF up to 50-55%.  I think that her EF has improved as a result of  staying off  ETOH (no ETOH x 1 year).   - Continue Toprol XL 12.5 mg qhs.  - No BP room for Entresto, spironolactone.  2. HTN: Well-controlled. Recent labs stable, SCr 0.74, K 3.6 - No evidence for fibromuscular dysplasia on renal artery dopplers.  3. ETOH abuse: She reports she has quit ETOH since 08/16/20. - Congratulated on abstinence. 4. 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 QTc 463 msec on ECG 02/15/22.   - She is on beta blocker.  5. Anxiety: Marked generalized anxiety.  Feels better on sertraline. - Follow up with PCP.  6. Palpitations, Syncope/ Orthostatic Hypotension: ? POTS in this young female. Saw Dr Graciela Husbands and he felt this was anxiety.  Zio patch in 3/22 with no arrhythmias. Minimal palpitations now.  - Continue Toprol XL.   Follow up in 6 months with Dr. Shirlee Latch.   Anderson Malta [place], FNP 03/08/22

## 2022-03-16 ENCOUNTER — Encounter (HOSPITAL_COMMUNITY): Payer: Self-pay | Admitting: Emergency Medicine

## 2022-03-16 ENCOUNTER — Emergency Department (HOSPITAL_COMMUNITY)
Admission: EM | Admit: 2022-03-16 | Discharge: 2022-03-16 | Disposition: A | Discharge status: Home / Self Care | Payer: Medicaid Other | Attending: Emergency Medicine | Admitting: Emergency Medicine

## 2022-03-16 ENCOUNTER — Other Ambulatory Visit: Payer: Self-pay

## 2022-03-16 ENCOUNTER — Emergency Department (HOSPITAL_COMMUNITY): Payer: Medicaid Other

## 2022-03-16 DIAGNOSIS — D649 Anemia, unspecified: Secondary | ICD-10-CM | POA: Diagnosis not present

## 2022-03-16 DIAGNOSIS — R079 Chest pain, unspecified: Secondary | ICD-10-CM

## 2022-03-16 DIAGNOSIS — I110 Hypertensive heart disease with heart failure: Secondary | ICD-10-CM | POA: Diagnosis not present

## 2022-03-16 DIAGNOSIS — D72819 Decreased white blood cell count, unspecified: Secondary | ICD-10-CM | POA: Insufficient documentation

## 2022-03-16 DIAGNOSIS — I509 Heart failure, unspecified: Secondary | ICD-10-CM | POA: Insufficient documentation

## 2022-03-16 DIAGNOSIS — R0789 Other chest pain: Secondary | ICD-10-CM | POA: Diagnosis not present

## 2022-03-16 DIAGNOSIS — Z79899 Other long term (current) drug therapy: Secondary | ICD-10-CM | POA: Diagnosis not present

## 2022-03-16 LAB — CBC WITH DIFFERENTIAL/PLATELET
Abs Immature Granulocytes: 0.01 10*3/uL (ref 0.00–0.07)
Basophils Absolute: 0 10*3/uL (ref 0.0–0.1)
Basophils Relative: 1 %
Eosinophils Absolute: 0.3 10*3/uL (ref 0.0–0.5)
Eosinophils Relative: 7 %
HCT: 35.4 % — ABNORMAL LOW (ref 36.0–46.0)
Hemoglobin: 11.1 g/dL — ABNORMAL LOW (ref 12.0–15.0)
Immature Granulocytes: 0 %
Lymphocytes Relative: 42 %
Lymphs Abs: 1.7 10*3/uL (ref 0.7–4.0)
MCH: 27.1 pg (ref 26.0–34.0)
MCHC: 31.4 g/dL (ref 30.0–36.0)
MCV: 86.6 fL (ref 80.0–100.0)
Monocytes Absolute: 0.5 10*3/uL (ref 0.1–1.0)
Monocytes Relative: 14 %
Neutro Abs: 1.4 10*3/uL — ABNORMAL LOW (ref 1.7–7.7)
Neutrophils Relative %: 36 %
Platelets: 203 10*3/uL (ref 150–400)
RBC: 4.09 MIL/uL (ref 3.87–5.11)
RDW: 12.8 % (ref 11.5–15.5)
WBC: 3.9 10*3/uL — ABNORMAL LOW (ref 4.0–10.5)
nRBC: 0 % (ref 0.0–0.2)

## 2022-03-16 LAB — TROPONIN I (HIGH SENSITIVITY)
Troponin I (High Sensitivity): 2 ng/L (ref <18)
Troponin I (High Sensitivity): 3 ng/L (ref <18)

## 2022-03-16 LAB — I-STAT BETA HCG BLOOD, ED (MC, WL, AP ONLY): I-stat hCG, quantitative: <5 m[IU]/mL (ref <5)

## 2022-03-16 LAB — BASIC METABOLIC PANEL
Anion gap: 5 (ref 5–15)
BUN: 13 mg/dL (ref 6–20)
CO2: 25 mmol/L (ref 22–32)
Calcium: 8.5 mg/dL — ABNORMAL LOW (ref 8.9–10.3)
Chloride: 106 mmol/L (ref 98–111)
Creatinine, Ser: 0.73 mg/dL (ref 0.44–1.00)
GFR, Estimated: >60 mL/min (ref >60)
Glucose, Bld: 96 mg/dL (ref 70–99)
Potassium: 3.6 mmol/L (ref 3.5–5.1)
Sodium: 136 mmol/L (ref 135–145)

## 2022-03-16 LAB — D-DIMER, QUANTITATIVE: D-Dimer, Quant: 0.3 ug/mL-FEU (ref 0.00–0.50)

## 2022-03-16 MED ORDER — NAPROXEN 500 MG PO TABS: 500.0000 mg | ORAL_TABLET | Freq: Two times a day (BID) | ORAL | 0 refills | Status: DC

## 2022-03-16 MED ORDER — ONDANSETRON HCL 4 MG/2ML IJ SOLN
4.0000 mg | Freq: Once | INTRAMUSCULAR | Status: AC
  Administered 2022-03-16: 4 mg via INTRAVENOUS
  Filled 2022-03-16: qty 2

## 2022-03-16 MED ORDER — KETOROLAC TROMETHAMINE 30 MG/ML IJ SOLN
30.0000 mg | Freq: Once | INTRAMUSCULAR | Status: AC
Start: 2022-03-16 — End: 2022-03-16
  Administered 2022-03-16: 30 mg via INTRAVENOUS
  Filled 2022-03-16: qty 1

## 2022-03-16 MED ORDER — ACETAMINOPHEN 325 MG PO TABS
650.0000 mg | ORAL_TABLET | Freq: Once | ORAL | Status: AC
  Administered 2022-03-16: 650 mg via ORAL
  Filled 2022-03-16: qty 2

## 2022-03-16 MED ORDER — ONDANSETRON 8 MG PO TBDP: 8.0000 mg | ORAL_TABLET | Freq: Three times a day (TID) | ORAL | 0 refills | Status: DC | PRN

## 2022-03-16 NOTE — ED Provider Notes (Addendum)
[facility] EMERGENCY DEPARTMENT Provider Note   CSN: [id number] Arrival date & time: 03/16/22  0407     History  Chief Complaint  Patient presents with   Chest Pain    Tina Fletcher is a 36 y.o. female.  The history is provided by the patient.  Chest Pain She has history of hypertension, heart failure and comes in because of chest pain.  She woke up at about 4 AM during a rather vivid dream and had had enuresis.  She had sharp pain in her mid chest with radiation to both arms.  There was some associated dyspnea and nausea but no diaphoresis and no emesis.  She took her blood pressure and it was significantly elevated at 150/130.  Pain is resolving, but still present.  Pain was not worse with deep breath or with movement.  She has never had pain like this before.  She is a non-smoker and denies history of diabetes or hyperlipidemia and denies family history of premature coronary atherosclerosis.  She does have a Nexplanon implant, denies recent surgery or travel   Home Medications Prior to Admission medications   Medication Sig Start Date End Date Taking? Authorizing Provider  etonogestrel (NEXPLANON) 68 MG IMPL implant Inject 1 each into the skin continuous.    [provider]  Iron, Ferrous Sulfate, 325 (65 Fe) MG TABS Take 325 mg by mouth daily. 04/09/21   Anders Simmonds, PA-C  meclizine (ANTIVERT) 12.5 MG tablet Take 12.5 mg by mouth as needed for dizziness.    [provider]  metoprolol succinate (TOPROL XL) 25 MG 24 hr tablet Take 0.5 tablets (12.5 mg total) by mouth at bedtime. 04/09/21 04/09/22  Anders Simmonds, PA-C  omeprazole (PRILOSEC) 20 MG capsule TAKE (1) CAPSULE BY MOUTH EVERY DAY. 02/17/22   Jacklynn Ganong, FNP  ondansetron (ZOFRAN-ODT) 8 MG disintegrating tablet 8mg  ODT q4 hours prn nausea 08/12/21   08/14/21, MD  oxyCODONE (OXY IR/ROXICODONE) 5 MG immediate release tablet Take 5 mg by mouth 2 (two) times daily as needed. 01/04/22    [provider]  potassium chloride SA (KLOR-CON M) 20 MEQ tablet TAKE ONE TABLET BY MOUTH ONCE DAILY. 11/19/21   11/21/21, MD  sertraline (ZOLOFT) 50 MG tablet Take 1.5 tablets (75 mg total) by mouth daily. 05/04/21   [place], 13/7/22, FNP  Vitamin D, Ergocalciferol, (DRISDOL) 1.25 MG (50000 UNIT) CAPS capsule Take 50,000 Units by mouth once a week. 04/30/21   [provider]      Allergies    Fentanyl    Review of Systems   Review of Systems  Cardiovascular:  Positive for chest pain.  All other systems reviewed and are negative.   Physical Exam Updated Vital Signs BP (!) 141/105   Pulse (!) 110   Temp 98 F (36.7 C) (Oral)   Resp 20   Ht 5\' 8"  (1.727 m)   Wt 61.7 kg   SpO2 100%   BMI 20.68 kg/m  Physical Exam Vitals and nursing note reviewed.   36 year old female, resting comfortably and in no acute distress. Vital signs are significant for elevated blood pressure and heart rate. Oxygen saturation is 100%, which is normal. Head is normocephalic and atraumatic. PERRLA, EOMI. Oropharynx is clear. Neck is nontender and supple without adenopathy or JVD. Back is nontender and there is no CVA tenderness. Lungs are clear without rales, wheezes, or rhonchi. Chest is nontender. Heart has regular rate and rhythm without  murmur. Abdomen is soft, flat, nontender. Extremities have no cyanosis or edema, full range of motion is present. Skin is warm and dry without rash. Neurologic: Mental status is normal, cranial nerves are intact, moves all extremities equally.  ED Results / Procedures / Treatments   Labs (all labs ordered are listed, but only abnormal results are displayed) Labs Reviewed  BASIC METABOLIC PANEL - Abnormal; Notable for the following components:      Result Value   Calcium 8.5 (*)    All other components within normal limits  CBC WITH DIFFERENTIAL/PLATELET - Abnormal; Notable for the following components:   WBC 3.9 (*)    Hemoglobin  11.1 (*)    HCT 35.4 (*)    Neutro Abs 1.4 (*)    All other components within normal limits  D-DIMER, QUANTITATIVE  I-STAT BETA HCG BLOOD, ED ([facility], [facility], [facility] ONLY)  TROPONIN I (HIGH SENSITIVITY)  TROPONIN I (HIGH SENSITIVITY)    EKG EKG Interpretation  Date/Time:  Tuesday March 16 2022 04:48:55 EDT Ventricular Rate:  79 PR Interval:  173 QRS Duration: 85 QT Interval:  412 QTC Calculation: 473 R Axis:   58 Text Interpretation: Sinus rhythm Normal ECG When compared with ECG of 02/13/2022, No significant change was found Confirmed by Delora Fuel ([id number]) on 03/16/2022 5:24:50 AM  Radiology DG Chest 2 View  Result Date: 03/16/2022 CLINICAL DATA:  Chest pain since 4 a.m. EXAM: CHEST - 2 VIEW COMPARISON:  02/13/2022 FINDINGS: Artifact from EKG leads. Normal heart size and mediastinal contours. No acute infiltrate or edema. No effusion or pneumothorax. No acute osseous findings. IMPRESSION: Negative chest. Electronically Signed   By: Jorje Guild M.D.   On: 03/16/2022 05:01    Procedures Procedures  Cardiac monitor shows normal sinus rhythm, per my interpretation.  Medications Ordered in ED Medications  ketorolac (TORADOL) 30 MG/ML injection 30 mg (has no administration in time range)    ED Course/ Medical Decision Making/ A&P                           Medical Decision Making Amount and/or Complexity of Data Reviewed Labs: ordered. Radiology: ordered.  Risk OTC drugs. Prescription drug management.   Chest pain which is somewhat atypical for ACS, specific etiology is unclear.  Differential diagnosis does include ACS, GERD, musculoskeletal pain, pulmonary embolism.  I have ordered a work-up including electrocardiogram, chest x-ray, CBC, basic metabolic panel, troponin x2, D-dimer.  I have ordered intravenous ketorolac.  I have reviewed her old records, and on 05/14/2019, she had a cardiac catheterization showing no significant atherosclerotic coronary artery disease.  Chest  x-ray shows no acute process.  Have independently viewed the images, and agree with radiologist's interpretation.  I have reviewed and interpreted the electrocardiogram, and my interpretation is normal ECG and unchanged from prior.  I have reviewed and interpreted her laboratory tests and my interpretation is borderline leukopenia, mild anemia which is unchanged from baseline.  D-dimer and troponin are normal with repeat troponin pending.  Following intravenous ketorolac, pain is improved dramatically, but it is not completely gone.  She will be kept in the emergency department for repeat troponin.    Repeat troponin is unchanged.  She is safe for discharge.  Unfortunately, she did develop some recurrence of pain with some nausea.  I have reviewed and interpreted the repeat electrocardiogram, and my interpretation is no change from prior.  She is given a dose of acetaminophen and ondansetron prior  to discharge.  I have given her a prescription for ondansetron oral dissolving tablet and naproxen, advised her to supplement with acetaminophen as needed.  Return precautions discussed.  Final Clinical Impression(s) / ED Diagnoses Final diagnoses:  Nonspecific chest pain  Normochromic normocytic anemia  Leukopenia, unspecified type    Rx / DC Orders ED Discharge Orders          Ordered    ondansetron (ZOFRAN-ODT) 8 MG disintegrating tablet  Every 8 hours PRN        03/16/22 0726    naproxen (NAPROSYN) 500 MG tablet  2 times daily        03/16/22 0726              Dione Booze, MD 03/16/22 [id number]    Dione Booze, MD 03/16/22 [phone]

## 2022-03-16 NOTE — ED Notes (Signed)
Pt states she was asleep and that her grandfather was talking to her in her dreams and wanted her to tell her mother something and that's when she woke up and "felt her BP going up".

## 2022-03-16 NOTE — Discharge Instructions (Signed)
If pain is not being relieved by naproxen, add acetaminophen.  Return if you are having any new or concerning symptoms.

## 2022-03-16 NOTE — ED Triage Notes (Signed)
Pt states she developed chest pain with sob and dizziness at 0400. Pt states she had episode of incontinence of urine.

## 2022-03-26 ENCOUNTER — Other Ambulatory Visit (HOSPITAL_COMMUNITY): Payer: Self-pay | Admitting: Family Medicine

## 2022-03-26 DIAGNOSIS — F419 Anxiety disorder, unspecified: Secondary | ICD-10-CM

## 2022-03-30 ENCOUNTER — Other Ambulatory Visit (HOSPITAL_COMMUNITY): Payer: Self-pay

## 2022-03-30 DIAGNOSIS — F419 Anxiety disorder, unspecified: Secondary | ICD-10-CM

## 2022-03-30 MED ORDER — SERTRALINE HCL 50 MG PO TABS: 75.0000 mg | ORAL_TABLET | Freq: Every day | ORAL | 5 refills | Status: AC

## 2022-03-30 MED ORDER — POTASSIUM CHLORIDE CRYS ER 20 MEQ PO TBCR: 20.0000 meq | EXTENDED_RELEASE_TABLET | Freq: Every day | ORAL | 3 refills | Status: AC

## 2022-04-08 DIAGNOSIS — Z0184 Encounter for antibody response examination: Secondary | ICD-10-CM | POA: Diagnosis not present

## 2022-07-26 ENCOUNTER — Ambulatory Visit (INDEPENDENT_AMBULATORY_CARE_PROVIDER_SITE_OTHER): Payer: Medicaid Other

## 2022-07-26 ENCOUNTER — Encounter: Payer: Self-pay | Admitting: Orthopedic Surgery

## 2022-07-26 ENCOUNTER — Ambulatory Visit: Payer: Medicaid Other | Admitting: Orthopedic Surgery

## 2022-07-26 VITALS — BP 129/88 | HR 89 | Ht 69.0 in | Wt 131.0 lb

## 2022-07-26 DIAGNOSIS — M79644 Pain in right finger(s): Secondary | ICD-10-CM | POA: Diagnosis not present

## 2022-07-26 DIAGNOSIS — G8929 Other chronic pain: Secondary | ICD-10-CM

## 2022-07-26 NOTE — Progress Notes (Signed)
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 and sent to a pain management specialist who put her on hydrocodone and oxycodone after she had failed anti-inflammatory treatment  She comes in with persistent pain complaining that the thumb will not extend and the pain is located over the extensor compartment #1     Allergies  Allergen Reactions   Fentanyl Other (See Comments)    Headaches    Current Outpatient Medications  Medication Instructions   etonogestrel (NEXPLANON) 68 MG IMPL implant 1 each, Subcutaneous, Continuous   Iron (Ferrous Sulfate) 325 mg, Oral, Daily   meclizine (ANTIVERT) 12.5 mg, Oral, As needed   metoprolol succinate (TOPROL XL) 12.5 mg, Oral, Daily at bedtime   naproxen (NAPROSYN) 500 mg, Oral, 2 times daily   omeprazole (PRILOSEC) 20 MG capsule TAKE (1) CAPSULE BY MOUTH EVERY DAY.   ondansetron (ZOFRAN-ODT) 8 mg, Oral, Every 8 hours PRN   oxyCODONE (OXY IR/ROXICODONE) 5 mg, Oral, 2 times daily PRN   potassium chloride SA (KLOR-CON M) 20 MEQ tablet 20 mEq, Oral, Daily   sertraline (ZOLOFT) 75 mg, Oral, Daily   Vitamin D (Ergocalciferol) (DRISDOL) 50,000 Units, Oral, Weekly    Review of Systems  Constitutional:  Negative for chills and fever.  Musculoskeletal:        Chronic back pain  Neurological:  Negative for tingling.     BP 129/88   Pulse 89   Ht 5\' 9"  (1.753 m)   Wt 131 lb (59.4 kg)   BMI 19.35 kg/m   Body mass index is 19.35 kg/m.  General appearance: Well-developed well-nourished no gross deformities  Cardiovascular normal pulse and perfusion normal color without edema  Neurologically no sensation loss or deficits or pathologic reflexes  Psychological: Awake alert and oriented x3 mood and affect normal  Skin no lacerations or ulcerations  no nodularity no palpable masses, no erythema or nodularity  Musculoskeletal:   Right thumb exam no skin abnormalities she can extend the IP joint she has some loss of motion of the MCP joint but nothing really significant neurovascular exam is intact she has tenderness over the first extensor compartment positive Finkelstein's test normal wrist joint exam     Past Medical History:  Diagnosis Date   Abnormal menstrual periods 02/12/2014   CHF (congestive heart failure) ([place])    Herpes simplex without mention of complication    Hypertension    Medical history non-contributory    Migraines    Nexplanon insertion 08/13/2013   08/13/13 inserted left arm, remove 08/13/16   Vitamin D deficiency 04/01/2016    Past Surgical History:  Procedure Laterality Date   RIGHT/LEFT HEART CATH AND CORONARY ANGIOGRAPHY N/A 05/14/2019   Procedure: RIGHT/LEFT HEART CATH AND CORONARY ANGIOGRAPHY;  Surgeon: Larey Dresser, MD;  Location: [facility];  Service: Cardiovascular;  Laterality: N/A;    Family History  Problem Relation Age of Onset   Diabetes Maternal Grandmother    Cancer Paternal Grandfather        prancreatic   COPD Maternal Grandfather    Cancer Maternal Grandfather        prostate   Asthma Maternal Grandfather    Asthma Son    Bronchitis Son    Social History   Tobacco Use  Smoking status: Never   Smokeless tobacco: Never  Vaping Use   Vaping Use: Never used  Substance Use Topics   Alcohol use: Not Currently   Drug use: No    Allergies  Allergen Reactions   Fentanyl Other (See Comments)    Headaches    No outpatient medications have been marked as taking for the 07/26/22 encounter (Office Visit) with Carole Civil, MD.     MEDICAL DECISION MAKING  A.  Encounter Diagnosis  Name Primary?   Chronic pain of right thumb Yes    B. DATA ANALYSED:   IMAGING: Interpretation of images: I have personally reviewed the images and my interpretation is  normal x-ray no arthritis Orders: MRI right thumb  Outside records reviewed: None available   C. MANAGEMENT   Differential diagnosis [facility] joint inflammation, tenosynovitis first extensor compartment  No orders of the defined types were placed in this encounter.    Arther Abbott, MD  07/26/2022 11:34 AM

## 2022-07-26 NOTE — Patient Instructions (Addendum)
You will have your imaging done at [facility]. Call and schedule your appointment within one week. We will work on your approval.   [facility] [address] [place] [address] PHONE: [phone]  

## 2022-07-28 ENCOUNTER — Ambulatory Visit: Payer: Medicaid Other | Admitting: Family Medicine

## 2022-07-30 ENCOUNTER — Emergency Department (HOSPITAL_COMMUNITY)
Admission: EM | Admit: 2022-07-30 | Discharge: 2022-07-31 | Disposition: A | Discharge status: Home / Self Care | Payer: Medicaid Other | Attending: Emergency Medicine | Admitting: Emergency Medicine

## 2022-07-30 ENCOUNTER — Encounter (HOSPITAL_COMMUNITY): Payer: Self-pay

## 2022-07-30 ENCOUNTER — Emergency Department (HOSPITAL_COMMUNITY): Payer: Medicaid Other

## 2022-07-30 ENCOUNTER — Other Ambulatory Visit: Payer: Self-pay

## 2022-07-30 DIAGNOSIS — R0789 Other chest pain: Secondary | ICD-10-CM | POA: Insufficient documentation

## 2022-07-30 DIAGNOSIS — R079 Chest pain, unspecified: Secondary | ICD-10-CM | POA: Diagnosis present

## 2022-07-30 LAB — BASIC METABOLIC PANEL
Anion gap: 8 (ref 5–15)
BUN: 10 mg/dL (ref 6–20)
CO2: 26 mmol/L (ref 22–32)
Calcium: 8.8 mg/dL — ABNORMAL LOW (ref 8.9–10.3)
Chloride: 101 mmol/L (ref 98–111)
Creatinine, Ser: 0.81 mg/dL (ref 0.44–1.00)
GFR, Estimated: >60 mL/min (ref >60)
Glucose, Bld: 98 mg/dL (ref 70–99)
Potassium: 3.1 mmol/L — ABNORMAL LOW (ref 3.5–5.1)
Sodium: 135 mmol/L (ref 135–145)

## 2022-07-30 LAB — CBC WITH DIFFERENTIAL/PLATELET
Abs Immature Granulocytes: 0.01 10*3/uL (ref 0.00–0.07)
Basophils Absolute: 0 10*3/uL (ref 0.0–0.1)
Basophils Relative: 1 %
Eosinophils Absolute: 0.2 10*3/uL (ref 0.0–0.5)
Eosinophils Relative: 4 %
HCT: 32.8 % — ABNORMAL LOW (ref 36.0–46.0)
Hemoglobin: 10.5 g/dL — ABNORMAL LOW (ref 12.0–15.0)
Immature Granulocytes: 0 %
Lymphocytes Relative: 37 %
Lymphs Abs: 1.5 10*3/uL (ref 0.7–4.0)
MCH: 27.5 pg (ref 26.0–34.0)
MCHC: 32 g/dL (ref 30.0–36.0)
MCV: 85.9 fL (ref 80.0–100.0)
Monocytes Absolute: 0.4 10*3/uL (ref 0.1–1.0)
Monocytes Relative: 10 %
Neutro Abs: 1.9 10*3/uL (ref 1.7–7.7)
Neutrophils Relative %: 48 %
Platelets: 173 10*3/uL (ref 150–400)
RBC: 3.82 MIL/uL — ABNORMAL LOW (ref 3.87–5.11)
RDW: 12.1 % (ref 11.5–15.5)
WBC: 3.9 10*3/uL — ABNORMAL LOW (ref 4.0–10.5)
nRBC: 0 % (ref 0.0–0.2)

## 2022-07-30 LAB — TROPONIN I (HIGH SENSITIVITY): Troponin I (High Sensitivity): 3 ng/L (ref <18)

## 2022-07-30 NOTE — ED Triage Notes (Signed)
Pt arrived via POV c/o CP that began 30 mins PTA. Pt reports mid-sternal CP and reports she took Hydrocodone 20 mins ago PTA and pain has eased up. Pt denies injury and reports pain began while she was sitting down at home reading with her son.

## 2022-07-31 LAB — TROPONIN I (HIGH SENSITIVITY): Troponin I (High Sensitivity): 4 ng/L (ref <18)

## 2022-07-31 NOTE — ED Provider Notes (Signed)
[place] Provider Note   CSN: [id number] Arrival date & time: 07/30/22  2055     History  Chief Complaint  Patient presents with   Chest Pain    Tina Fletcher is a 37 y.o. female.  Patient presents to the emergency department for evaluation of chest pain.  Patient reports onset of chest pain earlier today while sitting and reading.  She took a hydrocodone pill and the pain slowly eased off and is now gone.  No associated shortness of breath.  Patient reports that she does have a cardiac history, was treated for "fluid around her heart"       Home Medications Prior to Admission medications   Medication Sig Start Date End Date Taking? Authorizing Provider  etonogestrel (NEXPLANON) 68 MG IMPL implant Inject 1 each into the skin continuous.    [provider]  Iron, Ferrous Sulfate, 325 (65 Fe) MG TABS Take 325 mg by mouth daily. 04/09/21   Argentina Donovan, PA-C  meclizine (ANTIVERT) 12.5 MG tablet Take 12.5 mg by mouth as needed for dizziness.    [provider]  metoprolol succinate (TOPROL XL) 25 MG 24 hr tablet Take 0.5 tablets (12.5 mg total) by mouth at bedtime. 04/09/21 04/09/22  Argentina Donovan, PA-C  naproxen (NAPROSYN) 500 MG tablet Take 1 tablet (500 mg total) by mouth 2 (two) times daily. 2/70/62   Delora Fuel, MD  omeprazole (PRILOSEC) 20 MG capsule TAKE (1) CAPSULE BY MOUTH EVERY DAY. 02/17/22   Milford, Maricela Bo, FNP  ondansetron (ZOFRAN-ODT) 8 MG disintegrating tablet Take 1 tablet (8 mg total) by mouth every 8 (eight) hours as needed for nausea or vomiting. 3/76/28   Delora Fuel, MD  oxyCODONE (OXY IR/ROXICODONE) 5 MG immediate release tablet Take 5 mg by mouth 2 (two) times daily as needed. 01/04/22   [provider]  potassium chloride SA (KLOR-CON M) 20 MEQ tablet Take 1 tablet (20 mEq total) by mouth daily. 03/30/22   Larey Dresser, MD  sertraline (ZOLOFT) 50 MG tablet Take 1.5  tablets (75 mg total) by mouth daily. 03/30/22   Larey Dresser, MD  Vitamin D, Ergocalciferol, (DRISDOL) 1.25 MG (50000 UNIT) CAPS capsule Take 50,000 Units by mouth once a week. 04/30/21   [provider]      Allergies    Fentanyl    Review of Systems   Review of Systems  Physical Exam Updated Vital Signs BP (!) 132/101   Pulse 85   Resp 11   Ht 5\' 9"  (1.753 m)   Wt 60.3 kg   SpO2 96%   BMI 19.64 kg/m  Physical Exam Vitals and nursing note reviewed.  Constitutional:      General: She is not in acute distress.    Appearance: She is well-developed.  HENT:     Head: Normocephalic and atraumatic.     Mouth/Throat:     Mouth: Mucous membranes are moist.  Eyes:     General: Vision grossly intact. Gaze aligned appropriately.     Extraocular Movements: Extraocular movements intact.     Conjunctiva/sclera: Conjunctivae normal.  Cardiovascular:     Rate and Rhythm: Normal rate and regular rhythm.     Pulses: Normal pulses.     Heart sounds: Normal heart sounds, S1 normal and S2 normal. No murmur heard.    No friction rub. No gallop.  Pulmonary:     Effort: Pulmonary effort is normal. No respiratory distress.  Breath sounds: Normal breath sounds.  Abdominal:     General: Bowel sounds are normal.     Palpations: Abdomen is soft.     Tenderness: There is no abdominal tenderness. There is no guarding or rebound.     Hernia: No hernia is present.  Musculoskeletal:        General: No swelling.     Cervical back: Full passive range of motion without pain, normal range of motion and neck supple. No spinous process tenderness or muscular tenderness. Normal range of motion.     Right lower leg: No edema.     Left lower leg: No edema.  Skin:    General: Skin is warm and dry.     Capillary Refill: Capillary refill takes less than 2 seconds.     Findings: No ecchymosis, erythema, rash or wound.  Neurological:     General: No focal deficit present.     Mental Status:  She is alert and oriented to person, place, and time.     GCS: GCS eye subscore is 4. GCS verbal subscore is 5. GCS motor subscore is 6.     Cranial Nerves: Cranial nerves 2-12 are intact.     Sensory: Sensation is intact.     Motor: Motor function is intact.     Coordination: Coordination is intact.  Psychiatric:        Attention and Perception: Attention normal.        Mood and Affect: Mood normal.        Speech: Speech normal.        Behavior: Behavior normal.     ED Results / Procedures / Treatments   Labs (all labs ordered are listed, but only abnormal results are displayed) Labs Reviewed  CBC WITH DIFFERENTIAL/PLATELET - Abnormal; Notable for the following components:      Result Value   WBC 3.9 (*)    RBC 3.82 (*)    Hemoglobin 10.5 (*)    HCT 32.8 (*)    All other components within normal limits  BASIC METABOLIC PANEL - Abnormal; Notable for the following components:   Potassium 3.1 (*)    Calcium 8.8 (*)    All other components within normal limits  TROPONIN I (HIGH SENSITIVITY)  TROPONIN I (HIGH SENSITIVITY)    EKG EKG Interpretation  Date/Time:  Friday July 30 2022 21:43:27 EST Ventricular Rate:  87 PR Interval:  171 QRS Duration: 85 QT Interval:  411 QTC Calculation: 495 R Axis:   73 Text Interpretation: Sinus rhythm Borderline repolarization abnormality Borderline prolonged QT interval No significant change since last tracing Confirmed by Orpah Greek [phone]) on 07/31/2022 1:20:40 AM  Radiology DG Chest Port 1 View  Result Date: 07/30/2022 CLINICAL DATA:  Chest pain EXAM: PORTABLE CHEST 1 VIEW COMPARISON:  Chest x-ray 03/16/2022 FINDINGS: The heart size and mediastinal contours are within normal limits. Tiny nodular densities are clustered over the left lung apex, not seen on prior. The lungs are otherwise clear. No pleural effusion or pneumothorax. The visualized skeletal structures are unremarkable. IMPRESSION: Tiny nodular densities are  clustered over the left lung apex, not seen on prior. This could be infectious or inflammatory in etiology correlated to artifact. Electronically Signed   By: Ronney Asters M.D.   On: 07/30/2022 22:51    Procedures Procedures    Medications Ordered in ED Medications - No data to display  ED Course/ Medical Decision Making/ A&P  Medical Decision Making Amount and/or Complexity of Data Reviewed External Data Reviewed: labs, radiology, ECG and notes. Labs: ordered. Decision-making details documented in ED Course. Radiology: ordered and independent interpretation performed. Decision-making details documented in ED Course. ECG/medicine tests: ordered and independent interpretation performed. Decision-making details documented in ED Course.   Patient presents for evaluation of chest pain.  Patient reports cardiac history.  Reviewing her records reveals a history of nonischemic cardiomyopathy in 2020 with recovery of her ejection fraction is currently.  She does not clinically look like she is in heart failure.  Chest x-ray without pulmonary edema, no cardiomegaly.  EKG without ischemic changes.  Troponin is negative x 2.  Patient's presentation seems very atypical for cardiac etiology.  Likely does not need any specific follow-up, can follow-up with her cardiologist if symptoms continue.  If she has any worsening symptoms, return to the ED.        Final Clinical Impression(s) / ED Diagnoses Final diagnoses:  Atypical chest pain    Rx / DC Orders ED Discharge Orders     None         Wyman Meschke, Gwenyth Allegra, MD 07/31/22 [phone]

## 2022-08-08 ENCOUNTER — Inpatient Hospital Stay: Admission: RE | Admit: 2022-08-08 | Payer: Medicaid Other | Source: Ambulatory Visit

## 2022-08-23 ENCOUNTER — Other Ambulatory Visit (HOSPITAL_COMMUNITY): Payer: Self-pay | Admitting: Adult Health

## 2022-08-23 DIAGNOSIS — M5459 Other low back pain: Secondary | ICD-10-CM

## 2022-08-26 ENCOUNTER — Encounter: Payer: Self-pay | Admitting: Radiology

## 2022-12-13 ENCOUNTER — Ambulatory Visit: Payer: BLUE CROSS/BLUE SHIELD | Admitting: Obstetrics and Gynecology

## 2022-12-23 ENCOUNTER — Other Ambulatory Visit: Payer: Self-pay

## 2022-12-23 ENCOUNTER — Emergency Department (HOSPITAL_COMMUNITY): Payer: BLUE CROSS/BLUE SHIELD

## 2022-12-23 ENCOUNTER — Encounter (HOSPITAL_COMMUNITY): Payer: Self-pay

## 2022-12-23 ENCOUNTER — Emergency Department (HOSPITAL_COMMUNITY)
Admission: EM | Admit: 2022-12-23 | Discharge: 2022-12-23 | Disposition: A | Discharge status: Home / Self Care | Payer: BLUE CROSS/BLUE SHIELD | Attending: Emergency Medicine | Admitting: Emergency Medicine

## 2022-12-23 DIAGNOSIS — R0789 Other chest pain: Secondary | ICD-10-CM | POA: Insufficient documentation

## 2022-12-23 DIAGNOSIS — Z79899 Other long term (current) drug therapy: Secondary | ICD-10-CM | POA: Insufficient documentation

## 2022-12-23 DIAGNOSIS — I10 Essential (primary) hypertension: Secondary | ICD-10-CM | POA: Insufficient documentation

## 2022-12-23 DIAGNOSIS — R079 Chest pain, unspecified: Secondary | ICD-10-CM | POA: Diagnosis present

## 2022-12-23 LAB — CBC WITH DIFFERENTIAL/PLATELET
Abs Immature Granulocytes: 0 10*3/uL (ref 0.00–0.07)
Basophils Absolute: 0 10*3/uL (ref 0.0–0.1)
Basophils Relative: 1 %
Eosinophils Absolute: 0.1 10*3/uL (ref 0.0–0.5)
Eosinophils Relative: 4 %
HCT: 31.2 % — ABNORMAL LOW (ref 36.0–46.0)
Hemoglobin: 10.2 g/dL — ABNORMAL LOW (ref 12.0–15.0)
Immature Granulocytes: 0 %
Lymphocytes Relative: 49 %
Lymphs Abs: 1.5 10*3/uL (ref 0.7–4.0)
MCH: 28.3 pg (ref 26.0–34.0)
MCHC: 32.7 g/dL (ref 30.0–36.0)
MCV: 86.4 fL (ref 80.0–100.0)
Monocytes Absolute: 0.3 10*3/uL (ref 0.1–1.0)
Monocytes Relative: 9 %
Neutro Abs: 1.1 10*3/uL — ABNORMAL LOW (ref 1.7–7.7)
Neutrophils Relative %: 37 %
Platelets: 198 10*3/uL (ref 150–400)
RBC: 3.61 MIL/uL — ABNORMAL LOW (ref 3.87–5.11)
RDW: 12.1 % (ref 11.5–15.5)
WBC: 3.1 10*3/uL — ABNORMAL LOW (ref 4.0–10.5)
nRBC: 0 % (ref 0.0–0.2)

## 2022-12-23 LAB — BASIC METABOLIC PANEL
Anion gap: 9 (ref 5–15)
BUN: 12 mg/dL (ref 6–20)
CO2: 25 mmol/L (ref 22–32)
Calcium: 9.3 mg/dL (ref 8.9–10.3)
Chloride: 105 mmol/L (ref 98–111)
Creatinine, Ser: 0.76 mg/dL (ref 0.44–1.00)
GFR, Estimated: >60 mL/min (ref >60)
Glucose, Bld: 105 mg/dL — ABNORMAL HIGH (ref 70–99)
Potassium: 3.4 mmol/L — ABNORMAL LOW (ref 3.5–5.1)
Sodium: 139 mmol/L (ref 135–145)

## 2022-12-23 LAB — BRAIN NATRIURETIC PEPTIDE: B Natriuretic Peptide: 10 pg/mL (ref 0.0–100.0)

## 2022-12-23 LAB — D-DIMER, QUANTITATIVE: D-Dimer, Quant: 0.62 ug{FEU}/mL — ABNORMAL HIGH (ref 0.00–0.50)

## 2022-12-23 LAB — TROPONIN I (HIGH SENSITIVITY)
Troponin I (High Sensitivity): 3 ng/L (ref <18)
Troponin I (High Sensitivity): 5 ng/L

## 2022-12-23 MED ORDER — IOHEXOL 350 MG/ML SOLN
100.0000 mL | Freq: Once | INTRAVENOUS | Status: AC | PRN
  Administered 2022-12-23: 100 mL via INTRAVENOUS

## 2022-12-23 NOTE — ED Triage Notes (Signed)
Chest pain since this morning. Seen here recurrently for same.

## 2022-12-23 NOTE — ED Notes (Signed)
Patient returned from CT, bed locked in lowest position with call bell in reach.

## 2022-12-23 NOTE — ED Provider Notes (Signed)
[place] EMERGENCY DEPARTMENT AT [facility] Provider Note   CSN: [id number] Arrival date & time: 12/23/22  0006     History  Chief Complaint  Patient presents with   Chest Pain    Tina Fletcher is a 37 y.o. female.  Patient presents to the emergency department for evaluation of chest pain that began earlier today.       Home Medications Prior to Admission medications   Medication Sig Start Date End Date Taking? Authorizing Provider  etonogestrel (NEXPLANON) 68 MG IMPL implant Inject 1 each into the skin continuous.    [provider]  Iron, Ferrous Sulfate, 325 (65 Fe) MG TABS Take 325 mg by mouth daily. 04/09/21   Anders Simmonds, PA-C  meclizine (ANTIVERT) 12.5 MG tablet Take 12.5 mg by mouth as needed for dizziness.    [provider]  metoprolol succinate (TOPROL XL) 25 MG 24 hr tablet Take 0.5 tablets (12.5 mg total) by mouth at bedtime. 04/09/21 04/09/22  Anders Simmonds, PA-C  naproxen (NAPROSYN) 500 MG tablet Take 1 tablet (500 mg total) by mouth 2 (two) times daily. 03/16/22   Dione Booze, MD  omeprazole (PRILOSEC) 20 MG capsule TAKE (1) CAPSULE BY MOUTH EVERY DAY. 02/17/22   Milford, Anderson Malta, FNP  ondansetron (ZOFRAN-ODT) 8 MG disintegrating tablet Take 1 tablet (8 mg total) by mouth every 8 (eight) hours as needed for nausea or vomiting. 03/16/22   Dione Booze, MD  oxyCODONE (OXY IR/ROXICODONE) 5 MG immediate release tablet Take 5 mg by mouth 2 (two) times daily as needed. 01/04/22   [provider]  potassium chloride SA (KLOR-CON M) 20 MEQ tablet Take 1 tablet (20 mEq total) by mouth daily. 03/30/22   Laurey Morale, MD  sertraline (ZOLOFT) 50 MG tablet Take 1.5 tablets (75 mg total) by mouth daily. 03/30/22   Laurey Morale, MD  Vitamin D, Ergocalciferol, (DRISDOL) 1.25 MG (50000 UNIT) CAPS capsule Take 50,000 Units by mouth once a week. 04/30/21   [provider]      Allergies    Fentanyl    Review  of Systems   Review of Systems  Physical Exam Updated Vital Signs BP 118/86   Pulse 74   Temp 97.8 F (36.6 C) (Oral)   Resp 17   Ht 5\' 9"  (1.753 m)   Wt 60.3 kg   SpO2 99%   BMI 19.63 kg/m  Physical Exam Vitals and nursing note reviewed.  Constitutional:      General: She is not in acute distress.    Appearance: She is well-developed.  HENT:     Head: Normocephalic and atraumatic.     Mouth/Throat:     Mouth: Mucous membranes are moist.  Eyes:     General: Vision grossly intact. Gaze aligned appropriately.     Extraocular Movements: Extraocular movements intact.     Conjunctiva/sclera: Conjunctivae normal.  Cardiovascular:     Rate and Rhythm: Normal rate and regular rhythm.     Pulses: Normal pulses.     Heart sounds: Normal heart sounds, S1 normal and S2 normal. No murmur heard.    No friction rub. No gallop.  Pulmonary:     Effort: Pulmonary effort is normal. No respiratory distress.     Breath sounds: Normal breath sounds.  Abdominal:     General: Bowel sounds are normal.     Palpations: Abdomen is soft.     Tenderness: There is no abdominal tenderness. There is no  guarding or rebound.     Hernia: No hernia is present.  Musculoskeletal:        General: No swelling.     Cervical back: Full passive range of motion without pain, normal range of motion and neck supple. No spinous process tenderness or muscular tenderness. Normal range of motion.     Right lower leg: No edema.     Left lower leg: No edema.  Skin:    General: Skin is warm and dry.     Capillary Refill: Capillary refill takes less than 2 seconds.     Findings: No ecchymosis, erythema, rash or wound.  Neurological:     General: No focal deficit present.     Mental Status: She is alert and oriented to person, place, and time.     GCS: GCS eye subscore is 4. GCS verbal subscore is 5. GCS motor subscore is 6.     Cranial Nerves: Cranial nerves 2-12 are intact.     Sensory: Sensation is intact.      Motor: Motor function is intact.     Coordination: Coordination is intact.  Psychiatric:        Attention and Perception: Attention normal.        Mood and Affect: Mood normal.        Speech: Speech normal.        Behavior: Behavior normal.     ED Results / Procedures / Treatments   Labs (all labs ordered are listed, but only abnormal results are displayed) Labs Reviewed  CBC WITH DIFFERENTIAL/PLATELET - Abnormal; Notable for the following components:      Result Value   WBC 3.1 (*)    RBC 3.61 (*)    Hemoglobin 10.2 (*)    HCT 31.2 (*)    Neutro Abs 1.1 (*)    All other components within normal limits  BASIC METABOLIC PANEL - Abnormal; Notable for the following components:   Potassium 3.4 (*)    Glucose, Bld 105 (*)    All other components within normal limits  D-DIMER, QUANTITATIVE - Abnormal; Notable for the following components:   D-Dimer, Quant 0.62 (*)    All other components within normal limits  BRAIN NATRIURETIC PEPTIDE  TROPONIN I (HIGH SENSITIVITY)  TROPONIN I (HIGH SENSITIVITY)    EKG EKG Interpretation  Date/Time:  Thursday December 23 2022 00:17:07 EDT Ventricular Rate:  113 PR Interval:  163 QRS Duration: 79 QT Interval:  350 QTC Calculation: 480 R Axis:   60 Text Interpretation: Sinus tachycardia Consider right atrial enlargement Consider left ventricular hypertrophy Nonspecific T abnormalities, inferior leads Borderline prolonged QT interval No significant change since last tracing Confirmed by Gilda Crease [phone]) on 12/23/2022 1:50:55 AM  Radiology CT Angio Chest Pulmonary Embolism (PE) W or WO Contrast  Result Date: 12/23/2022 CLINICAL DATA:  Pulmonary embolism (PE) suspected, low to intermediate prob, positive D-dimer. Chest pain EXAM: CT ANGIOGRAPHY CHEST WITH CONTRAST TECHNIQUE: Multidetector CT imaging of the chest was performed using the standard protocol during bolus administration of intravenous contrast. Multiplanar CT image  reconstructions and MIPs were obtained to evaluate the vascular anatomy. RADIATION DOSE REDUCTION: This exam was performed according to the departmental dose-optimization program which includes automated exposure control, adjustment of the mA and/or kV according to patient size and/or use of iterative reconstruction technique. CONTRAST:  OMNIPAQUE IOHEXOL 350 MG/ML SOLN COMPARISON:  04/17/2021 FINDINGS: Cardiovascular: Satisfactory opacification of the pulmonary arteries to the segmental level. No evidence of pulmonary embolism. Normal  heart size. No pericardial effusion. Mediastinum/Nodes: No enlarged mediastinal, hilar, or axillary lymph nodes. Thyroid gland, trachea, and esophagus demonstrate no significant findings. Lungs/Pleura: There is peripheral consolidation within the left apex which may represent tumefactive scarring, but is new from prior examination and is indeterminate. 4 mm subpleural pulmonary nodule is seen within the a right lower lobe, axial image # 72/6. The lungs are otherwise clear. No pneumothorax or pleural effusion. No central obstructing lesion. Upper Abdomen: No acute abnormality. Musculoskeletal: No chest wall abnormality. No acute or significant osseous findings. Review of the MIP images confirms the above findings. IMPRESSION: 1. No pulmonary embolism. 2. Peripheral consolidation within the left apex which may represent tumefactive scarring, but is new from prior examination and is indeterminate. In absence of more recent prior examinations, follow-up imaging in 3-6 months is recommended to document stability. 3. Solid pulmonary nodule measuring 4 mm. Per Fleischner Society Guidelines, no routine follow-up imaging is recommended. These guidelines do not apply to immunocompromised patients and patients with cancer. Follow up in patients with significant comorbidities as clinically warranted. For lung cancer screening, adhere to Lung-RADS guidelines. Reference: Radiology. 2017;  284(1):228-43. Electronically Signed   By: Helyn Numbers M.D.   On: 12/23/2022 03:31   DG Chest 2 View  Result Date: 12/23/2022 CLINICAL DATA:  chest pain EXAM: CHEST - 2 VIEW COMPARISON:  Chest x-ray 07/30/2022 FINDINGS: The heart and mediastinal contours are within normal limits. No focal consolidation. No pulmonary edema. No pleural effusion. No pneumothorax. No acute osseous abnormality. IMPRESSION: No active cardiopulmonary disease. Electronically Signed   By: Tish Frederickson M.D.   On: 12/23/2022 01:08    Procedures Procedures    Medications Ordered in ED Medications  iohexol (OMNIPAQUE) 350 MG/ML injection 100 mL (100 mLs Intravenous Contrast Given 12/23/22 0252)    ED Course/ Medical Decision Making/ A&P                             Medical Decision Making Amount and/or Complexity of Data Reviewed External Data Reviewed: labs, radiology, ECG and notes. Labs: ordered. Decision-making details documented in ED Course. Radiology: ordered and independent interpretation performed. Decision-making details documented in ED Course. ECG/medicine tests: ordered and independent interpretation performed. Decision-making details documented in ED Course.  Risk Prescription drug management.   Differential Diagnosis considered includes, but not limited to: STEMI; NSTEMI; myocarditis; pericarditis; pulmonary embolism; aortic dissection; pneumothorax; pneumonia; gastritis; musculoskeletal pain  Presents to the emergency department for evaluation of chest pain.  I have reviewed her records and she does have a history of nonischemic cardiomyopathy treated last year, felt to be secondary to alcohol use.  She does have a history of hypertension.  She had a heart cath last year that showed no CAD.  She is low risk for cardiac etiology of her chest pain.  Troponin negative.  EKG unchanged from prior.  BNP normal.  Chest x-ray unremarkable.  Troponin negative x 2.  Patient pain-free at time of  evaluation.  She had had pain throughout the majority of the day, however.  This is atypical for cardiac etiology.  No further cardiac evaluation necessary.  Patient with normal oxygen saturations.  PE workups in the past with similar symptoms have been negative.  She did, however, have tachycardia at arrival and also is on hormone supplementation by implant.  A D-dimer was therefore ordered and it was mildly elevated.  CT angiography, however, rules out PE.  Follow-up with PCP  for lung findings on CT.        Final Clinical Impression(s) / ED Diagnoses Final diagnoses:  Atypical chest pain    Rx / DC Orders ED Discharge Orders     None         Ha Shannahan, Canary Brim, MD 12/23/22 [phone]

## 2022-12-23 NOTE — ED Notes (Signed)
Patient transported to CT 

## 2023-03-11 ENCOUNTER — Encounter (HOSPITAL_COMMUNITY): Payer: Self-pay

## 2023-03-11 ENCOUNTER — Other Ambulatory Visit: Payer: Self-pay

## 2023-03-11 ENCOUNTER — Emergency Department (HOSPITAL_COMMUNITY)
Admission: EM | Admit: 2023-03-11 | Discharge: 2023-03-11 | Disposition: A | Discharge status: Home / Self Care | Payer: 59 | Attending: Emergency Medicine | Admitting: Emergency Medicine

## 2023-03-11 DIAGNOSIS — F1110 Opioid abuse, uncomplicated: Secondary | ICD-10-CM | POA: Insufficient documentation

## 2023-03-11 DIAGNOSIS — R110 Nausea: Secondary | ICD-10-CM | POA: Diagnosis not present

## 2023-03-11 DIAGNOSIS — R258 Other abnormal involuntary movements: Secondary | ICD-10-CM | POA: Insufficient documentation

## 2023-03-11 DIAGNOSIS — M7918 Myalgia, other site: Secondary | ICD-10-CM | POA: Insufficient documentation

## 2023-03-11 DIAGNOSIS — Y900 Blood alcohol level of less than 20 mg/100 ml: Secondary | ICD-10-CM | POA: Insufficient documentation

## 2023-03-11 LAB — COMPREHENSIVE METABOLIC PANEL
ALT: 17 U/L (ref 0–44)
AST: 20 U/L (ref 15–41)
Albumin: 4.5 g/dL (ref 3.5–5.0)
Alkaline Phosphatase: 42 U/L (ref 38–126)
Anion gap: 11 (ref 5–15)
BUN: 10 mg/dL (ref 6–20)
CO2: 23 mmol/L (ref 22–32)
Calcium: 9.6 mg/dL (ref 8.9–10.3)
Chloride: 104 mmol/L (ref 98–111)
Creatinine, Ser: 0.86 mg/dL (ref 0.44–1.00)
GFR, Estimated: >60 mL/min (ref >60)
Glucose, Bld: 105 mg/dL — ABNORMAL HIGH (ref 70–99)
Potassium: 3.8 mmol/L (ref 3.5–5.1)
Sodium: 138 mmol/L (ref 135–145)
Total Bilirubin: 0.6 mg/dL (ref 0.3–1.2)
Total Protein: 7.7 g/dL (ref 6.5–8.1)

## 2023-03-11 LAB — RAPID URINE DRUG SCREEN, HOSP PERFORMED
Amphetamines: NOT DETECTED
Barbiturates: NOT DETECTED
Benzodiazepines: NOT DETECTED
Cocaine: NOT DETECTED
Opiates: NOT DETECTED
Tetrahydrocannabinol: NOT DETECTED

## 2023-03-11 LAB — ETHANOL: Alcohol, Ethyl (B): <10 mg/dL (ref <10)

## 2023-03-11 LAB — POC URINE PREG, ED: Preg Test, Ur: NEGATIVE

## 2023-03-11 LAB — CBC
HCT: 38.1 % (ref 36.0–46.0)
Hemoglobin: 12.2 g/dL (ref 12.0–15.0)
MCH: 27.3 pg (ref 26.0–34.0)
MCHC: 32 g/dL (ref 30.0–36.0)
MCV: 85.2 fL (ref 80.0–100.0)
Platelets: 215 10*3/uL (ref 150–400)
RBC: 4.47 MIL/uL (ref 3.87–5.11)
RDW: 12.3 % (ref 11.5–15.5)
WBC: 4.7 10*3/uL (ref 4.0–10.5)
nRBC: 0 % (ref 0.0–0.2)

## 2023-03-11 MED ORDER — ONDANSETRON HCL 4 MG/2ML IJ SOLN
4.0000 mg | Freq: Once | INTRAMUSCULAR | Status: AC
  Administered 2023-03-11: 4 mg via INTRAVENOUS
  Filled 2023-03-11: qty 2

## 2023-03-11 MED ORDER — SODIUM CHLORIDE 0.9 % IV BOLUS
1000.0000 mL | Freq: Once | INTRAVENOUS | Status: AC
  Administered 2023-03-11: 1000 mL via INTRAVENOUS

## 2023-03-11 MED ORDER — ONDANSETRON 4 MG PO TBDP: ORAL_TABLET | ORAL | 0 refills | Status: DC

## 2023-03-11 MED ORDER — KETOROLAC TROMETHAMINE 30 MG/ML IJ SOLN
30.0000 mg | Freq: Once | INTRAMUSCULAR | Status: AC
  Administered 2023-03-11: 30 mg via INTRAVENOUS
  Filled 2023-03-11: qty 1

## 2023-03-11 MED ORDER — LORAZEPAM 2 MG/ML IJ SOLN
0.5000 mg | Freq: Once | INTRAMUSCULAR | Status: AC
  Administered 2023-03-11: 0.5 mg via INTRAVENOUS
  Filled 2023-03-11: qty 1

## 2023-03-11 MED ORDER — IBUPROFEN 800 MG PO TABS: 800.0000 mg | ORAL_TABLET | Freq: Three times a day (TID) | ORAL | 0 refills | Status: AC | PRN

## 2023-03-11 NOTE — ED Triage Notes (Signed)
C/o n/v/d, weakness, dizziness, body aches, headache from opiate withdrawal.  Anxiety and restless noted in triage.  Pt reports last oxycodone 10mg  was at 0300.

## 2023-03-11 NOTE — ED Provider Notes (Signed)
[place] EMERGENCY DEPARTMENT AT [facility] Provider Note   CSN: [id number] Arrival date & time: 03/11/23  1603     History {Add pertinent medical, surgical, social history, OB history to HPI:1} Chief Complaint  Patient presents with   Withdrawal    Tina Fletcher is a 37 y.o. female.  Patient complains of nausea and shaking and general pain patient states she is having some withdrawal symptoms from opiates   Weakness      Home Medications Prior to Admission medications   Medication Sig Start Date End Date Taking? Authorizing Provider  ibuprofen (ADVIL) 800 MG tablet Take 1 tablet (800 mg total) by mouth every 8 (eight) hours as needed for moderate pain. 03/11/23  Yes Bethann Berkshire, MD  ondansetron (ZOFRAN-ODT) 4 MG disintegrating tablet 4mg  ODT q4 hours prn nausea/vomit 03/11/23  Yes Bethann Berkshire, MD  etonogestrel (NEXPLANON) 68 MG IMPL implant Inject 1 each into the skin continuous.    [provider]  Iron, Ferrous Sulfate, 325 (65 Fe) MG TABS Take 325 mg by mouth daily. 04/09/21   Anders Simmonds, PA-C  meclizine (ANTIVERT) 12.5 MG tablet Take 12.5 mg by mouth as needed for dizziness.    [provider]  metoprolol succinate (TOPROL XL) 25 MG 24 hr tablet Take 0.5 tablets (12.5 mg total) by mouth at bedtime. 04/09/21 04/09/22  Anders Simmonds, PA-C  naproxen (NAPROSYN) 500 MG tablet Take 1 tablet (500 mg total) by mouth 2 (two) times daily. 03/16/22   Dione Booze, MD  omeprazole (PRILOSEC) 20 MG capsule TAKE (1) CAPSULE BY MOUTH EVERY DAY. 02/17/22   Jacklynn Ganong, FNP  oxyCODONE (OXY IR/ROXICODONE) 5 MG immediate release tablet Take 5 mg by mouth 2 (two) times daily as needed. 01/04/22   [provider]  potassium chloride SA (KLOR-CON M) 20 MEQ tablet Take 1 tablet (20 mEq total) by mouth daily. 03/30/22   Laurey Morale, MD  sertraline (ZOLOFT) 50 MG tablet Take 1.5 tablets (75 mg total) by mouth daily. 03/30/22    Laurey Morale, MD  Vitamin D, Ergocalciferol, (DRISDOL) 1.25 MG (50000 UNIT) CAPS capsule Take 50,000 Units by mouth once a week. 04/30/21   [provider]      Allergies    Fentanyl    Review of Systems   Review of Systems  Neurological:  Positive for weakness.    Physical Exam Updated Vital Signs BP 125/84   Pulse 94   Temp 98.8 F (37.1 C)   Resp 17   Wt 61.2 kg   SpO2 97%   BMI 19.94 kg/m  Physical Exam  ED Results / Procedures / Treatments   Labs (all labs ordered are listed, but only abnormal results are displayed) Labs Reviewed  COMPREHENSIVE METABOLIC PANEL - Abnormal; Notable for the following components:      Result Value   Glucose, Bld 105 (*)    All other components within normal limits  ETHANOL  CBC  RAPID URINE DRUG SCREEN, HOSP PERFORMED  POC URINE PREG, ED    EKG None  Radiology No results found.  Procedures Procedures  {Document cardiac monitor, telemetry assessment procedure when appropriate:1}  Medications Ordered in ED Medications  sodium chloride 0.9 % bolus 1,000 mL (1,000 mLs Intravenous New Bag/Given 03/11/23 1935)  ketorolac (TORADOL) 30 MG/ML injection 30 mg (30 mg Intravenous Given 03/11/23 1933)  ondansetron (ZOFRAN) injection 4 mg (4 mg Intravenous Given 03/11/23 1933)  LORazepam (ATIVAN) injection 0.5 mg (0.5 mg  Intravenous Given 03/11/23 1934)    ED Course/ Medical Decision Making/ A&P   {   Click here for ABCD2, HEART and other calculatorsREFRESH Note before signing :1}                              Medical Decision Making Amount and/or Complexity of Data Reviewed Labs: ordered.  Risk Prescription drug management.   Patient improved with Toradol Zofran and Ativan.  She is sent home on Motrin and Zofran and will follow-up with either PCP or [facility]  {Document critical care time when appropriate:1} {Document review of labs and clinical decision tools ie heart score, Chads2Vasc2 etc:1}  {Document your  independent review of radiology images, and any outside records:1} {Document your discussion with family members, caretakers, and with consultants:1} {Document social determinants of health affecting pt's care:1} {Document your decision making why or why not admission, treatments were needed:1} Final Clinical Impression(s) / ED Diagnoses Final diagnoses:  Opiate abuse, continuous (HCC)    Rx / DC Orders ED Discharge Orders          Ordered    ibuprofen (ADVIL) 800 MG tablet  Every 8 hours PRN        03/11/23 2026    ondansetron (ZOFRAN-ODT) 4 MG disintegrating tablet        03/11/23 2026

## 2023-03-11 NOTE — Discharge Instructions (Signed)
Follow-up with your family doctor or at [facility] for help with opiate withdrawal symptoms

## 2023-03-13 ENCOUNTER — Other Ambulatory Visit: Payer: Self-pay

## 2023-03-13 ENCOUNTER — Encounter (HOSPITAL_COMMUNITY): Payer: Self-pay | Admitting: *Deleted

## 2023-03-13 ENCOUNTER — Emergency Department (HOSPITAL_COMMUNITY)
Admission: EM | Admit: 2023-03-13 | Discharge: 2023-03-13 | Disposition: A | Discharge status: Home / Self Care | Payer: 59 | Attending: Emergency Medicine | Admitting: Emergency Medicine

## 2023-03-13 DIAGNOSIS — F1123 Opioid dependence with withdrawal: Secondary | ICD-10-CM | POA: Insufficient documentation

## 2023-03-13 DIAGNOSIS — R197 Diarrhea, unspecified: Secondary | ICD-10-CM | POA: Diagnosis not present

## 2023-03-13 DIAGNOSIS — I509 Heart failure, unspecified: Secondary | ICD-10-CM | POA: Diagnosis not present

## 2023-03-13 DIAGNOSIS — Z79899 Other long term (current) drug therapy: Secondary | ICD-10-CM | POA: Insufficient documentation

## 2023-03-13 DIAGNOSIS — F1193 Opioid use, unspecified with withdrawal: Secondary | ICD-10-CM

## 2023-03-13 DIAGNOSIS — R112 Nausea with vomiting, unspecified: Secondary | ICD-10-CM | POA: Diagnosis not present

## 2023-03-13 DIAGNOSIS — I110 Hypertensive heart disease with heart failure: Secondary | ICD-10-CM | POA: Insufficient documentation

## 2023-03-13 MED ORDER — CLONIDINE HCL 0.1 MG PO TABS: ORAL_TABLET | ORAL | 0 refills | Status: DC

## 2023-03-13 MED ORDER — PROMETHAZINE HCL 25 MG PO TABS: 25.0000 mg | ORAL_TABLET | Freq: Three times a day (TID) | ORAL | 0 refills | Status: DC | PRN

## 2023-03-13 NOTE — ED Provider Notes (Signed)
[place] EMERGENCY DEPARTMENT AT [facility] Provider Note   CSN: [id number] Arrival date & time: 03/13/23  1191     History  Chief Complaint  Patient presents with   Withdrawal    Tina Fletcher is a 37 y.o. female.  HPI Patient presents with after withdrawal from opiates.  States she was using opiate pills.  About 80 mg of oxycodone a day.  Has been off them since Friday.  States she got depressed and just wanted to stop them.  States her depression is cleared up.  Had been seen in the ER for same.  Has muscle aches nausea vomiting diarrhea.  Also some back pain.   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 complication    Hypertension    Medical history non-contributory    Migraines    Nexplanon insertion 08/13/2013   08/13/13 inserted left arm, remove 08/13/16   Vitamin D deficiency 04/01/2016    Home Medications Prior to Admission medications   Medication Sig Start Date End Date Taking? Authorizing Provider  cloNIDine (CATAPRES) 0.1 MG tablet Take 1 tablet (0.1 mg total) by mouth 2 (two) times daily for 2 days, THEN 1 tablet (0.1 mg total) daily for 2 days. 03/13/23 03/17/23 Yes Benjiman Core, MD  promethazine (PHENERGAN) 25 MG tablet Take 1 tablet (25 mg total) by mouth every 8 (eight) hours as needed for nausea or vomiting. 03/13/23  Yes Benjiman Core, MD  etonogestrel (NEXPLANON) 68 MG IMPL implant Inject 1 each into the skin continuous.    [provider]  ibuprofen (ADVIL) 800 MG tablet Take 1 tablet (800 mg total) by mouth every 8 (eight) hours as needed for moderate pain. 03/11/23   Bethann Berkshire, MD  Iron, Ferrous Sulfate, 325 (65 Fe) MG TABS Take 325 mg by mouth daily. 04/09/21   Anders Simmonds, PA-C  meclizine (ANTIVERT) 12.5 MG tablet Take 12.5 mg by mouth as needed for dizziness.    [provider]  metoprolol  succinate (TOPROL XL) 25 MG 24 hr tablet Take 0.5 tablets (12.5 mg total) by mouth at bedtime. 04/09/21 04/09/22  Anders Simmonds, PA-C  naproxen (NAPROSYN) 500 MG tablet Take 1 tablet (500 mg total) by mouth 2 (two) times daily. 03/16/22   Dione Booze, MD  omeprazole (PRILOSEC) 20 MG capsule TAKE (1) CAPSULE BY MOUTH EVERY DAY. 02/17/22   Jacklynn Ganong, FNP  ondansetron (ZOFRAN-ODT) 4 MG disintegrating tablet 4mg  ODT q4 hours prn nausea/vomit 03/11/23   Bethann Berkshire, MD  oxyCODONE (OXY IR/ROXICODONE) 5 MG immediate release tablet Take 5 mg by mouth 2 (two) times daily as needed. 01/04/22   [provider]  potassium chloride SA (KLOR-CON M) 20 MEQ tablet Take 1 tablet (20 mEq total) by mouth daily. 03/30/22   Laurey Morale, MD  sertraline (ZOLOFT) 50 MG tablet Take 1.5 tablets (75 mg total) by mouth daily. 03/30/22   Laurey Morale, MD  Vitamin D, Ergocalciferol, (DRISDOL) 1.25 MG (50000 UNIT) CAPS capsule Take 50,000 Units by mouth once a week. 04/30/21   [provider]      Allergies    Fentanyl    Review of Systems   Review of Systems  Physical Exam Updated Vital Signs BP 106/73 (BP Location: Left Arm)   Pulse (!) 104   Temp 98.1 F (36.7 C) (Oral)   Resp 19  Ht 5\' 9"  (1.753 m)   Wt 61.2 kg   SpO2 99%   BMI 19.94 kg/m  Physical Exam Vitals and nursing note reviewed.  Cardiovascular:     Rate and Rhythm: Tachycardia present.  Pulmonary:     Breath sounds: No wheezing.  Abdominal:     Tenderness: There is no abdominal tenderness.  Skin:    General: Skin is warm.  Neurological:     Mental Status: She is alert and oriented to person, place, and time.  Psychiatric:        Mood and Affect: Mood normal.     ED Results / Procedures / Treatments   Labs (all labs ordered are listed, but only abnormal results are displayed) Labs Reviewed - No data to display  EKG None  Radiology No results found.  Procedures Procedures    Medications  Ordered in ED Medications - No data to display  ED Course/ Medical Decision Making/ A&P                                 Medical Decision Making Risk Prescription drug management.   Patient opiate withdrawal.  Has had nausea vomiting diarrhea for the last couple days.  Benign exam.  Blood pressure in the room was 120 systolic.  Requesting medicines to help with her withdrawal so she can maintain her sobriety.  No other coingestants.  Will treat with Phenergan since she has already had Zofran without much relief.  Also will give Catapres with a taper to help with withdrawal.  Discussed with patient about potential IV fluids and more acute symptomatic treatment patient would rather just have the pills and go home.  This appears reasonable.  Will discharge.  Had been given follow-up instructions at visit 2 days ago.        Final Clinical Impression(s) / ED Diagnoses Final diagnoses:  Opiate withdrawal (HCC)    Rx / DC Orders ED Discharge Orders          Ordered    cloNIDine (CATAPRES) 0.1 MG tablet  Multiple Frequencies        03/13/23 1032    promethazine (PHENERGAN) 25 MG tablet  Every 8 hours PRN        03/13/23 1032              Benjiman Core, MD 03/13/23 1038

## 2023-03-13 NOTE — ED Triage Notes (Signed)
Pt is here for withdrawals for opiates. Last used Friday Morning, requesting medication to make her comfortable while going through withdrawals trying to come off completely.

## 2023-03-13 NOTE — ED Triage Notes (Signed)
Pt c/o difficulty with withdrawal symptoms from Oxycodone since Friday. Pt reports n/v/d and muscle aches.

## 2023-03-23 ENCOUNTER — Other Ambulatory Visit (HOSPITAL_COMMUNITY): Payer: Self-pay | Admitting: Family Medicine

## 2023-03-27 ENCOUNTER — Encounter (HOSPITAL_COMMUNITY): Payer: Self-pay

## 2023-03-27 ENCOUNTER — Emergency Department (HOSPITAL_COMMUNITY)
Admission: EM | Admit: 2023-03-27 | Discharge: 2023-03-28 | Discharge status: Against Medical Advice | Payer: 59 | Attending: Emergency Medicine | Admitting: Emergency Medicine

## 2023-03-27 ENCOUNTER — Other Ambulatory Visit: Payer: Self-pay

## 2023-03-27 ENCOUNTER — Emergency Department (HOSPITAL_COMMUNITY): Payer: 59

## 2023-03-27 DIAGNOSIS — Z5321 Procedure and treatment not carried out due to patient leaving prior to being seen by health care provider: Secondary | ICD-10-CM | POA: Diagnosis not present

## 2023-03-27 DIAGNOSIS — R079 Chest pain, unspecified: Secondary | ICD-10-CM | POA: Diagnosis present

## 2023-03-27 LAB — CBC
HCT: 31.9 % — ABNORMAL LOW (ref 36.0–46.0)
Hemoglobin: 10.3 g/dL — ABNORMAL LOW (ref 12.0–15.0)
MCH: 27.9 pg (ref 26.0–34.0)
MCHC: 32.3 g/dL (ref 30.0–36.0)
MCV: 86.4 fL (ref 80.0–100.0)
Platelets: 177 10*3/uL (ref 150–400)
RBC: 3.69 MIL/uL — ABNORMAL LOW (ref 3.87–5.11)
RDW: 12.6 % (ref 11.5–15.5)
WBC: 2.6 10*3/uL — ABNORMAL LOW (ref 4.0–10.5)
nRBC: 0 % (ref 0.0–0.2)

## 2023-03-27 LAB — BASIC METABOLIC PANEL
Anion gap: 10 (ref 5–15)
BUN: 9 mg/dL (ref 6–20)
CO2: 23 mmol/L (ref 22–32)
Calcium: 8.8 mg/dL — ABNORMAL LOW (ref 8.9–10.3)
Chloride: 103 mmol/L (ref 98–111)
Creatinine, Ser: 0.68 mg/dL (ref 0.44–1.00)
GFR, Estimated: >60 mL/min (ref >60)
Glucose, Bld: 107 mg/dL — ABNORMAL HIGH (ref 70–99)
Potassium: 3.5 mmol/L (ref 3.5–5.1)
Sodium: 136 mmol/L (ref 135–145)

## 2023-03-27 LAB — TROPONIN I (HIGH SENSITIVITY): Troponin I (High Sensitivity): 3 ng/L (ref <18)

## 2023-03-27 NOTE — ED Triage Notes (Signed)
Pt reports intermittent chest pain since Friday. Pt denies any other symptoms.

## 2023-03-27 NOTE — ED Notes (Signed)
Called x 1 no answer

## 2023-04-01 ENCOUNTER — Ambulatory Visit
Admission: EM | Admit: 2023-04-01 | Discharge: 2023-04-01 | Disposition: A | Discharge status: Home / Self Care | Payer: 59

## 2023-04-01 ENCOUNTER — Encounter: Payer: Self-pay | Admitting: Emergency Medicine

## 2023-04-01 DIAGNOSIS — R110 Nausea: Secondary | ICD-10-CM | POA: Diagnosis not present

## 2023-04-01 NOTE — ED Triage Notes (Signed)
Nausea since last night.  Has started suboxone 2 weeks ago and was told not to take her nausea medication

## 2023-04-01 NOTE — ED Provider Notes (Signed)
[facility]    CSN: [id number] Arrival date & time: 04/01/23  1029      History   Chief Complaint No chief complaint on file.   HPI Tina Fletcher is a 37 y.o. female.   Presenting today with 1 day history of nausea without vomiting.  Denies diarrhea, abdominal pain, fever, chills, urinary symptoms, upper respiratory symptoms.  Recently started on Suboxone and was told that nausea would be a side effect.  She was told not to take nausea medication such as Zofran with the medication so was wanting recommendations on what to take.  She took some over-the-counter meclizine this morning and states she is now feeling better since that kicked in.    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 complication    Hypertension    Medical history non-contributory    Migraines    Nexplanon insertion 08/13/2013   08/13/13 inserted left arm, remove 08/13/16   Vitamin D deficiency 04/01/2016    Patient Active Problem List   Diagnosis Date Noted   Anxiety disorder 08/06/2021   Hypertensive crisis 08/18/2020   Hypokalemia 08/18/2020   Prolonged QT interval 08/18/2020   Syncope    Chronic systolic CHF (congestive heart failure) (HCC)    Cervical cancer screening 10/23/2019   Systolic CHF, acute (HCC) 05/10/2019   Vitamin D deficiency 04/01/2016   Nexplanon insertion 08/13/2013   Thrombocytopenia (HCC) 04/17/2013   Underweight 01/08/2013    Past Surgical History:  Procedure Laterality Date   RIGHT/LEFT HEART CATH AND CORONARY ANGIOGRAPHY N/A 05/14/2019   Procedure: RIGHT/LEFT HEART CATH AND CORONARY ANGIOGRAPHY;  Surgeon: Laurey Morale, MD;  Location: [facility] INVASIVE CV LAB;  Service: Cardiovascular;  Laterality: N/A;    OB History     Gravida  2   Para  2   Term  2   Preterm      AB      Living  2      SAB      IAB      Ectopic       Multiple      Live Births  2            Home Medications    Prior to Admission medications   Medication Sig Start Date End Date Taking? Authorizing Provider  Buprenorphine HCl-Naloxone HCl (SUBOXONE) 4-1 MG FILM Place 4 Film under the tongue in the morning, at noon, and at bedtime.   Yes [provider]  etonogestrel (NEXPLANON) 68 MG IMPL implant Inject 1 each into the skin continuous.    [provider]  ibuprofen (ADVIL) 800 MG tablet Take 1 tablet (800 mg total) by mouth every 8 (eight) hours as needed for moderate pain. 03/11/23   Bethann Berkshire, MD  Iron, Ferrous Sulfate, 325 (65 Fe) MG TABS Take 325 mg by mouth daily. 04/09/21   Anders Simmonds, PA-C  meclizine (ANTIVERT) 12.5 MG tablet Take 12.5 mg by mouth as needed for dizziness.    [provider]  metoprolol succinate (TOPROL XL) 25 MG 24 hr tablet Take 0.5 tablets (12.5 mg total) by mouth at bedtime. 04/09/21 04/09/22  Anders Simmonds, PA-C  omeprazole (PRILOSEC) 20 MG capsule TAKE (1) CAPSULE BY MOUTH EVERY DAY. 03/24/23   [place], Anderson Malta, FNP  potassium chloride SA (KLOR-CON M) 20 MEQ tablet Take 1 tablet (20 mEq total) by  mouth daily. 03/30/22   Laurey Morale, MD  sertraline (ZOLOFT) 50 MG tablet Take 1.5 tablets (75 mg total) by mouth daily. 03/30/22   Laurey Morale, MD    Family History Family History  Problem Relation Age of Onset   Diabetes Maternal Grandmother    Cancer Paternal Grandfather        prancreatic   COPD Maternal Grandfather    Cancer Maternal Grandfather        prostate   Asthma Maternal Grandfather    Asthma Son    Bronchitis Son     Social History Social History   Tobacco Use   Smoking status: Never   Smokeless tobacco: Never  Vaping Use   Vaping status: Never Used  Substance Use Topics   Alcohol use: Not Currently   Drug use: Not Currently    Comment: reports was using 80mg  of Oxycodone up until 03/11/23, but has now stopped completely      Allergies   Fentanyl   Review of Systems Review of Systems Per HPI  Physical Exam Triage Vital Signs ED Triage Vitals  Encounter Vitals Group     BP 04/01/23 1039 132/86     Systolic BP Percentile --      Diastolic BP Percentile --      Pulse Rate 04/01/23 1039 (!) 103     Resp 04/01/23 1039 18     Temp 04/01/23 1039 98.6 F (37 C)     Temp Source 04/01/23 1039 Oral     SpO2 04/01/23 1039 99 %     Weight --      Height --      Head Circumference --      Peak Flow --      Pain Score 04/01/23 1041 0     Pain Loc --      Pain Education --      Exclude from Growth Chart --    No data found.  Updated Vital Signs BP 132/86 (BP Location: Right Arm)   Pulse (!) 103   Temp 98.6 F (37 C) (Oral)   Resp 18   LMP 03/07/2023 (Approximate)   SpO2 99%   Visual Acuity Right Eye Distance:   Left Eye Distance:   Bilateral Distance:    Right Eye Near:   Left Eye Near:    Bilateral Near:     Physical Exam Vitals and nursing note reviewed.  Constitutional:      Appearance: Normal appearance. She is not ill-appearing.  HENT:     Head: Atraumatic.  Eyes:     Extraocular Movements: Extraocular movements intact.     Conjunctiva/sclera: Conjunctivae normal.  Cardiovascular:     Rate and Rhythm: Normal rate and regular rhythm.     Heart sounds: Normal heart sounds.  Pulmonary:     Effort: Pulmonary effort is normal.     Breath sounds: Normal breath sounds.  Abdominal:     General: Bowel sounds are normal. There is no distension.     Palpations: Abdomen is soft.     Tenderness: There is no abdominal tenderness. There is no right CVA tenderness, left CVA tenderness or guarding.  Musculoskeletal:        General: Normal range of motion.     Cervical back: Normal range of motion and neck supple.  Skin:    General: Skin is warm and dry.  Neurological:     Mental Status: She is alert and oriented to person, place, and time.  Psychiatric:        Mood and Affect: Mood  normal.        Thought Content: Thought content normal.        Judgment: Judgment normal.      UC Treatments / Results  Labs (all labs ordered are listed, but only abnormal results are displayed) Labs Reviewed - No data to display  EKG   Radiology No results found.  Procedures Procedures (including critical care time)  Medications Ordered in UC Medications - No data to display  Initial Impression / Assessment and Plan / UC Course  I have reviewed the triage vital signs and the nursing notes.  Pertinent labs & imaging results that were available during my care of the patient were reviewed by me and considered in my medical decision making (see chart for details).     Per patient, symptoms have resolved since the meclizine has kicked in that she took this morning.  May continue 1/2-1 full tab of the over-the-counter meclizine as needed for nausea and follow-up with prescribing Suboxone provider for further recommendations.  Did discuss to not drive or take any other sedating medications alongside this medication.  Final Clinical Impressions(s) / UC Diagnoses   Final diagnoses:  Nausea without vomiting     Discharge Instructions      Continue half to full tab of 12.5 mg meclizine 3 times daily as needed for nausea.  Follow-up with your prescribing provider for further recommendations.  I recommend not driving while taking the meclizine as it can be very sedating.  Do not take any other sedating medications at the same time.    ED Prescriptions   None    PDMP not reviewed this encounter.   Particia Nearing, [place] 04/01/23 1153

## 2023-04-01 NOTE — Discharge Instructions (Signed)
Continue half to full tab of 12.5 mg meclizine 3 times daily as needed for nausea.  Follow-up with your prescribing provider for further recommendations.  I recommend not driving while taking the meclizine as it can be very sedating.  Do not take any other sedating medications at the same time.

## 2023-05-30 ENCOUNTER — Other Ambulatory Visit (HOSPITAL_COMMUNITY): Payer: Self-pay

## 2023-07-22 ENCOUNTER — Other Ambulatory Visit: Payer: Self-pay | Admitting: Orthopedic Surgery

## 2023-07-22 DIAGNOSIS — G8929 Other chronic pain: Secondary | ICD-10-CM

## 2023-08-31 ENCOUNTER — Other Ambulatory Visit (HOSPITAL_COMMUNITY): Payer: Self-pay | Admitting: *Deleted

## 2023-08-31 MED ORDER — OMEPRAZOLE 20 MG PO CPDR: 20.0000 mg | DELAYED_RELEASE_CAPSULE | Freq: Every day | ORAL | 0 refills | Status: AC

## 2023-09-29 ENCOUNTER — Encounter: Admitting: Women's Health

## 2023-11-04 ENCOUNTER — Encounter (HOSPITAL_COMMUNITY): Payer: Self-pay

## 2023-11-04 ENCOUNTER — Other Ambulatory Visit: Payer: Self-pay

## 2023-11-04 ENCOUNTER — Emergency Department (HOSPITAL_COMMUNITY)
Admission: EM | Admit: 2023-11-04 | Discharge: 2023-11-04 | Disposition: A | Discharge status: Home / Self Care | Attending: Emergency Medicine | Admitting: Emergency Medicine

## 2023-11-04 DIAGNOSIS — E876 Hypokalemia: Secondary | ICD-10-CM | POA: Diagnosis not present

## 2023-11-04 DIAGNOSIS — I509 Heart failure, unspecified: Secondary | ICD-10-CM | POA: Diagnosis not present

## 2023-11-04 DIAGNOSIS — R42 Dizziness and giddiness: Secondary | ICD-10-CM | POA: Insufficient documentation

## 2023-11-04 DIAGNOSIS — I110 Hypertensive heart disease with heart failure: Secondary | ICD-10-CM | POA: Insufficient documentation

## 2023-11-04 LAB — CBC
HCT: 29.7 % — ABNORMAL LOW (ref 36.0–46.0)
Hemoglobin: 9.7 g/dL — ABNORMAL LOW (ref 12.0–15.0)
MCH: 27 pg (ref 26.0–34.0)
MCHC: 32.7 g/dL (ref 30.0–36.0)
MCV: 82.7 fL (ref 80.0–100.0)
Platelets: 173 10*3/uL (ref 150–400)
RBC: 3.59 MIL/uL — ABNORMAL LOW (ref 3.87–5.11)
RDW: 13.3 % (ref 11.5–15.5)
WBC: 4 10*3/uL (ref 4.0–10.5)
nRBC: 0 % (ref 0.0–0.2)

## 2023-11-04 LAB — BASIC METABOLIC PANEL WITH GFR
Anion gap: 6 (ref 5–15)
BUN: 11 mg/dL (ref 6–20)
CO2: 25 mmol/L (ref 22–32)
Calcium: 8.5 mg/dL — ABNORMAL LOW (ref 8.9–10.3)
Chloride: 106 mmol/L (ref 98–111)
Creatinine, Ser: 0.86 mg/dL (ref 0.44–1.00)
GFR, Estimated: >60 mL/min (ref >60)
Glucose, Bld: 117 mg/dL — ABNORMAL HIGH (ref 70–99)
Potassium: 3 mmol/L — ABNORMAL LOW (ref 3.5–5.1)
Sodium: 137 mmol/L (ref 135–145)

## 2023-11-04 LAB — MAGNESIUM: Magnesium: 1.8 mg/dL (ref 1.7–2.4)

## 2023-11-04 LAB — PREGNANCY, URINE: Preg Test, Ur: NEGATIVE

## 2023-11-04 MED ORDER — POTASSIUM CHLORIDE CRYS ER 20 MEQ PO TBCR
40.0000 meq | EXTENDED_RELEASE_TABLET | Freq: Once | ORAL | Status: AC
  Administered 2023-11-04: 40 meq via ORAL
  Filled 2023-11-04: qty 2

## 2023-11-04 NOTE — ED Triage Notes (Signed)
 Pov from home. Cc of HTN. Says that she was about to go to bed just prior to arrival when she felt like she needed to burp. Said that she couldn't but then felt dizzy for a bit. Decided to check her blood pressure and it was 158/110. Dizziness went away .  138/93 during triage.  HR 120

## 2023-11-04 NOTE — ED Notes (Signed)
 ED Provider at bedside.

## 2023-11-04 NOTE — ED Provider Notes (Signed)
 [place] EMERGENCY DEPARTMENT AT [facility] Provider Note   CSN: [id number] Arrival date & time: 11/04/23  0115     History  Chief Complaint  Patient presents with   Hypertension    Tina Fletcher is a 38 y.o. female.  The history is provided by the patient.   Patient with history of opioid use disorder, hypertension presents for elevated blood pressure that is improving Patient reports that she tried to burp earlier and soon after felt dizzy and checked her blood pressure and it was elevated.  No syncope.  She was able to ambulate to the restroom without difficulty.  She reports diarrhea but no vomiting  No chest pain or shortness of breath.  She is now feeling back to baseline. She reports she has been well-maintained on buprenorphine, but her physician recently increased it to 8 mg She also reports a history of hypokalemia and takes potassium at home She reports her blood pressure at home was 158/110, but that is improving 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 complication    Hypertension    Medical history non-contributory    Migraines    Nexplanon  insertion 08/13/2013   08/13/13 inserted left arm, remove 08/13/16   Vitamin D  deficiency 04/01/2016    Home Medications Prior to Admission medications   Medication Sig Start Date End Date Taking? Authorizing Provider  Buprenorphine HCl-Naloxone HCl (SUBOXONE) 4-1 MG FILM Place 4 Film under the tongue in the morning, at noon, and at bedtime.    [provider]  etonogestrel  (NEXPLANON ) 68 MG IMPL implant Inject 1 each into the skin continuous.    [provider]  ibuprofen  (ADVIL ) 800 MG tablet Take 1 tablet (800 mg total) by mouth every 8 (eight) hours as needed for moderate pain. 03/11/23   Cheyenne Cotta, MD  Iron , Ferrous Sulfate , 325 (65 Fe) MG TABS Take 325 mg by mouth daily.  04/09/21   Hassie Lint, PA-C  meclizine (ANTIVERT) 12.5 MG tablet Take 12.5 mg by mouth as needed for dizziness.    [provider]  metoprolol  succinate (TOPROL  XL) 25 MG 24 hr tablet Take 0.5 tablets (12.5 mg total) by mouth at bedtime. 04/09/21 04/09/22  Hassie Lint, PA-C  omeprazole  (PRILOSEC) 20 MG capsule Take 1 capsule (20 mg total) by mouth daily. 08/31/23   Darlis Eisenmenger, MD  potassium chloride  SA (KLOR-CON  M) 20 MEQ tablet Take 1 tablet (20 mEq total) by mouth daily. 03/30/22   Darlis Eisenmenger, MD  sertraline  (ZOLOFT ) 50 MG tablet Take 1.5 tablets (75 mg total) by mouth daily. 03/30/22   Darlis Eisenmenger, MD      Allergies    Fentanyl     Review of Systems   Review of Systems  Constitutional:  Negative for fever.  Respiratory:  Negative for shortness of breath.   Cardiovascular:  Negative for chest pain.  Gastrointestinal:  Positive for diarrhea. Negative for vomiting.  Neurological:  Positive for light-headedness. Negative for syncope.    Physical Exam Updated Vital Signs BP 114/82   Pulse 98   Temp (!) 97.5 F (36.4 C)   Resp 20   Ht 1.753 m (5\' 9" )   Wt 62 kg   SpO2 95%   BMI 20.18 kg/m  Physical Exam CONSTITUTIONAL: Well developed/well nourished HEAD: Normocephalic/atraumatic EYES: EOMI/PERRL ENMT: Mucous membranes moist NECK: supple no meningeal  signs SPINE/BACK:entire spine nontender CV: S1/S2 noted, no murmurs/rubs/gallops noted LUNGS: Lungs are clear to auscultation bilaterally, no apparent distress ABDOMEN: soft, nontender NEURO: Pt is awake/alert/appropriate, moves all extremitiesx4.  No facial droop.   EXTREMITIES: pulses normal/equal, full ROM SKIN: warm, color normal PSYCH: no abnormalities of mood noted, alert and oriented to situation  ED Results / Procedures / Treatments   Labs (all labs ordered are listed, but only abnormal results are displayed) Labs Reviewed  CBC - Abnormal; Notable for the following components:       Result Value   RBC 3.59 (*)    Hemoglobin 9.7 (*)    HCT 29.7 (*)    All other components within normal limits  BASIC METABOLIC PANEL WITH GFR - Abnormal; Notable for the following components:   Potassium 3.0 (*)    Glucose, Bld 117 (*)    Calcium 8.5 (*)    All other components within normal limits  MAGNESIUM   PREGNANCY, URINE    EKG EKG Interpretation Date/Time:  Friday Nov 04 2023 01:34:19 EDT Ventricular Rate:  112 PR Interval:  178 QRS Duration:  79 QT Interval:  314 QTC Calculation: 429 R Axis:   65  Text Interpretation: Sinus tachycardia Borderline repolarization abnormality Confirmed by Eldon Greenland ([id number]) on 11/04/2023 1:40:28 AM  Radiology No results found.  Procedures Procedures    Medications Ordered in ED Medications  potassium chloride  SA (KLOR-CON  M) CR tablet 40 mEq (has no administration in time range)    ED Course/ Medical Decision Making/ A&P Clinical Course as of 11/04/23 0322  Fri Nov 04, 2023  0322 Chronic anemia noted [DW]  0322 Hemoglobin(!): 9.7 [DW]  0322 Potassium(!): 3.0 Mild hypokalemia [DW]    Clinical Course User Index [DW] Eldon Greenland, MD                                 Medical Decision Making Amount and/or Complexity of Data Reviewed Labs: ordered. Decision-making details documented in ED Course. ECG/medicine tests: ordered.  Risk Prescription drug management.   This patient presents to the ED for concern of dizziness, this involves an extensive number of treatment options, and is a complaint that carries with it a high risk of complications and morbidity.  The differential diagnosis includes but is not limited to vertigo, cardiac dysrhythmia, CVA, electrolyte imbalance, orthostatic hypotension  Comorbidities that complicate the patient evaluation: Patient's presentation is complicated by their history of hypertension  Social Determinants of Health: Patient's opioid use disorder  increases the complexity of  managing their presentation  Additional history obtained: Records reviewed outpatient records reviewed  Lab Tests: I Ordered, and personally interpreted labs.  The pertinent results include: Anemia, mild hypokalemia  Cardiac Monitoring: The patient was maintained on a cardiac monitor.  I personally viewed and interpreted the cardiac monitor which showed an underlying rhythm of:  sinus rhythm  Medicines ordered and prescription drug management: I ordered medication including oral potassium for mild hypokalemia  Reevaluation: After the interventions noted above, I reevaluated the patient and found that they have :stayed the same  Complexity of problems addressed: Patient's presentation is most consistent with  acute presentation with potential threat to life or bodily function  Disposition: After consideration of the diagnostic results and the patient's response to treatment,  I feel that the patent would benefit from discharge  .           Final Clinical Impression(s) / ED  Diagnoses Final diagnoses:  Dizziness    Rx / DC Orders ED Discharge Orders     None         Eldon Greenland, MD 11/04/23 [phone]

## 2023-11-04 NOTE — Discharge Instructions (Signed)
   RETURN IMMEDIATELY IF  you develop new shortness of breath, chest pain, fever, have difficulty moving parts of your body (new weakness, numbness, or incoordination), sudden change in speech, vision, swallowing, or understanding, faint or develop new dizziness, severe headache, become poorly responsive or have an altered mental status compared to baseline for you, new rash, abdominal pain, or bloody stools,  Return sooner also if you develop new problems for which you have not talked to your caregiver but you feel may be emergency medical conditions.

## 2023-11-19 ENCOUNTER — Emergency Department (HOSPITAL_COMMUNITY)

## 2023-11-19 ENCOUNTER — Emergency Department (HOSPITAL_COMMUNITY)
Admission: EM | Admit: 2023-11-19 | Discharge: 2023-11-19 | Disposition: A | Discharge status: Home / Self Care | Attending: Emergency Medicine | Admitting: Emergency Medicine

## 2023-11-19 ENCOUNTER — Other Ambulatory Visit: Payer: Self-pay

## 2023-11-19 ENCOUNTER — Encounter (HOSPITAL_COMMUNITY): Payer: Self-pay | Admitting: *Deleted

## 2023-11-19 DIAGNOSIS — R0789 Other chest pain: Secondary | ICD-10-CM | POA: Diagnosis not present

## 2023-11-19 DIAGNOSIS — R911 Solitary pulmonary nodule: Secondary | ICD-10-CM | POA: Diagnosis not present

## 2023-11-19 DIAGNOSIS — R079 Chest pain, unspecified: Secondary | ICD-10-CM | POA: Diagnosis not present

## 2023-11-19 DIAGNOSIS — I509 Heart failure, unspecified: Secondary | ICD-10-CM | POA: Diagnosis not present

## 2023-11-19 LAB — CBC
HCT: 35.7 % — ABNORMAL LOW (ref 36.0–46.0)
Hemoglobin: 11.3 g/dL — ABNORMAL LOW (ref 12.0–15.0)
MCH: 26.5 pg (ref 26.0–34.0)
MCHC: 31.7 g/dL (ref 30.0–36.0)
MCV: 83.8 fL (ref 80.0–100.0)
Platelets: 194 10*3/uL (ref 150–400)
RBC: 4.26 MIL/uL (ref 3.87–5.11)
RDW: 13.6 % (ref 11.5–15.5)
WBC: 3.4 10*3/uL — ABNORMAL LOW (ref 4.0–10.5)
nRBC: 0 % (ref 0.0–0.2)

## 2023-11-19 LAB — URINALYSIS, ROUTINE W REFLEX MICROSCOPIC
Bilirubin Urine: NEGATIVE
Glucose, UA: NEGATIVE mg/dL
Hgb urine dipstick: NEGATIVE
Ketones, ur: NEGATIVE mg/dL
Nitrite: NEGATIVE
Protein, ur: 30 mg/dL — AB
Specific Gravity, Urine: 1.026 (ref 1.005–1.030)
pH: 5 (ref 5.0–8.0)

## 2023-11-19 LAB — BASIC METABOLIC PANEL WITH GFR
Anion gap: 6 (ref 5–15)
BUN: 11 mg/dL (ref 6–20)
CO2: 26 mmol/L (ref 22–32)
Calcium: 9 mg/dL (ref 8.9–10.3)
Chloride: 104 mmol/L (ref 98–111)
Creatinine, Ser: 0.79 mg/dL (ref 0.44–1.00)
GFR, Estimated: >60 mL/min (ref >60)
Glucose, Bld: 107 mg/dL — ABNORMAL HIGH (ref 70–99)
Potassium: 3.3 mmol/L — ABNORMAL LOW (ref 3.5–5.1)
Sodium: 136 mmol/L (ref 135–145)

## 2023-11-19 LAB — D-DIMER, QUANTITATIVE: D-Dimer, Quant: 0.49 ug{FEU}/mL (ref 0.00–0.50)

## 2023-11-19 LAB — TROPONIN I (HIGH SENSITIVITY): Troponin I (High Sensitivity): 2 ng/L (ref <18)

## 2023-11-19 LAB — HCG, QUANTITATIVE, PREGNANCY: hCG, Beta Chain, Quant, S: <1 m[IU]/mL (ref <5)

## 2023-11-19 MED ORDER — LISINOPRIL-HYDROCHLOROTHIAZIDE 20-12.5 MG PO TABS: 1.0000 | ORAL_TABLET | Freq: Every day | ORAL | 1 refills | Status: DC

## 2023-11-19 MED ORDER — NAPROXEN 250 MG PO TABS
500.0000 mg | ORAL_TABLET | Freq: Once | ORAL | Status: AC
  Administered 2023-11-19: 500 mg via ORAL
  Filled 2023-11-19: qty 2

## 2023-11-19 NOTE — Discharge Instructions (Addendum)
 Testing is totally normal, there is no signs of pneumonia, no signs of heart attack, no signs of any heart problems whatsoever.  I do want you to follow-up with your family doctor this week, if you develop severe or worsening symptoms return to the ER.

## 2023-11-19 NOTE — ED Provider Notes (Signed)
 [place] EMERGENCY DEPARTMENT AT [facility] Provider Note   CSN: [id number] Arrival date & time: 11/19/23  1127     History  Chief Complaint  Patient presents with   Chest Pain    Tina Fletcher is a 38 y.o. female.   Chest Pain  This patient is a 38 year old female, presents with a complaint of chest pain on the right side, she reports that this started about a week ago, it is intermittent, it started after drinking an Ensure shake.  It is not exertional or positional or related to deep breathing.  She has no fevers chills nausea or vomiting, she coughs occasionally but not consistently, nobody around her has been sick and she has not been traveling or she had recent surgery, she takes no hormone pills and has never had cancer or blood clots.  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    Home Medications Prior to Admission medications   Medication Sig Start Date End Date Taking? Authorizing Provider  Buprenorphine HCl-Naloxone HCl (SUBOXONE) 4-1 MG FILM Place 4 Film under the tongue in the morning, at noon, and at bedtime.    [provider]  etonogestrel  (NEXPLANON ) 68 MG IMPL implant Inject 1 each into the skin continuous.    [provider]  ibuprofen  (ADVIL ) 800 MG tablet Take 1 tablet (800 mg total) by mouth every 8 (eight) hours as needed for moderate pain. 03/11/23   Cheyenne Cotta, MD  Iron , Ferrous Sulfate , 325 (65 Fe) MG TABS Take 325 mg by mouth daily. 04/09/21   Hassie Lint, PA-C  meclizine (ANTIVERT) 12.5 MG tablet Take 12.5 mg by mouth as needed for dizziness.    [provider]  metoprolol   succinate (TOPROL  XL) 25 MG 24 hr tablet Take 0.5 tablets (12.5 mg total) by mouth at bedtime. 04/09/21 04/09/22  Hassie Lint, PA-C  omeprazole  (PRILOSEC) 20 MG capsule Take 1 capsule (20 mg total) by mouth daily. 08/31/23   Darlis Eisenmenger, MD  potassium chloride  SA (KLOR-CON  M) 20 MEQ tablet Take 1 tablet (20 mEq total) by mouth daily. 03/30/22   Darlis Eisenmenger, MD  sertraline  (ZOLOFT ) 50 MG tablet Take 1.5 tablets (75 mg total) by mouth daily. 03/30/22   Darlis Eisenmenger, MD      Allergies    Fentanyl     Review of Systems   Review of Systems  Cardiovascular:  Positive for chest pain.  All other systems reviewed and are negative.   Physical Exam Updated Vital Signs BP 129/88 (BP Location: Right Arm)   Pulse (!) 118   Temp 97.6 F (36.4 C) (Temporal)   Resp 18   Ht 1.753 m (5\' 9" )   Wt 63.5 kg   SpO2 100%   BMI 20.67 kg/m  Physical Exam Vitals and nursing note reviewed.  Constitutional:      General: She is not in acute distress.    Appearance: She is well-developed.  HENT:     Head: Normocephalic and atraumatic.     Mouth/Throat:     Pharynx: No oropharyngeal exudate.  Eyes:     General: No scleral icterus.  Right eye: No discharge.        Left eye: No discharge.     Conjunctiva/sclera: Conjunctivae normal.     Pupils: Pupils are equal, round, and reactive to light.  Neck:     Thyroid : No thyromegaly.     Vascular: No JVD.  Cardiovascular:     Rate and Rhythm: Normal rate and regular rhythm.     Heart sounds: Normal heart sounds. No murmur heard.    No friction rub. No gallop.  Pulmonary:     Effort: Pulmonary effort is normal. No respiratory distress.     Breath sounds: Normal breath sounds. No wheezing or rales.  Abdominal:     General: Bowel sounds are normal. There is no distension.     Palpations: Abdomen is soft. There is no mass.     Tenderness: There is no abdominal tenderness.  Musculoskeletal:        General: No tenderness. Normal range  of motion.     Cervical back: Normal range of motion and neck supple.  Lymphadenopathy:     Cervical: No cervical adenopathy.  Skin:    General: Skin is warm and dry.     Findings: No erythema or rash.  Neurological:     Mental Status: She is alert.     Coordination: Coordination normal.  Psychiatric:        Behavior: Behavior normal.     ED Results / Procedures / Treatments   Labs (all labs ordered are listed, but only abnormal results are displayed) Labs Reviewed  BASIC METABOLIC PANEL WITH GFR - Abnormal; Notable for the following components:      Result Value   Potassium 3.3 (*)    Glucose, Bld 107 (*)    All other components within normal limits  CBC - Abnormal; Notable for the following components:   WBC 3.4 (*)    Hemoglobin 11.3 (*)    HCT 35.7 (*)    All other components within normal limits  URINALYSIS, ROUTINE W REFLEX MICROSCOPIC - Abnormal; Notable for the following components:   APPearance CLOUDY (*)    Protein, ur 30 (*)    Leukocytes,Ua MODERATE (*)    Bacteria, UA RARE (*)    All other components within normal limits  HCG, QUANTITATIVE, PREGNANCY  D-DIMER, QUANTITATIVE  TROPONIN I (HIGH SENSITIVITY)    EKG EKG Interpretation Date/Time:  Saturday Nov 19 2023 11:57:03 EDT Ventricular Rate:  115 PR Interval:  172 QRS Duration:  98 QT Interval:  310 QTC Calculation: 428 R Axis:   76  Text Interpretation: Sinus tachycardia Right atrial enlargement ST & T wave abnormality, consider inferior ischemia ST & T wave abnormality, consider anterolateral ischemia Abnormal ECG When compared with ECG of 04-Nov-2023 01:34, PREVIOUS ECG IS PRESENT since last tracing no significant change Confirmed by Early Glisson ([id number]) on 11/19/2023 12:40:01 PM  Radiology DG Chest 2 View Result Date: 11/19/2023 CLINICAL DATA:  Chest pain EXAM: CHEST - 2 VIEW COMPARISON:  X-ray 03/27/2023. FINDINGS: No consolidation, pneumothorax or effusion. No edema. Normal cardiopericardial  silhouette. There is a slightly nodular reticular appearance to the upper lung zones. This could be technical. Overlapping cardiac leads. IMPRESSION: Slight nodular reticular appearance to the upper lung zones, new from previous. This could be technical. Please correlate with symptoms. Recommend follow-up x-ray versus CT as clinically directed. Electronically Signed   By: Adrianna Horde M.D.   On: 11/19/2023 12:48    Procedures Procedures    Medications Ordered in ED Medications  naproxen  (NAPROSYN ) tablet 500 mg (500 mg Oral Given 11/19/23 1250)    ED Course/ Medical Decision Making/ A&P                                 Medical Decision Making Amount and/or Complexity of Data Reviewed Labs: ordered. Radiology: ordered.  Risk Prescription drug management.    This patient presents to the ED for concern of chest pain, this involves an extensive number of treatment options, and is a complaint that carries with it a high risk of complications and morbidity.  The differential diagnosis includes pneumothorax, pleurisy, MI, seems unlikely to be PE but the patient is slightly tachycardic, on my exam she is about 95 bpm   Co morbidities that complicate the patient evaluation  Prior history of congestive heart failure, seems to be alcohol related and seems to have improved after she stopped alcohol   Additional history obtained:  Additional history obtained from medical record External records from outside source obtained and reviewed including congestive heart failure, prior echocardiogram, see history   Lab Tests:  I Ordered, and personally interpreted labs.  The pertinent results include: D-dimer negative, troponin negative, CBC and metabolic panel unremarkable   Imaging Studies ordered:  I ordered imaging studies including chest x-ray I independently visualized and interpreted imaging which showed no specific abnormal findings, there is a nodular appearance to the upper lung  zones but nothing that is obvious and nothing that she needs emergent follow-up for I agree with the radiologist interpretation   Cardiac Monitoring: / EKG:  The patient was maintained on a cardiac monitor.  I personally viewed and interpreted the cardiac monitored which showed an underlying rhythm of: Normal sinus rhythm   Problem List / ED Course / Critical interventions / Medication management  Patient was informed of her results, negative Trope and D-dimer, ongoing symptoms, well-appearing  I have reviewed the patients home medicines and have made adjustments as needed    Social Determinants of Health:  Substance abuse history   Test / Admission - Considered:  Considered admission but patient has totally normal workup and is low risk for acute coronary syndrome         Final Clinical Impression(s) / ED Diagnoses Final diagnoses:  Right-sided chest pain    Rx / DC Orders ED Discharge Orders          Ordered    lisinopril -hydrochlorothiazide (ZESTORETIC) 20-12.5 MG tablet  Daily,   Status:  Discontinued        11/19/23 1414              Early Glisson, MD 11/19/23 1416

## 2023-11-19 NOTE — ED Triage Notes (Signed)
 Pt with mid to right sided CP since this morning. Denies SOB.  + cough.

## 2023-11-24 ENCOUNTER — Emergency Department (HOSPITAL_COMMUNITY)

## 2023-11-24 ENCOUNTER — Emergency Department (HOSPITAL_COMMUNITY)
Admission: EM | Admit: 2023-11-24 | Discharge: 2023-11-25 | Disposition: A | Discharge status: Home / Self Care | Attending: Emergency Medicine | Admitting: Emergency Medicine

## 2023-11-24 ENCOUNTER — Encounter (HOSPITAL_COMMUNITY): Payer: Self-pay

## 2023-11-24 ENCOUNTER — Other Ambulatory Visit: Payer: Self-pay

## 2023-11-24 DIAGNOSIS — R079 Chest pain, unspecified: Secondary | ICD-10-CM | POA: Diagnosis not present

## 2023-11-24 DIAGNOSIS — I509 Heart failure, unspecified: Secondary | ICD-10-CM | POA: Diagnosis not present

## 2023-11-24 DIAGNOSIS — I38 Endocarditis, valve unspecified: Secondary | ICD-10-CM | POA: Diagnosis not present

## 2023-11-24 DIAGNOSIS — R0789 Other chest pain: Secondary | ICD-10-CM | POA: Diagnosis not present

## 2023-11-24 DIAGNOSIS — R918 Other nonspecific abnormal finding of lung field: Secondary | ICD-10-CM | POA: Diagnosis not present

## 2023-11-24 DIAGNOSIS — D649 Anemia, unspecified: Secondary | ICD-10-CM | POA: Diagnosis not present

## 2023-11-24 DIAGNOSIS — Z7689 Persons encountering health services in other specified circumstances: Secondary | ICD-10-CM | POA: Diagnosis not present

## 2023-11-24 DIAGNOSIS — I10 Essential (primary) hypertension: Secondary | ICD-10-CM | POA: Diagnosis not present

## 2023-11-24 LAB — CBC
HCT: 34.3 % — ABNORMAL LOW (ref 36.0–46.0)
Hemoglobin: 11.3 g/dL — ABNORMAL LOW (ref 12.0–15.0)
MCH: 27.4 pg (ref 26.0–34.0)
MCHC: 32.9 g/dL (ref 30.0–36.0)
MCV: 83.3 fL (ref 80.0–100.0)
Platelets: 206 10*3/uL (ref 150–400)
RBC: 4.12 MIL/uL (ref 3.87–5.11)
RDW: 13.5 % (ref 11.5–15.5)
WBC: 4.3 10*3/uL (ref 4.0–10.5)
nRBC: 0 % (ref 0.0–0.2)

## 2023-11-24 LAB — BASIC METABOLIC PANEL WITH GFR
Anion gap: 11 (ref 5–15)
BUN: 8 mg/dL (ref 6–20)
CO2: 24 mmol/L (ref 22–32)
Calcium: 9.5 mg/dL (ref 8.9–10.3)
Chloride: 102 mmol/L (ref 98–111)
Creatinine, Ser: 0.74 mg/dL (ref 0.44–1.00)
GFR, Estimated: >60 mL/min (ref >60)
Glucose, Bld: 102 mg/dL — ABNORMAL HIGH (ref 70–99)
Potassium: 3.5 mmol/L (ref 3.5–5.1)
Sodium: 137 mmol/L (ref 135–145)

## 2023-11-24 LAB — TROPONIN I (HIGH SENSITIVITY): Troponin I (High Sensitivity): 4 ng/L (ref <18)

## 2023-11-24 NOTE — ED Triage Notes (Signed)
 Pt reports:  Chest pain Ongoing "for a while" Saw PCP today Told to come in if worsening CP Told she had a heart murmur Comes and goes Gets worse when laying down Central chest

## 2023-11-25 ENCOUNTER — Encounter (HOSPITAL_COMMUNITY)

## 2023-11-25 ENCOUNTER — Telehealth (HOSPITAL_COMMUNITY): Payer: Self-pay

## 2023-11-25 NOTE — Telephone Encounter (Signed)
 Called to confirm/remind patient of their appointment [facility] on 6/2/202512:00.   Appointment:   [x] Confirmed  [] Left mess   [] No answer/No voice mail  [] VM Full/unable to leave message  [] Phone not in service  Patient reminded to bring all medications and/or complete list.  Confirmed patient has transportation. Gave directions, instructed to utilize valet parking.

## 2023-11-25 NOTE — ED Provider Notes (Signed)
 [place] EMERGENCY DEPARTMENT AT [facility] Provider Note   CSN: [id number] Arrival date & time: 11/24/23  2204     History  Chief Complaint  Patient presents with   Chest Pain    Tina Fletcher is a 38 y.o. female.  Patient is a 38 year old female with past medical history of cardiomyopathy/CHF.  Patient presenting today for evaluation of chest discomfort.  She describes a 1 week history of intermittent sharp pains in the center of her chest.  This seems to be worse with lying flat.  She denies any shortness of breath, nausea, diaphoresis, or radiation to the arm or jaw.  No fevers or chills.  Patient has had extensive cardiac workup in the past including a heart cath showing normal coronary arteries.       Home Medications Prior to Admission medications   Medication Sig Start Date End Date Taking? Authorizing Provider  Buprenorphine HCl-Naloxone HCl (SUBOXONE) 4-1 MG FILM Place 4 Film under the tongue in the morning, at noon, and at bedtime.    [provider]  etonogestrel  (NEXPLANON ) 68 MG IMPL implant Inject 1 each into the skin continuous.    [provider]  ibuprofen  (ADVIL ) 800 MG tablet Take 1 tablet (800 mg total) by mouth every 8 (eight) hours as needed for moderate pain. 03/11/23   Cheyenne Cotta, MD  Iron , Ferrous Sulfate , 325 (65 Fe) MG TABS Take 325 mg by mouth daily. 04/09/21   Hassie Lint, PA-C  meclizine (ANTIVERT) 12.5 MG tablet Take 12.5 mg by mouth as needed for dizziness.    [provider]  metoprolol  succinate (TOPROL  XL) 25 MG 24 hr tablet Take 0.5 tablets (12.5 mg total) by mouth at bedtime. 04/09/21 04/09/22  Hassie Lint, PA-C  omeprazole  (PRILOSEC) 20 MG capsule Take 1 capsule (20 mg total) by mouth daily. 08/31/23   Darlis Eisenmenger, MD  potassium chloride  SA (KLOR-CON  M) 20 MEQ tablet Take 1 tablet (20 mEq total) by mouth daily. 03/30/22   Darlis Eisenmenger, MD  sertraline  (ZOLOFT ) 50 MG tablet Take  1.5 tablets (75 mg total) by mouth daily. 03/30/22   Darlis Eisenmenger, MD      Allergies    Fentanyl     Review of Systems   Review of Systems  All other systems reviewed and are negative.   Physical Exam Updated Vital Signs BP 138/84 (BP Location: Right Arm)   Pulse (!) 102   Temp 98.1 F (36.7 C) (Oral)   Resp 18   Ht 5\' 9"  (1.753 m)   Wt 60.3 kg   SpO2 100%   BMI 19.64 kg/m  Physical Exam Vitals and nursing note reviewed.  Constitutional:      General: She is not in acute distress.    Appearance: She is well-developed. She is not diaphoretic.  HENT:     Head: Normocephalic and atraumatic.  Cardiovascular:     Rate and Rhythm: Normal rate and regular rhythm.     Heart sounds: No murmur heard.    No friction rub. No gallop.  Pulmonary:     Effort: Pulmonary effort is normal. No respiratory distress.     Breath sounds: Normal breath sounds. No wheezing.  Abdominal:     General: Bowel sounds are normal. There is no distension.     Palpations: Abdomen is soft.     Tenderness: There is no abdominal tenderness.  Musculoskeletal:        General: Normal range of motion.  Cervical back: Normal range of motion and neck supple.  Skin:    General: Skin is warm and dry.  Neurological:     General: No focal deficit present.     Mental Status: She is alert and oriented to person, place, and time.     ED Results / Procedures / Treatments   Labs (all labs ordered are listed, but only abnormal results are displayed) Labs Reviewed  BASIC METABOLIC PANEL WITH GFR - Abnormal; Notable for the following components:      Result Value   Glucose, Bld 102 (*)    All other components within normal limits  CBC - Abnormal; Notable for the following components:   Hemoglobin 11.3 (*)    HCT 34.3 (*)    All other components within normal limits  PREGNANCY, URINE  TROPONIN I (HIGH SENSITIVITY)    EKG EKG Interpretation Date/Time:  Thursday Nov 24 2023 22:17:19 EDT Ventricular  Rate:  87 PR Interval:  186 QRS Duration:  82 QT Interval:  388 QTC Calculation: 466 R Axis:   75  Text Interpretation: Normal sinus rhythm ST & T wave abnormality, consider inferior ischemia Prolonged QT Abnormal ECG When compared with ECG of 19-Nov-2023 11:57, T wave inversion no longer evident in Lateral leads Confirmed by Orvilla Blander ([id number]) on 11/25/2023 12:02:32 AM  Radiology DG Chest 2 View Result Date: 11/24/2023 CLINICAL DATA:  Chest pain EXAM: CHEST - 2 VIEW COMPARISON:  11/19/2023 FINDINGS: Heart and mediastinal contours are within normal limits. Again noted are reticulonodular densities projected over the upper lung zones, similar to prior study. No effusions. No acute bony abnormality. IMPRESSION: Reticulonodular densities again noted projecting over the upper lung zones. This could reflect chronic lung disease or less likely atypical infection. Electronically Signed   By: Janeece Mechanic M.D.   On: 11/24/2023 22:47    Procedures Procedures    Medications Ordered in ED Medications - No data to display  ED Course/ Medical Decision Making/ A&P  Patient is a 38 year old female presenting with chest pain as described in the HPI.  Patient arrives here with stable vital signs and is afebrile.  Physical examination is unremarkable.  Laboratory studies obtained including CBC, metabolic panel, and troponin, all of which are normal.  Chest x-ray showing reticulonodular densities consistent with prior studies.  At this point, I feel as though patient can safely be discharged.  She has had extensive cardiac workup in the past including a normal heart cath.  I feel as though patient can safely be discharged.  She is to take ibuprofen , rest, and follow-up as needed.  Final Clinical Impression(s) / ED Diagnoses Final diagnoses:  None    Rx / DC Orders ED Discharge Orders     None         Orvilla Blander, MD 11/25/23 [phone]

## 2023-11-25 NOTE — Discharge Instructions (Signed)
 Take ibuprofen  600 mg every 6 hours as needed for pain.  Follow-up with primary doctor if symptoms persist.

## 2023-11-27 DIAGNOSIS — I10 Essential (primary) hypertension: Secondary | ICD-10-CM | POA: Diagnosis not present

## 2023-11-27 DIAGNOSIS — Z681 Body mass index (BMI) 19 or less, adult: Secondary | ICD-10-CM | POA: Diagnosis not present

## 2023-11-28 ENCOUNTER — Other Ambulatory Visit (HOSPITAL_COMMUNITY): Payer: Self-pay | Admitting: Cardiology

## 2023-11-28 ENCOUNTER — Encounter (HOSPITAL_COMMUNITY): Payer: Self-pay

## 2023-11-28 ENCOUNTER — Ambulatory Visit (HOSPITAL_COMMUNITY)
Admission: RE | Admit: 2023-11-28 | Discharge: 2023-11-28 | Disposition: A | Discharge status: Home / Self Care | Source: Ambulatory Visit | Attending: Internal Medicine | Admitting: Internal Medicine

## 2023-11-28 ENCOUNTER — Inpatient Hospital Stay (HOSPITAL_COMMUNITY)
Admission: RE | Admit: 2023-11-28 | Discharge: 2023-11-28 | Disposition: A | Discharge status: Home / Self Care | Source: Ambulatory Visit | Attending: Cardiology | Admitting: Cardiology

## 2023-11-28 ENCOUNTER — Ambulatory Visit (HOSPITAL_COMMUNITY): Payer: Self-pay | Admitting: Internal Medicine

## 2023-11-28 VITALS — BP 100/68 | HR 120 | Wt 129.4 lb

## 2023-11-28 DIAGNOSIS — Z79899 Other long term (current) drug therapy: Secondary | ICD-10-CM | POA: Insufficient documentation

## 2023-11-28 DIAGNOSIS — F411 Generalized anxiety disorder: Secondary | ICD-10-CM | POA: Insufficient documentation

## 2023-11-28 DIAGNOSIS — R55 Syncope and collapse: Secondary | ICD-10-CM | POA: Diagnosis not present

## 2023-11-28 DIAGNOSIS — F1011 Alcohol abuse, in remission: Secondary | ICD-10-CM | POA: Diagnosis not present

## 2023-11-28 DIAGNOSIS — G90A Postural orthostatic tachycardia syndrome (POTS): Secondary | ICD-10-CM | POA: Insufficient documentation

## 2023-11-28 DIAGNOSIS — F1010 Alcohol abuse, uncomplicated: Secondary | ICD-10-CM | POA: Diagnosis not present

## 2023-11-28 DIAGNOSIS — I428 Other cardiomyopathies: Secondary | ICD-10-CM | POA: Diagnosis not present

## 2023-11-28 DIAGNOSIS — I5022 Chronic systolic (congestive) heart failure: Secondary | ICD-10-CM

## 2023-11-28 DIAGNOSIS — I110 Hypertensive heart disease with heart failure: Secondary | ICD-10-CM | POA: Diagnosis not present

## 2023-11-28 DIAGNOSIS — R000 Tachycardia, unspecified: Secondary | ICD-10-CM

## 2023-11-28 DIAGNOSIS — I10 Essential (primary) hypertension: Secondary | ICD-10-CM

## 2023-11-28 DIAGNOSIS — Z8616 Personal history of COVID-19: Secondary | ICD-10-CM | POA: Insufficient documentation

## 2023-11-28 DIAGNOSIS — R002 Palpitations: Secondary | ICD-10-CM

## 2023-11-28 DIAGNOSIS — R9431 Abnormal electrocardiogram [ECG] [EKG]: Secondary | ICD-10-CM | POA: Diagnosis not present

## 2023-11-28 DIAGNOSIS — R0789 Other chest pain: Secondary | ICD-10-CM

## 2023-11-28 DIAGNOSIS — F419 Anxiety disorder, unspecified: Secondary | ICD-10-CM

## 2023-11-28 LAB — BASIC METABOLIC PANEL WITH GFR
Anion gap: 13 (ref 5–15)
BUN: 9 mg/dL (ref 6–20)
CO2: 24 mmol/L (ref 22–32)
Calcium: 9 mg/dL (ref 8.9–10.3)
Chloride: 99 mmol/L (ref 98–111)
Creatinine, Ser: 0.9 mg/dL (ref 0.44–1.00)
GFR, Estimated: >60 mL/min (ref >60)
Glucose, Bld: 101 mg/dL — ABNORMAL HIGH (ref 70–99)
Potassium: 3.4 mmol/L — ABNORMAL LOW (ref 3.5–5.1)
Sodium: 136 mmol/L (ref 135–145)

## 2023-11-28 LAB — BRAIN NATRIURETIC PEPTIDE: B Natriuretic Peptide: 4.7 pg/mL (ref 0.0–100.0)

## 2023-11-28 MED ORDER — IVABRADINE HCL 5 MG PO TABS: 2.5000 mg | ORAL_TABLET | Freq: Two times a day (BID) | ORAL | 3 refills | Status: DC

## 2023-11-28 NOTE — Progress Notes (Signed)
 Advanced Heart Failure Clinic Note   PCP: [facility] HF Cardiologist: Dr. Mitzie Anda    HPI: Tina Fletcher is a 38 y.o. female with past history of HTN, chronic systolic heart failure and hx of ETOH abuse.  Admitted 11/20 with new acute systolic heart failure. Echo showed new onset systolic HF with EF 15% and decreased RV systolic function. She had a history of increased alcohol use over the several months prior with 7-8 airplane bottles of liquor 3-4 days per week. She was diuresed with IV lasix . R/LHC showed no significant coronary artery disease and RHC was within normal limits. Her cardiomyopathy is possibly secondary to recent increased alcohol use or uncontrolled HTN. Viral myocarditis was felt to be a possible etiology, however cMRI showed no myocardial LGE, so no definitive evidence for prior MI, infiltrative disease, or myocarditis.  Also of note, she has no family history of cardiomyopathy, negative HIV, and TSH was normal.   Echo 3/21 EF 30-35%, global hypokinesis, mild RV dilation with normal function, mild MR.   On 06/06/20, she had a CBD gummy with a high dosage of CBD in it.  Shortly afterwards, she became lightheaded and dizzy and actually passed out.  She had been out of her meds for several days and her BP was elevated when she went to the ER.  HS-TnI and D dimer were normal. She was discharged home.   Echo 1/22 EF up to 40-45%. She wore a Zio patch in 1/22 with no significant events but only kept it on for about 4 days.   In 2/22, she was admitted with hypertensive urgency with syncope.  BP 218/110.  She had not been taking her evening BP med doses.  BP was controlled and she was discharged.  Later in 2/22, she went to the ER with chest pain.  Troponin was negative but BP was high.  She was sent home.   Evaluated in clinic, 08/22/20 and was hypertensive. Also very anxious. EKG showed NSR w/ mildly prolonged QTc 474 ms. Labs reviewed and showed  hypokalemia. KCl 20 daily was added. Amlodipine  5 mg daily also added. I also started sertraline  25 mg daily for marked anxiety.   Had return clinic f/u on 3/1. Just prior to that had been seen in the ED on 2/27 for continued anxiety and palpitations. She reported the ED physician gave her something for possible withdrawals, which had helped significantly. She had quit drinking 8 days prior.   She was evaluated 11 times at different EDs for chest pain.  No cardiac findings. Felt to be due to anxiety.   Evaluated in ED 11/19/20, 12/02/20, and 12/15/20 for CP. Cardiac work up unremarkable, EKGs reassuring. Felt to be due to anxiety and possibly GERD.  She had 2 more ER visits with chest pain thought to be related to anxiety in 12/22.   Echo 2/23 EF up to 50-55%, normal diastolic function, normal strain pattern, normal RV.   Today she returns for AHF post hospital follow up, we have not seen her in clinic since 9/23. Since then she has been to the ED numerous times for opiate withdrawals and chest pain. Work up consistently negative. Overall feeling "not good". Can't fully tell me what's bothering her she just knows she doesn't feel good. Denies edema, or PND/Orthopnea. Feels intt palpitations. CP with exertion. Dizziness recently because they started her on lisinopril  over the weekend and she also started taking metoprolol  again the last 2 nights. Denies SOB. Appetite poor.  No fever or chills. Reports compliance with medications but not fully clear on what or when she takes her medications. Reports anxiety. Denies ETOH, tobacco or drug use. Has not taken subxone in 3 days.   ECG: ST, RAE 115 bpm (Personally reviewed)    PMH: 1. HTN - Renal artery dopplers (3/21): No evidence for renal artery stenosis.  2. ETOH abuse: Has quit.  3. Chronic systolic CHF: Nonischemic cardiomyopathy.  - LHC/RHC (11/20): No significant CAD; mean RA 1, PA 24/8, mean PCWP 5, CI 3.54.  - Echo (11/20): EF 15%, diffuse  hypokinesis, mildly decreased RV systolic function.  - CMRI (11/20): LV mildly dilated with EF 16%, normal RV size with EF 28%, no myocardial LGE.  - Echo (3/21): EF 30-35%, global hypokinesis, mild RV dilation with normal function, mild MR.  - Echo (1/22): EF 40-45%, normal RV.  - Echo (2/23): EF 50-55%, normal diastolic function, normal strain pattern, normal RV.  4. COVID-19 infection 9/21 5. Prolonged QT interval - Zio patch 3/22: No significant abnormalities.   Review of Systems: All systems reviewed and negative except as per HPI.   Current Outpatient Medications  Medication Sig Dispense Refill   Buprenorphine HCl-Naloxone HCl (SUBOXONE) 4-1 MG FILM Place 4 Film under the tongue in the morning, at noon, and at bedtime.     etonogestrel  (NEXPLANON ) 68 MG IMPL implant Inject 1 each into the skin continuous.     ibuprofen  (ADVIL ) 800 MG tablet Take 1 tablet (800 mg total) by mouth every 8 (eight) hours as needed for moderate pain. 21 tablet 0   Iron , Ferrous Sulfate , 325 (65 Fe) MG TABS Take 325 mg by mouth daily. 60 tablet 3   ivabradine  (CORLANOR ) 5 MG TABS tablet Take 0.5 tablets (2.5 mg total) by mouth 2 (two) times daily with a meal. 45 tablet 3   meclizine (ANTIVERT) 12.5 MG tablet Take 12.5 mg by mouth as needed for dizziness.     omeprazole  (PRILOSEC) 20 MG capsule Take 1 capsule (20 mg total) by mouth daily. 30 capsule 0   potassium chloride  SA (KLOR-CON  M) 20 MEQ tablet Take 1 tablet (20 mEq total) by mouth daily. 90 tablet 3   metoprolol  succinate (TOPROL  XL) 25 MG 24 hr tablet Take 0.5 tablets (12.5 mg total) by mouth at bedtime. (Patient not taking: Reported on 11/28/2023) 45 tablet 3   sertraline  (ZOLOFT ) 50 MG tablet Take 1.5 tablets (75 mg total) by mouth daily. (Patient not taking: Reported on 11/28/2023) 45 tablet 5   No current facility-administered medications for this encounter.   Allergies  Allergen Reactions   Fentanyl  Other (See Comments)    Headaches   Social  History   Socioeconomic History   Marital status: Single    Spouse name: Not on file   Number of children: Not on file   Years of education: Not on file   Highest education level: Not on file  Occupational History   Not on file  Tobacco Use   Smoking status: Never   Smokeless tobacco: Never  Vaping Use   Vaping status: Never Used  Substance and Sexual Activity   Alcohol use: Not Currently   Drug use: Not Currently    Comment: reports was using 80mg  of Oxycodone  up until 03/11/23, but has now stopped completely   Sexual activity: Yes    Birth control/protection: Implant  Other Topics Concern   Not on file  Social History Narrative   Not on file   Social Drivers of  Health   Financial Resource Strain: Low Risk  (08/26/2020)   Overall Financial Resource Strain (CARDIA)    Difficulty of Paying Living Expenses: Not hard at all  Food Insecurity: No Food Insecurity (08/26/2020)   Hunger Vital Sign    Worried About Running Out of Food in the Last Year: Never true    Ran Out of Food in the Last Year: Never true  Transportation Needs: No Transportation Needs (08/26/2020)   PRAPARE - Administrator, Civil Service (Medical): No    Lack of Transportation (Non-Medical): No  Physical Activity: Inactive (08/26/2020)   Exercise Vital Sign    Days of Exercise per Week: 0 days    Minutes of Exercise per Session: 0 min  Stress: No Stress Concern Present (09/24/2019)   [employer] of Occupational Health - Occupational Stress Questionnaire    Feeling of Stress : Not at all  Social Connections: Moderately Integrated (09/24/2019)   Social Connection and Isolation Panel [NHANES]    Frequency of Communication with Friends and Family: More than three times a week    Frequency of Social Gatherings with Friends and Family: More than three times a week    Attends Religious Services: More than 4 times per year    Active Member of [employer] or Organizations: No    Attends Banker  Meetings: Never    Marital Status: Living with partner  Intimate Partner Violence: Not At Risk (09/24/2019)   Humiliation, Afraid, Rape, and Kick questionnaire    Fear of Current or Ex-Partner: No    Emotionally Abused: No    Physically Abused: No    Sexually Abused: No   Family History  Problem Relation Age of Onset   Diabetes Maternal Grandmother    Cancer Paternal Grandfather        prancreatic   COPD Maternal Grandfather    Cancer Maternal Grandfather        prostate   Asthma Maternal Grandfather    Asthma Son    Bronchitis Son    BP 100/68   Pulse (!) 120   Wt 58.7 kg (129 lb 6.4 oz)   SpO2 97%   BMI 19.11 kg/m   Wt Readings from Last 3 Encounters:  11/28/23 58.7 kg (129 lb 6.4 oz)  11/24/23 60.3 kg (133 lb)  11/19/23 63.5 kg (140 lb)   PHYSICAL EXAM: General:  thin/ frail appearing.  No respiratory difficulty Neck: supple. JVD flat.  Cor: PMI nondisplaced. Regular rate & rhythm. No rubs, gallops or murmurs. Lungs: clear Extremities: no cyanosis, clubbing, rash, edema  Neuro: alert & oriented x 3. Moves all 4 extremities w/o difficulty. Affect flat.   ASSESSMENT & PLAN: 1. Chronic systolic CHF: Nonischemic cardiomyopathy, probably due to ETOH.  No FH cardiomyopathy, negative HIV and normal TSH, no recent pregnancy.  She had been drinking heavily for many months prior to diagnosis, ETOH may be the main cause of her cardiomyopathy.  LHC 04/2019 w/ no CAD, filling pressures optimized and preserved cardiac output. Cardiac MRI showed no myocardial LGE, so no definitive evidence for prior MI, infiltrative disease, or myocarditis. Echo in 3/21 showed EF 30-35%, echo in 1/22 with EF up to 40-45%, normal RV.  Echo 2/23: EF up to 50-55%.  Previously improved as she was staying off ETOH.  - Stop toprol  XL as she feels like it makes her extra dizzy.  - Restart ivabradine  2.5 mg BID.  - Stop lisinopril  with ongoing dizziness.  - No BP room  for Entresto , spironolactone .  - update  echo   2. HTN: Well-controlled. Labs today.  - No evidence for fibromuscular dysplasia on renal artery dopplers.   3. ETOH abuse: She reports she has quit ETOH since 08/16/20.  - Reports no drug use but frequent ED visits for opiate withdrawals.  - Congratulated on abstinence.  4. 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 QTc 453 msec on ECG today   5. Anxiety: Marked generalized anxiety.   - Asking about anxiety meds today. Encouraged her to f/u with PCP.   6. Palpitations, Syncope/Orthostatic Hypotension, tachycardia: ? POTS in this young female. Saw Dr Rodolfo Clan and he felt this was anxiety.  Zio patch in 3/22 with no arrhythmias. Continues with frequent palpitations.  - Restarting ivabradine  as above - Place 7 day zio.  - EKG today in ST - BP soft 2/2 toprol  XL + lisinopril . Stop lisinopril  as above.  - Check TSH  7. Atypical CP - Has recurrent ED visits for atypical chest pain - LHC 11/20 with no CAD - EKG with ST - Suspected 2/2 anxiety, needs to reach out to PCP to restart meds - Update echo and place zio today - May need repeat imaging vs LHC.   F/u in 2 months with Dr. Mitzie Anda. Update echo and zio in the interim.   Sheryl Donna, NP 11/28/23

## 2023-11-28 NOTE — Patient Instructions (Signed)
 Start ivabradine  (Corlanor ) 2.5 mg daily - Rx sent. Stop Lisinopril . Labs today - will call you if abnormal. Echocardiogram ordered - see below. Return to see Dr. Mitzie Anda in 2 months. CALL [phone] IN JULY TO SCHEDULE THIS APPOINTMENT. Please call us  at [phone] if any questions or concerns prior to your next visit.    Your provider has recommended that  you wear a Zio Patch for 7 days.  This monitor will record your heart rhythm for our review.  IF you have any symptoms while wearing the monitor please press the button.  If you have any issues with the patch or you notice a red or orange light on it please call the company at [phone].  Once you remove the patch please mail it back to the company as soon as possible so we can get the results.  Zio patch placed onto patient.  All instructions and information reviewed with patient, they verbalize understanding with no questions.

## 2023-12-05 ENCOUNTER — Other Ambulatory Visit: Payer: Self-pay

## 2023-12-05 ENCOUNTER — Emergency Department (HOSPITAL_COMMUNITY)
Admission: EM | Admit: 2023-12-05 | Discharge: 2023-12-05 | Disposition: A | Discharge status: Home / Self Care | Attending: Emergency Medicine | Admitting: Emergency Medicine

## 2023-12-05 ENCOUNTER — Encounter (HOSPITAL_COMMUNITY): Payer: Self-pay

## 2023-12-05 ENCOUNTER — Emergency Department (HOSPITAL_COMMUNITY)

## 2023-12-05 DIAGNOSIS — R079 Chest pain, unspecified: Secondary | ICD-10-CM | POA: Insufficient documentation

## 2023-12-05 DIAGNOSIS — Z79899 Other long term (current) drug therapy: Secondary | ICD-10-CM | POA: Insufficient documentation

## 2023-12-05 DIAGNOSIS — I110 Hypertensive heart disease with heart failure: Secondary | ICD-10-CM | POA: Diagnosis not present

## 2023-12-05 DIAGNOSIS — R0602 Shortness of breath: Secondary | ICD-10-CM | POA: Diagnosis not present

## 2023-12-05 DIAGNOSIS — R0789 Other chest pain: Secondary | ICD-10-CM | POA: Diagnosis not present

## 2023-12-05 DIAGNOSIS — R918 Other nonspecific abnormal finding of lung field: Secondary | ICD-10-CM | POA: Diagnosis not present

## 2023-12-05 DIAGNOSIS — I5022 Chronic systolic (congestive) heart failure: Secondary | ICD-10-CM | POA: Insufficient documentation

## 2023-12-05 LAB — BASIC METABOLIC PANEL WITH GFR
Anion gap: 10 (ref 5–15)
BUN: 9 mg/dL (ref 6–20)
CO2: 24 mmol/L (ref 22–32)
Calcium: 9.2 mg/dL (ref 8.9–10.3)
Chloride: 103 mmol/L (ref 98–111)
Creatinine, Ser: 0.81 mg/dL (ref 0.44–1.00)
GFR, Estimated: >60 mL/min (ref >60)
Glucose, Bld: 111 mg/dL — ABNORMAL HIGH (ref 70–99)
Potassium: 3.4 mmol/L — ABNORMAL LOW (ref 3.5–5.1)
Sodium: 137 mmol/L (ref 135–145)

## 2023-12-05 LAB — CBC WITH DIFFERENTIAL/PLATELET
Abs Immature Granulocytes: 0.01 10*3/uL (ref 0.00–0.07)
Basophils Absolute: 0 10*3/uL (ref 0.0–0.1)
Basophils Relative: 1 %
Eosinophils Absolute: 0.1 10*3/uL (ref 0.0–0.5)
Eosinophils Relative: 1 %
HCT: 35.8 % — ABNORMAL LOW (ref 36.0–46.0)
Hemoglobin: 11.3 g/dL — ABNORMAL LOW (ref 12.0–15.0)
Immature Granulocytes: 0 %
Lymphocytes Relative: 25 %
Lymphs Abs: 0.9 10*3/uL (ref 0.7–4.0)
MCH: 26.4 pg (ref 26.0–34.0)
MCHC: 31.6 g/dL (ref 30.0–36.0)
MCV: 83.6 fL (ref 80.0–100.0)
Monocytes Absolute: 0.3 10*3/uL (ref 0.1–1.0)
Monocytes Relative: 8 %
Neutro Abs: 2.3 10*3/uL (ref 1.7–7.7)
Neutrophils Relative %: 65 %
Platelets: 198 10*3/uL (ref 150–400)
RBC: 4.28 MIL/uL (ref 3.87–5.11)
RDW: 13.1 % (ref 11.5–15.5)
WBC: 3.6 10*3/uL — ABNORMAL LOW (ref 4.0–10.5)
nRBC: 0 % (ref 0.0–0.2)

## 2023-12-05 LAB — D-DIMER, QUANTITATIVE: D-Dimer, Quant: 0.35 ug{FEU}/mL (ref 0.00–0.50)

## 2023-12-05 LAB — TROPONIN I (HIGH SENSITIVITY)
Troponin I (High Sensitivity): 2 ng/L (ref <18)
Troponin I (High Sensitivity): 2 ng/L (ref <18)

## 2023-12-05 LAB — BRAIN NATRIURETIC PEPTIDE: B Natriuretic Peptide: 23 pg/mL (ref 0.0–100.0)

## 2023-12-05 NOTE — ED Provider Notes (Signed)
 [facility] AT [facility] Provider Note   CSN: [id number] Arrival date & time: 12/05/23  1359     History  Chief Complaint  Patient presents with   Chest Pain    Tina Fletcher is a 38 y.o. female.   Chest Pain Associated symptoms: no abdominal pain, no cough, no fever, no headache, no nausea, no palpitations, no shortness of breath, no vomiting and no weakness        Tina Fletcher is a 38 y.o. female with medical history of chronic systolic CHF, anxiety, prolonged QT and hypokalemia, hypertension.  She presents to the Emergency Department complaining of recurrent sharp stabbing chest pain that began around 10 AM this morning.  She states she woke with sharp knifelike pains to the center of her chest that last several minutes then resolved.  She states she has had pain similar to this before.  Pain is nonradiating.  She does endorse some shortness of breath mostly with exertion.  She has been diagnosed with CHF for some time.  She was initially on metoprolol  but saw her cardiologist last week and was switched to Corlanor .  She is also recently worn a Zio patch for 7 days removed this today.  She denies any peripheral edema, cough, recent illness fever or chills.  She has an appointment later this week with her cardiologist and for an echocardiogram.    Home Medications Prior to Admission medications   Medication Sig Start Date End Date Taking? Authorizing Provider  Buprenorphine HCl-Naloxone HCl (SUBOXONE) 4-1 MG FILM Place 4 Film under the tongue in the morning, at noon, and at bedtime.    [provider]  busPIRone (BUSPAR) 10 MG tablet Take 10 mg by mouth 2 (two) times daily. 11/30/23   [provider]  etonogestrel  (NEXPLANON ) 68 MG IMPL implant Inject 1 each into the skin continuous.    [provider]  ibuprofen  (ADVIL ) 800 MG tablet Take 1 tablet (800 mg total) by mouth every 8 (eight) hours as needed for  moderate pain. 03/11/23   Cheyenne Cotta, MD  Iron , Ferrous Sulfate , 325 (65 Fe) MG TABS Take 325 mg by mouth daily. 04/09/21   Hassie Lint, PA-C  ivabradine  (CORLANOR ) 5 MG TABS tablet Take 0.5 tablets (2.5 mg total) by mouth 2 (two) times daily with a meal. 11/28/23   Sheryl Donna, NP  lisinopril -hydrochlorothiazide  (ZESTORETIC ) 20-12.5 MG tablet Take 1 tablet by mouth daily. 11/19/23   [provider]  meclizine (ANTIVERT) 12.5 MG tablet Take 12.5 mg by mouth as needed for dizziness.    [provider]  metoprolol  succinate (TOPROL  XL) 25 MG 24 hr tablet Take 0.5 tablets (12.5 mg total) by mouth at bedtime. Patient not taking: Reported on 11/28/2023 04/09/21 04/09/22  Hassie Lint, PA-C  omeprazole  (PRILOSEC) 20 MG capsule Take 1 capsule (20 mg total) by mouth daily. 08/31/23   Darlis Eisenmenger, MD  potassium chloride  SA (KLOR-CON  M) 20 MEQ tablet Take 1 tablet (20 mEq total) by mouth daily. 03/30/22   Darlis Eisenmenger, MD  sertraline  (ZOLOFT ) 50 MG tablet Take 1.5 tablets (75 mg total) by mouth daily. Patient not taking: Reported on 11/28/2023 03/30/22   Darlis Eisenmenger, MD      Allergies    Fentanyl     Review of Systems   Review of Systems  Constitutional:  Negative for chills and fever.  Respiratory:  Negative for cough and shortness of breath.   Cardiovascular:  Positive for chest pain. Negative for palpitations and leg swelling.  Gastrointestinal:  Negative for abdominal pain, nausea and vomiting.  Genitourinary:  Negative for difficulty urinating, dysuria and flank pain.  Musculoskeletal:  Negative for neck pain.  Neurological:  Negative for weakness and headaches.    Physical Exam Updated Vital Signs BP 112/81   Pulse 89   Temp 98.2 F (36.8 C) (Oral)   Resp 18   Ht 5\' 9"  (1.753 m)   Wt 58.6 kg   SpO2 100%   BMI 19.08 kg/m  Physical Exam Vitals and nursing note reviewed.  Constitutional:      General: She is not in acute distress.    Appearance:  Normal appearance. She is not toxic-appearing.  Eyes:     Conjunctiva/sclera: Conjunctivae normal.     Pupils: Pupils are equal, round, and reactive to light.  Cardiovascular:     Rate and Rhythm: Normal rate and regular rhythm.     Pulses: Normal pulses.  Pulmonary:     Effort: Pulmonary effort is normal. No respiratory distress.  Chest:     Chest wall: No tenderness.  Abdominal:     Palpations: Abdomen is soft.     Tenderness: There is no abdominal tenderness.  Musculoskeletal:        General: Normal range of motion.     Right lower leg: No edema.     Left lower leg: No edema.  Skin:    General: Skin is warm.     Capillary Refill: Capillary refill takes less than 2 seconds.  Neurological:     General: No focal deficit present.     Mental Status: She is alert.     Sensory: No sensory deficit.     Motor: No weakness.     ED Results / Procedures / Treatments   Labs (all labs ordered are listed, but only abnormal results are displayed) Labs Reviewed  BASIC METABOLIC PANEL WITH GFR - Abnormal; Notable for the following components:      Result Value   Potassium 3.4 (*)    Glucose, Bld 111 (*)    All other components within normal limits  CBC WITH DIFFERENTIAL/PLATELET - Abnormal; Notable for the following components:   WBC 3.6 (*)    Hemoglobin 11.3 (*)    HCT 35.8 (*)    All other components within normal limits  BRAIN NATRIURETIC PEPTIDE  D-DIMER, QUANTITATIVE  TROPONIN I (HIGH SENSITIVITY)  TROPONIN I (HIGH SENSITIVITY)    EKG EKG Interpretation Date/Time:  Monday December 05 2023 14:11:23 EDT Ventricular Rate:  92 PR Interval:  154 QRS Duration:  80 QT Interval:  352 QTC Calculation: 435 R Axis:   66  Text Interpretation: Normal sinus rhythm Right atrial enlargement ST & T wave abnormality, consider inferior ischemia ST & T wave abnormality, consider anterolateral ischemia Abnormal ECG When compared with ECG of 28-Nov-2023 12:10, since last tracing no  significant change Confirmed by Early Glisson ([id number]) on 12/05/2023 6:04:15 PM  Radiology DG Chest 2 View Result Date: 12/05/2023 CLINICAL DATA:  Chest pain.  Recurrent pain. EXAM: CHEST - 2 VIEW COMPARISON:  Most recent exam 11/24/2023 CT 12/23/2022 reviewed FINDINGS: The heart is normal in size.The cardiomediastinal contours are normal. Again seen reticulonodular densities in the upper lung zones, right greater than left. No change from prior exam. Pulmonary vasculature is normal. No confluent airspace disease, pleural effusion, or pneumothorax. No acute osseous abnormalities are seen. IMPRESSION: Unchanged reticulonodular densities in the upper lung zones, right greater  than left. This may represent chronic lung disease or atypical infection. Given recurrent symptoms, consider CT follow-up. Electronically Signed   By: Chadwick Colonel M.D.   On: 12/05/2023 15:31    Procedures Procedures    Medications Ordered in ED Medications - No data to display  ED Course/ Medical Decision Making/ A&P                                 Medical Decision Making Patient here with recurrent central chest pain.  Is followed by cardiology in [facility] for CHF.  Recently switched from metoprolol  to Corlanor .  Here with sharp stabbing central chest pain that began around 10 AM this morning.  She endorses brief episodes lasting 1 to 2 minutes of stabbing-like pain.  She has follow-up with cardiology later this week as well as a echocardiogram.  On review of medical records, patient had echocardiogram in 2023 that showed EF of 50 to 55%.  Differential would include but not limited to ACS, palpitations, anxiety, PE, pneumonia also considered but felt less likely.  Vitals reassuring.  Doubt dissection.   Amount and/or Complexity of Data Reviewed Labs: ordered.    Details: Labs, no evidence of significant leukocytosis, chemistries without derangement, BNP is reassuring delta troponin also reassuring and  unchanged D-dimer unremarkable Radiology: ordered.    Details: Chest x-ray shows unchanged right reticulonodular densities in the upper lung zones right greater than left.  May represent chronic lung disease or an atypical infection.   ECG/medicine tests: ordered.    Details: EKG shows normal sinus rhythm right atrial enlargement  Discussion of management or test interpretation with external provider(s): Vital signs are reassuring.  Workup today also reassuring.  She has close cardiac follow-up for later this week.  She does have cardiac history, I suspect there may be a component of anxiety.  She states she was recently started on buspirone 1 week ago.  Reassured patient and explained that buspirone may take more than 1 week to improve her symptoms. She will keep her upcoming appointment for her echocardiogram and with her cardiologist for later this week.  Return precautions were given.  She appears appropriate for discharge home at this time.           Final Clinical Impression(s) / ED Diagnoses Final diagnoses:  Nonspecific chest pain    Rx / DC Orders ED Discharge Orders     None         Catherne Clubs, PA-C 12/05/23 1817    Early Glisson, MD 12/06/23 1126

## 2023-12-05 NOTE — ED Triage Notes (Signed)
 Pt arrived via POV c/o recurrent chest pain that began again this morning around 1000. Pt reports pain is intermittent and sharp.

## 2023-12-05 NOTE — Discharge Instructions (Signed)
 Continue taking your medications as directed.  Please keep your appointment for later this week for your echocardiogram and to see your cardiologist.  Return to the emergency department if you develop any new or worsening symptoms.

## 2023-12-09 ENCOUNTER — Ambulatory Visit (HOSPITAL_COMMUNITY)
Admission: RE | Admit: 2023-12-09 | Discharge: 2023-12-09 | Disposition: A | Discharge status: Home / Self Care | Source: Ambulatory Visit | Attending: Internal Medicine | Admitting: Internal Medicine

## 2023-12-09 DIAGNOSIS — I5022 Chronic systolic (congestive) heart failure: Secondary | ICD-10-CM | POA: Insufficient documentation

## 2023-12-09 DIAGNOSIS — I110 Hypertensive heart disease with heart failure: Secondary | ICD-10-CM | POA: Insufficient documentation

## 2023-12-09 DIAGNOSIS — R9431 Abnormal electrocardiogram [ECG] [EKG]: Secondary | ICD-10-CM | POA: Diagnosis not present

## 2023-12-09 DIAGNOSIS — R55 Syncope and collapse: Secondary | ICD-10-CM | POA: Diagnosis not present

## 2023-12-09 LAB — ECHOCARDIOGRAM COMPLETE
Area-P 1/2: 3.72 cm2
Calc EF: 62.5 %
S' Lateral: 3 cm
Single Plane A2C EF: 64.6 %
Single Plane A4C EF: 62.1 %

## 2023-12-09 NOTE — Progress Notes (Signed)
  Echocardiogram 2D Echocardiogram has been performed.  Tina Fletcher 12/09/2023, 1:45 PM

## 2023-12-13 DIAGNOSIS — R000 Tachycardia, unspecified: Secondary | ICD-10-CM | POA: Diagnosis not present

## 2023-12-17 ENCOUNTER — Emergency Department (HOSPITAL_COMMUNITY)

## 2023-12-17 ENCOUNTER — Encounter (HOSPITAL_COMMUNITY): Payer: Self-pay

## 2023-12-17 ENCOUNTER — Emergency Department (HOSPITAL_COMMUNITY)
Admission: EM | Admit: 2023-12-17 | Discharge: 2023-12-17 | Disposition: A | Discharge status: Home / Self Care | Attending: Emergency Medicine | Admitting: Emergency Medicine

## 2023-12-17 ENCOUNTER — Other Ambulatory Visit: Payer: Self-pay

## 2023-12-17 DIAGNOSIS — E876 Hypokalemia: Secondary | ICD-10-CM | POA: Insufficient documentation

## 2023-12-17 DIAGNOSIS — Z79899 Other long term (current) drug therapy: Secondary | ICD-10-CM | POA: Insufficient documentation

## 2023-12-17 DIAGNOSIS — D649 Anemia, unspecified: Secondary | ICD-10-CM | POA: Insufficient documentation

## 2023-12-17 DIAGNOSIS — I509 Heart failure, unspecified: Secondary | ICD-10-CM | POA: Insufficient documentation

## 2023-12-17 DIAGNOSIS — I110 Hypertensive heart disease with heart failure: Secondary | ICD-10-CM | POA: Diagnosis not present

## 2023-12-17 DIAGNOSIS — R110 Nausea: Secondary | ICD-10-CM | POA: Diagnosis not present

## 2023-12-17 LAB — BASIC METABOLIC PANEL WITH GFR
Anion gap: 9 (ref 5–15)
BUN: 9 mg/dL (ref 6–20)
CO2: 25 mmol/L (ref 22–32)
Calcium: 8.9 mg/dL (ref 8.9–10.3)
Chloride: 107 mmol/L (ref 98–111)
Creatinine, Ser: 1.04 mg/dL — ABNORMAL HIGH (ref 0.44–1.00)
GFR, Estimated: >60 mL/min (ref >60)
Glucose, Bld: 94 mg/dL (ref 70–99)
Potassium: 3 mmol/L — ABNORMAL LOW (ref 3.5–5.1)
Sodium: 141 mmol/L (ref 135–145)

## 2023-12-17 LAB — CBC
HCT: 32.6 % — ABNORMAL LOW (ref 36.0–46.0)
Hemoglobin: 10.6 g/dL — ABNORMAL LOW (ref 12.0–15.0)
MCH: 27.5 pg (ref 26.0–34.0)
MCHC: 32.5 g/dL (ref 30.0–36.0)
MCV: 84.7 fL (ref 80.0–100.0)
Platelets: 210 10*3/uL (ref 150–400)
RBC: 3.85 MIL/uL — ABNORMAL LOW (ref 3.87–5.11)
RDW: 13.3 % (ref 11.5–15.5)
WBC: 4 10*3/uL (ref 4.0–10.5)
nRBC: 0 % (ref 0.0–0.2)

## 2023-12-17 LAB — TROPONIN I (HIGH SENSITIVITY)
Troponin I (High Sensitivity): 3 ng/L (ref <18)
Troponin I (High Sensitivity): 3 ng/L (ref <18)

## 2023-12-17 LAB — MAGNESIUM: Magnesium: 1.9 mg/dL (ref 1.7–2.4)

## 2023-12-17 MED ORDER — ONDANSETRON 4 MG PO TBDP: 4.0000 mg | ORAL_TABLET | Freq: Three times a day (TID) | ORAL | 0 refills | Status: AC | PRN

## 2023-12-17 MED ORDER — POTASSIUM CHLORIDE CRYS ER 20 MEQ PO TBCR
40.0000 meq | EXTENDED_RELEASE_TABLET | Freq: Once | ORAL | Status: AC
  Administered 2023-12-17: 40 meq via ORAL
  Filled 2023-12-17: qty 2

## 2023-12-17 MED ORDER — SODIUM CHLORIDE 0.9 % IV BOLUS
1000.0000 mL | Freq: Once | INTRAVENOUS | Status: AC
  Administered 2023-12-17: 1000 mL via INTRAVENOUS

## 2023-12-17 MED ORDER — ONDANSETRON HCL 4 MG/2ML IJ SOLN
4.0000 mg | Freq: Once | INTRAMUSCULAR | Status: AC
  Administered 2023-12-17: 4 mg via INTRAVENOUS
  Filled 2023-12-17: qty 2

## 2023-12-17 NOTE — ED Provider Notes (Signed)
 [place] EMERGENCY DEPARTMENT AT [facility] Provider Note   CSN: [id number] Arrival date & time: 12/17/23  9843     Patient presents with: Nausea   Tina Fletcher is a 38 y.o. female.   The history is provided by the patient.  She has history of hypertension, heart failure and complains of nausea which started this morning and is continued through the day.  She has not vomited.  She denies fever or chills.  She denies abdominal pain.  However, she is concerned that her heart rate has been in the 50s and usually it is in the 80s.   Prior to Admission medications   Medication Sig Start Date End Date Taking? Authorizing Provider  Buprenorphine HCl-Naloxone HCl (SUBOXONE) 4-1 MG FILM Place 4 Film under the tongue in the morning, at noon, and at bedtime.    [provider]  busPIRone (BUSPAR) 10 MG tablet Take 10 mg by mouth 2 (two) times daily. 11/30/23   [provider]  etonogestrel  (NEXPLANON ) 68 MG IMPL implant Inject 1 each into the skin continuous.    [provider]  ibuprofen  (ADVIL ) 800 MG tablet Take 1 tablet (800 mg total) by mouth every 8 (eight) hours as needed for moderate pain. 03/11/23   Suzette Pac, MD  Iron , Ferrous Sulfate , 325 (65 Fe) MG TABS Take 325 mg by mouth daily. 04/09/21   Danton Jon HERO, PA-C  ivabradine  (CORLANOR ) 5 MG TABS tablet Take 0.5 tablets (2.5 mg total) by mouth 2 (two) times daily with a meal. 11/28/23   Hayes Beckey CROME, NP  lisinopril -hydrochlorothiazide  (ZESTORETIC ) 20-12.5 MG tablet Take 1 tablet by mouth daily. 11/19/23   [provider]  meclizine (ANTIVERT) 12.5 MG tablet Take 12.5 mg by mouth as needed for dizziness.    [provider]  metoprolol  succinate (TOPROL  XL) 25 MG 24 hr tablet Take 0.5 tablets (12.5 mg total) by mouth at bedtime. Patient not taking: Reported on 11/28/2023 04/09/21 04/09/22  Danton Jon HERO, PA-C  omeprazole  (PRILOSEC) 20 MG capsule Take 1 capsule (20 mg  total) by mouth daily. 08/31/23   Rolan Ezra RAMAN, MD  potassium chloride  SA (KLOR-CON  M) 20 MEQ tablet Take 1 tablet (20 mEq total) by mouth daily. 03/30/22   Rolan Ezra RAMAN, MD  sertraline  (ZOLOFT ) 50 MG tablet Take 1.5 tablets (75 mg total) by mouth daily. Patient not taking: Reported on 11/28/2023 03/30/22   Rolan Ezra RAMAN, MD    Allergies: Fentanyl     Review of Systems  All other systems reviewed and are negative.   Updated Vital Signs BP (!) 156/95   Pulse 67   Temp 98.4 F (36.9 C) (Oral)   Resp 13   Ht 5' 9 (1.753 m)   Wt 61.2 kg   SpO2 98%   BMI 19.94 kg/m   Physical Exam Vitals and nursing note reviewed.   38 year old female, resting comfortably and in no acute distress. Vital signs are significant for elevated blood pressure. Oxygen saturation is 98%, which is normal. Head is normocephalic and atraumatic. PERRLA, EOMI.  Lungs are clear without rales, wheezes, or rhonchi. Heart has regular rate and rhythm without murmur. Abdomen is soft, flat, nontender. Skin is warm and dry without rash. Neurologic: Awake and alert, moves all extremities equally.  (all labs ordered are listed, but only abnormal results are displayed) Labs Reviewed  BASIC METABOLIC PANEL WITH GFR - Abnormal; Notable for the following components:      Result Value  Potassium 3.0 (*)    Creatinine, Ser 1.04 (*)    All other components within normal limits  CBC - Abnormal; Notable for the following components:   RBC 3.85 (*)    Hemoglobin 10.6 (*)    HCT 32.6 (*)    All other components within normal limits  POC URINE PREG, ED  TROPONIN I (HIGH SENSITIVITY)  TROPONIN I (HIGH SENSITIVITY)    EKG: None  Radiology: DG Chest 2 View Result Date: 12/17/2023 CLINICAL DATA:  CHF, nausea EXAM: CHEST - 2 VIEW COMPARISON:  12/05/2023 FINDINGS: Frontal and lateral views of the chest demonstrate an unremarkable cardiac silhouette. No acute airspace disease, effusion, or pneumothorax. No acute bony  abnormalities. IMPRESSION: 1. No acute intrathoracic process. Electronically Signed   By: Ozell Daring M.D.   On: 12/17/2023 03:26     Procedures   Medications Ordered in the ED - No data to display                                  Medical Decision Making Amount and/or Complexity of Data Reviewed Labs: ordered. Radiology: ordered.  Risk Prescription drug management.   Nausea which is most likely viral gastritis.  No evidence of serious pathology such as bowel obstruction.  I have reviewed her laboratory tests, my interpretation is hypokalemia, stable anemia.  I have ordered IV fluids, ondansetron  for nausea, oral potassium.  I have ordered a magnesium  level.  Of note, patient is taking potassium chloride  20 mill equivalents daily but is on a diuretic.  She will need to be on a higher dose of potassium.  She feels much better after above-noted treatment.  Magnesium  level is normal.  I have discussed with her the need for increased potassium supplementation in light of her taking diuretic.  She actually tells me that she is no longer taking the diuretic.  Reason for significant hypokalemia in the face of potassium supplementation and no diuretic use will need to be evaluated by her primary care provider or possibly by a nephrology referral.  In the meantime, I have advised her to increase her potassium dose at home and I am discharging her with a prescription for ondansetron  oral dissolving tablet.     Final diagnoses:  Hypokalemia  Normochromic normocytic anemia    ED Discharge Orders          Ordered    ondansetron  (ZOFRAN -ODT) 4 MG disintegrating tablet  Every 8 hours PRN        12/17/23 0706               Raford Lenis, MD 12/17/23 [phone]

## 2023-12-17 NOTE — ED Notes (Signed)
 Patient transported to X-ray

## 2023-12-17 NOTE — ED Triage Notes (Signed)
 Pt from home states she has been having nausea today without vomiting. Pt states she thinks it is coming from her heart rate going to 53 then back up to normal. Denies any other s/s at time of triage.

## 2023-12-17 NOTE — Discharge Instructions (Addendum)
 For the next 5 days, I want you to take 2 potassium pills at a time twice a day.  After that, reduce it to 1 potassium pill twice a day.  You will need to work with your primary care provider to find the dose of potassium that works for you to keep your potassium level in the safe zone.

## 2023-12-19 LAB — POC URINE PREG, ED: Preg Test, Ur: NEGATIVE

## 2023-12-20 NOTE — Addendum Note (Signed)
 Encounter addended by: Debarah Garrison MATSU, RN on: 12/20/2023 12:15 PM  Actions taken: Imaging Exam ended

## 2023-12-25 ENCOUNTER — Ambulatory Visit (HOSPITAL_COMMUNITY): Payer: Self-pay | Admitting: Cardiology

## 2023-12-28 ENCOUNTER — Other Ambulatory Visit (HOSPITAL_COMMUNITY): Payer: Self-pay

## 2023-12-28 ENCOUNTER — Telehealth: Payer: Self-pay | Admitting: Nurse Practitioner

## 2023-12-28 NOTE — Telephone Encounter (Signed)
   Pt called this evening to report that she was unable to fill her rx for ivabradine  5mg , 1/2 tab bid, b/c it will require prior authorization.  I informed her that I will forward her message to our CHF team and the office will work on the PA tomorrow.  Caller verbalized understanding and was grateful for the call back.  Lonni Meager, NP 12/28/2023, 6:13 PM

## 2023-12-29 ENCOUNTER — Other Ambulatory Visit (HOSPITAL_COMMUNITY): Payer: Self-pay

## 2023-12-29 ENCOUNTER — Telehealth (HOSPITAL_COMMUNITY): Payer: Self-pay | Admitting: Pharmacy Technician

## 2023-12-29 NOTE — Telephone Encounter (Signed)
 Patient Advocate Encounter   Received notification from [employer] that prior authorization for Ivabradine  is required.   PA submitted on CoverMyMeds Key [id number] Status is pending   Will continue to follow.

## 2023-12-29 NOTE — Telephone Encounter (Signed)
 Advanced Heart Failure Patient Advocate Encounter  Prior Authorization for Ivabradine  has been approved.    PA# [id number] Effective dates: 12/29/23 through 06/27/24  Patients co-pay is unable to be seen. Looks like pharmacy refilled RX today.  Almarie JULIANNA Pa, CPhT

## 2024-04-12 ENCOUNTER — Emergency Department (HOSPITAL_COMMUNITY)
Admission: EM | Admit: 2024-04-12 | Discharge: 2024-04-12 | Disposition: A | Discharge status: Home / Self Care | Attending: Emergency Medicine | Admitting: Emergency Medicine

## 2024-04-12 ENCOUNTER — Encounter (HOSPITAL_COMMUNITY): Payer: Self-pay

## 2024-04-12 ENCOUNTER — Other Ambulatory Visit: Payer: Self-pay

## 2024-04-12 ENCOUNTER — Emergency Department (HOSPITAL_COMMUNITY)

## 2024-04-12 DIAGNOSIS — K5900 Constipation, unspecified: Secondary | ICD-10-CM | POA: Diagnosis present

## 2024-04-12 DIAGNOSIS — I509 Heart failure, unspecified: Secondary | ICD-10-CM | POA: Diagnosis not present

## 2024-04-12 MED ORDER — POLYETHYLENE GLYCOL 3350 17 G PO PACK: 17.0000 g | PACK | Freq: Every day | ORAL | 0 refills | Status: AC

## 2024-04-12 NOTE — ED Notes (Signed)
 ED Provider at bedside.

## 2024-04-12 NOTE — ED Triage Notes (Signed)
 Pt arrived via POV c/o constipation X 1 month. Pt reports having to use laxative to have a BM yesterday. Pt denies seeing blood in her stool. Pt reports she does not eat that much fiber or drink that much water either.

## 2024-04-12 NOTE — ED Provider Notes (Signed)
 [place] EMERGENCY DEPARTMENT AT [facility] Provider Note   CSN: [id number] Arrival date & time: 04/12/24  1349     Patient presents with: Constipation   Tina Fletcher is a 38 y.o. female.  She has history of heart failure, though states her recent echo showed normal heart function.  She presents the ER today for evaluation of constipation.  She denies abdominal pain.  States over the past week she has been having less frequent bowel movements, feels like she needs to have 1 but has to strain hard to go, yesterday she took milk of magnesia and had a large bowel movement, but has not been able to have bowel movement today.  She does have hemorrhoids though they are not inflamed, painful or bleeding at this time and does not want them to get worse.  She states she does not drink very much water and does not eat many fruits or vegetables.  She denies having urinary symptoms, no fever chills or other complaints. She states she just wants something to help her have a bowel movement.   Constipation      Prior to Admission medications   Medication Sig Start Date End Date Taking? Authorizing Provider  Buprenorphine HCl-Naloxone HCl (SUBOXONE) 4-1 MG FILM Place 4 Film under the tongue in the morning, at noon, and at bedtime.    [provider]  busPIRone (BUSPAR) 10 MG tablet Take 10 mg by mouth 2 (two) times daily. 11/30/23   [provider]  etonogestrel  (NEXPLANON ) 68 MG IMPL implant Inject 1 each into the skin continuous.    [provider]  ibuprofen  (ADVIL ) 800 MG tablet Take 1 tablet (800 mg total) by mouth every 8 (eight) hours as needed for moderate pain. 03/11/23   Suzette Pac, MD  Iron , Ferrous Sulfate , 325 (65 Fe) MG TABS Take 325 mg by mouth daily. 04/09/21   Danton Jon HERO, PA-C  ivabradine  (CORLANOR ) 5 MG TABS tablet Take 0.5 tablets (2.5 mg total) by mouth 2 (two) times daily with a meal. 11/28/23   Hayes Beckey CROME, NP   lisinopril -hydrochlorothiazide  (ZESTORETIC ) 20-12.5 MG tablet Take 1 tablet by mouth daily. 11/19/23   [provider]  meclizine (ANTIVERT) 12.5 MG tablet Take 12.5 mg by mouth as needed for dizziness.    [provider]  metoprolol  succinate (TOPROL  XL) 25 MG 24 hr tablet Take 0.5 tablets (12.5 mg total) by mouth at bedtime. Patient not taking: Reported on 11/28/2023 04/09/21 04/09/22  Danton Jon HERO, PA-C  omeprazole  (PRILOSEC) 20 MG capsule Take 1 capsule (20 mg total) by mouth daily. 08/31/23   Rolan Ezra RAMAN, MD  ondansetron  (ZOFRAN -ODT) 4 MG disintegrating tablet Take 1 tablet (4 mg total) by mouth every 8 (eight) hours as needed for nausea or vomiting. 12/17/23   Raford Lenis, MD  potassium chloride  SA (KLOR-CON  M) 20 MEQ tablet Take 1 tablet (20 mEq total) by mouth daily. 03/30/22   Rolan Ezra RAMAN, MD  sertraline  (ZOLOFT ) 50 MG tablet Take 1.5 tablets (75 mg total) by mouth daily. Patient not taking: Reported on 11/28/2023 03/30/22   Rolan Ezra RAMAN, MD    Allergies: Fentanyl     Review of Systems  Gastrointestinal:  Positive for constipation.    Updated Vital Signs BP 117/83 (BP Location: Right Arm)   Pulse 94   Temp 98.9 F (37.2 C)   Resp 16   SpO2 98%   Physical Exam Vitals and nursing note reviewed.  Constitutional:  General: She is not in acute distress.    Appearance: She is well-developed.  HENT:     Head: Normocephalic and atraumatic.  Eyes:     Conjunctiva/sclera: Conjunctivae normal.     Pupils: Pupils are equal, round, and reactive to light.  Cardiovascular:     Rate and Rhythm: Normal rate and regular rhythm.     Heart sounds: No murmur heard. Pulmonary:     Effort: Pulmonary effort is normal. No respiratory distress.     Breath sounds: Normal breath sounds.  Abdominal:     Palpations: Abdomen is soft. There is no mass.     Tenderness: There is no abdominal tenderness. There is no guarding or rebound.  Musculoskeletal:         General: No swelling.     Cervical back: Neck supple.     Right lower leg: No edema.     Left lower leg: No edema.  Skin:    General: Skin is warm and dry.     Capillary Refill: Capillary refill takes less than 2 seconds.  Neurological:     General: No focal deficit present.     Mental Status: She is alert and oriented to person, place, and time.  Psychiatric:        Mood and Affect: Mood normal.     (all labs ordered are listed, but only abnormal results are displayed) Labs Reviewed  URINALYSIS, ROUTINE W REFLEX MICROSCOPIC    EKG: None  Radiology: DG Abdomen 1 View Result Date: 04/12/2024 CLINICAL DATA:  constipation EXAM: ABDOMEN - 1 VIEW COMPARISON:  None Available. FINDINGS: Nonobstructive bowel gas pattern.No pneumoperitoneum. No organomegaly or radiopaque calculi. No acute fracture or destructive lesion. The lung bases are clear. IMPRESSION: Nonobstructive bowel gas pattern. No well-formed stool visualized within the colon. Electronically Signed   By: Rogelia Myers M.D.   On: 04/12/2024 15:25     Procedures   Medications Ordered in the ED - No data to display                                  Medical Decision Making Differential diagnosis includes but not limited to constipation, bowel obstruction, other  Course: Patient states she is constipated, had a bowel movement yesterday with the aid of milk of magnesia but not able to vomit today, abdomen soft and nontender, x-ray of abdomen shows normal bowel gas pattern, no obstruction, no f faction.  I interpreted these images independently and I agree with radiology reading.  Records show patient is on buprenorphine which could be contributing to constipation but she reports poor diet and inadequate water intake.  Discussed dietary changes but will give a short course of MiraLAX  and advised on increasing water intake.  She is not tender abdomen and denies vomiting, denies fever or chills.  Do not feel she needs any further  workup at this time.  There is an EKG documented on this patient that showed atrial flutter, but I discussed with nurse, there is a note in the chart this was a different patient's EKG, IT has been contacted to remove this from the patient's chart.  She had no indication for EKG today.  Amount and/or Complexity of Data Reviewed Labs: ordered. Radiology: ordered.  Risk OTC drugs.        Final diagnoses:  None    ED Discharge Orders     None  Suellen Sherran DELENA DEVONNA 04/12/24 1743    Yolande Lamar BROCKS, MD 04/17/24 9088    Yolande Lamar BROCKS, MD 04/24/24 [phone]

## 2024-04-12 NOTE — ED Notes (Signed)
 EKG in this chart belongs to another patient, safety zone completed. Wrong id scanned.

## 2024-04-12 NOTE — Discharge Instructions (Addendum)
 It was a pleasure taking care of you today.  You were evaluated in the emergency department today for constipation.  Start taking the MiraLAX  once a day and make sure you are drinking plenty of water.  Increase your intake of fiber with things like oats, fruits, vegetables, whole grains.  If you develop abdominal pain, vomiting, fever or chills or any other worsening symptoms come back to the ER right away.

## 2024-06-04 ENCOUNTER — Emergency Department (HOSPITAL_COMMUNITY)

## 2024-06-04 ENCOUNTER — Encounter (HOSPITAL_COMMUNITY): Payer: Self-pay | Admitting: *Deleted

## 2024-06-04 ENCOUNTER — Emergency Department (HOSPITAL_COMMUNITY)
Admission: EM | Admit: 2024-06-04 | Discharge: 2024-06-04 | Disposition: A | Discharge status: Home / Self Care | Attending: Emergency Medicine | Admitting: Emergency Medicine

## 2024-06-04 ENCOUNTER — Other Ambulatory Visit: Payer: Self-pay

## 2024-06-04 DIAGNOSIS — Z8679 Personal history of other diseases of the circulatory system: Secondary | ICD-10-CM | POA: Insufficient documentation

## 2024-06-04 DIAGNOSIS — R0789 Other chest pain: Secondary | ICD-10-CM | POA: Insufficient documentation

## 2024-06-04 DIAGNOSIS — I509 Heart failure, unspecified: Secondary | ICD-10-CM | POA: Insufficient documentation

## 2024-06-04 DIAGNOSIS — I110 Hypertensive heart disease with heart failure: Secondary | ICD-10-CM | POA: Insufficient documentation

## 2024-06-04 DIAGNOSIS — I5022 Chronic systolic (congestive) heart failure: Secondary | ICD-10-CM

## 2024-06-04 LAB — CBC
HCT: 36.8 % (ref 36.0–46.0)
Hemoglobin: 11.9 g/dL — ABNORMAL LOW (ref 12.0–15.0)
MCH: 27.4 pg (ref 26.0–34.0)
MCHC: 32.3 g/dL (ref 30.0–36.0)
MCV: 84.6 fL (ref 80.0–100.0)
Platelets: 187 K/uL (ref 150–400)
RBC: 4.35 MIL/uL (ref 3.87–5.11)
RDW: 12.7 % (ref 11.5–15.5)
WBC: 4.7 K/uL (ref 4.0–10.5)
nRBC: 0 % (ref 0.0–0.2)

## 2024-06-04 LAB — BASIC METABOLIC PANEL WITH GFR
Anion gap: 8 (ref 5–15)
BUN: 10 mg/dL (ref 6–20)
CO2: 28 mmol/L (ref 22–32)
Calcium: 9.4 mg/dL (ref 8.9–10.3)
Chloride: 102 mmol/L (ref 98–111)
Creatinine, Ser: 0.83 mg/dL (ref 0.44–1.00)
GFR, Estimated: >60 mL/min (ref >60)
Glucose, Bld: 117 mg/dL — ABNORMAL HIGH (ref 70–99)
Potassium: 3 mmol/L — ABNORMAL LOW (ref 3.5–5.1)
Sodium: 138 mmol/L (ref 135–145)

## 2024-06-04 LAB — TROPONIN T, HIGH SENSITIVITY: Troponin T High Sensitivity: <15 ng/L (ref 0–19)

## 2024-06-04 MED ORDER — IVABRADINE HCL 5 MG PO TABS: 2.5000 mg | ORAL_TABLET | Freq: Two times a day (BID) | ORAL | 0 refills | Status: DC

## 2024-06-04 NOTE — ED Notes (Signed)
 Discharge instructions reviewed.   Newly prescribed medications discussed. Pharmacy verified.   Opportunity for questions and concerns provided.   Alert, oriented and ambulatory.   Displays no signs of distress.

## 2024-06-04 NOTE — Discharge Instructions (Signed)
 Resume taking ivabradine  as before.  This medication has been refilled for you with a prescription sent to your pharmacy.  Follow-up with your cardiology if symptoms persist, and return to the ER if symptoms significantly worsen or change.

## 2024-06-04 NOTE — ED Triage Notes (Signed)
 Pt c/o chest pain that started x 30 mins ago while laying in bed  Pt states the pain has resolved; pt states she took 2 tylenol  and metoprolol  and the pain is gone; pt states she took those meds 20 mins ago  Pt describes the pain as achy when it started

## 2024-06-04 NOTE — ED Provider Notes (Signed)
 [place] EMERGENCY DEPARTMENT AT [facility] Provider Note   CSN: [id number] Arrival date & time: 06/04/24  9681     Patient presents with: Chest Pain   Tina Fletcher is a 38 y.o. female.   Patient is a 38 year old female with history of nonspecific cardiomyopathy, CHF, hypertension, anxiety.  Patient presenting today with complaints of chest discomfort.  Symptoms began approximately 1 hour ago.  She describes a tightness in the center of her chest with no associated nausea, diaphoresis, or shortness of breath.  Symptoms resolved prior to arrival to the ER.  Patient has been seen on multiple occasions in the past with similar complaints, but nothing emergent was found.  She had a heart cath in 2025 showing no significant disease.       Prior to Admission medications   Medication Sig Start Date End Date Taking? Authorizing Provider  Buprenorphine HCl-Naloxone HCl (SUBOXONE) 4-1 MG FILM Place 4 Film under the tongue in the morning, at noon, and at bedtime.    [provider]  busPIRone (BUSPAR) 10 MG tablet Take 10 mg by mouth 2 (two) times daily. 11/30/23   [provider]  etonogestrel  (NEXPLANON ) 68 MG IMPL implant Inject 1 each into the skin continuous.    [provider]  ibuprofen  (ADVIL ) 800 MG tablet Take 1 tablet (800 mg total) by mouth every 8 (eight) hours as needed for moderate pain. 03/11/23   Suzette Pac, MD  Iron , Ferrous Sulfate , 325 (65 Fe) MG TABS Take 325 mg by mouth daily. 04/09/21   Danton Jon HERO, PA-C  ivabradine  (CORLANOR ) 5 MG TABS tablet Take 0.5 tablets (2.5 mg total) by mouth 2 (two) times daily with a meal. 11/28/23   Hayes Beckey CROME, NP  lisinopril -hydrochlorothiazide  (ZESTORETIC ) 20-12.5 MG tablet Take 1 tablet by mouth daily. 11/19/23   [provider]  meclizine (ANTIVERT) 12.5 MG tablet Take 12.5 mg by mouth as needed for dizziness.    [provider]  metoprolol  succinate (TOPROL  XL) 25 MG 24 hr  tablet Take 0.5 tablets (12.5 mg total) by mouth at bedtime. Patient not taking: Reported on 11/28/2023 04/09/21 04/09/22  Danton Jon HERO, PA-C  omeprazole  (PRILOSEC) 20 MG capsule Take 1 capsule (20 mg total) by mouth daily. 08/31/23   Rolan Ezra RAMAN, MD  ondansetron  (ZOFRAN -ODT) 4 MG disintegrating tablet Take 1 tablet (4 mg total) by mouth every 8 (eight) hours as needed for nausea or vomiting. 12/17/23   Raford Lenis, MD  polyethylene glycol (MIRALAX ) 17 g packet Take 17 g by mouth daily. 04/12/24   Beatty, Celeste A, PA-C  potassium chloride  SA (KLOR-CON  M) 20 MEQ tablet Take 1 tablet (20 mEq total) by mouth daily. 03/30/22   Rolan Ezra RAMAN, MD  sertraline  (ZOLOFT ) 50 MG tablet Take 1.5 tablets (75 mg total) by mouth daily. Patient not taking: Reported on 11/28/2023 03/30/22   Rolan Ezra RAMAN, MD    Allergies: Fentanyl     Review of Systems  All other systems reviewed and are negative.   Updated Vital Signs BP 121/86   Pulse (!) 110   Temp 97.9 F (36.6 C) (Oral)   Resp 19   Ht 5' 9 (1.753 m)   Wt 60.3 kg   SpO2 100%   BMI 19.64 kg/m   Physical Exam Vitals and nursing note reviewed.  Constitutional:      General: She is not in acute distress.    Appearance: She is well-developed. She is not diaphoretic.  HENT:  Head: Normocephalic and atraumatic.  Cardiovascular:     Rate and Rhythm: Normal rate and regular rhythm.     Heart sounds: No murmur heard.    No friction rub. No gallop.  Pulmonary:     Effort: Pulmonary effort is normal. No respiratory distress.     Breath sounds: Normal breath sounds. No wheezing.  Abdominal:     General: Bowel sounds are normal. There is no distension.     Palpations: Abdomen is soft.     Tenderness: There is no abdominal tenderness.  Musculoskeletal:        General: Normal range of motion.     Cervical back: Normal range of motion and neck supple.  Skin:    General: Skin is warm and dry.  Neurological:     General: No focal  deficit present.     Mental Status: She is alert and oriented to person, place, and time.     (all labs ordered are listed, but only abnormal results are displayed) Labs Reviewed  BASIC METABOLIC PANEL WITH GFR  CBC  TROPONIN T, HIGH SENSITIVITY    EKG: EKG Interpretation Date/Time:  Monday June 04 2024 03:30:39 EST Ventricular Rate:  110 PR Interval:  173 QRS Duration:  80 QT Interval:  350 QTC Calculation: 474 R Axis:   61  Text Interpretation: Sinus tachycardia Biatrial enlargement Borderline repolarization abnormality No significant change since Confirmed by Geroldine Berg ([id number]) on 06/04/2024 3:33:13 AM  Radiology: No results found.   Procedures   Medications Ordered in the ED - No data to display                                  Medical Decision Making Amount and/or Complexity of Data Reviewed Labs: ordered. Radiology: ordered.   Patient is a 38 year old female with history of cardiomyopathy and CHF believed to be related to alcohol abuse in the past.  Patient presenting with chest discomfort that started just prior to arrival.  Patient arrives here with stable vital signs and is afebrile.  Physical examination is unremarkable.  Laboratory studies obtained including CBC, metabolic panel, and troponin, all of which are unremarkable.  Chest x-ray showing no acute process.  Patient has been observed here for 2 hours and has been pain-free throughout her stay.  Patient has had heart cath approximately 5 years ago showing no significant lesions.  I highly doubt a cardiac etiology to her discomfort.  She has had multiple workups in the past, but nothing has been found.  She does tell me that she is out of her ivabradine  prescription and is concerned that this may be the cause.  I will refill this for her for the next month until she can talk to her prescribing physician.     Final diagnoses:  None    ED Discharge Orders     None          Geroldine Berg, MD 06/04/24 [phone]

## 2024-07-19 ENCOUNTER — Other Ambulatory Visit (HOSPITAL_COMMUNITY): Payer: Self-pay | Admitting: Cardiology

## 2024-07-19 DIAGNOSIS — I5022 Chronic systolic (congestive) heart failure: Secondary | ICD-10-CM

## 2024-07-19 MED ORDER — IVABRADINE HCL 5 MG PO TABS: 2.5000 mg | ORAL_TABLET | Freq: Two times a day (BID) | ORAL | 3 refills | Status: AC
